# Patient Record
Sex: Female | Born: 1935 | Race: White | Hispanic: No | Smoking: Former smoker
Health system: Southern US, Community
[De-identification: ages and names within clinical notes are randomized; demographics above are authoritative.]

## PROBLEM LIST (undated history)

## (undated) DIAGNOSIS — R413 Other amnesia: Secondary | ICD-10-CM

## (undated) DIAGNOSIS — C801 Malignant (primary) neoplasm, unspecified: Secondary | ICD-10-CM

## (undated) DIAGNOSIS — H9190 Unspecified hearing loss, unspecified ear: Secondary | ICD-10-CM

## (undated) DIAGNOSIS — K589 Irritable bowel syndrome without diarrhea: Secondary | ICD-10-CM

## (undated) DIAGNOSIS — F419 Anxiety disorder, unspecified: Secondary | ICD-10-CM

## (undated) DIAGNOSIS — F329 Major depressive disorder, single episode, unspecified: Secondary | ICD-10-CM

## (undated) DIAGNOSIS — M810 Age-related osteoporosis without current pathological fracture: Secondary | ICD-10-CM

## (undated) DIAGNOSIS — I10 Essential (primary) hypertension: Secondary | ICD-10-CM

## (undated) DIAGNOSIS — C50919 Malignant neoplasm of unspecified site of unspecified female breast: Secondary | ICD-10-CM

## (undated) DIAGNOSIS — F32A Depression, unspecified: Secondary | ICD-10-CM

## (undated) DIAGNOSIS — M1990 Unspecified osteoarthritis, unspecified site: Secondary | ICD-10-CM

## (undated) DIAGNOSIS — Z972 Presence of dental prosthetic device (complete) (partial): Secondary | ICD-10-CM

## (undated) DIAGNOSIS — Z923 Personal history of irradiation: Secondary | ICD-10-CM

## (undated) DIAGNOSIS — E785 Hyperlipidemia, unspecified: Secondary | ICD-10-CM

## (undated) HISTORY — DX: Major depressive disorder, single episode, unspecified: F32.9

## (undated) HISTORY — PX: COLON SURGERY: SHX602

## (undated) HISTORY — DX: Depression, unspecified: F32.A

## (undated) HISTORY — DX: Malignant (primary) neoplasm, unspecified: C80.1

## (undated) HISTORY — DX: Essential (primary) hypertension: I10

## (undated) HISTORY — PX: BREAST SURGERY: SHX581

## (undated) HISTORY — PX: TUBAL LIGATION: SHX77

## (undated) HISTORY — PX: APPENDECTOMY: SHX54

## (undated) HISTORY — DX: Anxiety disorder, unspecified: F41.9

---

## 2004-11-30 ENCOUNTER — Ambulatory Visit: Payer: Self-pay | Admitting: Family Medicine

## 2007-02-17 ENCOUNTER — Ambulatory Visit: Payer: Self-pay | Admitting: Family Medicine

## 2007-03-17 ENCOUNTER — Emergency Department: Payer: Self-pay | Admitting: Emergency Medicine

## 2007-03-18 ENCOUNTER — Other Ambulatory Visit: Payer: Self-pay

## 2007-09-10 ENCOUNTER — Ambulatory Visit: Payer: Self-pay | Admitting: Family Medicine

## 2008-04-15 ENCOUNTER — Emergency Department: Payer: Self-pay | Admitting: Emergency Medicine

## 2009-07-20 ENCOUNTER — Ambulatory Visit: Payer: Self-pay | Admitting: Ophthalmology

## 2009-08-02 ENCOUNTER — Ambulatory Visit: Payer: Self-pay | Admitting: Ophthalmology

## 2009-11-18 ENCOUNTER — Observation Stay: Payer: Self-pay | Admitting: Internal Medicine

## 2010-01-05 ENCOUNTER — Ambulatory Visit: Payer: Self-pay | Admitting: Internal Medicine

## 2010-02-04 ENCOUNTER — Emergency Department: Payer: Self-pay | Admitting: Unknown Physician Specialty

## 2010-02-14 ENCOUNTER — Ambulatory Visit: Payer: Self-pay | Admitting: Internal Medicine

## 2010-02-22 ENCOUNTER — Observation Stay: Payer: Self-pay | Admitting: Internal Medicine

## 2010-03-16 ENCOUNTER — Ambulatory Visit: Payer: Self-pay | Admitting: Vascular Surgery

## 2010-04-05 ENCOUNTER — Ambulatory Visit: Payer: Self-pay | Admitting: Vascular Surgery

## 2010-04-12 ENCOUNTER — Inpatient Hospital Stay: Payer: Self-pay | Admitting: Vascular Surgery

## 2010-04-13 LAB — PATHOLOGY REPORT

## 2010-04-14 ENCOUNTER — Observation Stay: Payer: Self-pay | Admitting: Vascular Surgery

## 2010-06-27 ENCOUNTER — Ambulatory Visit: Payer: Self-pay | Admitting: Family Medicine

## 2010-07-17 ENCOUNTER — Ambulatory Visit: Payer: Self-pay | Admitting: Family Medicine

## 2010-12-04 ENCOUNTER — Ambulatory Visit: Payer: Self-pay | Admitting: Gastroenterology

## 2011-03-05 ENCOUNTER — Ambulatory Visit: Payer: Self-pay | Admitting: Gastroenterology

## 2011-03-06 LAB — PATHOLOGY REPORT

## 2011-03-16 ENCOUNTER — Ambulatory Visit: Payer: Self-pay

## 2011-04-12 ENCOUNTER — Emergency Department: Payer: Self-pay | Admitting: *Deleted

## 2011-05-01 ENCOUNTER — Ambulatory Visit: Payer: Self-pay | Admitting: Surgery

## 2011-05-08 ENCOUNTER — Inpatient Hospital Stay: Payer: Self-pay | Admitting: Surgery

## 2011-05-15 LAB — PATHOLOGY REPORT

## 2012-01-02 ENCOUNTER — Other Ambulatory Visit: Payer: Self-pay | Admitting: Gastroenterology

## 2012-01-02 LAB — CLOSTRIDIUM DIFFICILE BY PCR

## 2012-01-04 LAB — STOOL CULTURE

## 2012-02-18 ENCOUNTER — Ambulatory Visit: Payer: Self-pay | Admitting: Family Medicine

## 2012-02-21 ENCOUNTER — Ambulatory Visit: Payer: Self-pay | Admitting: Family Medicine

## 2012-03-03 ENCOUNTER — Ambulatory Visit: Payer: Self-pay | Admitting: Family Medicine

## 2012-04-11 ENCOUNTER — Other Ambulatory Visit: Payer: Self-pay | Admitting: Gastroenterology

## 2012-09-10 DIAGNOSIS — C50919 Malignant neoplasm of unspecified site of unspecified female breast: Secondary | ICD-10-CM

## 2012-09-10 HISTORY — DX: Malignant neoplasm of unspecified site of unspecified female breast: C50.919

## 2012-09-10 HISTORY — PX: BREAST BIOPSY: SHX20

## 2013-03-18 ENCOUNTER — Ambulatory Visit: Payer: Self-pay | Admitting: Family Medicine

## 2013-03-31 ENCOUNTER — Ambulatory Visit: Payer: Self-pay | Admitting: Surgery

## 2013-04-08 ENCOUNTER — Ambulatory Visit: Payer: Self-pay | Admitting: Oncology

## 2013-04-09 LAB — PATHOLOGY REPORT

## 2013-04-10 ENCOUNTER — Ambulatory Visit: Payer: Self-pay | Admitting: Oncology

## 2013-04-17 ENCOUNTER — Ambulatory Visit: Payer: Self-pay | Admitting: Oncology

## 2013-04-29 ENCOUNTER — Ambulatory Visit: Payer: Self-pay | Admitting: Oncology

## 2013-05-02 ENCOUNTER — Ambulatory Visit: Payer: Self-pay

## 2013-05-02 LAB — URINALYSIS, COMPLETE
Bilirubin,UR: NEGATIVE
Ketone: NEGATIVE
Nitrite: POSITIVE
Ph: 5 (ref 4.5–8.0)
Protein: 100
Specific Gravity: >=1.03 (ref 1.003–1.030)

## 2013-05-11 ENCOUNTER — Ambulatory Visit: Payer: Self-pay | Admitting: Oncology

## 2013-05-11 HISTORY — PX: BREAST LUMPECTOMY: SHX2

## 2013-05-14 ENCOUNTER — Ambulatory Visit: Payer: Self-pay | Admitting: Surgery

## 2013-05-14 LAB — CBC WITH DIFFERENTIAL/PLATELET
Basophil #: 0.1 10*3/uL (ref 0.0–0.1)
Basophil %: 1 %
HGB: 13.7 g/dL (ref 12.0–16.0)
Lymphocyte %: 37.2 %
MCH: 30.3 pg (ref 26.0–34.0)
Monocyte #: 0.8 x10 3/mm (ref 0.2–0.9)
Monocyte %: 10 %
Neutrophil #: 3.8 10*3/uL (ref 1.4–6.5)
Neutrophil %: 46.5 %
Platelet: 284 10*3/uL (ref 150–440)
RBC: 4.53 10*6/uL (ref 3.80–5.20)
RDW: 14.3 % (ref 11.5–14.5)
WBC: 8.1 10*3/uL (ref 3.6–11.0)

## 2013-05-14 LAB — BASIC METABOLIC PANEL
BUN: 13 mg/dL (ref 7–18)
Chloride: 106 mmol/L (ref 98–107)
Co2: 27 mmol/L (ref 21–32)
Creatinine: 0.74 mg/dL (ref 0.60–1.30)
EGFR (Non-African Amer.): >60
Sodium: 138 mmol/L (ref 136–145)

## 2013-05-20 ENCOUNTER — Ambulatory Visit: Payer: Self-pay | Admitting: Surgery

## 2013-06-10 ENCOUNTER — Ambulatory Visit: Payer: Self-pay | Admitting: Oncology

## 2013-07-11 ENCOUNTER — Ambulatory Visit: Payer: Self-pay | Admitting: Oncology

## 2013-07-29 LAB — CBC CANCER CENTER
Basophil %: 1.3 %
Eosinophil #: 0.3 x10 3/mm (ref 0.0–0.7)
HCT: 39.5 % (ref 35.0–47.0)
HGB: 12.9 g/dL (ref 12.0–16.0)
Lymphocyte %: 33.6 %
MCH: 29.7 pg (ref 26.0–34.0)
MCHC: 32.6 g/dL (ref 32.0–36.0)
MCV: 91 fL (ref 80–100)
Monocyte #: 0.6 x10 3/mm (ref 0.2–0.9)
Monocyte %: 9.3 %
Neutrophil #: 3.3 x10 3/mm (ref 1.4–6.5)
Platelet: 257 x10 3/mm (ref 150–440)
RBC: 4.33 10*6/uL (ref 3.80–5.20)
RDW: 13.9 % (ref 11.5–14.5)

## 2013-08-10 ENCOUNTER — Ambulatory Visit: Payer: Self-pay | Admitting: Oncology

## 2013-08-19 LAB — CBC CANCER CENTER
Eosinophil %: 6.6 %
HCT: 37.8 % (ref 35.0–47.0)
HGB: 12.5 g/dL (ref 12.0–16.0)
Lymphocyte #: 1.8 x10 3/mm (ref 1.0–3.6)
Lymphocyte %: 27.7 %
MCH: 29.8 pg (ref 26.0–34.0)
MCHC: 33.2 g/dL (ref 32.0–36.0)
MCV: 90 fL (ref 80–100)
Monocyte #: 0.8 x10 3/mm (ref 0.2–0.9)
Monocyte %: 11.7 %
Neutrophil %: 53.2 %
Platelet: 245 x10 3/mm (ref 150–440)
RBC: 4.21 10*6/uL (ref 3.80–5.20)
RDW: 14 % (ref 11.5–14.5)
WBC: 6.6 x10 3/mm (ref 3.6–11.0)

## 2013-08-26 LAB — CBC CANCER CENTER
Basophil #: 0.2 x10 3/mm — ABNORMAL HIGH (ref 0.0–0.1)
Eosinophil #: 0.4 x10 3/mm (ref 0.0–0.7)
Eosinophil %: 5.7 %
HCT: 39.4 % (ref 35.0–47.0)
HGB: 13.2 g/dL (ref 12.0–16.0)
Lymphocyte %: 26.8 %
MCH: 30.1 pg (ref 26.0–34.0)
MCHC: 33.4 g/dL (ref 32.0–36.0)
MCV: 90 fL (ref 80–100)
Monocyte #: 0.8 x10 3/mm (ref 0.2–0.9)
Neutrophil #: 3.8 x10 3/mm (ref 1.4–6.5)

## 2013-09-02 LAB — CBC CANCER CENTER
Basophil #: 0.1 x10 3/mm (ref 0.0–0.1)
Basophil %: 1.4 %
Eosinophil #: 0.4 x10 3/mm (ref 0.0–0.7)
Eosinophil %: 5.8 %
HCT: 39.5 % (ref 35.0–47.0)
Lymphocyte #: 1.4 x10 3/mm (ref 1.0–3.6)
Lymphocyte %: 22.2 %
MCH: 30.1 pg (ref 26.0–34.0)
MCV: 90 fL (ref 80–100)
Monocyte #: 0.6 x10 3/mm (ref 0.2–0.9)
Neutrophil #: 3.8 x10 3/mm (ref 1.4–6.5)
Neutrophil %: 61.3 %
Platelet: 257 x10 3/mm (ref 150–440)
RBC: 4.4 10*6/uL (ref 3.80–5.20)
WBC: 6.2 x10 3/mm (ref 3.6–11.0)

## 2013-09-10 ENCOUNTER — Ambulatory Visit: Payer: Self-pay | Admitting: Oncology

## 2013-10-11 ENCOUNTER — Ambulatory Visit: Payer: Self-pay | Admitting: Oncology

## 2013-11-26 ENCOUNTER — Emergency Department: Payer: Self-pay | Admitting: Internal Medicine

## 2014-01-11 ENCOUNTER — Ambulatory Visit: Payer: Self-pay | Admitting: Oncology

## 2014-02-08 ENCOUNTER — Ambulatory Visit: Payer: Self-pay | Admitting: Oncology

## 2014-02-22 ENCOUNTER — Emergency Department: Payer: Self-pay | Admitting: Emergency Medicine

## 2014-02-22 LAB — CBC WITH DIFFERENTIAL/PLATELET
Basophil #: 0.1 10*3/uL (ref 0.0–0.1)
Basophil %: 0.8 %
Eosinophil #: 0.1 10*3/uL (ref 0.0–0.7)
Eosinophil %: 1.6 %
HCT: 41.1 % (ref 35.0–47.0)
HGB: 13.6 g/dL (ref 12.0–16.0)
Lymphocyte #: 1.3 10*3/uL (ref 1.0–3.6)
Lymphocyte %: 15.6 %
MCH: 29.6 pg (ref 26.0–34.0)
MCHC: 33.2 g/dL (ref 32.0–36.0)
MCV: 89 fL (ref 80–100)
Monocyte #: 0.6 x10 3/mm (ref 0.2–0.9)
Monocyte %: 6.8 %
NEUTROS PCT: 75.2 %
Neutrophil #: 6.2 10*3/uL (ref 1.4–6.5)
Platelet: 279 10*3/uL (ref 150–440)
RBC: 4.61 10*6/uL (ref 3.80–5.20)
RDW: 14.2 % (ref 11.5–14.5)
WBC: 8.3 10*3/uL (ref 3.6–11.0)

## 2014-02-22 LAB — BASIC METABOLIC PANEL
Anion Gap: 9 (ref 7–16)
BUN: 14 mg/dL (ref 7–18)
CHLORIDE: 103 mmol/L (ref 98–107)
CO2: 27 mmol/L (ref 21–32)
CREATININE: 0.92 mg/dL (ref 0.60–1.30)
Calcium, Total: 9.2 mg/dL (ref 8.5–10.1)
EGFR (African American): >60
EGFR (Non-African Amer.): 60 — ABNORMAL LOW
GLUCOSE: 155 mg/dL — AB (ref 65–99)
Osmolality: 281 (ref 275–301)
Potassium: 4.6 mmol/L (ref 3.5–5.1)
SODIUM: 139 mmol/L (ref 136–145)

## 2014-02-22 LAB — TROPONIN I: Troponin-I: <0.02 ng/mL

## 2014-02-23 LAB — URINALYSIS, COMPLETE
Bacteria: NONE SEEN
Bilirubin,UR: NEGATIVE
Blood: NEGATIVE
Glucose,UR: NEGATIVE mg/dL (ref 0–75)
Ketone: NEGATIVE
LEUKOCYTE ESTERASE: NEGATIVE
Nitrite: NEGATIVE
Ph: 5 (ref 4.5–8.0)
Protein: NEGATIVE
RBC,UR: 1 /HPF (ref 0–5)
SPECIFIC GRAVITY: 1.026 (ref 1.003–1.030)

## 2014-03-18 ENCOUNTER — Ambulatory Visit: Payer: Self-pay | Admitting: Oncology

## 2014-04-14 ENCOUNTER — Ambulatory Visit: Payer: Self-pay | Admitting: Oncology

## 2014-04-29 ENCOUNTER — Inpatient Hospital Stay: Payer: Self-pay | Admitting: Internal Medicine

## 2014-04-29 LAB — URINALYSIS, COMPLETE
BILIRUBIN, UR: NEGATIVE
Blood: NEGATIVE
GLUCOSE, UR: NEGATIVE mg/dL (ref 0–75)
Ketone: NEGATIVE
Nitrite: NEGATIVE
PH: 5 (ref 4.5–8.0)
RBC,UR: 3 /HPF (ref 0–5)
SPECIFIC GRAVITY: 1.023 (ref 1.003–1.030)
WBC UR: 3 /HPF (ref 0–5)

## 2014-04-29 LAB — CBC
HCT: 33.1 % — ABNORMAL LOW (ref 35.0–47.0)
HGB: 11 g/dL — ABNORMAL LOW (ref 12.0–16.0)
MCH: 28.6 pg (ref 26.0–34.0)
MCHC: 33.3 g/dL (ref 32.0–36.0)
MCV: 86 fL (ref 80–100)
Platelet: 396 10*3/uL (ref 150–440)
RBC: 3.85 10*6/uL (ref 3.80–5.20)
RDW: 14.2 % (ref 11.5–14.5)
WBC: 9 10*3/uL (ref 3.6–11.0)

## 2014-04-29 LAB — COMPREHENSIVE METABOLIC PANEL
Albumin: 2.3 g/dL — ABNORMAL LOW (ref 3.4–5.0)
Alkaline Phosphatase: 143 U/L — ABNORMAL HIGH
Anion Gap: 9 (ref 7–16)
BUN: 11 mg/dL (ref 7–18)
Bilirubin,Total: 0.3 mg/dL (ref 0.2–1.0)
CALCIUM: 8.6 mg/dL (ref 8.5–10.1)
CHLORIDE: 105 mmol/L (ref 98–107)
Co2: 25 mmol/L (ref 21–32)
Creatinine: 0.76 mg/dL (ref 0.60–1.30)
EGFR (African American): >60
EGFR (Non-African Amer.): >60
GLUCOSE: 115 mg/dL — AB (ref 65–99)
OSMOLALITY: 278 (ref 275–301)
POTASSIUM: 3.8 mmol/L (ref 3.5–5.1)
SGOT(AST): 36 U/L (ref 15–37)
SGPT (ALT): 53 U/L
SODIUM: 139 mmol/L (ref 136–145)
Total Protein: 7 g/dL (ref 6.4–8.2)

## 2014-04-29 LAB — TROPONIN I

## 2014-04-30 LAB — CBC WITH DIFFERENTIAL/PLATELET
BASOS ABS: 0.1 10*3/uL (ref 0.0–0.1)
Basophil %: 1.2 %
EOS PCT: 5.4 %
Eosinophil #: 0.5 10*3/uL (ref 0.0–0.7)
HCT: 32 % — ABNORMAL LOW (ref 35.0–47.0)
HGB: 10.8 g/dL — AB (ref 12.0–16.0)
Lymphocyte #: 1.2 10*3/uL (ref 1.0–3.6)
Lymphocyte %: 13.8 %
MCH: 28.8 pg (ref 26.0–34.0)
MCHC: 33.8 g/dL (ref 32.0–36.0)
MCV: 85 fL (ref 80–100)
MONO ABS: 1 x10 3/mm — AB (ref 0.2–0.9)
MONOS PCT: 12 %
Neutrophil #: 5.8 10*3/uL (ref 1.4–6.5)
Neutrophil %: 67.6 %
Platelet: 407 10*3/uL (ref 150–440)
RBC: 3.75 10*6/uL — ABNORMAL LOW (ref 3.80–5.20)
RDW: 13.9 % (ref 11.5–14.5)
WBC: 8.5 10*3/uL (ref 3.6–11.0)

## 2014-04-30 LAB — BASIC METABOLIC PANEL
Anion Gap: 9 (ref 7–16)
BUN: 10 mg/dL (ref 7–18)
CALCIUM: 8.5 mg/dL (ref 8.5–10.1)
CHLORIDE: 105 mmol/L (ref 98–107)
CO2: 24 mmol/L (ref 21–32)
CREATININE: 0.63 mg/dL (ref 0.60–1.30)
EGFR (African American): >60
Glucose: 97 mg/dL (ref 65–99)
Osmolality: 275 (ref 275–301)
POTASSIUM: 4.7 mmol/L (ref 3.5–5.1)
Sodium: 138 mmol/L (ref 136–145)

## 2014-05-02 LAB — EXPECTORATED SPUTUM ASSESSMENT W REFEX TO RESP CULTURE

## 2014-05-04 LAB — CULTURE, BLOOD (SINGLE)

## 2014-05-11 ENCOUNTER — Ambulatory Visit: Payer: Self-pay | Admitting: Family Medicine

## 2014-05-11 ENCOUNTER — Ambulatory Visit: Payer: Self-pay | Admitting: Oncology

## 2014-06-10 ENCOUNTER — Ambulatory Visit: Payer: Self-pay | Admitting: Oncology

## 2014-09-28 ENCOUNTER — Ambulatory Visit: Payer: Self-pay | Admitting: Family Medicine

## 2014-09-28 DIAGNOSIS — R918 Other nonspecific abnormal finding of lung field: Secondary | ICD-10-CM | POA: Diagnosis not present

## 2014-09-28 DIAGNOSIS — R05 Cough: Secondary | ICD-10-CM | POA: Diagnosis not present

## 2014-09-28 DIAGNOSIS — I517 Cardiomegaly: Secondary | ICD-10-CM | POA: Diagnosis not present

## 2014-10-13 ENCOUNTER — Ambulatory Visit: Payer: Self-pay | Admitting: Family Medicine

## 2014-10-13 DIAGNOSIS — J984 Other disorders of lung: Secondary | ICD-10-CM | POA: Diagnosis not present

## 2014-10-13 DIAGNOSIS — R918 Other nonspecific abnormal finding of lung field: Secondary | ICD-10-CM | POA: Diagnosis not present

## 2014-10-13 DIAGNOSIS — I700 Atherosclerosis of aorta: Secondary | ICD-10-CM | POA: Diagnosis not present

## 2014-10-13 DIAGNOSIS — R05 Cough: Secondary | ICD-10-CM | POA: Diagnosis not present

## 2014-10-13 DIAGNOSIS — Z01812 Encounter for preprocedural laboratory examination: Secondary | ICD-10-CM | POA: Diagnosis not present

## 2014-10-13 LAB — CREATININE, SERUM
Creatinine: 1.06 mg/dL (ref 0.60–1.30)
EGFR (African American): >60
EGFR (Non-African Amer.): 53 — ABNORMAL LOW

## 2014-10-15 ENCOUNTER — Ambulatory Visit: Payer: Self-pay | Admitting: Internal Medicine

## 2014-10-15 DIAGNOSIS — R918 Other nonspecific abnormal finding of lung field: Secondary | ICD-10-CM | POA: Diagnosis not present

## 2014-10-15 DIAGNOSIS — F419 Anxiety disorder, unspecified: Secondary | ICD-10-CM | POA: Diagnosis not present

## 2014-10-15 DIAGNOSIS — J439 Emphysema, unspecified: Secondary | ICD-10-CM | POA: Diagnosis not present

## 2014-10-15 DIAGNOSIS — C50911 Malignant neoplasm of unspecified site of right female breast: Secondary | ICD-10-CM | POA: Diagnosis not present

## 2014-10-15 DIAGNOSIS — Z170 Estrogen receptor positive status [ER+]: Secondary | ICD-10-CM | POA: Diagnosis not present

## 2014-10-15 DIAGNOSIS — Z79899 Other long term (current) drug therapy: Secondary | ICD-10-CM | POA: Diagnosis not present

## 2014-10-15 DIAGNOSIS — Z79811 Long term (current) use of aromatase inhibitors: Secondary | ICD-10-CM | POA: Diagnosis not present

## 2014-10-15 DIAGNOSIS — C50912 Malignant neoplasm of unspecified site of left female breast: Secondary | ICD-10-CM | POA: Diagnosis not present

## 2014-10-15 DIAGNOSIS — I10 Essential (primary) hypertension: Secondary | ICD-10-CM | POA: Diagnosis not present

## 2014-10-15 LAB — CBC CANCER CENTER
BASOS ABS: 0.1 x10 3/mm (ref 0.0–0.1)
Basophil %: 0.7 %
EOS PCT: 4.5 %
Eosinophil #: 0.4 x10 3/mm (ref 0.0–0.7)
HCT: 36.7 % (ref 35.0–47.0)
HGB: 11.8 g/dL — AB (ref 12.0–16.0)
LYMPHS PCT: 30.8 %
Lymphocyte #: 2.9 x10 3/mm (ref 1.0–3.6)
MCH: 25 pg — ABNORMAL LOW (ref 26.0–34.0)
MCHC: 32.2 g/dL (ref 32.0–36.0)
MCV: 78 fL — AB (ref 80–100)
MONOS PCT: 7.1 %
Monocyte #: 0.7 x10 3/mm (ref 0.2–0.9)
NEUTROS PCT: 56.9 %
Neutrophil #: 5.3 x10 3/mm (ref 1.4–6.5)
PLATELETS: 328 x10 3/mm (ref 150–440)
RBC: 4.73 10*6/uL (ref 3.80–5.20)
RDW: 18.2 % — ABNORMAL HIGH (ref 11.5–14.5)
WBC: 9.4 x10 3/mm (ref 3.6–11.0)

## 2014-10-15 LAB — PROTIME-INR
INR: 1
Prothrombin Time: 12.7 secs

## 2014-10-16 LAB — CANCER ANTIGEN 27.29: CA 27.29: 22.9 U/mL (ref 0.0–38.6)

## 2014-10-20 ENCOUNTER — Ambulatory Visit: Payer: Self-pay | Admitting: Internal Medicine

## 2014-10-20 DIAGNOSIS — J984 Other disorders of lung: Secondary | ICD-10-CM | POA: Diagnosis not present

## 2014-10-20 DIAGNOSIS — Z853 Personal history of malignant neoplasm of breast: Secondary | ICD-10-CM | POA: Diagnosis not present

## 2014-10-20 DIAGNOSIS — R590 Localized enlarged lymph nodes: Secondary | ICD-10-CM | POA: Diagnosis not present

## 2014-10-20 DIAGNOSIS — R918 Other nonspecific abnormal finding of lung field: Secondary | ICD-10-CM | POA: Diagnosis not present

## 2014-10-20 DIAGNOSIS — K8020 Calculus of gallbladder without cholecystitis without obstruction: Secondary | ICD-10-CM | POA: Diagnosis not present

## 2014-10-20 DIAGNOSIS — C50919 Malignant neoplasm of unspecified site of unspecified female breast: Secondary | ICD-10-CM | POA: Diagnosis not present

## 2014-10-22 DIAGNOSIS — C50911 Malignant neoplasm of unspecified site of right female breast: Secondary | ICD-10-CM | POA: Diagnosis not present

## 2014-10-22 DIAGNOSIS — Z79811 Long term (current) use of aromatase inhibitors: Secondary | ICD-10-CM | POA: Diagnosis not present

## 2014-10-22 DIAGNOSIS — I10 Essential (primary) hypertension: Secondary | ICD-10-CM | POA: Diagnosis not present

## 2014-10-22 DIAGNOSIS — Z170 Estrogen receptor positive status [ER+]: Secondary | ICD-10-CM | POA: Diagnosis not present

## 2014-10-22 DIAGNOSIS — R918 Other nonspecific abnormal finding of lung field: Secondary | ICD-10-CM | POA: Diagnosis not present

## 2014-10-22 DIAGNOSIS — J439 Emphysema, unspecified: Secondary | ICD-10-CM | POA: Diagnosis not present

## 2014-10-22 DIAGNOSIS — F419 Anxiety disorder, unspecified: Secondary | ICD-10-CM | POA: Diagnosis not present

## 2014-10-22 DIAGNOSIS — C50912 Malignant neoplasm of unspecified site of left female breast: Secondary | ICD-10-CM | POA: Diagnosis not present

## 2014-10-22 DIAGNOSIS — Z79899 Other long term (current) drug therapy: Secondary | ICD-10-CM | POA: Diagnosis not present

## 2014-11-01 DIAGNOSIS — Z170 Estrogen receptor positive status [ER+]: Secondary | ICD-10-CM | POA: Diagnosis not present

## 2014-11-01 DIAGNOSIS — J439 Emphysema, unspecified: Secondary | ICD-10-CM | POA: Diagnosis not present

## 2014-11-01 DIAGNOSIS — F419 Anxiety disorder, unspecified: Secondary | ICD-10-CM | POA: Diagnosis not present

## 2014-11-01 DIAGNOSIS — Z79899 Other long term (current) drug therapy: Secondary | ICD-10-CM | POA: Diagnosis not present

## 2014-11-01 DIAGNOSIS — C50911 Malignant neoplasm of unspecified site of right female breast: Secondary | ICD-10-CM | POA: Diagnosis not present

## 2014-11-01 DIAGNOSIS — C50912 Malignant neoplasm of unspecified site of left female breast: Secondary | ICD-10-CM | POA: Diagnosis not present

## 2014-11-01 DIAGNOSIS — I10 Essential (primary) hypertension: Secondary | ICD-10-CM | POA: Diagnosis not present

## 2014-11-01 DIAGNOSIS — Z79811 Long term (current) use of aromatase inhibitors: Secondary | ICD-10-CM | POA: Diagnosis not present

## 2014-11-01 DIAGNOSIS — R918 Other nonspecific abnormal finding of lung field: Secondary | ICD-10-CM | POA: Diagnosis not present

## 2014-11-02 ENCOUNTER — Ambulatory Visit: Payer: Self-pay | Admitting: Oncology

## 2014-11-02 DIAGNOSIS — J8489 Other specified interstitial pulmonary diseases: Secondary | ICD-10-CM | POA: Diagnosis not present

## 2014-11-02 DIAGNOSIS — Z91018 Allergy to other foods: Secondary | ICD-10-CM | POA: Diagnosis not present

## 2014-11-02 DIAGNOSIS — Z853 Personal history of malignant neoplasm of breast: Secondary | ICD-10-CM | POA: Diagnosis not present

## 2014-11-02 DIAGNOSIS — Z9889 Other specified postprocedural states: Secondary | ICD-10-CM | POA: Diagnosis not present

## 2014-11-02 DIAGNOSIS — Z91013 Allergy to seafood: Secondary | ICD-10-CM | POA: Diagnosis not present

## 2014-11-02 DIAGNOSIS — Z882 Allergy status to sulfonamides status: Secondary | ICD-10-CM | POA: Diagnosis not present

## 2014-11-02 DIAGNOSIS — R918 Other nonspecific abnormal finding of lung field: Secondary | ICD-10-CM | POA: Diagnosis not present

## 2014-11-02 DIAGNOSIS — Z9849 Cataract extraction status, unspecified eye: Secondary | ICD-10-CM | POA: Diagnosis not present

## 2014-11-02 DIAGNOSIS — R911 Solitary pulmonary nodule: Secondary | ICD-10-CM | POA: Diagnosis not present

## 2014-11-02 DIAGNOSIS — I10 Essential (primary) hypertension: Secondary | ICD-10-CM | POA: Diagnosis not present

## 2014-11-02 DIAGNOSIS — J439 Emphysema, unspecified: Secondary | ICD-10-CM | POA: Diagnosis not present

## 2014-11-02 DIAGNOSIS — Z880 Allergy status to penicillin: Secondary | ICD-10-CM | POA: Diagnosis not present

## 2014-11-02 DIAGNOSIS — J8410 Pulmonary fibrosis, unspecified: Secondary | ICD-10-CM | POA: Diagnosis not present

## 2014-11-05 DIAGNOSIS — C50912 Malignant neoplasm of unspecified site of left female breast: Secondary | ICD-10-CM | POA: Diagnosis not present

## 2014-11-05 DIAGNOSIS — F419 Anxiety disorder, unspecified: Secondary | ICD-10-CM | POA: Diagnosis not present

## 2014-11-05 DIAGNOSIS — Z170 Estrogen receptor positive status [ER+]: Secondary | ICD-10-CM | POA: Diagnosis not present

## 2014-11-05 DIAGNOSIS — R918 Other nonspecific abnormal finding of lung field: Secondary | ICD-10-CM | POA: Diagnosis not present

## 2014-11-05 DIAGNOSIS — Z79899 Other long term (current) drug therapy: Secondary | ICD-10-CM | POA: Diagnosis not present

## 2014-11-05 DIAGNOSIS — Z79811 Long term (current) use of aromatase inhibitors: Secondary | ICD-10-CM | POA: Diagnosis not present

## 2014-11-05 DIAGNOSIS — J439 Emphysema, unspecified: Secondary | ICD-10-CM | POA: Diagnosis not present

## 2014-11-05 DIAGNOSIS — I10 Essential (primary) hypertension: Secondary | ICD-10-CM | POA: Diagnosis not present

## 2014-11-05 DIAGNOSIS — C50911 Malignant neoplasm of unspecified site of right female breast: Secondary | ICD-10-CM | POA: Diagnosis not present

## 2014-11-09 ENCOUNTER — Ambulatory Visit
Admit: 2014-11-09 | Disposition: A | Discharge status: Home / Self Care | Payer: Self-pay | Attending: Internal Medicine | Admitting: Internal Medicine

## 2014-11-09 ENCOUNTER — Ambulatory Visit
Admit: 2014-11-09 | Disposition: A | Discharge status: Home / Self Care | Payer: Self-pay | Attending: Oncology | Admitting: Oncology

## 2014-12-24 ENCOUNTER — Other Ambulatory Visit: Payer: Self-pay | Admitting: Oncology

## 2014-12-24 DIAGNOSIS — Z853 Personal history of malignant neoplasm of breast: Secondary | ICD-10-CM

## 2014-12-31 NOTE — Op Note (Signed)
PATIENT NAME:  Veronica Ellis, Veronica Ellis MR#:  [id number] DATE OF BIRTH:  1934-04-20  DATE OF PROCEDURE:  05/20/2013  PREOPERATIVE DIAGNOSIS: Bilateral breast cancer.   POSTOPERATIVE DIAGNOSIS: Bilateral breast cancer.  OPERATION: Bilateral partial mastectomy with sentinel lymph node dissections.   ANESTHESIA: General.   SURGEON: Rodena Goldmann, III, MD   ASSISTANT: Gerilyn Nestle, [place] student, Tamera Punt, [place] student.   OPERATIVE PROCEDURE: With the patient in the supine position after induction of appropriate general anesthesia, the patient's left breast was prepped and draped with sterile towels. A ChloraPrep was utilized. The axilla was interrogated with the Neoprobe and the area suspected to contain the sentinel lymph node was identified. This area was incised, carried down through the subcutaneous tissue using Bovie electrocautery. The axilla was entered and the lymph node identified with the Neoprobe. Counts as high as 500 were identified with background counts of less than 25. Lymph node was dissected free and actually contained 2 lymph nodes. It was sent to pathology where touch preps demonstrated no macrometastasis. An elliptical incision was made around the needle placement in the left lower outer quadrant. The incision was carried down through the subcutaneous tissue with Bovie electrocautery. The dissection was taken straight down to the chest wall and off the chest wall, not exposing any portion of the wire. The specimen was sent for pathologic evaluation. The margin in the inferior portion did appear to be close to the skin, and so after consulting with the pathologist, this area was re-excised and returned to pathology appropriately marked. The area was irrigated. The incisions were closed with 4-0 nylon running subcuticular suture in the axilla and 3-0 running subcuticular suture, Vicryl, in the lower outer quadrant. The area was dressed sterilely. Attention was then turned to the right side where,  similarly, the breast was prepped with ChloraPrep and draped with sterile towels. The axilla was appropriately positioned and interrogated. The area identified by the Neoprobe was incised, carried down into the axillary tissue. Another large lymph node was identified which turned out to encompass 2 lymph nodes. These were sent after specimen counts of close to 600 over background counts of less than 50. The specimen was sent for pathologic evaluation, and again touch prep revealed no evidence of any macrometastasis. The area was copiously irrigated and packed. An elliptical incision was made around the specimen in the upper outer quadrant as identified by the needle placement. The specimen was taken down to the chest wall and again in a straight line fashion dissected back to the wire. The wire was not exposed at any point. The specimen was sent for mammography which demonstrated a lesion in the center portion of the specimen. The lesion was then identified at pathology with gross evidence of good margins. The area was irrigated. Because of the larger dissection, a 7 mm flat Jackson-Pratt drain was inserted through the lower outer quadrant and into the defect left by the surgery. The closure was performed with a running subcuticular suture of 3-0 Vicryl. Benzoin and Steri-Strips were applied. Sterile dressings were applied. The patient was returned to the recovery room having tolerated the procedure well. Sponge, instrument and needle counts were correct x 2 in the operating room.   ____________________________ Rodena Goldmann III, MD rle:cb D: 05/20/2013 16:20:05 ET T: 05/20/2013 17:26:34 ET JOB#: [id number]  cc: Micheline Maze, MD, <Dictator> Juline Patch, MD Rodena Goldmann MD ELECTRONICALLY SIGNED 05/25/2013 19:20

## 2014-12-31 NOTE — Consult Note (Signed)
Reason for Visit: This 81 year old Female patient presents to the clinic for initial evaluation of  bilateral breast cancer .   Referred by Dr. Grayland Ormond.  Diagnosis:  Chief Complaint/Diagnosis   patient is a 81 year old female who presented with abnormal mammograms and ultrasound found have bilateral early stage breast cancer both T1 C. N0 lesions status post wide local excision and sentinel node biopsies bilaterally. Both tumors T1 C. ER/PR positive HER-2/neu not overexpressed with Oncotype DX showing low intermediate risk of recurrence. Patient will receive aromatase inhibitor. Medical oncology has deferred chemotherapy. Patient will receive bilateral breast radiation  Pathology Report pathology reports reviewed   Imaging Report mammograms and ultrasounds reviewed   Referral Report clinical notes reviewed   Planned Treatment Regimen bilateral external beam radiation therapy to her whole breast   HPI   patient is a 81 year old female who presented with abnormal bilateral mammograms showing abnormal highly suspicious lesions in the upper outer aspect of both breasts. Needle localization confirmed invasive ductal carcinoma bilaterally and patient will wide local excision and sentinel node evaluation bilaterally. Left breast for a 1.7 cm grade 1 invasive mammary carcinoma with margins clear. 3 sentinel lymph nodes all were negative for metastatic disease. Tumor was strongly ER/PR positive HER-2/neu not overexpressed. Right breast for a 1.1 cm grade 2 invasive mammary carcinoma margins clear to sentinel lymph nodes were free of metastatic disease tumor was ER/PR positive. Oncotype DX was performed on both specimens and both showed low intermediate risk of recurrence. After discussion with medical oncology patient is deferred for chemotherapy. She will be on aromatase inhibitor after completion of radiation. At the present time she's doing well healing up nicely. She specifically denies breast  tenderness cough or bone pain.  Past Hx:    hypertension:    emphysema:    blockage:    Cataract Surgery:    Tubal Ligation:    Appendectomy:   Past, Family and Social History:  Past Medical History positive   Cardiovascular hypertension   Respiratory emphysema   Past Surgical History appendectomy; cataract surgery, tubal ligation, partial colectomy for benign disease   Family History positive   Family History Comments to those with premenopausal breast cancer   Social History noncontributory   Additional Past Medical and Surgical History seen accompanied by multiple family members and her friend today as well as Art therapist   Allergies:   PCN: Rash  Iodine: N/V/Diarrhea, Hives  Sulfa drugs: Rash  Shellfish: Hives, N/V/Diarrhea  Other -Explain in Comment: Unknown  Home Meds:  Home Medications: Medication Instructions Status  carvedilol 3.125 mg oral tablet 1 tab(s) orally once a day@ 2pm Active  lisinopril 5 mg oral tablet 1 tab(s) orally once a day @ 2pm Active  lovastatin 20 mg oral tablet 1 tab(s) orally once a day @ 2pm Active  omeprazole 20 mg oral delayed release capsule 1 cap(s) orally once a day, As Needed Active  hyoscyamine 0.125 mg oral tablet 1 tab(s) orally once a day (in the morning) Active  albuterol CFC free 90 mcg/inh inhalation aerosol 2 puff(s) inhaled 4 times a day, As Needed Active  aspirin 325 mg oral tablet 2 tab(s) orally once (at bedtime) Active  Co-Q10 1 tab(s) orally once a day midday Active  sertraline 50 mg oral tablet 2 tab(s) orally once a day Active   Review of Systems:  General negative   Performance Status (ECOG) 0   Skin negative   Breast see HPI   Ophthalmologic negative  ENMT negative   Respiratory and Thorax negative   Cardiovascular negative   Gastrointestinal negative   Genitourinary negative   Musculoskeletal negative   Neurological negative   Psychiatric negative   Hematology/Lymphatics  negative   Endocrine negative   Allergic/Immunologic negative   Review of Systems   review of systems compiled by nursing notes  Nursing Notes:  Nursing Vital Signs and Chemo Nursing Nursing Notes: *CC Vital Signs Flowsheet:   24-Oct-14 09:16  Temp Temperature 98.5  Pulse Pulse 71  Respirations Respirations 16  SBP SBP 148  DBP DBP 61  Pain Scale (0-10)  0  Current Weight (kg) (kg) 74.8  Height (cm) centimeters 166  BSA (m2) 1.8   Physical Exam:  General/Skin/HEENT:  General normal   Skin normal   Eyes normal   ENMT normal   Head and Neck normal   Additional PE well-developed female in NAD. She status post bilateral partial mastectomies. Both incisions are in a linear fashion causing some retraction of the breast towards the scar. No dominant mass or nodularity is noted in either breast into position examined. No axillary or supraclavicular adenopathy is appreciated. Lungs are clear to A&P cardiac examination shows regular rate and rhythm.   Breasts/Resp/CV/GI/GU:  Respiratory and Thorax normal   Cardiovascular normal   Gastrointestinal normal   Genitourinary normal   MS/Neuro/Psych/Lymph:  Musculoskeletal normal   Neurological normal   Lymphatics normal   Other Results:  Radiology Results: [place]:    09-Jul-14 11:31, US Breast Bilateral  US Breast Bilateral   REASON FOR EXAM:    LT BRST NODULE 4-5 OCLOCK AND DENSITY UOQ RT  COMMENTS:       PROCEDURE: [place]  - US BREAST BILATERAL  - Mar 18 2013 11:31AM     RESULT: Comparison made to mammogram of 03/18/2013 and prior mammograms   dated to 02/17/2007. At 10:00 in the right breast is a ill-defined 1.2 cm   solid mass within shadowing suspicious for malignancy and correlating to    mammographic finding.    In the left breast at for clot is a 1.4 cm ill-defined mass with   shadowing. This is suspicious for malignancy.    IMPRESSION:  Findings consistent with bilateral breast cancers. Surgical   consultation  suggested.  BI-RADS: Category 5 - Highly Suggestive of Malignancy - Appropriate   Action Should Be Taken      Thank you for the oppurtunity to contribute to the care of your patient.        Verified By: Osa Craver, M.D., MD  LabUnknown:    09-Jul-14 10:21, Digital Diagnostic Mammogram Bilateral  PACS Image     09-Jul-14 11:31, US Breast Bilateral  PACS [place]:    09-Jul-14 10:21, Digital Diagnostic Mammogram Bilateral  Digital Diagnostic Mammogram Bilateral   REASON FOR EXAM:    LT BRST NODULE 4-5 OCLOCK AND DENSITY UOQ RT  COMMENTS:       PROCEDURE: MAM - MAM DGTL DIAGNOSTIC MAMMO W/CAD  - Mar 18 2013 10:21AM     RESULT:  Comparison made to prior studies dating to 02/17/2007 and    ultrasound obtained on today's date. Ill-defined stellate density is   present in the outer aspect left breast. This is solid and shadowing by   ultrasound and highly suspicious for malignancy. There is a density in   the upper outer aspect of the right breast. This is solidand shadowing   by ultrasound and highly suspicious  for malignancy. These findings are   new from prior studies. Benign calcification noted bilaterally. CAD   evaluation positive for the above-described findings. Report phoned   patient's physician.  IMPRESSION:  Findings consistent with bilateral breast cancer. Surgical   evaluation is suggested.    BI-RADS: Category 5 - Highly Suggestive of Malignancy - Appropriate   Action Should Be Taken      Thank you for the oppurtunity to contribute to thecare of your patient. Marland Kitchen      BREAST COMPOSITION: The breast composition is HETEROGENEOUSLY DENSE   (glandular tissue is 51-75%) This may decrease the sensitivity of   mammography.      Verified By: Osa Craver, M.D., MD   Relevent Results:   Relevant Scans and Labs mammograms ultrasound were reviewed   Assessment and Plan: Impression:   right breast invasive mammary carcinoma stage I (T1 C. N0  M0) ER/PR positive breast stage I invasive mammary carcinoma (T1 C. N0 M0) ER/PR positive status post partial mastectomy and sentinel node biopsies Plan:   at this time would like to go ahead with whole breast radiation bilaterally. Would plan on delivering5000 cGy over 5 weeks and boost to her scar another 1400 cGy using electron beam. Risks and benefits of treatment including skin reaction, fatigue, alteration blood counts and possible increased severity side effects based on bilateral breast cancer treatment at the same time were all explained in detail to the patient. She seems to comprehend my treatment plan well. I discussed the case personally with Dr. Grayland Ormond who will the third chemotherapy. Patient will be candidate for aromatase inhibitor therapy after completion of radiation. I have set her up for CT simulation in about a week's time.  I would like to take this opportunity to thank you for allowing me to continue to participate in this patient's care.  CC Referral:  cc: Dr. Pat Patrick, Dr. Otilio Miu   Electronic Signatures: Baruch Gouty, Roda Shutters (MD)  (Signed 30-Oct-14 12:13)  Authored: HPI, Diagnosis, Past Hx, PFSH, Allergies, Home Meds, ROS, Nursing Notes, Physical Exam, Other Results, Relevent Results, Encounter Assessment and Plan, CC Referring Physician   Last Updated: 30-Oct-14 12:13 by Armstead Peaks (MD)

## 2015-01-01 NOTE — Discharge Summary (Signed)
PATIENT NAME:  Veronica Ellis, Veronica Ellis MR#:  [id number] DATE OF BIRTH:  07-Aug-1934  DATE OF ADMISSION:  04/29/2014 DATE OF DISCHARGE:  04/30/2014  DISCHARGE DIAGNOSES:  1.  Bilateral community-acquired pneumonia.  2.  Chronic obstructive pulmonary disease.  3.  Musculoskeletal chest and back pain.  4.  Tobacco abuse.   DISCHARGE MEDICATIONS:  1.  Sertraline 50 mg 2 tablets oral once a day.  2.  Hyoscyamine 0.125 mg oral once a day.  3.  Metrazol 2.5 mg daily.  4.  Coreg 3.125 mg 2 times a day.  5.  Levaquin 500 mg daily for 1 week.  6.  ProAir HFA 2 puffs inhaled 4 times a day as needed.  7.  Tussionex 5 mL oral 2 times a day as needed for cough.  8.  Advil 200 mg 2 tablets oral 3 times a day as needed for pain for 5 days.   DISCHARGE INSTRUCTIONS: Low-sodium, regular consistency diet. Activity as tolerated. Follow up with primary care physician within a week, and the patient has been counseled for greater than 3 minutes to quit smoking on the day of discharge.   IMAGING STUDIES: Chest x-ray, portable, which showed bilateral infiltrates. No effusion. No edema.   ADMITTING HISTORY AND PHYSICAL: Please see detailed H and P dictated by Dr. Verdell Carmine. In brief, a 81 year old female patient with history of COPD and ongoing tobacco abuse, who presented to the hospital complaining of some back and chest pain. She was found to have bilateral pneumonia and admitted to hospitalist service.   HOSPITAL COURSE:  1.  Bilateral pneumonia. The patient was started on IV Levaquin. She has been afebrile with normal white count, not needing oxygen by day of discharge. She does continue to have some mild pleuritic chest and back pain, which is improved, and will be discharged home in a stable condition on oral Levaquin for 1 more week, along with cough syrup and ibuprofen p.r.n.  The patient has been counseled to quit smoking.   2.  Chronic obstructive pulmonary disease.  This seems stable. She does not have any  wheezing, no shortness of breath.   Prior to discharge, the patient's lungs sound clear with normal work of breathing. S1, S2 heard without any murmurs. Abdomen soft.  No edema found.  TIME SPENT ON DAY OF DISCHARGE IN DISCHARGE ACTIVITY: 35 minutes.    ____________________________ Leia Alf Dantrell Schertzer, MD srs:TT D: 04/30/2014 14:30:07 ET T: 04/30/2014 17:14:18 ET JOB#: [id number]  cc: Alveta Heimlich R. Paytin Ramakrishnan, MD, <Dictator> Neita Carp MD ELECTRONICALLY SIGNED 05/25/2014 16:29

## 2015-01-01 NOTE — H&P (Signed)
PATIENT NAME:  Veronica Ellis, OATIS MR#:  [id number] DATE OF BIRTH:  [birth year]  DATE OF ADMISSION:  04/29/2014  PRIMARY CARE PHYSICIAN: Dr. Otilio Miu.   CHIEF COMPLAINT: Chest pain.   HISTORY OF PRESENT ILLNESS: This is a 81 year old female who complains of chest pain, midsternal, non-radiating, associated with some chills, but no documented fever. The patient went to see her primary care physician due to ongoing chest pain and productive sputum, and he sent her over to the ER for further evaluation. The patient's chest x-ray did show multifocal pneumonia. Hospitalist services were contacted for further treatment and evaluation. The patient denies any documented fever, but admits to riggers, positive cough with sputum production, green in color. No nausea. No vomiting. No abdominal pain. No sick contacts and no other associated symptoms.   REVIEW OF SYSTEMS:  CONSTITUTIONAL: No documented fever. No weight gain, no weight loss.  EYES: No blurred or double vision.  EARS, NOSE, AND THROAT: No tinnitus. No postnasal drip. No redness of the oropharynx.  RESPIRATORY: Positive cough. No wheeze, no hemoptysis. Positive COPD.  CARDIOVASCULAR: Positive chest pain, no orthopnea, palpitations, or syncope.  GASTROINTESTINAL: No nausea, no vomiting or diarrhea. No abdominal pain. No melena or hematochezia.  GENITOURINARY: No dysuria or hematuria.  ENDOCRINE: No polyuria or nocturia. No heat or cold intolerance.  HEMATOLOGIC: No anemia, no bruising, no bleeding.  INTEGUMENTARY: No rashes. No lesions.  MUSCULOSKELETAL: No arthritis, no swelling, no gout.  NEUROLOGIC: No numbness, tingling or ataxia. No seizure activity.  PSYCHIATRIC: No anxiety or insomnia. No ADD. Positive depression.   PAST MEDICAL HISTORY: Consistent with history of breast cancer, dilated cardiomyopathy, depression, COPD with ongoing tobacco abuse.   ALLERGIES: IODINE, PENICILLIN, AND SULFA DRUGS.   SOCIAL HISTORY: Does smoke still  about a pack per day, has been smoking last 30+ years. Occasional alcohol use. No illicit drug abuse. Lives at home with her husband.   FAMILY HISTORY: Mother and father are both deceased. Father died from lung cancer. Mother died from cirrhosis of the liver.   CURRENT MEDICATIONS: As follows: Coreg 3.125 mg b.i.d. hyoscyamine 0.125 mg daily, electrosol 2.5 mg daily, Zoloft 100 mg daily.   PHYSICAL EXAMINATION: Presently is as follows:  VITAL SIGNS:  Temperature is 99.1, pulse 70, respirations 20, blood pressure 95/68, saturations 94% on room air.  GENERAL: She is a pleasant -appearing female in no apparent distress.  HEENT:  Atraumatic, normocephalic. Extraocular muscles are intact. Pupils equal and reactive to light. Sclerae anicteric. No conjunctival injection. No pharyngeal erythema.  NECK: Supple. There is no jugular venous distention. No bruits. No lymphadenopathy or thyromegaly.  HEART: Regular rate and rhythm. No murmurs, no rubs, no clicks.  LUNGS: She has prolonged inspiratory and expiratory phase.  Negative use of accessory muscles. No dullness to percussion.  ABDOMEN: Soft, flat, nontender, nondistended. Has good bowel sounds. No hepatosplenomegaly appreciated.  EXTREMITIES: No evidence of any cyanosis, clubbing, or peripheral edema. Has +2 pedal and radial pulses bilaterally.  NEUROLOGICAL: The patient is alert, awake and oriented x 3 with no focal motor or sensory deficits appreciated bilaterally.  SKIN: Moist and warm with no rashes appreciated.  LYMPHATIC: There is no cervical or axillary lymphadenopathy.   LABORATORY DATA: Serum glucose of 115, BUN 11, creatinine 0.7, sodium 139, potassium 3.8, chloride 105, bicarbonate 25. The patient's LFTs are within normal limits. Albumin is 2.3. Troponin less than 0.02. White cell count 9, hemoglobin 11.0, hematocrit 33.1, platelet count 396,000.   IMAGING: The patient did  have a chest x-ray done which showed areas of bilateral mid-lung and  right lower lobe airspace disease compatible with pneumonia.   ASSESSMENT AND PLAN: This is a 81 year old female with history of breast cancer, dilated cardiomyopathy, depression, chronic obstructive pulmonary disease with ongoing tobacco abuse, who presents to the hospital due to chest pain and noted to have multifocal pneumonia.   1.  Pneumonia: This was likely community-acquired pneumonia, likely cause of the patient's chest pain. We will continue the patient with IV Levaquin, follow sputum and blood cultures. The patient is currently afebrile and hemodynamically stable with a normal white cell count.   2.  Depression. Continue with the Zoloft.  3.  History of dilated cardiomyopathy. Clinically, patient is not in congestive heart failure. Continue with her Coreg.  4.  Chronic obstructive pulmonary disease. No acute exacerbation.  Assess her for home oxygen prior to discharge.  5.  History of breast cancer: Continue with the letrozole.   CODE STATUS: The patient is a FULL CODE.   TIME SPENT ON ADMISSION: 45 minutes.    ____________________________ Belia Heman. Verdell Carmine, MD vjs:TT D: 04/29/2014 14:54:47 ET T: 04/29/2014 15:45:51 ET JOB#: [id number]  cc: Belia Heman. Verdell Carmine, MD, <Dictator> Henreitta Leber MD ELECTRONICALLY SIGNED 05/08/2014 11:46

## 2015-01-03 LAB — SURGICAL PATHOLOGY

## 2015-02-16 ENCOUNTER — Encounter: Payer: Self-pay | Admitting: Family Medicine

## 2015-02-16 ENCOUNTER — Encounter (INDEPENDENT_AMBULATORY_CARE_PROVIDER_SITE_OTHER): Payer: Self-pay

## 2015-02-16 ENCOUNTER — Other Ambulatory Visit: Payer: Self-pay

## 2015-02-16 ENCOUNTER — Ambulatory Visit
Admission: RE | Admit: 2015-02-16 | Discharge: 2015-02-16 | Disposition: A | Discharge status: Home / Self Care | Payer: Commercial Managed Care - HMO | Source: Ambulatory Visit | Attending: Family Medicine | Admitting: Family Medicine

## 2015-02-16 ENCOUNTER — Ambulatory Visit (INDEPENDENT_AMBULATORY_CARE_PROVIDER_SITE_OTHER): Payer: Commercial Managed Care - HMO | Admitting: Family Medicine

## 2015-02-16 VITALS — BP 120/64 | HR 80 | Ht 67.0 in | Wt 160.0 lb

## 2015-02-16 DIAGNOSIS — S060X0A Concussion without loss of consciousness, initial encounter: Secondary | ICD-10-CM | POA: Insufficient documentation

## 2015-02-16 DIAGNOSIS — G319 Degenerative disease of nervous system, unspecified: Secondary | ICD-10-CM | POA: Insufficient documentation

## 2015-02-16 DIAGNOSIS — Z01810 Encounter for preprocedural cardiovascular examination: Secondary | ICD-10-CM | POA: Insufficient documentation

## 2015-02-16 DIAGNOSIS — N63 Unspecified lump in unspecified breast: Secondary | ICD-10-CM | POA: Insufficient documentation

## 2015-02-16 DIAGNOSIS — R51 Headache: Secondary | ICD-10-CM | POA: Diagnosis not present

## 2015-02-16 DIAGNOSIS — I6782 Cerebral ischemia: Secondary | ICD-10-CM | POA: Insufficient documentation

## 2015-02-16 DIAGNOSIS — F419 Anxiety disorder, unspecified: Secondary | ICD-10-CM | POA: Insufficient documentation

## 2015-02-16 DIAGNOSIS — G44321 Chronic post-traumatic headache, intractable: Secondary | ICD-10-CM | POA: Diagnosis not present

## 2015-02-16 DIAGNOSIS — E7849 Other hyperlipidemia: Secondary | ICD-10-CM | POA: Insufficient documentation

## 2015-02-16 DIAGNOSIS — S8011XS Contusion of right lower leg, sequela: Secondary | ICD-10-CM | POA: Diagnosis not present

## 2015-02-16 DIAGNOSIS — Z0000 Encounter for general adult medical examination without abnormal findings: Secondary | ICD-10-CM | POA: Insufficient documentation

## 2015-02-16 DIAGNOSIS — I679 Cerebrovascular disease, unspecified: Secondary | ICD-10-CM | POA: Insufficient documentation

## 2015-02-16 DIAGNOSIS — J432 Centrilobular emphysema: Secondary | ICD-10-CM | POA: Insufficient documentation

## 2015-02-16 DIAGNOSIS — Z853 Personal history of malignant neoplasm of breast: Secondary | ICD-10-CM | POA: Insufficient documentation

## 2015-02-16 DIAGNOSIS — F17201 Nicotine dependence, unspecified, in remission: Secondary | ICD-10-CM | POA: Insufficient documentation

## 2015-02-16 DIAGNOSIS — E871 Hypo-osmolality and hyponatremia: Secondary | ICD-10-CM | POA: Insufficient documentation

## 2015-02-16 DIAGNOSIS — X58XXXA Exposure to other specified factors, initial encounter: Secondary | ICD-10-CM | POA: Insufficient documentation

## 2015-02-16 DIAGNOSIS — H538 Other visual disturbances: Secondary | ICD-10-CM | POA: Diagnosis not present

## 2015-02-16 DIAGNOSIS — F339 Major depressive disorder, recurrent, unspecified: Secondary | ICD-10-CM | POA: Insufficient documentation

## 2015-02-16 NOTE — Addendum Note (Signed)
Addended by: Otilio Miu C on: 02/16/2015 11:00 AM   Modules accepted: Orders

## 2015-02-16 NOTE — Progress Notes (Addendum)
Name: Veronica Ellis   MRN: [id number]    DOB: 1933/11/29   Date:02/16/2015       Progress Note  Subjective  Chief Complaint  Chief Complaint  Patient presents with  . Headache  . Fall    Headache  This is a recurrent problem. The current episode started more than 1 month ago. The problem occurs constantly. The problem has been unchanged. The pain is located in the occipital region. The pain quality is not similar to prior headaches. The quality of the pain is described as aching. The pain is at a severity of 6/10. Associated symptoms include blurred vision, a loss of balance, nausea and a visual change. Pertinent negatives include no abdominal pain, abnormal behavior, back pain, coughing, dizziness, drainage, ear pain, eye pain, eye redness, eye watering, fever, insomnia, muscle aches, neck pain, numbness, phonophobia, photophobia, rhinorrhea, seizures, sinus pressure, sore throat, tingling, tinnitus, vomiting, weakness or weight loss. The symptoms are aggravated by activity and fatigue. She has tried acetaminophen for the symptoms. The treatment provided mild relief. Her past medical history is significant for cancer, hypertension and recent head traumas. There is no history of migraine headaches.  Fall The accident occurred more than 1 week ago. The fall occurred while standing (while stepping on ant). She landed on grass (mulch). The volume of blood lost was minimal. The point of impact was the face and right knee. The pain is present in the right lower leg. The pain is mild. The symptoms are aggravated by ambulation. Associated symptoms include headaches, nausea and a visual change. Pertinent negatives include no abdominal pain, fever, hematuria, numbness, tingling or vomiting. She has tried acetaminophen for the symptoms. The treatment provided moderate relief.    No problem-specific assessment & plan notes found for this encounter.   Past Medical History  Diagnosis Date  .  Hypertension   . Anxiety   . Depression   . Cancer     breast    Past Surgical History  Procedure Laterality Date  . Appendectomy    . Tubal ligation    . Colon surgery      12 in. removed due to polyps    History reviewed. No pertinent family history.  History   Social History  . Marital Status: Widowed    Spouse Name: N/A  . Number of Children: N/A  . Years of Education: N/A   Occupational History  . Not on file.   Social History Main Topics  . Smoking status: Current Every Day Smoker  . Smokeless tobacco: Not on file  . Alcohol Use: 0.0 oz/week    0 Standard drinks or equivalent per week  . Drug Use: No  . Sexual Activity: Yes   Other Topics Concern  . Not on file   Social History Narrative  . No narrative on file    Allergies  Allergen Reactions  . Erythromycin   . Iodine   . Penicillins   . Shellfish Allergy      Review of Systems  Constitutional: Negative for fever, chills, weight loss and malaise/fatigue.  HENT: Negative for ear discharge, ear pain, rhinorrhea, sinus pressure, sore throat and tinnitus.   Eyes: Positive for blurred vision. Negative for photophobia, pain and redness.  Respiratory: Negative for cough, sputum production, shortness of breath and wheezing.   Cardiovascular: Negative for chest pain, palpitations and leg swelling.  Gastrointestinal: Positive for nausea. Negative for heartburn, vomiting, abdominal pain, diarrhea, constipation, blood in stool and melena.  Genitourinary: Negative  for dysuria, urgency, frequency and hematuria.  Musculoskeletal: Negative for myalgias, back pain, joint pain and neck pain.  Skin: Negative for rash.  Neurological: Positive for headaches and loss of balance. Negative for dizziness, tingling, sensory change, focal weakness, seizures, weakness and numbness.  Endo/Heme/Allergies: Negative for environmental allergies and polydipsia. Does not bruise/bleed easily.  Psychiatric/Behavioral: Negative for  depression and suicidal ideas. The patient is not nervous/anxious and does not have insomnia.      Objective  Filed Vitals:   02/16/15 0936 02/16/15 0946  BP:  120/64  Pulse:  80  Height: 5\' 7"  (1.702 m) 5\' 7"  (1.702 m)  Weight: 160 lb (72.576 kg) 160 lb (72.576 kg)    Physical Exam  Constitutional: She appears distressed.  HENT:  Head: Normocephalic and atraumatic.  Right Ear: External ear normal.  Left Ear: External ear normal.  Nose: Nose normal.  Mouth/Throat: Oropharynx is clear and moist.  Eyes: Conjunctivae and EOM are normal. Pupils are equal, round, and reactive to light.  Neck: Normal range of motion. Neck supple. No thyromegaly present.  Cardiovascular: Normal rate, regular rhythm, normal heart sounds and intact distal pulses.   No murmur heard. Pulmonary/Chest: Effort normal and breath sounds normal.  Abdominal: Soft. Bowel sounds are normal. There is no tenderness.  Musculoskeletal: Normal range of motion.  Lymphadenopathy:    She has no cervical adenopathy.  Neurological: She is alert. She has normal sensation, normal strength, normal reflexes and intact cranial nerves. No cranial nerve deficit. Gait normal.  Skin: Skin is warm and dry. She is not diaphoretic. There is erythema.  Palpable hematoma leg  Psychiatric: Mood and affect normal.      No results found for this or any previous visit (from the past 2160 hour(s)).   Assessment & Plan  Problem List Items Addressed This Visit    None    Visit Diagnoses    Concussion, without loss of consciousness, initial encounter    -  Primary    Relevant Orders    CT Head Wo Contrast    Contusion, lower leg, right, sequela        Intractable chronic post-traumatic headache        Relevant Orders    CT Head Wo Contrast         Dr. Deanna Jones [facility]  02/16/2015

## 2015-04-26 ENCOUNTER — Ambulatory Visit
Admission: EM | Admit: 2015-04-26 | Discharge: 2015-04-26 | Disposition: A | Discharge status: Home / Self Care | Payer: Commercial Managed Care - HMO | Attending: Family Medicine | Admitting: Family Medicine

## 2015-04-26 ENCOUNTER — Ambulatory Visit: Payer: Commercial Managed Care - HMO | Admitting: Family Medicine

## 2015-04-26 ENCOUNTER — Ambulatory Visit
Admit: 2015-04-26 | Discharge: 2015-04-26 | Disposition: A | Discharge status: Home / Self Care | Payer: Commercial Managed Care - HMO | Attending: Family Medicine | Admitting: Family Medicine

## 2015-04-26 DIAGNOSIS — I679 Cerebrovascular disease, unspecified: Secondary | ICD-10-CM | POA: Insufficient documentation

## 2015-04-26 DIAGNOSIS — S0093XA Contusion of unspecified part of head, initial encounter: Secondary | ICD-10-CM | POA: Diagnosis not present

## 2015-04-26 DIAGNOSIS — S0990XA Unspecified injury of head, initial encounter: Secondary | ICD-10-CM | POA: Diagnosis not present

## 2015-04-26 DIAGNOSIS — R51 Headache: Secondary | ICD-10-CM | POA: Diagnosis not present

## 2015-04-26 DIAGNOSIS — W19XXXA Unspecified fall, initial encounter: Secondary | ICD-10-CM | POA: Diagnosis not present

## 2015-04-26 DIAGNOSIS — S060X0A Concussion without loss of consciousness, initial encounter: Secondary | ICD-10-CM

## 2015-04-26 NOTE — ED Notes (Signed)
CT of the head w/o contrast scheduled at [facility].  Patient is to head over there now and check-in at the Registration Desk at [facility].

## 2015-04-27 NOTE — ED Provider Notes (Signed)
CSN: [id number]     Arrival date & time 04/26/15  1044 History   First MD Initiated Contact with Patient 04/26/15 1135     No chief complaint on file.  (Consider location/radiation/quality/duration/timing/severity/associated sxs/prior Treatment) HPI Comments: 81 yo female presents with a complaint of headaches and "vision is a little bit off" since falling at home 4-5 days ago. States was on a step ladder and fell hitting the left side of her head on a counter or dresser. Denies loss of consciousness, but felt "dazed". States "I wasn't going to come in to be seen" but I've been having headaches since then and slight changes in my vision". Denies any vomiting, fevers, chills, numbness/tingling, one-sided weakness.  The history is provided by the patient.    Past Medical History  Diagnosis Date  . Hypertension   . Anxiety   . Depression   . Cancer     breast   Past Surgical History  Procedure Laterality Date  . Appendectomy    . Tubal ligation    . Colon surgery      12 in. removed due to polyps   No family history on file. Social History  Substance Use Topics  . Smoking status: Current Every Day Smoker  . Smokeless tobacco: Not on file  . Alcohol Use: 0.0 oz/week    0 Standard drinks or equivalent per week   OB History    No data available     Review of Systems  Allergies  Erythromycin; Iodine; Penicillins; and Shellfish allergy  Home Medications   Prior to Admission medications   Medication Sig Start Date End Date Taking? Authorizing Provider  carvedilol (COREG) 3.125 MG tablet Take 1 tablet by mouth 2 (two) times daily. 11/15/14   Historical Provider, MD  isosorbide mononitrate (IMDUR) 30 MG 24 hr tablet Take 1 tablet by mouth daily. 11/15/14   Historical Provider, MD  letrozole (FEMARA) 2.5 MG tablet Take 1 tablet by mouth daily. 12/24/14   Historical Provider, MD  sertraline (ZOLOFT) 100 MG tablet Take 1 tablet by mouth daily. 09/27/14   Historical Provider, MD    There were no vitals taken for this visit. Physical Exam  Constitutional: She appears well-developed and well-nourished. No distress.  HENT:  Head: Normocephalic.  Right Ear: External ear normal.  Left Ear: External ear normal.  Nose: Nose normal.  Mouth/Throat: Oropharynx is clear and moist. No oropharyngeal exudate.  3cm left lateral-posterior, slightly raised and tender hematoma on scalp; no external bleeding  Cardiovascular: Normal rate, regular rhythm and normal heart sounds.   Pulmonary/Chest: Effort normal and breath sounds normal. No respiratory distress. She has no wheezes. She has no rales.  Neurological: She is alert. She displays normal reflexes. No cranial nerve deficit. She exhibits normal muscle tone. Coordination normal.  Skin: No rash noted. She is not diaphoretic.  Nursing note and vitals reviewed.   ED Course  Procedures (including critical care time) Labs Review Labs Reviewed - No data to display  Imaging Review Ct Head Wo Contrast  04/26/2015   CLINICAL DATA:  [place] [address] 4 days ago hitting the left side of the head, headaches since  EXAM: CT HEAD WITHOUT CONTRAST  TECHNIQUE: Contiguous axial images were obtained from the base of the skull through the vertex without intravenous contrast.  COMPARISON:  CT brain scan of 02/16/2015  FINDINGS: The ventricular system remains slightly prominent as are the cortical sulci indicative of diffuse atrophy. The septum is midline in position. Small vessel ischemic change is  again noted throughout the periventricular white matter. No tear no hemorrhage, mass lesion, or acute infarction is seen. Dense bilateral basal ganglia calcifications again are noted. The paranasal sinuses appear clear. On bone window images, no acute calvarial abnormality is noted.  IMPRESSION: 1. Stable mild small vessel ischemic change. No acute intracranial abnormality. 2. No calvarial abnormality is seen   Electronically Signed   By: Ivar Drape M.D.   On:  04/26/2015 13:22     MDM   1. Head contusion, initial encounter   2. Mild concussion, without loss of consciousness, initial encounter    Plan: 1. diagnosis reviewed with patient 2. Recommend CT scan of head/brain (will have done at [facility]) 3. Further management pending CT results 4. Patient notified of CT results (negative for acute abnormality) and f/u with PCP  Norval Gable, MD 04/27/15 1354

## 2015-04-28 ENCOUNTER — Encounter: Payer: Self-pay | Admitting: Family Medicine

## 2015-04-28 ENCOUNTER — Ambulatory Visit (INDEPENDENT_AMBULATORY_CARE_PROVIDER_SITE_OTHER): Payer: Commercial Managed Care - HMO | Admitting: Family Medicine

## 2015-04-28 VITALS — BP 130/80 | HR 72 | Ht 67.0 in | Wt 157.0 lb

## 2015-04-28 DIAGNOSIS — H539 Unspecified visual disturbance: Secondary | ICD-10-CM

## 2015-04-28 DIAGNOSIS — J40 Bronchitis, not specified as acute or chronic: Secondary | ICD-10-CM

## 2015-04-28 DIAGNOSIS — R270 Ataxia, unspecified: Secondary | ICD-10-CM | POA: Diagnosis not present

## 2015-04-28 DIAGNOSIS — I10 Essential (primary) hypertension: Secondary | ICD-10-CM | POA: Diagnosis not present

## 2015-04-28 MED ORDER — LEVOFLOXACIN 500 MG PO TABS: 500.0000 mg | ORAL_TABLET | Freq: Every day | ORAL | Status: DC

## 2015-04-28 NOTE — Progress Notes (Addendum)
Name: Veronica Ellis   MRN: [id number]    DOB: [birth year]   Date:04/28/2015       Progress Note  Subjective  Chief Complaint  Chief Complaint  Patient presents with  . Gait Problem    falling    Fall The accident occurred 3 to 5 days ago. The fall occurred from a stool. She fell from a height of 3 to 5 ft. Impact surface: hit edge of dresser. There was no blood loss. The point of impact was the head. The pain is present in the head. The symptoms are aggravated by movement and standing. Associated symptoms include headaches. Pertinent negatives include no abdominal pain, bowel incontinence, fever, hearing loss, hematuria, loss of consciousness, nausea, numbness, tingling, visual change or vomiting. She has tried nothing for the symptoms. The treatment provided no relief.  Cough This is a new problem. The current episode started in the past 7 days. The problem has been gradually worsening. The problem occurs hourly. The cough is non-productive. Associated symptoms include headaches. Pertinent negatives include no chest pain, chills, ear pain, fever, heartburn, myalgias, rash, sore throat, shortness of breath, weight loss or wheezing. There is no history of environmental allergies.  Eye Problem  The right eye is affected. This is a recurrent problem. The current episode started more than 1 year ago. The problem has been gradually worsening. Injury mechanism: fall with head injury. There is no known exposure to pink eye. She does not wear contacts. Associated symptoms include blurred vision. Pertinent negatives include no eye discharge, double vision, fever, nausea, photophobia, tingling or vomiting.    No problem-specific assessment & plan notes found for this encounter.   Past Medical History  Diagnosis Date  . Hypertension   . Anxiety   . Depression   . Cancer     breast    Past Surgical History  Procedure Laterality Date  . Appendectomy    . Tubal ligation    . Colon surgery       12 in. removed due to polyps    History reviewed. No pertinent family history.  Social History   Social History  . Marital Status: Widowed    Spouse Name: N/A  . Number of Children: N/A  . Years of Education: N/A   Occupational History  . Not on file.   Social History Main Topics  . Smoking status: Current Every Day Smoker  . Smokeless tobacco: Not on file  . Alcohol Use: 0.0 oz/week    0 Standard drinks or equivalent per week  . Drug Use: No  . Sexual Activity: Yes   Other Topics Concern  . Not on file   Social History Narrative    Allergies  Allergen Reactions  . Erythromycin   . Iodine   . Penicillins   . Shellfish Allergy      Review of Systems  Constitutional: Negative for fever, chills, weight loss and malaise/fatigue.  HENT: Negative for ear discharge, ear pain and sore throat.   Eyes: Positive for blurred vision. Negative for double vision, photophobia and discharge.  Respiratory: Negative for cough, sputum production, shortness of breath and wheezing.   Cardiovascular: Negative for chest pain, palpitations and leg swelling.  Gastrointestinal: Negative for heartburn, nausea, vomiting, abdominal pain, diarrhea, constipation, blood in stool, melena and bowel incontinence.  Genitourinary: Negative for dysuria, urgency, frequency and hematuria.  Musculoskeletal: Negative for myalgias, back pain, joint pain and neck pain.  Skin: Negative for rash.  Neurological: Positive for headaches. Negative  for dizziness, tingling, sensory change, focal weakness, loss of consciousness and numbness.  Endo/Heme/Allergies: Negative for environmental allergies and polydipsia. Does not bruise/bleed easily.  Psychiatric/Behavioral: Negative for depression and suicidal ideas. The patient is not nervous/anxious and does not have insomnia.      Objective  Filed Vitals:   04/28/15 1410  BP: 130/80  Pulse: 72  Height: 5\' 7"  (1.702 m)  Weight: 157 lb (71.215 kg)     Physical Exam  Constitutional: She is well-developed, well-nourished, and in no distress. No distress.  HENT:  Head: Normocephalic and atraumatic.  Right Ear: External ear normal.  Left Ear: External ear normal.  Nose: Nose normal.  Mouth/Throat: Oropharynx is clear and moist.  Eyes: Conjunctivae and EOM are normal. Pupils are equal, round, and reactive to light. Right eye exhibits no discharge. Left eye exhibits no discharge.  Neck: Normal range of motion. Neck supple. No JVD present. No thyromegaly present.  Cardiovascular: Normal rate, regular rhythm, normal heart sounds and intact distal pulses.  Exam reveals no gallop and no friction rub.   No murmur heard. Pulmonary/Chest: Effort normal and breath sounds normal.  Abdominal: Soft. Bowel sounds are normal. She exhibits no mass. There is no tenderness. There is no guarding.  Musculoskeletal: Normal range of motion. She exhibits no edema.  Lymphadenopathy:    She has no cervical adenopathy.  Neurological: She is alert. She has normal reflexes.  Skin: Skin is warm and dry. She is not diaphoretic.  Psychiatric: Mood and affect normal.      Assessment & Plan  Problem List Items Addressed This Visit    None    Visit Diagnoses    Essential hypertension    -  Primary    Relevant Orders    Hemoglobin    Ataxia        Relevant Orders    Ambulatory referral to Neurology    Hemoglobin    Bronchitis        Relevant Medications    levofloxacin (LEVAQUIN) 500 MG tablet    Visual disturbance        Relevant Orders    Ambulatory referral to Ophthalmology         Dr. Otilio Miu [facility]  04/28/2015

## 2015-04-29 LAB — HEMOGLOBIN: Hemoglobin: 12.9 g/dL (ref 11.1–15.9)

## 2015-05-02 ENCOUNTER — Other Ambulatory Visit: Payer: Self-pay

## 2015-05-06 ENCOUNTER — Inpatient Hospital Stay: Payer: Commercial Managed Care - HMO | Attending: Oncology | Admitting: Oncology

## 2015-05-06 VITALS — BP 181/81 | HR 67 | Temp 98.8°F | Resp 20 | Wt 157.2 lb

## 2015-05-06 DIAGNOSIS — G629 Polyneuropathy, unspecified: Secondary | ICD-10-CM | POA: Diagnosis not present

## 2015-05-06 DIAGNOSIS — Z79899 Other long term (current) drug therapy: Secondary | ICD-10-CM | POA: Insufficient documentation

## 2015-05-06 DIAGNOSIS — M8580 Other specified disorders of bone density and structure, unspecified site: Secondary | ICD-10-CM | POA: Insufficient documentation

## 2015-05-06 DIAGNOSIS — F17210 Nicotine dependence, cigarettes, uncomplicated: Secondary | ICD-10-CM | POA: Diagnosis not present

## 2015-05-06 DIAGNOSIS — I10 Essential (primary) hypertension: Secondary | ICD-10-CM | POA: Diagnosis not present

## 2015-05-06 DIAGNOSIS — Z170 Estrogen receptor positive status [ER+]: Secondary | ICD-10-CM | POA: Diagnosis not present

## 2015-05-06 DIAGNOSIS — Z79811 Long term (current) use of aromatase inhibitors: Secondary | ICD-10-CM

## 2015-05-06 DIAGNOSIS — C50912 Malignant neoplasm of unspecified site of left female breast: Secondary | ICD-10-CM | POA: Diagnosis not present

## 2015-05-06 DIAGNOSIS — Z9181 History of falling: Secondary | ICD-10-CM | POA: Diagnosis not present

## 2015-05-06 DIAGNOSIS — R413 Other amnesia: Secondary | ICD-10-CM | POA: Insufficient documentation

## 2015-05-06 DIAGNOSIS — F418 Other specified anxiety disorders: Secondary | ICD-10-CM | POA: Diagnosis not present

## 2015-05-06 DIAGNOSIS — C50911 Malignant neoplasm of unspecified site of right female breast: Secondary | ICD-10-CM | POA: Insufficient documentation

## 2015-05-06 NOTE — Progress Notes (Signed)
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  INTERVAL HISTORY: Patient returns to clinic today for routine 6 months evaluation. She continues to be highly anxious, but otherwise feels well.  She has increased falls and neuropathy in her hands for which she is seeing a neurologist soon. She denies any recent fevers or illnesses.  She denies any weight loss.  She denies any chest pain or shortness of breath.  She denies any nausea, vomiting, constipation, or diarrhea.  She has no melena or hematochezia.  She has no urinary complaints.  Patient offers no further specific complaints today.    REVIEW OF SYSTEMS:   Review of Systems  Constitutional: Negative.   Respiratory: Negative.   Cardiovascular: Negative.   Neurological: Positive for sensory change.  Psychiatric/Behavioral: Positive for memory loss. The patient is nervous/anxious.     As per HPI. Otherwise, a complete review of systems is negatve.  PAST MEDICAL HISTORY: Past Medical History  Diagnosis Date  . Hypertension   . Anxiety   . Depression   . Cancer     breast    PAST SURGICAL HISTORY: Past Surgical History  Procedure Laterality Date  . Appendectomy    . Tubal ligation    . Colon surgery      12 in. removed due to polyps    FAMILY HISTORY No family history on file.     ADVANCED DIRECTIVES:    HEALTH MAINTENANCE: Social History  Substance Use Topics  . Smoking status: Current Every Day Smoker  . Smokeless tobacco: Not on file  . Alcohol Use: 0.0 oz/week    0 Standard drinks or equivalent per week     Colonoscopy:  PAP:  Bone density:  Lipid  panel:  Allergies  Allergen Reactions  . Erythromycin   . Iodine   . Penicillins   . Shellfish Allergy     Current Outpatient Prescriptions  Medication Sig Dispense Refill  . carvedilol (COREG) 3.125 MG tablet Take 1 tablet by mouth 2 (two) times daily.  11  . isosorbide mononitrate (IMDUR) 30 MG 24 hr tablet Take 1 tablet by mouth daily.  11  . letrozole (FEMARA) 2.5 MG tablet Take 1 tablet by mouth daily.    Marland Kitchen levofloxacin (LEVAQUIN) 500 MG tablet Take 1 tablet (500 mg total) by mouth daily. 7 tablet 0  . sertraline (ZOLOFT) 100 MG tablet Take 1 tablet by mouth daily.     No current facility-administered medications for this visit.    OBJECTIVE: There were no vitals filed for this visit.   There is no weight on file to calculate BMI.    ECOG FS:0 - Asymptomatic  General: Well-developed, well-nourished, no acute distress. Eyes: Pink conjunctiva, anicteric sclera. Breasts: Patient requested exam be deferred today. Heart: Regular rate and rhythm. No rubs, murmurs, or gallops. Lungs: Clear to auscultation bilaterally. Abdomen: Soft, nontender, nondistended. No organomegaly noted, normoactive bowel sounds. Musculoskeletal: No edema, cyanosis, or clubbing. Neuro: Alert, answering all questions appropriately. Cranial nerves grossly intact. Skin: No rashes or petechiae noted. Psych: Normal affect.   LAB RESULTS:  Lab Results  Component Value Date   NA 138 04/30/2014   K 4.7 04/30/2014   CL  105 04/30/2014   CO2 24 04/30/2014   GLUCOSE 97 04/30/2014   BUN 10 04/30/2014   CREATININE 1.06 10/13/2014   CALCIUM 8.5 04/30/2014   PROT 7.0 04/29/2014   ALBUMIN 2.3* 04/29/2014   AST 36 04/29/2014   ALT 53 04/29/2014   ALKPHOS 143* 04/29/2014   BILITOT 0.3 04/29/2014   GFRNONAA 53* 10/13/2014   GFRAA >60 10/13/2014    Lab Results  Component Value Date   WBC 9.4 10/15/2014   NEUTROABS 5.3 10/15/2014   HGB 11.8* 10/15/2014   HCT 36.7 10/15/2014   MCV 78* 10/15/2014   PLT  328 10/15/2014     STUDIES: Ct Head Wo Contrast  04/26/2015   CLINICAL DATA:  [place] [address] 4 days ago hitting the left side of the head, headaches since  EXAM: CT HEAD WITHOUT CONTRAST  TECHNIQUE: Contiguous axial images were obtained from the base of the skull through the vertex without intravenous contrast.  COMPARISON:  CT brain scan of 02/16/2015  FINDINGS: The ventricular system remains slightly prominent as are the cortical sulci indicative of diffuse atrophy. The septum is midline in position. Small vessel ischemic change is again noted throughout the periventricular white matter. No tear no hemorrhage, mass lesion, or acute infarction is seen. Dense bilateral basal ganglia calcifications again are noted. The paranasal sinuses appear clear. On bone window images, no acute calvarial abnormality is noted.  IMPRESSION: 1. Stable mild small vessel ischemic change. No acute intracranial abnormality. 2. No calvarial abnormality is seen   Electronically Signed   By: Ivar Drape M.D.   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  PLAN:    1.  Bilateral breast cancer:  No evidence of disease.  Previously after lengthy discussion with patient and her family about her intermediate Oncotype score, she declined adjuvant chemotherapy.  Continue letrozole completing in January 2020.  Patient's most recent mammogram on April 27, 2014 was reported as BI-RADS 2, repeat in the next 1-2 weeks.  Return to clinic in 6 months for routine evaluation.   2.  Chest CT abnormalities: biopsy results as above. No evidence of malignancy. No further intervention or imaging is necessary. 3. Falls/neuropathy: Treatment per neurology. 4. Osteopenia: Patient's most recent bone mineral density in January 2015 was reported -1.9. Repeat in the next 1-2 weeks.  Patient expressed  understanding and was in agreement with this plan. She also understands that She can call clinic at any time with any questions, concerns, or complaints.    Lloyd Huger, MD   05/06/2015 11:38 AM

## 2015-05-06 NOTE — Progress Notes (Signed)
Pt here for ongoing evaluation of breast cancer. Continues taking her femara daily . Pt states she thinks the femara is causing nausea on a daily basis. Pt has changed her mammogram appt on more then 1 occasion. She is over due. Pt prefers not to have a breast exam today. Her PCP is scheduling an appt with a neurologist d/t recent falls. Her legs feel weak at times. She feels dizzy at times. She has a stinging sensation in her R thumb. Complains of headaches and changes in her vision. Denies any breast pain.

## 2015-05-13 ENCOUNTER — Other Ambulatory Visit: Payer: Commercial Managed Care - HMO

## 2015-05-13 ENCOUNTER — Ambulatory Visit: Payer: Commercial Managed Care - HMO

## 2015-05-19 ENCOUNTER — Ambulatory Visit: Payer: Commercial Managed Care - HMO

## 2015-05-19 ENCOUNTER — Other Ambulatory Visit: Payer: Commercial Managed Care - HMO

## 2015-05-20 DIAGNOSIS — H2511 Age-related nuclear cataract, right eye: Secondary | ICD-10-CM | POA: Diagnosis not present

## 2015-05-31 ENCOUNTER — Ambulatory Visit
Admission: RE | Admit: 2015-05-31 | Discharge: 2015-05-31 | Disposition: A | Discharge status: Home / Self Care | Payer: Commercial Managed Care - HMO | Source: Ambulatory Visit | Attending: Oncology | Admitting: Oncology

## 2015-05-31 DIAGNOSIS — M8588 Other specified disorders of bone density and structure, other site: Secondary | ICD-10-CM | POA: Diagnosis not present

## 2015-05-31 DIAGNOSIS — Z853 Personal history of malignant neoplasm of breast: Secondary | ICD-10-CM

## 2015-05-31 DIAGNOSIS — M8580 Other specified disorders of bone density and structure, unspecified site: Secondary | ICD-10-CM

## 2015-05-31 DIAGNOSIS — R928 Other abnormal and inconclusive findings on diagnostic imaging of breast: Secondary | ICD-10-CM | POA: Diagnosis not present

## 2015-05-31 HISTORY — DX: Malignant neoplasm of unspecified site of unspecified female breast: C50.919

## 2015-06-16 DIAGNOSIS — R2681 Unsteadiness on feet: Secondary | ICD-10-CM | POA: Diagnosis not present

## 2015-06-16 DIAGNOSIS — R419 Unspecified symptoms and signs involving cognitive functions and awareness: Secondary | ICD-10-CM | POA: Diagnosis not present

## 2015-06-16 DIAGNOSIS — R413 Other amnesia: Secondary | ICD-10-CM | POA: Diagnosis not present

## 2015-06-16 DIAGNOSIS — R197 Diarrhea, unspecified: Secondary | ICD-10-CM | POA: Diagnosis not present

## 2015-06-22 ENCOUNTER — Encounter: Payer: Self-pay | Admitting: Family Medicine

## 2015-06-22 ENCOUNTER — Ambulatory Visit (INDEPENDENT_AMBULATORY_CARE_PROVIDER_SITE_OTHER): Payer: Commercial Managed Care - HMO | Admitting: Family Medicine

## 2015-06-22 VITALS — BP 120/58 | HR 80 | Temp 98.5°F | Ht 67.0 in | Wt 155.0 lb

## 2015-06-22 DIAGNOSIS — J432 Centrilobular emphysema: Secondary | ICD-10-CM

## 2015-06-22 DIAGNOSIS — K6289 Other specified diseases of anus and rectum: Secondary | ICD-10-CM | POA: Diagnosis not present

## 2015-06-22 DIAGNOSIS — J209 Acute bronchitis, unspecified: Secondary | ICD-10-CM | POA: Diagnosis not present

## 2015-06-22 DIAGNOSIS — Z720 Tobacco use: Secondary | ICD-10-CM | POA: Diagnosis not present

## 2015-06-22 MED ORDER — ALBUTEROL SULFATE HFA 108 (90 BASE) MCG/ACT IN AERS: 2.0000 | INHALATION_SPRAY | Freq: Four times a day (QID) | RESPIRATORY_TRACT | Status: DC | PRN

## 2015-06-22 MED ORDER — CLOTRIMAZOLE-BETAMETHASONE 1-0.05 % EX CREA: 1.0000 "application " | TOPICAL_CREAM | Freq: Two times a day (BID) | CUTANEOUS | Status: DC

## 2015-06-22 MED ORDER — LEVOFLOXACIN 500 MG PO TABS: 500.0000 mg | ORAL_TABLET | Freq: Every day | ORAL | Status: DC

## 2015-06-22 MED ORDER — ALBUTEROL SULFATE (2.5 MG/3ML) 0.083% IN NEBU: 2.5000 mg | INHALATION_SOLUTION | Freq: Once | RESPIRATORY_TRACT | Status: DC

## 2015-06-22 NOTE — Patient Instructions (Signed)
Smoking Cessation, Tips for Success If you are ready to quit smoking, congratulations! You have chosen to help yourself be healthier. Cigarettes bring nicotine, tar, carbon monoxide, and other irritants into your body. Your lungs, heart, and blood vessels will be able to work better without these poisons. There are many different ways to quit smoking. Nicotine gum, nicotine patches, a nicotine inhaler, or nicotine nasal spray can help with physical craving. Hypnosis, support groups, and medicines help break the habit of smoking. WHAT THINGS CAN I DO TO MAKE QUITTING EASIER?  Here are some tips to help you quit for good:  Pick a date when you will quit smoking completely. Tell all of your friends and family about your plan to quit on that date.  Do not try to slowly cut down on the number of cigarettes you are smoking. Pick a quit date and quit smoking completely starting on that day.  Throw away all cigarettes.   Clean and remove all ashtrays from your home, work, and car.  On a card, write down your reasons for quitting. Carry the card with you and read it when you get the urge to smoke.  Cleanse your body of nicotine. Drink enough water and fluids to keep your urine clear or pale yellow. Do this after quitting to flush the nicotine from your body.  Learn to predict your moods. Do not let a bad situation be your excuse to have a cigarette. Some situations in your life might tempt you into wanting a cigarette.  Never have "just one" cigarette. It leads to wanting another and another. Remind yourself of your decision to quit.  Change habits associated with smoking. If you smoked while driving or when feeling stressed, try other activities to replace smoking. Stand up when drinking your coffee. Brush your teeth after eating. Sit in a different chair when you read the paper. Avoid alcohol while trying to quit, and try to drink fewer caffeinated beverages. Alcohol and caffeine may urge you to  smoke.  Avoid foods and drinks that can trigger a desire to smoke, such as sugary or spicy foods and alcohol.  Ask people who smoke not to smoke around you.  Have something planned to do right after eating or having a cup of coffee. For example, plan to take a walk or exercise.  Try a relaxation exercise to calm you down and decrease your stress. Remember, you may be tense and nervous for the first 2 weeks after you quit, but this will pass.  Find new activities to keep your hands busy. Play with a pen, coin, or rubber band. Doodle or draw things on paper.  Brush your teeth right after eating. This will help cut down on the craving for the taste of tobacco after meals. You can also try mouthwash.   Use oral substitutes in place of cigarettes. Try using lemon drops, carrots, cinnamon sticks, or chewing gum. Keep them handy so they are available when you have the urge to smoke.  When you have the urge to smoke, try deep breathing.  Designate your home as a nonsmoking area.  If you are a heavy smoker, ask your health care provider about a prescription for nicotine chewing gum. It can ease your withdrawal from nicotine.  Reward yourself. Set aside the cigarette money you save and buy yourself something nice.  Look for support from others. Join a support group or smoking cessation program. Ask someone at home or at work to help you with your plan   to quit smoking.  Always ask yourself, "Do I need this cigarette or is this just a reflex?" Tell yourself, "Today, I choose not to smoke," or "I do not want to smoke." You are reminding yourself of your decision to quit.  Do not replace cigarette smoking with electronic cigarettes (commonly called e-cigarettes). The safety of e-cigarettes is unknown, and some may contain harmful chemicals.  If you relapse, do not give up! Plan ahead and think about what you will do the next time you get the urge to smoke. HOW WILL I FEEL WHEN I QUIT SMOKING? You  may have symptoms of withdrawal because your body is used to nicotine (the addictive substance in cigarettes). You may crave cigarettes, be irritable, feel very hungry, cough often, get headaches, or have difficulty concentrating. The withdrawal symptoms are only temporary. They are strongest when you first quit but will go away within 10-14 days. When withdrawal symptoms occur, stay in control. Think about your reasons for quitting. Remind yourself that these are signs that your body is healing and getting used to being without cigarettes. Remember that withdrawal symptoms are easier to treat than the major diseases that smoking can cause.  Even after the withdrawal is over, expect periodic urges to smoke. However, these cravings are generally short lived and will go away whether you smoke or not. Do not smoke! WHAT RESOURCES ARE AVAILABLE TO HELP ME QUIT SMOKING? Your health care provider can direct you to community resources or hospitals for support, which may include:  Group support.  Education.  Hypnosis.  Therapy.   This information is not intended to replace advice given to you by your health care provider. Make sure you discuss any questions you have with your health care provider.   Document Released: 05/25/2004 Document Revised: 09/17/2014 Document Reviewed: 02/12/2013 Elsevier Interactive Patient Education 2016 [employer] Inc.  

## 2015-06-22 NOTE — Progress Notes (Signed)
Name: Veronica Ellis   MRN: [id number]    DOB: [birth year]   Date:06/22/2015       Progress Note  Subjective  Chief Complaint  Chief Complaint  Patient presents with  . Cough    congestion, wheezing, diarrhea x 5 days    Cough This is a recurrent problem. The current episode started in the past 7 days. The problem has been gradually improving. The problem occurs every few minutes. The cough is non-productive. Pertinent negatives include no chest pain, chills, ear congestion, ear pain, fever, headaches, heartburn, hemoptysis, myalgias, nasal congestion, postnasal drip, rash, rhinorrhea, sore throat, shortness of breath, sweats, weight loss or wheezing. Risk factors for lung disease include smoking/tobacco exposure. She has tried nothing for the symptoms. The treatment provided mild relief. Her past medical history is significant for COPD. There is no history of asthma, bronchiectasis, bronchitis, emphysema, environmental allergies or pneumonia.  Shortness of Breath This is a recurrent problem. The current episode started in the past 7 days. The problem occurs daily. The problem has been gradually worsening. Pertinent negatives include no abdominal pain, chest pain, ear pain, fever, headaches, hemoptysis, leg swelling, neck pain, rash, rhinorrhea, sore throat, sputum production or wheezing. Nothing aggravates the symptoms. She has tried nothing for the symptoms. Her past medical history is significant for COPD. There is no history of allergies, asthma or pneumonia.    No problem-specific assessment & plan notes found for this encounter.   Past Medical History  Diagnosis Date  . Hypertension   . Anxiety   . Depression   . Cancer [facility])     breast  . Breast cancer ([place]) 2014    bilateral invasive mammary carcinoma. with 36 rad tx.     Past Surgical History  Procedure Laterality Date  . Appendectomy    . Tubal ligation    . Colon surgery      12 in. removed due to polyps     Family History  Problem Relation Age of Onset  . Breast cancer Daughter 63    Social History   Social History  . Marital Status: Widowed    Spouse Name: N/A  . Number of Children: N/A  . Years of Education: N/A   Occupational History  . Not on file.   Social History Main Topics  . Smoking status: Current Every Day Smoker  . Smokeless tobacco: Not on file  . Alcohol Use: 0.0 oz/week    0 Standard drinks or equivalent per week  . Drug Use: No  . Sexual Activity: Yes   Other Topics Concern  . Not on file   Social History Narrative    Allergies  Allergen Reactions  . Erythromycin   . Iodine   . Penicillins   . Shellfish Allergy      Review of Systems  Constitutional: Negative for fever, chills, weight loss and malaise/fatigue.  HENT: Negative for ear discharge, ear pain, postnasal drip, rhinorrhea and sore throat.   Eyes: Negative for blurred vision.  Respiratory: Positive for cough. Negative for hemoptysis, sputum production, shortness of breath and wheezing.   Cardiovascular: Negative for chest pain, palpitations and leg swelling.  Gastrointestinal: Negative for heartburn, nausea, abdominal pain, diarrhea, constipation, blood in stool and melena.  Genitourinary: Negative for dysuria, urgency, frequency and hematuria.  Musculoskeletal: Negative for myalgias, back pain, joint pain and neck pain.  Skin: Negative for rash.  Neurological: Negative for dizziness, tingling, sensory change, focal weakness and headaches.  Endo/Heme/Allergies: Negative for environmental allergies  and polydipsia. Does not bruise/bleed easily.  Psychiatric/Behavioral: Negative for depression and suicidal ideas. The patient is not nervous/anxious and does not have insomnia.      Objective  Filed Vitals:   06/22/15 1131 06/22/15 1202  BP: 120/58   Pulse: 80   Temp: 98.5 F (36.9 C)   TempSrc: Oral   Height: 5\' 7"  (1.702 m)   Weight: 155 lb (70.308 kg)   SpO2:  98%     Physical Exam  Constitutional: She is well-developed, well-nourished, and in no distress. No distress.  HENT:  Head: Normocephalic and atraumatic.  Right Ear: External ear normal.  Left Ear: External ear normal.  Nose: Nose normal.  Mouth/Throat: Oropharynx is clear and moist.  Eyes: Conjunctivae and EOM are normal. Pupils are equal, round, and reactive to light. Right eye exhibits no discharge. Left eye exhibits no discharge.  Neck: Normal range of motion. Neck supple. No JVD present. No thyromegaly present.  Cardiovascular: Normal rate, regular rhythm, normal heart sounds and intact distal pulses.  Exam reveals no gallop and no friction rub.   No murmur heard. Pulmonary/Chest: Effort normal. She has wheezes. She has no rales.  Improved s/p nebulization  Abdominal: Soft. Bowel sounds are normal. She exhibits no mass. There is no tenderness. There is no guarding.  Musculoskeletal: Normal range of motion. She exhibits no edema.  Lymphadenopathy:    She has no cervical adenopathy.  Neurological: She is alert. She has normal reflexes.  Skin: Skin is warm and dry. She is not diaphoretic.  Psychiatric: Mood and affect normal.      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HCC)   Relevant Medications   albuterol (PROVENTIL HFA;VENTOLIN HFA) 108 (90 BASE) MCG/ACT inhaler   albuterol (PROVENTIL) (2.5 MG/3ML) 0.083% nebulizer solution 2.5 mg    Other Visit Diagnoses    Acute bronchitis, unspecified organism    -  Primary    Relevant Medications    albuterol (PROVENTIL HFA;VENTOLIN HFA) 108 (90 BASE) MCG/ACT inhaler    albuterol (PROVENTIL) (2.5 MG/3ML) 0.083% nebulizer solution 2.5 mg    levofloxacin (LEVAQUIN) 500 MG tablet    Tobacco abuse disorder        Proctitis        Relevant Medications    clotrimazole-betamethasone (LOTRISONE) cream         Dr. Deanna Jones [facility]  06/22/2015

## 2015-06-24 ENCOUNTER — Ambulatory Visit: Payer: Commercial Managed Care - HMO | Admitting: Family Medicine

## 2015-06-28 ENCOUNTER — Other Ambulatory Visit: Payer: Self-pay | Admitting: Neurology

## 2015-06-28 DIAGNOSIS — R413 Other amnesia: Secondary | ICD-10-CM

## 2015-06-28 DIAGNOSIS — R410 Disorientation, unspecified: Secondary | ICD-10-CM

## 2015-06-28 DIAGNOSIS — R270 Ataxia, unspecified: Secondary | ICD-10-CM

## 2015-07-01 ENCOUNTER — Other Ambulatory Visit: Payer: Self-pay

## 2015-07-12 ENCOUNTER — Ambulatory Visit: Payer: Commercial Managed Care - HMO

## 2015-07-13 ENCOUNTER — Ambulatory Visit: Payer: Commercial Managed Care - HMO | Admitting: Physical Therapy

## 2015-07-18 ENCOUNTER — Ambulatory Visit: Payer: Commercial Managed Care - HMO | Admitting: Physical Therapy

## 2015-07-25 ENCOUNTER — Ambulatory Visit: Payer: Commercial Managed Care - HMO | Admitting: Physical Therapy

## 2015-07-29 DIAGNOSIS — Z9181 History of falling: Secondary | ICD-10-CM | POA: Diagnosis not present

## 2015-07-29 DIAGNOSIS — J439 Emphysema, unspecified: Secondary | ICD-10-CM | POA: Diagnosis not present

## 2015-07-29 DIAGNOSIS — Z8673 Personal history of transient ischemic attack (TIA), and cerebral infarction without residual deficits: Secondary | ICD-10-CM | POA: Diagnosis not present

## 2015-07-29 DIAGNOSIS — F329 Major depressive disorder, single episode, unspecified: Secondary | ICD-10-CM | POA: Diagnosis not present

## 2015-07-29 DIAGNOSIS — F17210 Nicotine dependence, cigarettes, uncomplicated: Secondary | ICD-10-CM | POA: Diagnosis not present

## 2015-07-29 DIAGNOSIS — R2689 Other abnormalities of gait and mobility: Secondary | ICD-10-CM | POA: Diagnosis not present

## 2015-07-29 DIAGNOSIS — I10 Essential (primary) hypertension: Secondary | ICD-10-CM | POA: Diagnosis not present

## 2015-07-29 DIAGNOSIS — Z853 Personal history of malignant neoplasm of breast: Secondary | ICD-10-CM | POA: Diagnosis not present

## 2015-07-29 DIAGNOSIS — F419 Anxiety disorder, unspecified: Secondary | ICD-10-CM | POA: Diagnosis not present

## 2015-08-02 DIAGNOSIS — Z8673 Personal history of transient ischemic attack (TIA), and cerebral infarction without residual deficits: Secondary | ICD-10-CM | POA: Diagnosis not present

## 2015-08-02 DIAGNOSIS — R2689 Other abnormalities of gait and mobility: Secondary | ICD-10-CM | POA: Diagnosis not present

## 2015-08-02 DIAGNOSIS — Z9181 History of falling: Secondary | ICD-10-CM | POA: Diagnosis not present

## 2015-08-02 DIAGNOSIS — F329 Major depressive disorder, single episode, unspecified: Secondary | ICD-10-CM | POA: Diagnosis not present

## 2015-08-02 DIAGNOSIS — F419 Anxiety disorder, unspecified: Secondary | ICD-10-CM | POA: Diagnosis not present

## 2015-08-02 DIAGNOSIS — Z853 Personal history of malignant neoplasm of breast: Secondary | ICD-10-CM | POA: Diagnosis not present

## 2015-08-02 DIAGNOSIS — F17210 Nicotine dependence, cigarettes, uncomplicated: Secondary | ICD-10-CM | POA: Diagnosis not present

## 2015-08-02 DIAGNOSIS — J439 Emphysema, unspecified: Secondary | ICD-10-CM | POA: Diagnosis not present

## 2015-08-02 DIAGNOSIS — I10 Essential (primary) hypertension: Secondary | ICD-10-CM | POA: Diagnosis not present

## 2015-08-11 DIAGNOSIS — I10 Essential (primary) hypertension: Secondary | ICD-10-CM | POA: Diagnosis not present

## 2015-08-11 DIAGNOSIS — F419 Anxiety disorder, unspecified: Secondary | ICD-10-CM | POA: Diagnosis not present

## 2015-08-11 DIAGNOSIS — Z8673 Personal history of transient ischemic attack (TIA), and cerebral infarction without residual deficits: Secondary | ICD-10-CM | POA: Diagnosis not present

## 2015-08-11 DIAGNOSIS — F17210 Nicotine dependence, cigarettes, uncomplicated: Secondary | ICD-10-CM | POA: Diagnosis not present

## 2015-08-11 DIAGNOSIS — J439 Emphysema, unspecified: Secondary | ICD-10-CM | POA: Diagnosis not present

## 2015-08-11 DIAGNOSIS — Z9181 History of falling: Secondary | ICD-10-CM | POA: Diagnosis not present

## 2015-08-11 DIAGNOSIS — R2689 Other abnormalities of gait and mobility: Secondary | ICD-10-CM | POA: Diagnosis not present

## 2015-08-11 DIAGNOSIS — F329 Major depressive disorder, single episode, unspecified: Secondary | ICD-10-CM | POA: Diagnosis not present

## 2015-08-11 DIAGNOSIS — Z853 Personal history of malignant neoplasm of breast: Secondary | ICD-10-CM | POA: Diagnosis not present

## 2015-08-17 DIAGNOSIS — Z853 Personal history of malignant neoplasm of breast: Secondary | ICD-10-CM | POA: Diagnosis not present

## 2015-08-17 DIAGNOSIS — Z8673 Personal history of transient ischemic attack (TIA), and cerebral infarction without residual deficits: Secondary | ICD-10-CM | POA: Diagnosis not present

## 2015-08-17 DIAGNOSIS — Z9181 History of falling: Secondary | ICD-10-CM | POA: Diagnosis not present

## 2015-08-17 DIAGNOSIS — F419 Anxiety disorder, unspecified: Secondary | ICD-10-CM | POA: Diagnosis not present

## 2015-08-17 DIAGNOSIS — R2689 Other abnormalities of gait and mobility: Secondary | ICD-10-CM | POA: Diagnosis not present

## 2015-08-17 DIAGNOSIS — I10 Essential (primary) hypertension: Secondary | ICD-10-CM | POA: Diagnosis not present

## 2015-08-17 DIAGNOSIS — F329 Major depressive disorder, single episode, unspecified: Secondary | ICD-10-CM | POA: Diagnosis not present

## 2015-08-17 DIAGNOSIS — J439 Emphysema, unspecified: Secondary | ICD-10-CM | POA: Diagnosis not present

## 2015-08-17 DIAGNOSIS — F17210 Nicotine dependence, cigarettes, uncomplicated: Secondary | ICD-10-CM | POA: Diagnosis not present

## 2015-08-18 ENCOUNTER — Other Ambulatory Visit: Payer: Self-pay | Admitting: Family Medicine

## 2015-08-18 ENCOUNTER — Other Ambulatory Visit: Payer: Self-pay | Admitting: Oncology

## 2015-08-18 DIAGNOSIS — J439 Emphysema, unspecified: Secondary | ICD-10-CM | POA: Diagnosis not present

## 2015-08-18 DIAGNOSIS — Z8673 Personal history of transient ischemic attack (TIA), and cerebral infarction without residual deficits: Secondary | ICD-10-CM | POA: Diagnosis not present

## 2015-08-18 DIAGNOSIS — R2689 Other abnormalities of gait and mobility: Secondary | ICD-10-CM | POA: Diagnosis not present

## 2015-08-18 DIAGNOSIS — F329 Major depressive disorder, single episode, unspecified: Secondary | ICD-10-CM | POA: Diagnosis not present

## 2015-08-18 DIAGNOSIS — I10 Essential (primary) hypertension: Secondary | ICD-10-CM | POA: Diagnosis not present

## 2015-08-18 DIAGNOSIS — Z9181 History of falling: Secondary | ICD-10-CM | POA: Diagnosis not present

## 2015-08-18 DIAGNOSIS — Z853 Personal history of malignant neoplasm of breast: Secondary | ICD-10-CM | POA: Diagnosis not present

## 2015-08-18 DIAGNOSIS — F419 Anxiety disorder, unspecified: Secondary | ICD-10-CM | POA: Diagnosis not present

## 2015-08-18 DIAGNOSIS — F17210 Nicotine dependence, cigarettes, uncomplicated: Secondary | ICD-10-CM | POA: Diagnosis not present

## 2015-08-22 ENCOUNTER — Ambulatory Visit
Admission: EM | Admit: 2015-08-22 | Discharge: 2015-08-22 | Disposition: A | Discharge status: Home / Self Care | Payer: Commercial Managed Care - HMO | Attending: Family Medicine | Admitting: Family Medicine

## 2015-08-22 ENCOUNTER — Encounter: Payer: Self-pay | Admitting: Emergency Medicine

## 2015-08-22 DIAGNOSIS — M25531 Pain in right wrist: Secondary | ICD-10-CM | POA: Diagnosis not present

## 2015-08-22 DIAGNOSIS — M25511 Pain in right shoulder: Secondary | ICD-10-CM

## 2015-08-22 MED ORDER — HYDROCODONE-ACETAMINOPHEN 5-325 MG PO TABS
ORAL_TABLET | ORAL | Status: DC
Start: 2015-08-22 — End: 2016-02-13

## 2015-08-22 NOTE — ED Provider Notes (Signed)
CSN: [id number]     Arrival date & time 08/22/15  1336 History   First MD Initiated Contact with Patient 08/22/15 1600     Chief Complaint  Patient presents with  . Shoulder Pain  . Hand Pain   (Consider location/radiation/quality/duration/timing/severity/associated sxs/prior Treatment) HPI Comments: 81 yo female with a h/o osteoarthritis presents with a 2 weeks h/o right shoulder and right wrist pain. Denies any injuries, falls, swelling, redness, rash. States has been taking regular aspirin which has helped and today feels slightly better.   Patient is a 81 y.o. female presenting with shoulder pain and hand pain. The history is provided by the patient.  Shoulder Pain Hand Pain    Past Medical History  Diagnosis Date  . Hypertension   . Anxiety   . Depression   . Cancer [facility])     breast  . Breast cancer ([place]) 2014    bilateral invasive mammary carcinoma. with 36 rad tx.    Past Surgical History  Procedure Laterality Date  . Appendectomy    . Tubal ligation    . Colon surgery      12 in. removed due to polyps   Family History  Problem Relation Age of Onset  . Breast cancer Daughter 51   Social History  Substance Use Topics  . Smoking status: Current Every Day Smoker  . Smokeless tobacco: None  . Alcohol Use: 0.0 oz/week    0 Standard drinks or equivalent per week   OB History    No data available     Review of Systems  Allergies  Erythromycin; Iodine; Penicillins; and Shellfish allergy  Home Medications   Prior to Admission medications   Medication Sig Start Date End Date Taking? Authorizing Provider  albuterol (PROVENTIL HFA;VENTOLIN HFA) 108 (90 BASE) MCG/ACT inhaler Inhale 2 puffs into the lungs every 6 (six) hours as needed for wheezing or shortness of breath. 06/22/15   Juline Patch, MD  carvedilol (COREG) 3.125 MG tablet Take 1 tablet by mouth 2 (two) times daily. 11/15/14   Historical Provider, MD  clotrimazole-betamethasone (LOTRISONE) cream Apply  1 application topically 2 (two) times daily. 06/22/15   Juline Patch, MD  HYDROcodone-acetaminophen (NORCO/VICODIN) 5-325 MG tablet 1 tab po qhs prn 08/22/15   Norval Gable, MD  isosorbide mononitrate (IMDUR) 30 MG 24 hr tablet Take 1 tablet by mouth daily. 11/15/14   Historical Provider, MD  letrozole (FEMARA) 2.5 MG tablet TAKE 1 TABLET EVERY DAY 08/18/15   Lloyd Huger, MD  levofloxacin (LEVAQUIN) 500 MG tablet Take 1 tablet (500 mg total) by mouth daily. 06/22/15   Juline Patch, MD  sertraline (ZOLOFT) 100 MG tablet TAKE 1 TABLET EVERY DAY 08/18/15   Juline Patch, MD   Meds Ordered and Administered this Visit  Medications - No data to display  BP 147/68 mmHg  Pulse 82  Temp(Src) 98.6 F (37 C) (Tympanic)  Resp 16  Ht 5\' 7"  (1.702 m)  Wt 150 lb (68.04 kg)  BMI 23.49 kg/m2  SpO2 99% No data found.   Physical Exam  Constitutional: She appears well-developed and well-nourished. No distress.  Musculoskeletal:       Right shoulder: She exhibits normal range of motion, no tenderness, no bony tenderness, no swelling, no effusion, no crepitus, no deformity, no laceration, no pain, no spasm, normal pulse and normal strength.       Right wrist: She exhibits normal range of motion, no tenderness, no bony tenderness, no swelling, no  effusion, no crepitus, no deformity and no laceration.  Skin: No rash noted. She is not diaphoretic.  Nursing note and vitals reviewed.   ED Course  Procedures (including critical care time)  Labs Review Labs Reviewed - No data to display  Imaging Review No results found.   Visual Acuity Review  Right Eye Distance:   Left Eye Distance:   Bilateral Distance:    Right Eye Near:   Left Eye Near:    Bilateral Near:         MDM   1. Right shoulder pain   2. Wrist pain, right   (likely secondary to osteoarthritis)  1. diagnosis reviewed with patient 2. rx as per orders above; reviewed possible side effects, interactions, risks and  benefits  3. Recommend supportive treatment with heat to affected area; range of motion exercises 4. Follow-up prn if symptoms worsen or don't improve    Norval Gable, MD 08/22/15 1644

## 2015-08-22 NOTE — ED Notes (Signed)
Patient c/o pain and swelling in her right hand and pain in her right shoulder for the past 2 weeks.  Patient reports increase in pain when she uses her right hand to grip things.

## 2015-09-28 ENCOUNTER — Ambulatory Visit: Payer: Commercial Managed Care - HMO | Admitting: Family Medicine

## 2015-11-10 DIAGNOSIS — M25511 Pain in right shoulder: Secondary | ICD-10-CM | POA: Diagnosis not present

## 2015-11-10 DIAGNOSIS — Z9181 History of falling: Secondary | ICD-10-CM | POA: Diagnosis not present

## 2015-11-10 DIAGNOSIS — M6281 Muscle weakness (generalized): Secondary | ICD-10-CM | POA: Diagnosis not present

## 2015-11-10 DIAGNOSIS — M545 Low back pain: Secondary | ICD-10-CM | POA: Diagnosis not present

## 2015-11-14 DIAGNOSIS — M25511 Pain in right shoulder: Secondary | ICD-10-CM | POA: Diagnosis not present

## 2015-11-14 DIAGNOSIS — M545 Low back pain: Secondary | ICD-10-CM | POA: Diagnosis not present

## 2015-11-14 DIAGNOSIS — M6281 Muscle weakness (generalized): Secondary | ICD-10-CM | POA: Diagnosis not present

## 2015-11-14 DIAGNOSIS — Z9181 History of falling: Secondary | ICD-10-CM | POA: Diagnosis not present

## 2015-11-21 DIAGNOSIS — M545 Low back pain: Secondary | ICD-10-CM | POA: Diagnosis not present

## 2015-11-21 DIAGNOSIS — Z9181 History of falling: Secondary | ICD-10-CM | POA: Diagnosis not present

## 2015-11-21 DIAGNOSIS — M25511 Pain in right shoulder: Secondary | ICD-10-CM | POA: Diagnosis not present

## 2015-11-21 DIAGNOSIS — M6281 Muscle weakness (generalized): Secondary | ICD-10-CM | POA: Diagnosis not present

## 2015-11-22 ENCOUNTER — Other Ambulatory Visit: Payer: Self-pay | Admitting: Family Medicine

## 2015-11-23 ENCOUNTER — Other Ambulatory Visit: Payer: Self-pay

## 2015-11-23 MED ORDER — LETROZOLE 2.5 MG PO TABS: 2.5000 mg | ORAL_TABLET | Freq: Every day | ORAL | Status: DC

## 2015-11-24 DIAGNOSIS — Z9181 History of falling: Secondary | ICD-10-CM | POA: Diagnosis not present

## 2015-11-24 DIAGNOSIS — M25511 Pain in right shoulder: Secondary | ICD-10-CM | POA: Diagnosis not present

## 2015-11-24 DIAGNOSIS — M545 Low back pain: Secondary | ICD-10-CM | POA: Diagnosis not present

## 2015-11-24 DIAGNOSIS — M6281 Muscle weakness (generalized): Secondary | ICD-10-CM | POA: Diagnosis not present

## 2015-12-01 DIAGNOSIS — Z9181 History of falling: Secondary | ICD-10-CM | POA: Diagnosis not present

## 2015-12-01 DIAGNOSIS — M25511 Pain in right shoulder: Secondary | ICD-10-CM | POA: Diagnosis not present

## 2015-12-01 DIAGNOSIS — M6281 Muscle weakness (generalized): Secondary | ICD-10-CM | POA: Diagnosis not present

## 2015-12-01 DIAGNOSIS — M545 Low back pain: Secondary | ICD-10-CM | POA: Diagnosis not present

## 2015-12-05 DIAGNOSIS — M6281 Muscle weakness (generalized): Secondary | ICD-10-CM | POA: Diagnosis not present

## 2015-12-05 DIAGNOSIS — M545 Low back pain: Secondary | ICD-10-CM | POA: Diagnosis not present

## 2015-12-05 DIAGNOSIS — M25511 Pain in right shoulder: Secondary | ICD-10-CM | POA: Diagnosis not present

## 2015-12-05 DIAGNOSIS — Z9181 History of falling: Secondary | ICD-10-CM | POA: Diagnosis not present

## 2016-01-02 ENCOUNTER — Other Ambulatory Visit: Payer: Self-pay | Admitting: Family Medicine

## 2016-01-20 ENCOUNTER — Other Ambulatory Visit: Payer: Self-pay | Admitting: Family Medicine

## 2016-02-13 ENCOUNTER — Ambulatory Visit (INDEPENDENT_AMBULATORY_CARE_PROVIDER_SITE_OTHER): Payer: PPO | Admitting: Family Medicine

## 2016-02-13 ENCOUNTER — Encounter: Payer: Self-pay | Admitting: Family Medicine

## 2016-02-13 ENCOUNTER — Telehealth: Payer: Self-pay | Admitting: Oncology

## 2016-02-13 VITALS — BP 130/58 | HR 72 | Ht 67.0 in | Wt 170.0 lb

## 2016-02-13 DIAGNOSIS — F339 Major depressive disorder, recurrent, unspecified: Secondary | ICD-10-CM | POA: Diagnosis not present

## 2016-02-13 MED ORDER — SERTRALINE HCL 100 MG PO TABS: 100.0000 mg | ORAL_TABLET | Freq: Every day | ORAL | Status: DC

## 2016-02-13 NOTE — Telephone Encounter (Signed)
She came to my desk in [place] today and asked if we can work her in Friday. She fell on her breast and is concerned about it. I put her on tentatively for 10:45 but had to double-book him to get her in. I did explain to patient that this is *TENTATIVE* pending Dr. Gary Fleet approval and that I will let her know if we need to r/s it. Please advise. Thanks!

## 2016-02-13 NOTE — Progress Notes (Signed)
Name: Veronica Ellis   MRN: [id number]    DOB: 1933/10/26   Date:02/13/2016       Progress Note  Subjective  Chief Complaint  Chief Complaint  Patient presents with  . Depression    Depression        This is a chronic problem.  The current episode started more than 1 year ago.   The onset quality is gradual.   The problem has been gradually improving since onset.  Associated symptoms include no decreased concentration, no fatigue, no helplessness, no hopelessness, does not have insomnia, not irritable, no restlessness, no decreased interest, no appetite change, no body aches, no myalgias, no headaches, no indigestion, not sad and no suicidal ideas.     The symptoms are aggravated by nothing.  Past treatments include SSRIs - Selective serotonin reuptake inhibitors.  Compliance with treatment is good.   Pertinent negatives include no chronic fatigue syndrome, no terminal illness and no eating disorder.   No problem-specific assessment & plan notes found for this encounter.   Past Medical History  Diagnosis Date  . Hypertension   . Anxiety   . Depression   . Cancer [facility])     breast  . Breast cancer ([place]) 2014    bilateral invasive mammary carcinoma. with 36 rad tx.     Past Surgical History  Procedure Laterality Date  . Appendectomy    . Tubal ligation    . Colon surgery      12 in. removed due to polyps    Family History  Problem Relation Age of Onset  . Breast cancer Daughter 69    Social History   Social History  . Marital Status: Widowed    Spouse Name: N/A  . Number of Children: N/A  . Years of Education: N/A   Occupational History  . Not on file.   Social History Main Topics  . Smoking status: Current Every Day Smoker  . Smokeless tobacco: Not on file  . Alcohol Use: 0.0 oz/week    0 Standard drinks or equivalent per week  . Drug Use: No  . Sexual Activity: Yes   Other Topics Concern  . Not on file   Social History Narrative    Allergies   Allergen Reactions  . Erythromycin   . Iodine   . Penicillins   . Shellfish Allergy      Review of Systems  Constitutional: Negative for fever, chills, weight loss, malaise/fatigue, appetite change and fatigue.  HENT: Negative for ear discharge, ear pain and sore throat.   Eyes: Negative for blurred vision.  Respiratory: Negative for cough, sputum production, shortness of breath and wheezing.   Cardiovascular: Negative for chest pain, palpitations and leg swelling.  Gastrointestinal: Negative for heartburn, nausea, abdominal pain, diarrhea, constipation, blood in stool and melena.  Genitourinary: Negative for dysuria, urgency, frequency and hematuria.  Musculoskeletal: Negative for myalgias, back pain, joint pain and neck pain.  Skin: Negative for rash.  Neurological: Negative for dizziness, tingling, sensory change, focal weakness and headaches.  Endo/Heme/Allergies: Negative for environmental allergies and polydipsia. Does not bruise/bleed easily.  Psychiatric/Behavioral: Positive for depression. Negative for suicidal ideas and decreased concentration. The patient is not nervous/anxious and does not have insomnia.      Objective  Filed Vitals:   02/13/16 1339  BP: 130/58  Pulse: 72  Height: 5\' 7"  (1.702 m)  Weight: 170 lb (77.111 kg)    Physical Exam  Constitutional: She is well-developed, well-nourished, and in no distress.  She is not irritable. No distress.  HENT:  Head: Normocephalic and atraumatic.  Right Ear: External ear normal.  Left Ear: External ear normal.  Nose: Nose normal.  Mouth/Throat: Oropharynx is clear and moist.  Eyes: Conjunctivae and EOM are normal. Pupils are equal, round, and reactive to light. Right eye exhibits no discharge. Left eye exhibits no discharge.  Neck: Normal range of motion. Neck supple. No JVD present. No thyromegaly present.  Cardiovascular: Normal rate, regular rhythm, normal heart sounds and intact distal pulses.  Exam reveals  no gallop and no friction rub.   No murmur heard. Pulmonary/Chest: Effort normal and breath sounds normal.  Abdominal: Soft. Bowel sounds are normal. She exhibits no mass. There is no tenderness. There is no guarding.  Musculoskeletal: Normal range of motion. She exhibits no edema.  Lymphadenopathy:    She has no cervical adenopathy.  Neurological: She is alert.  Skin: Skin is warm and dry. She is not diaphoretic.  Psychiatric: Mood and affect normal.  Nursing note and vitals reviewed.     Assessment & Plan  Problem List Items Addressed This Visit      Other   Recurrent major depressive episodes ([place]) - Primary   Relevant Medications   sertraline (ZOLOFT) 100 MG tablet        Dr. Lamiya Naas [facility]  02/13/2016

## 2016-02-13 NOTE — Telephone Encounter (Signed)
Sure.  I think Veronica Ellis will be in [place] on Friday. I'll have to leave at noon to get back to [place] for call.

## 2016-02-17 ENCOUNTER — Encounter: Payer: Self-pay | Admitting: Oncology

## 2016-02-17 ENCOUNTER — Inpatient Hospital Stay: Payer: PPO | Attending: Oncology | Admitting: Oncology

## 2016-02-17 VITALS — BP 150/66 | HR 67 | Temp 99.1°F | Ht 67.0 in | Wt 170.6 lb

## 2016-02-17 DIAGNOSIS — Z170 Estrogen receptor positive status [ER+]: Secondary | ICD-10-CM | POA: Insufficient documentation

## 2016-02-17 DIAGNOSIS — M8580 Other specified disorders of bone density and structure, unspecified site: Secondary | ICD-10-CM | POA: Diagnosis not present

## 2016-02-17 DIAGNOSIS — F419 Anxiety disorder, unspecified: Secondary | ICD-10-CM | POA: Diagnosis not present

## 2016-02-17 DIAGNOSIS — Z853 Personal history of malignant neoplasm of breast: Secondary | ICD-10-CM

## 2016-02-17 DIAGNOSIS — Z923 Personal history of irradiation: Secondary | ICD-10-CM | POA: Insufficient documentation

## 2016-02-17 DIAGNOSIS — C50912 Malignant neoplasm of unspecified site of left female breast: Secondary | ICD-10-CM | POA: Diagnosis not present

## 2016-02-17 DIAGNOSIS — N644 Mastodynia: Secondary | ICD-10-CM | POA: Diagnosis not present

## 2016-02-17 DIAGNOSIS — Z79811 Long term (current) use of aromatase inhibitors: Secondary | ICD-10-CM | POA: Insufficient documentation

## 2016-02-17 DIAGNOSIS — C50911 Malignant neoplasm of unspecified site of right female breast: Secondary | ICD-10-CM | POA: Diagnosis not present

## 2016-02-17 DIAGNOSIS — Z87891 Personal history of nicotine dependence: Secondary | ICD-10-CM | POA: Diagnosis not present

## 2016-02-17 DIAGNOSIS — Z9181 History of falling: Secondary | ICD-10-CM | POA: Diagnosis not present

## 2016-02-17 DIAGNOSIS — I10 Essential (primary) hypertension: Secondary | ICD-10-CM | POA: Diagnosis not present

## 2016-02-17 DIAGNOSIS — Z7982 Long term (current) use of aspirin: Secondary | ICD-10-CM | POA: Diagnosis not present

## 2016-02-17 DIAGNOSIS — R413 Other amnesia: Secondary | ICD-10-CM | POA: Insufficient documentation

## 2016-02-17 DIAGNOSIS — Z79899 Other long term (current) drug therapy: Secondary | ICD-10-CM | POA: Diagnosis not present

## 2016-02-20 ENCOUNTER — Other Ambulatory Visit: Payer: Self-pay | Admitting: Oncology

## 2016-02-20 DIAGNOSIS — C50911 Malignant neoplasm of unspecified site of right female breast: Secondary | ICD-10-CM

## 2016-02-20 DIAGNOSIS — C50912 Malignant neoplasm of unspecified site of left female breast: Principal | ICD-10-CM

## 2016-02-22 ENCOUNTER — Ambulatory Visit (INDEPENDENT_AMBULATORY_CARE_PROVIDER_SITE_OTHER): Payer: PPO | Admitting: Family Medicine

## 2016-02-22 ENCOUNTER — Encounter: Payer: Self-pay | Admitting: Family Medicine

## 2016-02-22 VITALS — BP 130/70 | HR 70 | Ht 67.0 in | Wt 170.0 lb

## 2016-02-22 DIAGNOSIS — K529 Noninfective gastroenteritis and colitis, unspecified: Secondary | ICD-10-CM

## 2016-02-22 NOTE — Progress Notes (Signed)
Name: Veronica Ellis   MRN: [id number]    DOB: 07/09/1934   Date:02/22/2016       Progress Note  Subjective  Chief Complaint  Chief Complaint  Patient presents with  . tape worm    has seen a "white worm about the size of a nickel in stool"    Diarrhea  This is a new problem. The current episode started more than 1 year ago (white "something" in stool). The problem has been unchanged. The stool consistency is described as blood tinged. Associated symptoms include abdominal pain and bloating. Pertinent negatives include no chills, coughing, fever, headaches, myalgias or weight loss. She has tried nothing (carrot juice) for the symptoms. The treatment provided mild relief.    No problem-specific assessment & plan notes found for this encounter.   Past Medical History  Diagnosis Date  . Hypertension   . Anxiety   . Depression   . Cancer [facility])     breast  . Breast cancer ([place]) 2014    bilateral invasive mammary carcinoma. with 36 rad tx.     Past Surgical History  Procedure Laterality Date  . Appendectomy    . Tubal ligation    . Colon surgery      12 in. removed due to polyps    Family History  Problem Relation Age of Onset  . Breast cancer Daughter 75    Social History   Social History  . Marital Status: Widowed    Spouse Name: N/A  . Number of Children: N/A  . Years of Education: N/A   Occupational History  . Not on file.   Social History Main Topics  . Smoking status: Former Smoker    Quit date: 02/17/2015  . Smokeless tobacco: Not on file  . Alcohol Use: 0.0 oz/week    0 Standard drinks or equivalent per week  . Drug Use: No  . Sexual Activity: Yes   Other Topics Concern  . Not on file   Social History Narrative    Allergies  Allergen Reactions  . Erythromycin   . Iodine   . Penicillins   . Shellfish Allergy      Review of Systems  Constitutional: Negative for fever, chills, weight loss and malaise/fatigue.  HENT: Negative for ear  discharge, ear pain and sore throat.   Eyes: Negative for blurred vision.  Respiratory: Negative for cough, sputum production, shortness of breath and wheezing.   Cardiovascular: Negative for chest pain, palpitations and leg swelling.  Gastrointestinal: Positive for abdominal pain, diarrhea and bloating. Negative for heartburn, nausea, constipation, blood in stool and melena.  Genitourinary: Negative for dysuria, urgency, frequency and hematuria.  Musculoskeletal: Negative for myalgias, back pain, joint pain and neck pain.  Skin: Negative for rash.  Neurological: Negative for dizziness, tingling, sensory change, focal weakness and headaches.  Endo/Heme/Allergies: Negative for environmental allergies and polydipsia. Does not bruise/bleed easily.  Psychiatric/Behavioral: Negative for depression and suicidal ideas. The patient is not nervous/anxious and does not have insomnia.      Objective  Filed Vitals:   02/22/16 1558  BP: 130/70  Pulse: 70  Height: 5\' 7"  (1.702 m)  Weight: 170 lb (77.111 kg)    Physical Exam  Constitutional: She is well-developed, well-nourished, and in no distress. No distress.  HENT:  Head: Normocephalic and atraumatic.  Right Ear: External ear normal.  Left Ear: External ear normal.  Nose: Nose normal.  Mouth/Throat: Oropharynx is clear and moist.  Eyes: Conjunctivae and EOM are normal.  Pupils are equal, round, and reactive to light. Right eye exhibits no discharge. Left eye exhibits no discharge.  Neck: Normal range of motion. Neck supple. No JVD present. No thyromegaly present.  Cardiovascular: Normal rate, regular rhythm, normal heart sounds and intact distal pulses.  Exam reveals no gallop and no friction rub.   No murmur heard. Pulmonary/Chest: Effort normal and breath sounds normal. She has no wheezes. She has no rales.  Abdominal: Soft. Bowel sounds are normal. She exhibits no mass. There is no hepatosplenomegaly. There is no tenderness. There is no  guarding.  Musculoskeletal: Normal range of motion. She exhibits no edema.  Lymphadenopathy:    She has no cervical adenopathy.  Neurological: She is alert. She has normal reflexes.  Skin: Skin is warm and dry. She is not diaphoretic.  Psychiatric: Mood and affect normal.  Nursing note and vitals reviewed.     Assessment & Plan  Problem List Items Addressed This Visit    None    Visit Diagnoses    Enteritis    -  Primary    ? ova and parasites    Relevant Orders    Ova and parasite examination    Stool Culture         Dr. Otilio Miu [facility]  02/22/2016

## 2016-02-22 NOTE — Patient Instructions (Signed)
Stool Culture WHY AM I HAVING THIS TEST? A stool culture tests your stool (feces) for infections of the intestines that are caused by bacteria, a virus, or parasites. Your health care provider may order this test if you have a fever, abdominal pain, and diarrhea that lasts for more than a few days. WHAT KIND OF SAMPLE IS TAKEN? A stool sample is required for this test. You will collect the sample when you have a bowel movement. WILL I NEED TO COLLECT SAMPLES AT HOME? You will collect a sample of your stool at home. Your health care provider will give you instructions. Carefully follow them each time you collect a sample. Your health care provider will also give you all of the supplies that you need. For each sample that you collect, you may get:  A small container. You may be given different colored containers. Each container may come with different instructions.  Gloves that can be thrown away.  A plastic bag. When collecting the sample:  Do not pour out the fluid that is in the container. This fluid will preserve your sample.  Choose the parts of the sample that are bloody, slimy, or watery.  If your stool is hard, choose samples from each end and the middle.  Do not mix urine, toilet paper, or water with your sample. You may be instructed to collect the sample in the following way:  Before you collect the sample:  Cover the toilet bowl with plastic wrap or a plastic bag.  Tape the wrap or bag to the bowl of the toilet, not to the seat. Do not stretch the plastic tight across the bowl. Leave room for your stool to fall during your bowel movement.  Wash and dry any containers that you were given. Keep them in the bathroom.  When you are ready to collect a sample:  Wash your hands. thoroughly with soap and water.  Put on the gloves that were provided to you.  Using the small shovel that is built into the top of the container, put small scoops of your stool into the container.  Fill the container up to the red line on the label.  Use the shovel to stir the stool sample into the liquid in the container if directed to do so by the instructions that came with the collection container.  Close the lid of the collection container tightly.  Shake the sample until it is well mixed.  On the label, write the date, time, and your initials.  Put the container in the plastic bag that was given to you.  Flush the rest of your stool down the toilet. Throw away the gloves.  Wash your hands thoroughly with soap and water.  Store the sample using the instructions on the container that you used to collect the sample.  Repeat this procedure at a later time if you need to collect another sample. Additional samples should be collected at different times.  Return the sample or samples to your health care provider shortly after you collect them. Check the instructions about when they need to be returned. HOW DO I PREPARE FOR THIS TEST? If you are a woman and are menstruating, wait 3 days after the end of your menstrual cycle before you collect a sample.  WHAT DO THE RESULTS MEAN? It is your responsibility to obtain your test results. Ask the lab or department performing the test when and how you will get your results. Normal results include:  Normal intestinal flora.  This means that the bacteria and fungi that are present are typically found in your intestines and are present in normal amounts.  No ova or parasite infestation. This means that there is no evidence of parasites in your intestines. Abnormal results will specify the bacteria, virus, or parasite that is responsible for the infection. Talk with your health care provider to discuss your results, treatment options, and if necessary, the need for more tests. Talk with your health care provider if you have any questions about your results.   This information is not intended to replace advice given to you by your health care  provider. Make sure you discuss any questions you have with your health care provider.   Document Released: 09/29/2010 Document Revised: 09/17/2014 Document Reviewed: 01/15/2014 Elsevier Interactive Patient Education 2016 [place] and Parasite Stool Exam WHY AM I HAVING THIS TEST? The ova and parasite stool exam is a microscopic exam of your stool (feces) to determine whether a parasite is infecting your gastrointestinal tract. It is sometimes called a stool for ova and parasites (stool for O&P) exam. Your health care provider may order this test if you have a fever, abdominal bloating, and diarrhea that lasts for more than a few days. The exam may also be done if you have blood or mucus in your loose stools. It is often done for people who have these symptoms:  After drinking water from a well, creek, or river.  After taking antibiotic medicines for a long time to treat another illness.  After traveling internationally. The most common parasites to infect the intestinal tract in people include:  Giardia intestinalis.  Hookworm (Ascaris species).  Tapeworm (Strongyloides species).  Cryptosporidium. WHAT KIND OF SAMPLE IS TAKEN? A stool sample is required for this test.  WILL I NEED TO COLLECT SAMPLES AT HOME? You will likely be asked to collect a stool sample at home and bring it to the lab or office for testing. Follow your health care provider's instructions about how to collect a stool sample. This will often involve these steps:  Cover the toilet bowl with plastic wrap or a plastic bag. Tape the wrap or bag to the bowl of the toilet, not to the seat. Do not stretch the plastic tight across the bowl. Leave room for your stool to fall during your bowel movement. You can also use a clean plastic container if directed to do so.  Wash your hands thoroughly with soap and water.  Open the collection container given to you by your health care provider or the lab. Do not  pour out  the liquid that is in the container. This liquid will preserve your stool sample until it is tested in the lab.  Have a bowel movement (defecate) into the plastic bag, wrap, or container that is secured to the toilet.  Use the small shovel that comes with the stool collection container to collect several scoops of stool from the plastic wrap, bag, or container. Place these scoops into the collection container. If there is any blood or mucus in your stool, collect that and place it in the collection container as well.  Stir the stool sample into the liquid in the container if directed to do so by the instructions that came with the collection container.  Close the lid of the collection container tightly. If directed to do so by the instructions, you may need to gently mix the stool and liquid by turning the container upside down several times.  On the label, write the date, time, and your initials.  Put the container in the bag that was given to you.  If your health care provider wants you to collect more than one sample, collect them at different times as you have been directed.  Flush the rest of the stool from your bowel movement down the toilet.  Wash your hands thoroughly with soap and water. HOW ARE THE TEST RESULTS REPORTED?  Your test results will be reported as either positive or negative for ova and parasites. It is your responsibility to obtain your test results. Ask the lab or department performing the test when and how you will get your results. WHAT DO THE RESULTS MEAN? A positive (abnormal) test means that you have a parasite infection in your intestinal tract. The test results will specify which type of parasite is present. Talk with your health care provider to discuss your results, treatment options, and if necessary, the need for more tests. Talk with your health care provider if you have any questions about your results.   This information is not intended to replace advice  given to you by your health care provider. Make sure you discuss any questions you have with your health care provider.   Document Released: 09/29/2004 Document Revised: 09/17/2014 Document Reviewed: 01/18/2014 Elsevier Interactive Patient Education [employer].

## 2016-02-26 NOTE — Progress Notes (Signed)
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  INTERVAL HISTORY: Patient last evaluated in clinic in August 2016. She has missed multiple appointments in the interim. Patient is concerned about some right breast tenderness from a fall this past March. She continues to be highly anxious, but otherwise feels well. She denies any recent fevers or illnesses. She denies any weight loss. She denies any chest pain or shortness of breath.  She denies any nausea, vomiting, constipation, or diarrhea.  She has no melena or hematochezia.  She has no urinary complaints.  Patient offers no further specific complaints today.   REVIEW OF SYSTEMS:   Review of Systems  Constitutional: Negative.  Negative for fever, weight loss and malaise/fatigue.  Respiratory: Negative.  Negative for shortness of breath.   Cardiovascular: Negative.  Negative for chest pain.  Gastrointestinal: Negative.   Genitourinary: Negative.   Musculoskeletal: Negative.   Neurological: Positive for sensory change. Negative for weakness.  Psychiatric/Behavioral: Positive for memory loss. The patient is nervous/anxious.     As per HPI. Otherwise, a complete review of systems is negatve.  PAST MEDICAL HISTORY: Past Medical History  Diagnosis Date  . Hypertension   . Anxiety   . Depression   . Cancer [facility])     breast  . Breast cancer ([place]) 2014    bilateral invasive mammary carcinoma. with 36 rad tx.     PAST SURGICAL HISTORY: Past Surgical History  Procedure Laterality Date  . Appendectomy    . Tubal ligation    . Colon surgery      12 in. removed due to polyps    FAMILY  HISTORY Family History  Problem Relation Age of Onset  . Breast cancer Daughter 19       ADVANCED DIRECTIVES:    HEALTH MAINTENANCE: Social History  Substance Use Topics  . Smoking status: Former Smoker    Quit date: 02/17/2015  . Smokeless tobacco: None  . Alcohol Use: 0.0 oz/week    0 Standard drinks or equivalent per week     Colonoscopy:  PAP:  Bone density:  Lipid panel:  Allergies  Allergen Reactions  . Erythromycin   . Iodine   . Penicillins   . Shellfish Allergy     Current Outpatient Prescriptions  Medication Sig Dispense Refill  . aspirin EC 81 MG tablet Take by mouth.    . letrozole (FEMARA) 2.5 MG tablet Take 1 tablet (2.5 mg total) by mouth daily. 30 tablet 1  . sertraline (ZOLOFT) 100 MG tablet Take 1 tablet (100 mg total) by mouth daily. 30 tablet 5   Current Facility-Administered Medications  Medication Dose Route Frequency Provider Last Rate Last Dose  . albuterol (PROVENTIL) (2.5 MG/3ML) 0.083% nebulizer solution 2.5 mg  2.5 mg Nebulization Once Juline Patch, MD        OBJECTIVE: Filed Vitals:   02/17/16 1140  BP: 150/66  Pulse: 67  Temp: 99.1 F (37.3 C)     Body mass index is 26.72 kg/(m^2).    ECOG FS:0 - Asymptomatic  General: Well-developed, well-nourished, no acute distress. Eyes: Pink conjunctiva, anicteric sclera. Breasts: Bilateral breasts and axilla without lumps or masses. Heart: Regular rate and rhythm.  No rubs, murmurs, or gallops. Lungs: Clear to auscultation bilaterally. Abdomen: Soft, nontender, nondistended. No organomegaly noted, normoactive bowel sounds. Musculoskeletal: No edema, cyanosis, or clubbing. Neuro: Alert, answering all questions appropriately. Cranial nerves grossly intact. Skin: No rashes or petechiae noted. Psych: Normal affect.   LAB RESULTS:  Lab Results  Component Value Date   NA 138 04/30/2014   K 4.7 04/30/2014   CL 105 04/30/2014   CO2 24 04/30/2014   GLUCOSE 97 04/30/2014   BUN 10  04/30/2014   CREATININE 1.06 10/13/2014   CALCIUM 8.5 04/30/2014   PROT 7.0 04/29/2014   ALBUMIN 2.3* 04/29/2014   AST 36 04/29/2014   ALT 53 04/29/2014   ALKPHOS 143* 04/29/2014   BILITOT 0.3 04/29/2014   GFRNONAA 53* 10/13/2014   GFRAA >60 10/13/2014    Lab Results  Component Value Date   WBC 9.4 10/15/2014   NEUTROABS 5.3 10/15/2014   HGB 11.8* 10/15/2014   HCT 36.7 10/15/2014   MCV 78* 10/15/2014   PLT 328 10/15/2014     STUDIES: No results found.  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  PLAN:    1.  Bilateral breast cancer:  No evidence of disease.  Previously after lengthy discussion with patient and her family about her intermediate Oncotype score, she declined adjuvant chemotherapy.  Continue letrozole completing in January 2020.  Patient's most recent mammogram on May 31, 2015 was reported as BI-RADS 2, repeat in September 2017.  Return to clinic in 6 months for routine evaluation.   2.  Chest CT abnormalities: No evidence of malignancy. No further intervention or imaging is necessary. 3. Falls/neuropathy: Treatment per neurology. 4. Osteopenia: Patient's most recent bone mineral density in September 2016 was reported as -2.0 which is unchanged from 2 years prior when her T score was -1.9.  Approximately 30 minutes was spent in discussion of which greater than 50% was consultation.  Patient expressed understanding and was in agreement with this plan. She also understands that She can call clinic at any time with any questions, concerns, or complaints.    Lloyd Huger, MD   02/26/2016 8:52 AM

## 2016-02-27 ENCOUNTER — Telehealth: Payer: Self-pay | Admitting: *Deleted

## 2016-02-27 MED ORDER — LETROZOLE 2.5 MG PO TABS: 2.5000 mg | ORAL_TABLET | Freq: Every day | ORAL | Status: DC

## 2016-02-27 NOTE — Telephone Encounter (Signed)
-----   Message from Wallene Dales sent at 02/27/2016  1:52 PM EDT ----- Regarding: Pt needs medication refil Please call pt. She needs med refill and pharmacist will not fax over request per pt. [phone].

## 2016-04-19 ENCOUNTER — Ambulatory Visit (INDEPENDENT_AMBULATORY_CARE_PROVIDER_SITE_OTHER): Payer: PPO | Admitting: Family Medicine

## 2016-04-19 ENCOUNTER — Encounter: Payer: Self-pay | Admitting: Family Medicine

## 2016-04-19 ENCOUNTER — Other Ambulatory Visit
Admission: RE | Admit: 2016-04-19 | Discharge: 2016-04-19 | Disposition: A | Discharge status: Home / Self Care | Payer: PPO | Source: Ambulatory Visit | Attending: Family Medicine | Admitting: Family Medicine

## 2016-04-19 VITALS — BP 130/70 | HR 72 | Temp 99.5°F | Ht 67.0 in | Wt 163.0 lb

## 2016-04-19 DIAGNOSIS — R531 Weakness: Secondary | ICD-10-CM | POA: Insufficient documentation

## 2016-04-19 DIAGNOSIS — W57XXXA Bitten or stung by nonvenomous insect and other nonvenomous arthropods, initial encounter: Secondary | ICD-10-CM | POA: Diagnosis not present

## 2016-04-19 DIAGNOSIS — A938 Other specified arthropod-borne viral fevers: Secondary | ICD-10-CM | POA: Insufficient documentation

## 2016-04-19 DIAGNOSIS — R509 Fever, unspecified: Secondary | ICD-10-CM | POA: Insufficient documentation

## 2016-04-19 DIAGNOSIS — T148 Other injury of unspecified body region: Secondary | ICD-10-CM

## 2016-04-19 MED ORDER — DOXYCYCLINE HYCLATE 100 MG PO TABS: 100.0000 mg | ORAL_TABLET | Freq: Two times a day (BID) | ORAL | 0 refills | Status: DC

## 2016-04-19 NOTE — Progress Notes (Signed)
Name: Veronica Ellis   MRN: [id number]    DOB: 01-02-1934   Date:04/19/2016       Progress Note  Subjective  Chief Complaint  Chief Complaint  Patient presents with  . Insect Bite    pulled tick off "my bottom" x 2 weeks ago  . Hypertension    needs refill on Isosorbide    Hypertension  This is a chronic problem. The current episode started more than 1 year ago. The problem has been gradually improving since onset. The problem is controlled. Associated symptoms include headaches. Pertinent negatives include no anxiety, blurred vision, chest pain, malaise/fatigue, neck pain, orthopnea, palpitations, peripheral edema, PND, shortness of breath or sweats. There are no associated agents to hypertension. There are no known risk factors for coronary artery disease. Past treatments include nothing. The current treatment provides mild improvement. There is no history of angina, kidney disease, CAD/MI, CVA, heart failure, left ventricular hypertrophy, PVD, renovascular disease or retinopathy. There is no history of chronic renal disease or a hypertension causing med.    No problem-specific Assessment & Plan notes found for this encounter.   Past Medical History:  Diagnosis Date  . Anxiety   . Breast cancer ([place]) 2014   bilateral invasive mammary carcinoma. with 36 rad tx.   . Cancer ([place])    breast  . Depression   . Hypertension     Past Surgical History:  Procedure Laterality Date  . APPENDECTOMY    . COLON SURGERY     12 in. removed due to polyps  . TUBAL LIGATION      Family History  Problem Relation Age of Onset  . Breast cancer Daughter 82    Social History   Social History  . Marital status: Widowed    Spouse name: N/A  . Number of children: N/A  . Years of education: N/A   Occupational History  . Not on file.   Social History Main Topics  . Smoking status: Former Smoker    Quit date: 02/17/2015  . Smokeless tobacco: Not on file  . Alcohol use 0.0 oz/week  .  Drug use: No  . Sexual activity: Yes   Other Topics Concern  . Not on file   Social History Narrative  . No narrative on file    Allergies  Allergen Reactions  . Erythromycin   . Iodine   . Penicillins   . Shellfish Allergy      Review of Systems  Constitutional: Positive for fever. Negative for chills, malaise/fatigue and weight loss.  HENT: Negative for ear discharge, ear pain and sore throat.   Eyes: Negative for blurred vision.  Respiratory: Negative for cough, sputum production, shortness of breath and wheezing.   Cardiovascular: Negative for chest pain, palpitations, orthopnea, leg swelling and PND.  Gastrointestinal: Negative for abdominal pain, blood in stool, constipation, diarrhea, heartburn, melena and nausea.  Genitourinary: Negative for dysuria, frequency, hematuria and urgency.  Musculoskeletal: Negative for back pain, joint pain, myalgias and neck pain.  Skin: Negative for rash.  Neurological: Positive for headaches. Negative for dizziness, tingling, sensory change and focal weakness.  Endo/Heme/Allergies: Negative for environmental allergies and polydipsia. Does not bruise/bleed easily.  Psychiatric/Behavioral: Negative for depression and suicidal ideas. The patient is not nervous/anxious and does not have insomnia.      Objective  Vitals:   04/19/16 1516  BP: 130/70  Pulse: 72  Temp: 99.5 F (37.5 C)  Weight: 163 lb (73.9 kg)  Height: 5\' 7"  (1.702 m)  Physical Exam  Constitutional: She is well-developed, well-nourished, and in no distress. No distress.  HENT:  Head: Normocephalic and atraumatic.  Right Ear: External ear normal.  Left Ear: External ear normal.  Nose: Nose normal.  Mouth/Throat: Oropharynx is clear and moist.  Eyes: Conjunctivae and EOM are normal. Pupils are equal, round, and reactive to light. Right eye exhibits no discharge. Left eye exhibits no discharge.  Neck: Normal range of motion. Neck supple. No JVD present. No  thyromegaly present.  Cardiovascular: Normal rate, regular rhythm, normal heart sounds and intact distal pulses.  Exam reveals no gallop and no friction rub.   No murmur heard. Pulmonary/Chest: Effort normal and breath sounds normal. She has no wheezes. She has no rales.  Abdominal: Soft. Bowel sounds are normal. She exhibits no mass. There is no tenderness. There is no guarding.  Musculoskeletal: Normal range of motion. She exhibits no edema.  Lymphadenopathy:    She has no cervical adenopathy.  Neurological: She is alert.  Skin: Skin is warm and dry. No rash noted. She is not diaphoretic. No erythema.  Psychiatric: Mood and affect normal.  Nursing note and vitals reviewed.     Assessment & Plan  Problem List Items Addressed This Visit    None    Visit Diagnoses    Tick bite    -  Primary   Relevant Medications   doxycycline (VIBRA-TABS) 100 MG tablet   Other Relevant Orders   Rocky mtn spotted fvr ab, IgG-blood   Rocky mtn spotted fvr ab, IgM-blood        Dr. [facility]  04/19/16

## 2016-04-22 ENCOUNTER — Encounter: Payer: Self-pay | Admitting: Intensive Care

## 2016-04-22 ENCOUNTER — Emergency Department
Admission: EM | Admit: 2016-04-22 | Discharge: 2016-04-22 | Disposition: A | Discharge status: Home / Self Care | Payer: PPO | Attending: Emergency Medicine | Admitting: Emergency Medicine

## 2016-04-22 DIAGNOSIS — Z853 Personal history of malignant neoplasm of breast: Secondary | ICD-10-CM | POA: Insufficient documentation

## 2016-04-22 DIAGNOSIS — I10 Essential (primary) hypertension: Secondary | ICD-10-CM | POA: Insufficient documentation

## 2016-04-22 DIAGNOSIS — Z87891 Personal history of nicotine dependence: Secondary | ICD-10-CM | POA: Insufficient documentation

## 2016-04-22 DIAGNOSIS — R112 Nausea with vomiting, unspecified: Secondary | ICD-10-CM | POA: Insufficient documentation

## 2016-04-22 HISTORY — DX: Hyperlipidemia, unspecified: E78.5

## 2016-04-22 LAB — CBC
HEMATOCRIT: 37.8 % (ref 35.0–47.0)
HEMOGLOBIN: 12.1 g/dL (ref 12.0–16.0)
MCH: 23.9 pg — AB (ref 26.0–34.0)
MCHC: 32 g/dL (ref 32.0–36.0)
MCV: 74.7 fL — AB (ref 80.0–100.0)
Platelets: 343 10*3/uL (ref 150–440)
RBC: 5.06 MIL/uL (ref 3.80–5.20)
RDW: 17.1 % — ABNORMAL HIGH (ref 11.5–14.5)
WBC: 6.4 10*3/uL (ref 3.6–11.0)

## 2016-04-22 LAB — LIPASE, BLOOD: LIPASE: 30 U/L (ref 11–51)

## 2016-04-22 LAB — COMPREHENSIVE METABOLIC PANEL
ALBUMIN: 3.9 g/dL (ref 3.5–5.0)
ALK PHOS: 78 U/L (ref 38–126)
ALT: 13 U/L — ABNORMAL LOW (ref 14–54)
ANION GAP: 9 (ref 5–15)
AST: 17 U/L (ref 15–41)
BUN: 15 mg/dL (ref 6–20)
CALCIUM: 9.2 mg/dL (ref 8.9–10.3)
CHLORIDE: 106 mmol/L (ref 101–111)
CO2: 23 mmol/L (ref 22–32)
Creatinine, Ser: 0.74 mg/dL (ref 0.44–1.00)
GFR calc non Af Amer: >60 mL/min (ref >60)
GLUCOSE: 109 mg/dL — AB (ref 65–99)
POTASSIUM: 3.8 mmol/L (ref 3.5–5.1)
SODIUM: 138 mmol/L (ref 135–145)
Total Bilirubin: 0.8 mg/dL (ref 0.3–1.2)
Total Protein: 7.3 g/dL (ref 6.5–8.1)

## 2016-04-22 LAB — TROPONIN I

## 2016-04-22 MED ORDER — ONDANSETRON 4 MG PO TBDP: 4.0000 mg | ORAL_TABLET | Freq: Four times a day (QID) | ORAL | 0 refills | Status: DC | PRN

## 2016-04-22 MED ORDER — ONDANSETRON 4 MG PO TBDP
4.0000 mg | ORAL_TABLET | Freq: Once | ORAL | Status: AC | PRN
  Administered 2016-04-22: 4 mg via ORAL

## 2016-04-22 MED ORDER — SODIUM CHLORIDE 0.9 % IV BOLUS (SEPSIS)
500.0000 mL | Freq: Once | INTRAVENOUS | Status: AC
  Administered 2016-04-22: 500 mL via INTRAVENOUS

## 2016-04-22 NOTE — ED Provider Notes (Signed)
[facility] Emergency Department Provider Note   ____________________________________________   First MD Initiated Contact with Patient 04/22/16 1311     (approximate)  I have reviewed the triage vital signs and the nursing notes.   HISTORY  Chief Complaint Emesis and Tick Removal    HPI Veronica Ellis is a 82 y.o. female reports that about 2 weeks ago she scratched something off her inner left thigh, and she is not sure if it could've been a tick. She reports that since then she's been feeling slightly more fatigued, achy at times, and having occasional lightheadedness. She saw her doctor who placed her on doxycycline on Friday. She reports she took her first dose yesterday morning, and thereafter she started noticing crampy abdominal pain and vomited twice. She reports she thinks it might be from the medication, but she is continued to have mild nausea and cramping after taking another dose this morning. She has not vomited today.  No shortness of breath or chest pain, though after she took the medicine yesterday she felt a slight discomfort in the upper abdomen versus lower chest before she recanted feeling very nauseated with crampy abdominal pain. No loose stools or diarrhea.  The patient denies having had a fever, she denies having a rash, and she also reports she is not really sure that there was a tick. Certainly she did not find an engorged take and did not have to do removal of it.Does report that she did not drink as much as normal yesterday and her urine seems slightly darker.   Past Medical History:  Diagnosis Date  . Anxiety   . Breast cancer ([place]) 2014   bilateral invasive mammary carcinoma. with 36 rad tx.   . Cancer ([place])    breast  . Depression   . Hyperlipemia   . Hypertension     Patient Active Problem List   Diagnosis Date Noted  . Bilateral breast cancer ([place]) 02/20/2016  . Familial multiple lipoprotein-type hyperlipidemia  02/16/2015  . Cerebrovascular disease 02/16/2015  . Anxiety 02/16/2015  . Breast lump 02/16/2015  . 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 Routine general medical examination at a health care facility 02/16/2015  . Recurrent major depressive episodes ([place]) 02/16/2015  . Below normal amount of sodium in the blood 02/16/2015  . Pre-operative cardiovascular examination 02/16/2015    Past Surgical History:  Procedure Laterality Date  . APPENDECTOMY    . BREAST SURGERY Bilateral   . COLON SURGERY     12 in. removed due to polyps  . TUBAL LIGATION      Prior to Admission medications   Medication Sig Start Date End Date Taking? Authorizing Provider  aspirin EC 81 MG tablet Take by mouth.    Historical Provider, MD  doxycycline (VIBRA-TABS) 100 MG tablet Take 1 tablet (100 mg total) by mouth 2 (two) times daily. 04/19/16   Juline Patch, MD  isosorbide mononitrate (IMDUR) 30 MG 24 hr tablet Take 1 tablet by mouth daily. 01/20/16   Historical Provider, MD  letrozole (FEMARA) 2.5 MG tablet Take 1 tablet (2.5 mg total) by mouth daily. 02/27/16   Lloyd Huger, MD  ondansetron (ZOFRAN ODT) 4 MG disintegrating tablet Take 1 tablet (4 mg total) by mouth every 6 (six) hours as needed for nausea or vomiting. 04/22/16   Delman Kitten, MD  sertraline (ZOLOFT) 100 MG tablet Take 1 tablet (100 mg total) by mouth daily. 02/13/16  Juline Patch, MD    Allergies Erythromycin; Iodine; Penicillins; and Shellfish allergy  Family History  Problem Relation Age of Onset  . Breast cancer Daughter 44    Social History Social History  Substance Use Topics  . Smoking status: Former Smoker    Types: Cigarettes  . Smokeless tobacco: Never Used  . Alcohol use 0.0 oz/week     Comment: Occ    Review of Systems Constitutional: No fever/chills Eyes: No visual changes. ENT: No sore throat. Cardiovascular: Denies chest pain.See history of present  illness Respiratory: Denies shortness of breath. Occasionally experiences shortness of breath off and on for months, none today. Gastrointestinal: Denies abdominal "pain" per report she felt very "cramp" yesterday. She had nausea this morning but reports all of this went away after receiving nausea medicine in the ER.  No diarrhea.  No constipation. Genitourinary: Negative for dysuria. Musculoskeletal: Negative for back pain. Skin: Negative for rash. Neurological: Negative for headaches, focal weakness or numbness.  10-point ROS otherwise negative.  ____________________________________________   PHYSICAL EXAM:  VITAL SIGNS: ED Triage Vitals  Enc Vitals Group     BP 04/22/16 1209 132/90     Pulse Rate 04/22/16 1209 86     Resp 04/22/16 1209 15     Temp 04/22/16 1209 97.7 F (36.5 C)     Temp Source 04/22/16 1209 Oral     SpO2 04/22/16 1209 97 %     Weight 04/22/16 1209 162 lb (73.5 kg)     Height 04/22/16 1209 5\' 7"  (1.702 m)     Head Circumference --      Peak Flow --      Pain Score 04/22/16 1224 6     Pain Loc --      Pain Edu? --      Excl. in [place]? --     Constitutional: Alert and oriented. Well appearing and in no acute distress. Eyes: Conjunctivae are normal. PERRL. EOMI. Head: Atraumatic. Nose: No congestion/rhinnorhea. Mouth/Throat: Mucous membranes are moist.  Oropharynx non-erythematous. Neck: No stridor.   Cardiovascular: Normal rate, regular rhythm. Grossly normal heart sounds.  Good peripheral circulation. Respiratory: Normal respiratory effort.  No retractions. Lungs CTAB. Gastrointestinal: Soft and nontender. No distention. No abdominal bruits.  Musculoskeletal: No lower extremity tenderness nor edema.  No joint effusions.No rashes noted on the palms or soles of feet. Neurologic:  Normal speech and language. No gross focal neurologic deficits are appreciated. Normal cranial nerve exam. No pronator drift. Skin:  Skin is warm, dry and intact. No rash noted.  The left thigh appears normal. Psychiatric: Mood and affect are normal. Speech and behavior are normal.  ____________________________________________   LABS (all labs ordered are listed, but only abnormal results are displayed)  Labs Reviewed  COMPREHENSIVE METABOLIC PANEL - Abnormal; Notable for the following:       Result Value   Glucose, Bld 109 (*)    ALT 13 (*)    All other components within normal limits  CBC - Abnormal; Notable for the following:    MCV 74.7 (*)    MCH 23.9 (*)    RDW 17.1 (*)    All other components within normal limits  LIPASE, BLOOD  TROPONIN I  URINALYSIS COMPLETEWITH MICROSCOPIC ([facility] ONLY)   ____________________________________________  EKG  Reviewed and interpreted by me at 1205 This very 100 QRS 1:30 QTc 540, but is associated with a left bundle Left bundle branch block, normal sinus rhythm, compared with the patient's previous EKG pre-existing  left bundle. ____________________________________________  RADIOLOGY  I discussed with the patient the risks and benefits of abdominal CT scan and x-ray. The present time there is no clear indication that the patient requires CT, the patient does have an abdominal complaint but exam does not suggest acute surgical abdomen and my suspicion for intra-abdominal infection including appendicitis, cholecystitis, aaa, dissection, ischemia, perforation, pancreatitis, diverticulitis or other acute major intra-abdominal process is quite low. After discussing with the patient and her daughter, she indicates she would not wish for any x-rays or CT today as all of her symptoms are now resolved. Rather if the patient does have worsening symptoms, develops a high fever, develops pain or persistent discomfort in the right upper quadrant or right lower quadrant, or other new concerns arise they will come back to emergency room right away. As the patient's clinician I think this is a very reasonable decision having discussed  general risks and benefits of CT, and my clinical suspicion that CT would be of benefit at this time is very low.  I am in agreement with shared medical decision making plan to forego imaging studies  ____________________________________________   PROCEDURES  Procedure(s) performed: None  Procedures  Critical Care performed: No  ____________________________________________   INITIAL IMPRESSION / ASSESSMENT AND PLAN / ED COURSE  Pertinent labs & imaging results that were available during my care of the patient were reviewed by me and considered in my medical decision making (see chart for details).  Patient presents for nausea and emesis which occurred primarily yesterday with crampy abdominal pain. Proximity to her use of doxycycline is very suspicious for common side effect from doxycycline which is GI upset. The patient reports all symptoms resolved after taking Zofran here. To me, I see no obvious symptom of [facility] spotted fever or Lyme however doctor has initiated treatment. In the setting of concerns by her primary care for RMSF, I think it would be wise for her to continue her doxycycline but we'll give her Zofran and encourage her to your meal while taking to help alleviate symptomatology.  Her labs today are quite reassuring, normal platelet count and white count. She has no abdominal pain or persistent symptoms at this time. We will hydrate her, I will also check a urinalysis.  Clinical Course   ----------------------------------------- 3:24 PM on 04/22/2016 -----------------------------------------  Patient reports that all of her symptoms are gone, she does not wish to wait for urine tests but rather just go home with prescription for Zofran. We discussed benefits of urinalysis, and after discussion she reports she is radiology feels perfect. I will discharge her with a prescription for Zofran and did recommend she attempt to continue her doxycycline, but to  contact her doctor tomorrow regarding cramps and nausea that she is experiencing with it.  Return precautions and treatment recommendations and follow-up discussed with the patient who is agreeable with the plan.    ____________________________________________   FINAL CLINICAL IMPRESSION(S) / ED DIAGNOSES  Final diagnoses:  Non-intractable vomiting with nausea, vomiting of unspecified type      NEW MEDICATIONS STARTED DURING THIS VISIT:  New Prescriptions   ONDANSETRON (ZOFRAN ODT) 4 MG DISINTEGRATING TABLET    Take 1 tablet (4 mg total) by mouth every 6 (six) hours as needed for nausea or vomiting.     Note:  This document was prepared using Dragon voice recognition software and may include unintentional dictation errors.     Delman Kitten, MD 04/22/16 [phone]

## 2016-04-22 NOTE — ED Triage Notes (Signed)
Pt presents to ER with nausea and emesis that she believes is from a tick bite. Pt c/o pain in her back, head, and having blurry vision. Patient reports having a tick X2 weeks ago and last night. Patient has HX of IBS. Also complaining of SOB this past week. Patients breathing is unlabored and appears in NAD during triage. Patient is A&O X4.

## 2016-04-22 NOTE — ED Notes (Signed)
Pt states placed on doxy for a tick bite. Took the medication this am - states was nauseous this am and only drank her coffee then vomited. No c/o at this time after being medicated.

## 2016-04-22 NOTE — ED Notes (Signed)
Pt EKG performed in triage. Vitals taken. Labs drawn. Left AC.

## 2016-04-22 NOTE — Discharge Instructions (Signed)

## 2016-04-24 LAB — ROCKY MTN SPOTTED FVR ABS PNL(IGG+IGM)
RMSF IgG: NEGATIVE
RMSF IgM: 0.95 index — ABNORMAL HIGH (ref 0.00–0.89)

## 2016-05-11 ENCOUNTER — Ambulatory Visit
Admission: EM | Admit: 2016-05-11 | Discharge: 2016-05-11 | Disposition: A | Discharge status: Home / Self Care | Payer: PPO | Attending: Family Medicine | Admitting: Family Medicine

## 2016-05-11 DIAGNOSIS — B3731 Acute candidiasis of vulva and vagina: Secondary | ICD-10-CM

## 2016-05-11 DIAGNOSIS — B373 Candidiasis of vulva and vagina: Secondary | ICD-10-CM

## 2016-05-11 LAB — URINALYSIS COMPLETE WITH MICROSCOPIC (ARMC ONLY)
Bacteria, UA: NONE SEEN
Bilirubin Urine: NEGATIVE
GLUCOSE, UA: NEGATIVE mg/dL
Hgb urine dipstick: NEGATIVE
KETONES UR: NEGATIVE mg/dL
Leukocytes, UA: NEGATIVE
Nitrite: NEGATIVE
PROTEIN: NEGATIVE mg/dL
RBC / HPF: NONE SEEN RBC/hpf (ref 0–5)
SPECIFIC GRAVITY, URINE: 1.025 (ref 1.005–1.030)
pH: 5 (ref 5.0–8.0)

## 2016-05-11 MED ORDER — FLUCONAZOLE 150 MG PO TABS: 150.0000 mg | ORAL_TABLET | Freq: Every day | ORAL | 1 refills | Status: DC

## 2016-05-11 NOTE — ED Triage Notes (Signed)
Patient c/o burning and frequency when urinating. She also says she has some vaginal discharge. Her lower back and belly are hurting.

## 2016-06-04 ENCOUNTER — Ambulatory Visit
Admission: RE | Admit: 2016-06-04 | Discharge: 2016-06-04 | Disposition: A | Discharge status: Home / Self Care | Payer: PPO | Source: Ambulatory Visit | Attending: Oncology | Admitting: Oncology

## 2016-06-04 DIAGNOSIS — Z9889 Other specified postprocedural states: Secondary | ICD-10-CM | POA: Diagnosis not present

## 2016-06-04 DIAGNOSIS — C50911 Malignant neoplasm of unspecified site of right female breast: Secondary | ICD-10-CM

## 2016-06-04 DIAGNOSIS — Z853 Personal history of malignant neoplasm of breast: Secondary | ICD-10-CM | POA: Insufficient documentation

## 2016-06-04 DIAGNOSIS — R928 Other abnormal and inconclusive findings on diagnostic imaging of breast: Secondary | ICD-10-CM | POA: Diagnosis not present

## 2016-06-04 DIAGNOSIS — C50912 Malignant neoplasm of unspecified site of left female breast: Principal | ICD-10-CM

## 2016-06-22 ENCOUNTER — Other Ambulatory Visit: Payer: Self-pay | Admitting: *Deleted

## 2016-06-22 MED ORDER — LETROZOLE 2.5 MG PO TABS: 2.5000 mg | ORAL_TABLET | Freq: Every day | ORAL | 1 refills | Status: DC

## 2016-06-24 NOTE — ED Provider Notes (Signed)
[facility]    CSN: [id number] Arrival date & time: 05/11/16  1528     History   Chief Complaint Chief Complaint  Patient presents with  . Abdominal Pain    HPI Veronica Ellis is a 82 y.o. female.    Abdominal Pain  Associated symptoms: dysuria and vaginal discharge (mild and slight itching)   Dysuria  Pain quality:  Burning Pain severity:  Mild Onset quality:  Sudden Duration:  2 days Timing:  Constant Progression:  Worsening Chronicity:  New Recent urinary tract infections: no   Relieved by:  Nothing Ineffective treatments:  None tried Associated symptoms: abdominal pain and vaginal discharge (mild and slight itching)   Risk factors: no hx of pyelonephritis, no hx of urolithiasis, no kidney transplant, not pregnant, no recurrent urinary tract infections, no renal cysts, no renal disease, not sexually active, no sexually transmitted infections, no single kidney and no urinary catheter     Past Medical History:  Diagnosis Date  . Anxiety   . Breast cancer ([place]) 2014   bilateral invasive mammary carcinoma. with 36 rad tx.   . Cancer ([place])    breast  . Depression   . Hyperlipemia   . Hypertension     Patient Active Problem List   Diagnosis Date Noted  . Bilateral breast cancer ([place]) 02/20/2016  . 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 Cerebrovascular disease 02/16/2015  . Anxiety 02/16/2015  . Breast lump 02/16/2015  . 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 Routine general medical examination at a health care facility 02/16/2015  . Recurrent major depressive episodes ([place]) 02/16/2015  . Below normal amount of sodium in the blood 02/16/2015  . Pre-operative cardiovascular examination 02/16/2015    Past Surgical History:  Procedure Laterality Date  . APPENDECTOMY    . BREAST BIOPSY Bilateral 2014  . BREAST SURGERY Bilateral   . COLON SURGERY     12 in. removed due  to polyps  . TUBAL LIGATION      OB History    No data available       Home Medications    Prior to Admission medications   Medication Sig Start Date End Date Taking? Authorizing Provider  aspirin EC 81 MG tablet Take by mouth.   Yes Historical Provider, MD  sertraline (ZOLOFT) 100 MG tablet Take 1 tablet (100 mg total) by mouth daily. 02/13/16  Yes Juline Patch, MD  doxycycline (VIBRA-TABS) 100 MG tablet Take 1 tablet (100 mg total) by mouth 2 (two) times daily. 04/19/16   Juline Patch, MD  fluconazole (DIFLUCAN) 150 MG tablet Take 1 tablet (150 mg total) by mouth daily. 05/11/16   Norval Gable, MD  isosorbide mononitrate (IMDUR) 30 MG 24 hr tablet Take 1 tablet by mouth daily. 01/20/16   Historical Provider, MD  letrozole (FEMARA) 2.5 MG tablet Take 1 tablet (2.5 mg total) by mouth daily. 06/22/16   Lloyd Huger, MD  ondansetron (ZOFRAN ODT) 4 MG disintegrating tablet Take 1 tablet (4 mg total) by mouth every 6 (six) hours as needed for nausea or vomiting. 04/22/16   Delman Kitten, MD    Family History Family History  Problem Relation Age of Onset  . Breast cancer Daughter 38  . Breast cancer Daughter 12    Social History Social History  Substance Use Topics  . Smoking status: Former Smoker    Types: Cigarettes  . Smokeless tobacco: Never Used  . Alcohol  use 0.0 oz/week     Comment: Occ     Allergies   Erythromycin; Iodine; Penicillins; and Shellfish allergy   Review of Systems Review of Systems  Gastrointestinal: Positive for abdominal pain.  Genitourinary: Positive for dysuria and vaginal discharge (mild and slight itching).     Physical Exam Triage Vital Signs ED Triage Vitals  Enc Vitals Group     BP 05/11/16 1727 (!) 168/76     Pulse Rate 05/11/16 1727 73     Resp 05/11/16 1727 18     Temp 05/11/16 1727 98 F (36.7 C)     Temp Source 05/11/16 1727 Oral     SpO2 05/11/16 1727 100 %     Weight 05/11/16 1723 162 lb (73.5 kg)     Height 05/11/16  1723 5\' 7"  (1.702 m)     Head Circumference --      Peak Flow --      Pain Score 05/11/16 1727 4     Pain Loc --      Pain Edu? --      Excl. in [place]? --    No data found.   Updated Vital Signs BP (!) 168/76 (BP Location: Right Arm)   Pulse 73   Temp 98 F (36.7 C) (Oral)   Resp 18   Ht 5\' 7"  (1.702 m)   Wt 162 lb (73.5 kg)   SpO2 100%   BMI 25.37 kg/m   Visual Acuity Right Eye Distance:   Left Eye Distance:   Bilateral Distance:    Right Eye Near:   Left Eye Near:    Bilateral Near:     Physical Exam  Constitutional: She appears well-developed and well-nourished. No distress.  Abdominal: Soft. Bowel sounds are normal. She exhibits no distension and no mass. There is no tenderness. There is no rebound and no guarding.  Skin: She is not diaphoretic.  Nursing note and vitals reviewed.    UC Treatments / Results  Labs (all labs ordered are listed, but only abnormal results are displayed) Labs Reviewed  URINALYSIS COMPLETEWITH MICROSCOPIC ([place]) - Abnormal; Notable for the following:       Result Value   Squamous Epithelial / LPF 0-5 (*)    All other components within normal limits    EKG  EKG Interpretation None       Radiology No results found.  Procedures Procedures (including critical care time)  Medications Ordered in UC Medications - No data to display   Initial Impression / Assessment and Plan / UC Course  I have reviewed the triage vital signs and the nursing notes.  Pertinent labs & imaging results that were available during my care of the patient were reviewed by me and considered in my medical decision making (see chart for details).  Clinical Course      Final Clinical Impressions(s) / UC Diagnoses   Final diagnoses:  Yeast vaginitis    New Prescriptions Discharge Medication List as of 05/11/2016  5:58 PM    START taking these medications   Details  fluconazole (DIFLUCAN) 150 MG tablet Take 1 tablet (150 mg total) by  mouth daily., Starting Fri 05/11/2016, Normal       1. Lab results and diagnosis reviewed with patient 2. rx as per orders above; reviewed possible side effects, interactions, risks and benefits  3. Recommend supportive treatment with increase water intake 4. Follow-up prn if symptoms worsen or don't improve   Norval Gable, MD 06/24/16 1843

## 2016-07-03 DIAGNOSIS — H2511 Age-related nuclear cataract, right eye: Secondary | ICD-10-CM | POA: Diagnosis not present

## 2016-07-04 IMAGING — PT NM PET TUM IMG RESTAG (PS) SKULL BASE T - THIGH
9 series · 25 of 25 positions shown · non-contrast
Comparison: Chest CT on 10/13/2014 and 01/14/2014

CLINICAL DATA: Subsequent treatment strategy for breast carcinoma.
Indeterminate pulmonary opacities on recent chest CT.

EXAM:
NUCLEAR MEDICINE PET SKULL BASE TO THIGH
TECHNIQUE: 12.0 mCi F-18 FDG was injected intravenously. Full-ring PET imaging
was performed from the skull base to thigh after the radiotracer. CT
data was obtained and used for attenuation correction and anatomic
localization.
FASTING BLOOD GLUCOSE:  Value: 91 mg/dl

[Series 3: pet wb (ac) · axial · 5.0mm · 4.07mm/px · z∈[-1549,-565]mm · 5 of 329 slices shown]
[im 1/329]
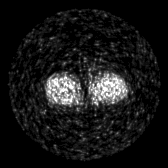
[im 83/329]
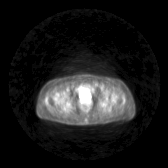
[im 165/329]
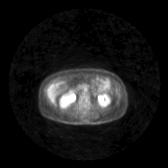
[im 247/329]
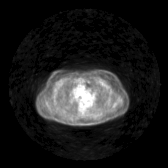
[im 329/329]
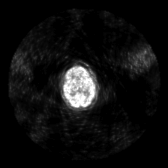

[Series 4: ct wb 5.0 b30f · axial · 5.0mm · 0.98mm/px · z∈[-1549,-565]mm · 4 of 329 slices shown]
[im 1/329]
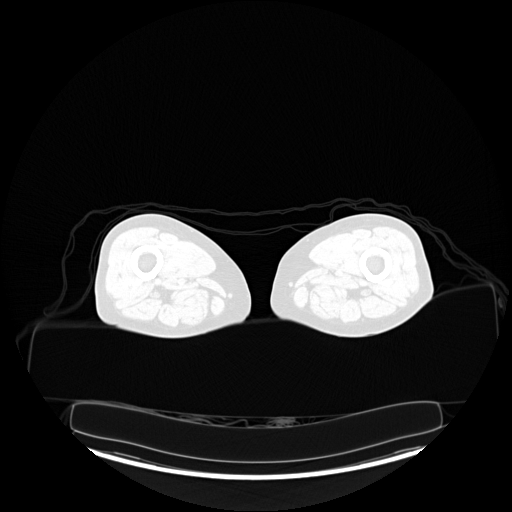
[im 110/329]
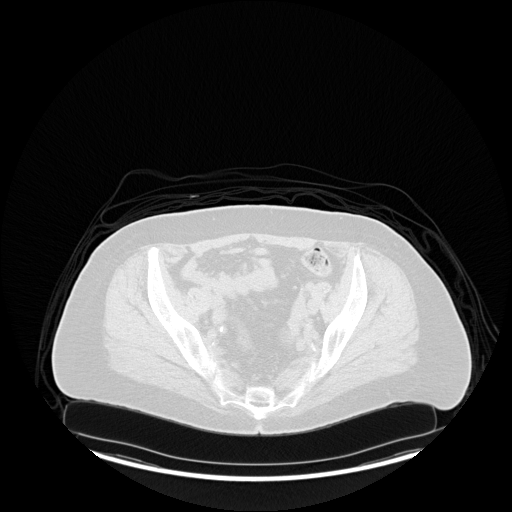
[im 219/329]
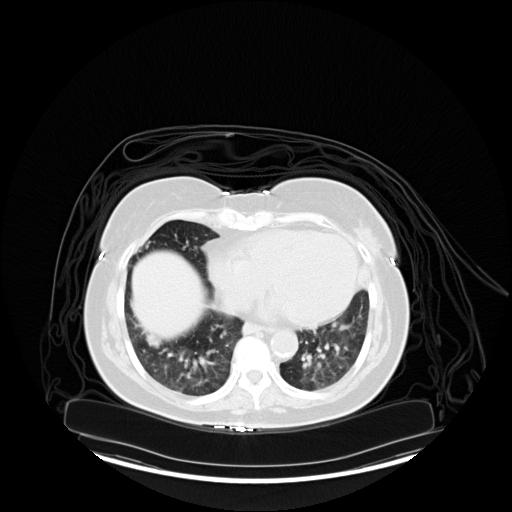
[im 329/329  brain]
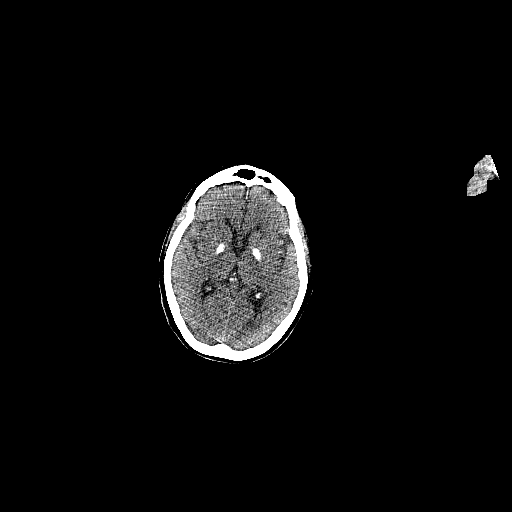

[Series 5: pet wb uncorrected (nac) · axial · 5.0mm · 4.07mm/px · z∈[-1549,-565]mm · 4 of 329 slices shown]
[im 1/329]
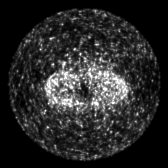
[im 110/329]
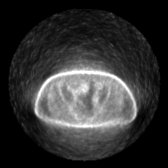
[im 219/329]
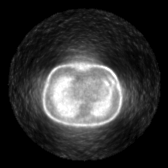
[im 329/329]
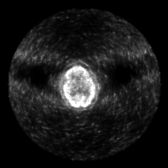

[Series 603: pet axial · 4 of 328 slices shown]
[im 1/328]
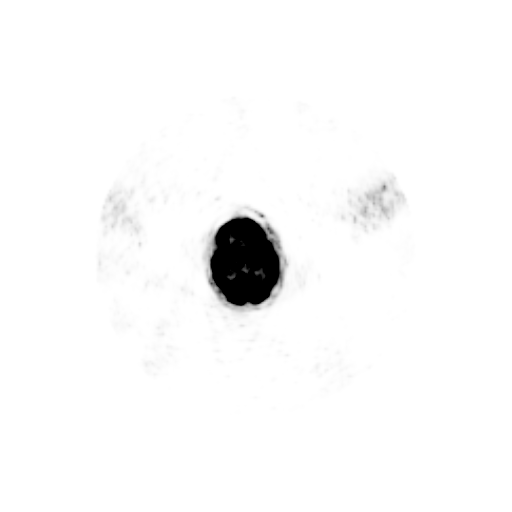
[im 110/328]
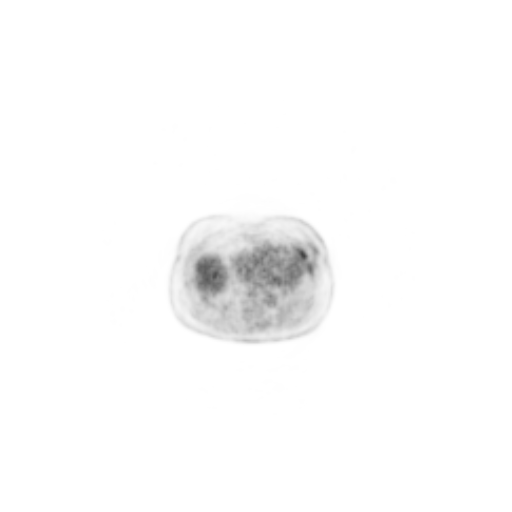
[im 219/328]
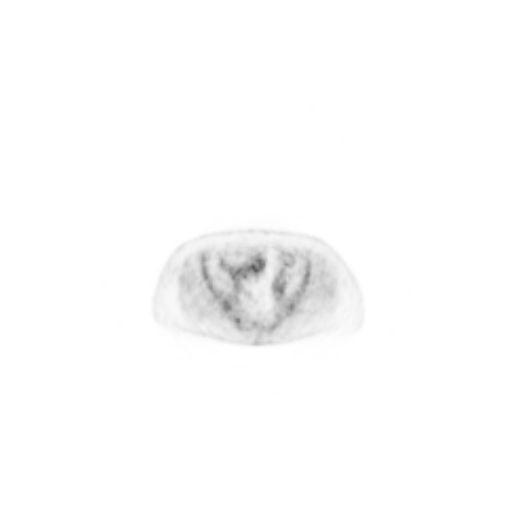
[im 328/328]
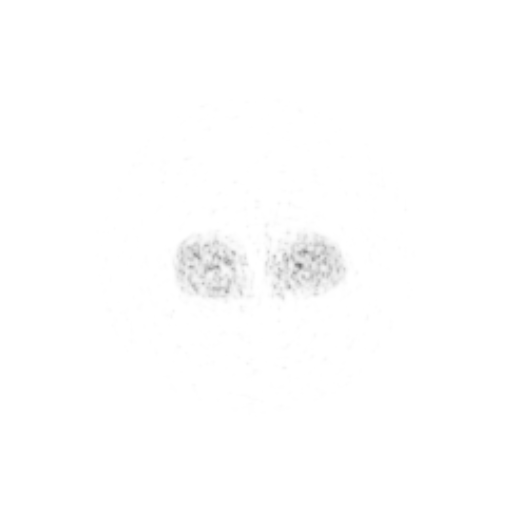

[Series 604: pet coronal · 1 of 92 slices shown]
[im 1/92]
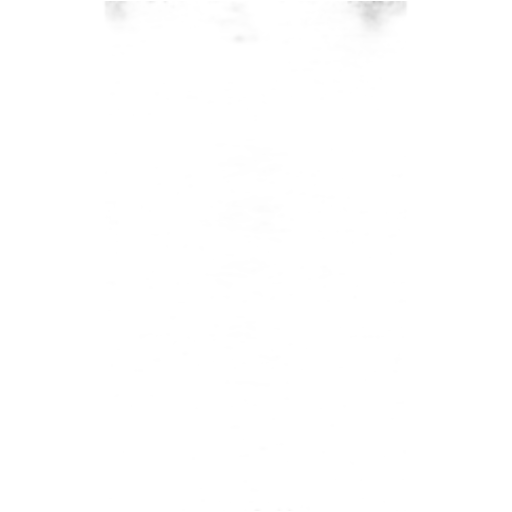

[Series 605: pet sagittal · 1 of 119 slices shown]
[im 1/119]
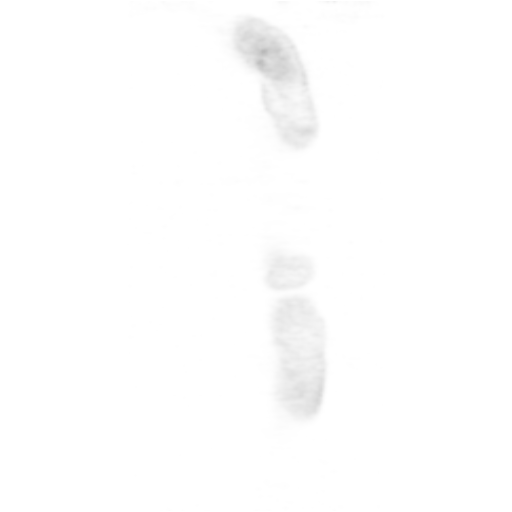

[Series 606: pet/ct axial · 4 of 326 slices shown]
[im 1/326]
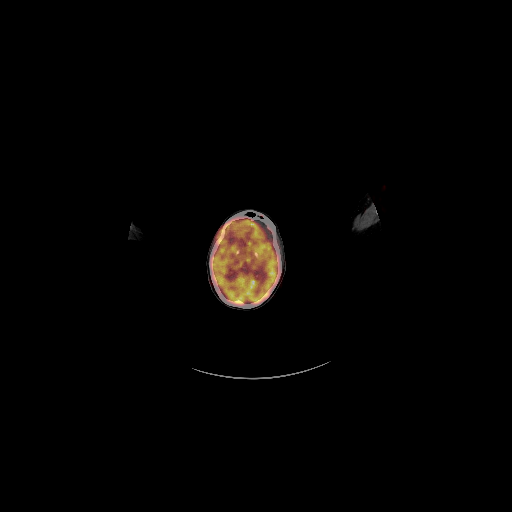
[im 109/326]
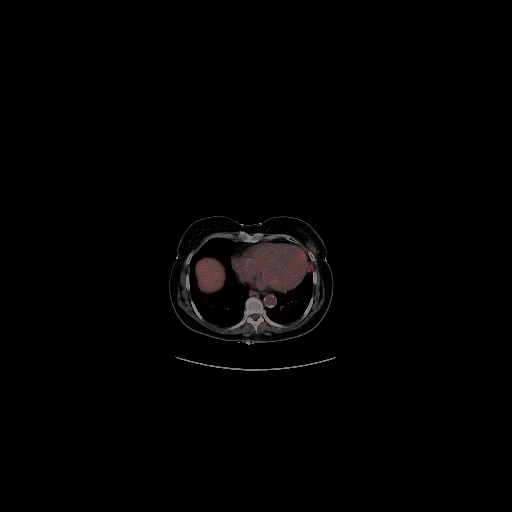
[im 217/326]
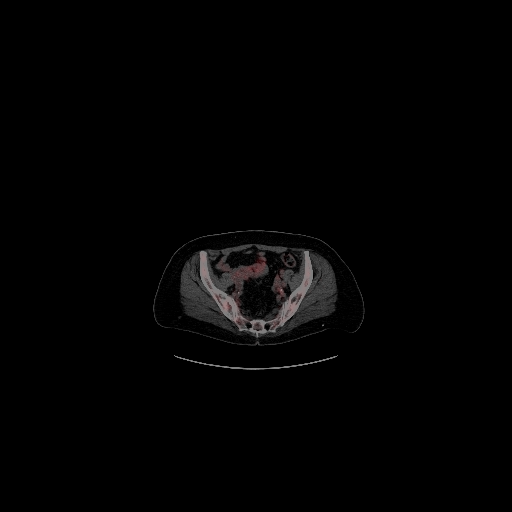
[im 326/326]
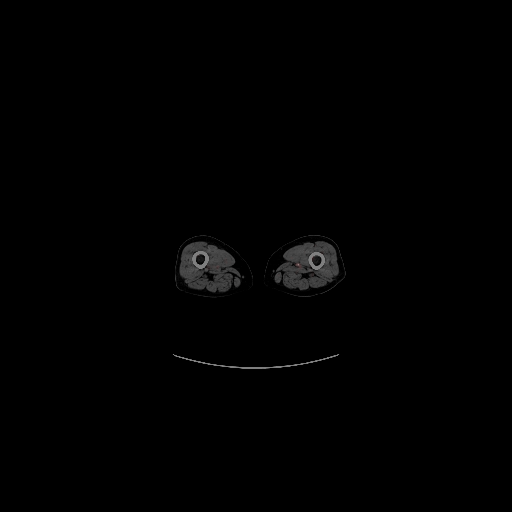

[Series 607: pet/ct coronal · 1 of 82 slices shown]
[im 1/82]
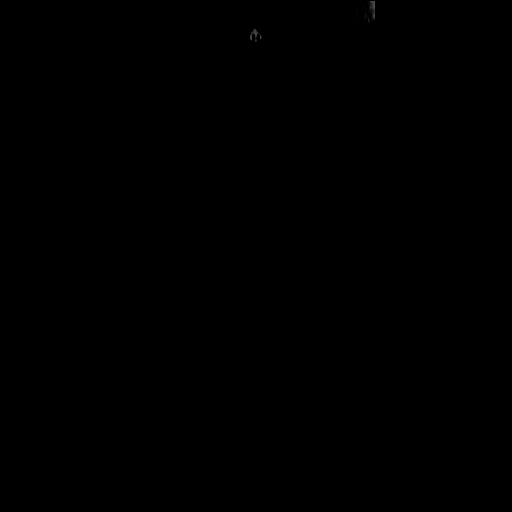

[Series 608: pet/ct sagittal · 1 of 116 slices shown]
[im 1/116]
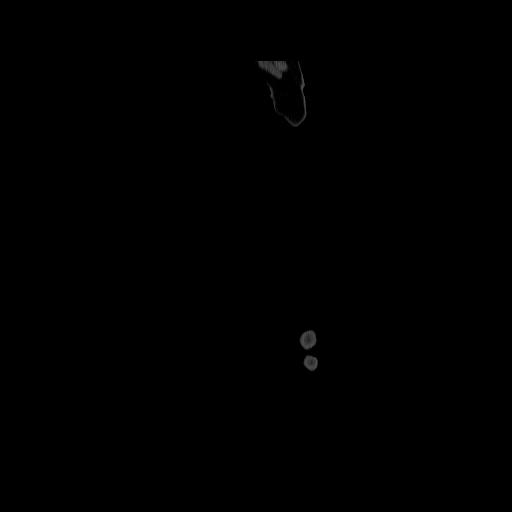

[25 of 25 positions shown; findings below may reference images not displayed]

FINDINGS: NECK

No hypermetabolic lymph nodes in the neck.

CHEST

Airspace disease in the left lung apex and other small ill-defined
nodular opacities in the left lower lobe show hypermetabolic
activity. Although nonspecific by PET, given the CT appearance,
these opacities favor infectious or inflammatory process over
neoplasm.

Mild hypermetabolic activity is also seen within mildly enlarged
mediastinal lymph nodes in the right paratracheal region and AP
window which are stable compared to earlier CT on 01/14/2014. These
are also nonspecific by PET, and may be reactive although neoplastic
etiology cannot definitely be excluded.

ABDOMEN/PELVIS

No abnormal hypermetabolic activity within the liver, pancreas,
adrenal glands, or spleen. No hypermetabolic lymph nodes in the
abdomen or pelvis.

Calcified gallstone incidentally noted, without evidence of
cholecystitis by CT. A simple appearing cystic lesion is seen in the
left adnexa measuring 5.4 cm. This has no associated metabolic
activity, indicating a benign etiology. Benign or low-grade cystic
ovarian neoplasm cannot be excluded.

SKELETON

No focal hypermetabolic activity to suggest skeletal metastasis.
IMPRESSION: Hypermetabolic left upper lobe airspace disease and small
ill-defined nodular opacities in the left lower lobe. These are
nonspecific, but favor infectious or inflammatory process over
neoplasm. Continued followup by chest CT recommended in 2-3 months.

Mild hypermetabolic mediastinal lymphadenopathy, which remains
stable in size compared to earlier CT on 01/14/2014. This is also
nonspecific, and may be reactive rather than neoplastic. Continued
followup by chest CT is recommended.

5.4 cm benign appearing left adnexal cyst, which shows no associated
metabolic activity. In a postmenopausal female, pelvic ultrasound is
recommended for further evaluation. This recommendation follows ACR
consensus guidelines: White Paper of the ACR Incidental Findings
Committee II on Adnexal Findings. [HOSPITAL] [DATE].

Cholelithiasis, without radiographic evidence of cholecystitis.

## 2016-08-13 ENCOUNTER — Ambulatory Visit: Admit: 2016-08-13 | Payer: PPO | Admitting: Ophthalmology

## 2016-08-13 SURGERY — PHACOEMULSIFICATION, CATARACT, WITH IOL INSERTION
Anesthesia: Topical | Laterality: Right

## 2016-08-15 NOTE — Progress Notes (Deleted)
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  INTERVAL HISTORY: Patient last evaluated in clinic in August 2016. She has missed multiple appointments in the interim. Patient is concerned about some right breast tenderness from a fall this past March. She continues to be highly anxious, but otherwise feels well. She denies any recent fevers or illnesses. She denies any weight loss. She denies any chest pain or shortness of breath.  She denies any nausea, vomiting, constipation, or diarrhea.  She has no melena or hematochezia.  She has no urinary complaints.  Patient offers no further specific complaints today.   REVIEW OF SYSTEMS:   Review of Systems  Constitutional: Negative.  Negative for fever, malaise/fatigue and weight loss.  Respiratory: Negative.  Negative for shortness of breath.   Cardiovascular: Negative.  Negative for chest pain.  Gastrointestinal: Negative.   Genitourinary: Negative.   Musculoskeletal: Negative.   Neurological: Positive for sensory change. Negative for weakness.  Psychiatric/Behavioral: Positive for memory loss. The patient is nervous/anxious.     As per HPI. Otherwise, a complete review of systems is negatve.  PAST MEDICAL HISTORY: Past Medical History:  Diagnosis Date  . Anxiety   . Breast cancer ([place]) 2014   bilateral invasive mammary carcinoma. with 36 rad tx.   . Cancer ([place])    breast  . Depression   . Hyperlipemia   . Hypertension     PAST SURGICAL HISTORY: Past Surgical History:  Procedure Laterality Date  . APPENDECTOMY    . BREAST BIOPSY Bilateral 2014  . BREAST SURGERY Bilateral   .  COLON SURGERY     12 in. removed due to polyps  . TUBAL LIGATION      FAMILY HISTORY Family History  Problem Relation Age of Onset  . Breast cancer Daughter 69  . Breast cancer Daughter 25       ADVANCED DIRECTIVES:    HEALTH MAINTENANCE: Social History  Substance Use Topics  . Smoking status: Former Smoker    Types: Cigarettes  . Smokeless tobacco: Never Used  . Alcohol use 0.0 oz/week     Comment: Occ     Colonoscopy:  PAP:  Bone density:  Lipid panel:  Allergies  Allergen Reactions  . Erythromycin   . Iodine   . Penicillins   . Shellfish Allergy     Current Outpatient Prescriptions  Medication Sig Dispense Refill  . aspirin EC 81 MG tablet Take by mouth.    . doxycycline (VIBRA-TABS) 100 MG tablet Take 1 tablet (100 mg total) by mouth 2 (two) times daily. 20 tablet 0  . fluconazole (DIFLUCAN) 150 MG tablet Take 1 tablet (150 mg total) by mouth daily. 1 tablet 1  . isosorbide mononitrate (IMDUR) 30 MG 24 hr tablet Take 1 tablet by mouth daily.    Marland Kitchen letrozole (FEMARA) 2.5 MG tablet Take 1 tablet (2.5 mg total) by mouth daily. 90 tablet 1  . ondansetron (ZOFRAN ODT) 4 MG disintegrating tablet Take 1 tablet (4 mg total) by mouth every 6 (six) hours as needed for nausea or vomiting. 20 tablet 0  . sertraline (ZOLOFT) 100 MG tablet Take 1 tablet (100 mg total) by mouth daily. 30 tablet 5  Current Facility-Administered Medications  Medication Dose Route Frequency Provider Last Rate Last Dose  . albuterol (PROVENTIL) (2.5 MG/3ML) 0.083% nebulizer solution 2.5 mg  2.5 mg Nebulization Once Juline Patch, MD        OBJECTIVE: There were no vitals filed for this visit.   There is no height or weight on file to calculate BMI.    ECOG FS:0 - Asymptomatic  General: Well-developed, well-nourished, no acute distress. Eyes: Pink conjunctiva, anicteric sclera. Breasts: Bilateral breasts and axilla without lumps or masses. Heart: Regular rate and rhythm. No rubs, murmurs,  or gallops. Lungs: Clear to auscultation bilaterally. Abdomen: Soft, nontender, nondistended. No organomegaly noted, normoactive bowel sounds. Musculoskeletal: No edema, cyanosis, or clubbing. Neuro: Alert, answering all questions appropriately. Cranial nerves grossly intact. Skin: No rashes or petechiae noted. Psych: Normal affect.   LAB RESULTS:  Lab Results  Component Value Date   NA 138 04/22/2016   K 3.8 04/22/2016   CL 106 04/22/2016   CO2 23 04/22/2016   GLUCOSE 109 (H) 04/22/2016   BUN 15 04/22/2016   CREATININE 0.74 04/22/2016   CALCIUM 9.2 04/22/2016   PROT 7.3 04/22/2016   ALBUMIN 3.9 04/22/2016   AST 17 04/22/2016   ALT 13 (L) 04/22/2016   ALKPHOS 78 04/22/2016   BILITOT 0.8 04/22/2016   GFRNONAA >60 04/22/2016   GFRAA >60 04/22/2016    Lab Results  Component Value Date   WBC 6.4 04/22/2016   NEUTROABS 5.3 10/15/2014   HGB 12.1 04/22/2016   HCT 37.8 04/22/2016   MCV 74.7 (L) 04/22/2016   PLT 343 04/22/2016     STUDIES: No results found.  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  PLAN:    1.  Bilateral breast cancer:  No evidence of disease.  Previously after lengthy discussion with patient and her family about her intermediate Oncotype score, she declined adjuvant chemotherapy.  Continue letrozole completing in January 2020.  Patient's most recent mammogram on May 31, 2015 was reported as BI-RADS 2, repeat in September 2017.  Return to clinic in 6 months for routine evaluation.   2.  Chest CT abnormalities: No evidence of malignancy. No further intervention or imaging is necessary. 3. Falls/neuropathy: Treatment per neurology. 4. Osteopenia: Patient's most recent bone mineral density in September 2016 was reported as -2.0 which is unchanged from 2 years prior when her T score was -1.9.  Approximately 30 minutes was  spent in discussion of which greater than 50% was consultation.  Patient expressed understanding and was in agreement with this plan. She also understands that She can call clinic at any time with any questions, concerns, or complaints.    Lloyd Huger, MD   08/15/2016 11:02 PM

## 2016-08-17 ENCOUNTER — Ambulatory Visit: Payer: PPO | Admitting: Oncology

## 2016-09-14 ENCOUNTER — Encounter: Payer: Self-pay | Admitting: Family Medicine

## 2016-09-14 ENCOUNTER — Ambulatory Visit
Admission: RE | Admit: 2016-09-14 | Discharge: 2016-09-14 | Disposition: A | Discharge status: Home / Self Care | Payer: PPO | Source: Ambulatory Visit | Attending: Family Medicine | Admitting: Family Medicine

## 2016-09-14 ENCOUNTER — Ambulatory Visit (INDEPENDENT_AMBULATORY_CARE_PROVIDER_SITE_OTHER): Payer: PPO | Admitting: Family Medicine

## 2016-09-14 VITALS — BP 130/60 | HR 84 | Ht 67.0 in | Wt 151.0 lb

## 2016-09-14 DIAGNOSIS — X58XXXA Exposure to other specified factors, initial encounter: Secondary | ICD-10-CM | POA: Insufficient documentation

## 2016-09-14 DIAGNOSIS — M545 Low back pain: Secondary | ICD-10-CM | POA: Diagnosis not present

## 2016-09-14 DIAGNOSIS — M1288 Other specific arthropathies, not elsewhere classified, other specified site: Secondary | ICD-10-CM | POA: Insufficient documentation

## 2016-09-14 DIAGNOSIS — M47812 Spondylosis without myelopathy or radiculopathy, cervical region: Secondary | ICD-10-CM | POA: Diagnosis not present

## 2016-09-14 DIAGNOSIS — S39012A Strain of muscle, fascia and tendon of lower back, initial encounter: Secondary | ICD-10-CM | POA: Diagnosis not present

## 2016-09-14 DIAGNOSIS — K8020 Calculus of gallbladder without cholecystitis without obstruction: Secondary | ICD-10-CM | POA: Insufficient documentation

## 2016-09-14 DIAGNOSIS — F331 Major depressive disorder, recurrent, moderate: Secondary | ICD-10-CM

## 2016-09-14 DIAGNOSIS — S161XXA Strain of muscle, fascia and tendon at neck level, initial encounter: Secondary | ICD-10-CM | POA: Diagnosis not present

## 2016-09-14 DIAGNOSIS — M5126 Other intervertebral disc displacement, lumbar region: Secondary | ICD-10-CM | POA: Insufficient documentation

## 2016-09-14 DIAGNOSIS — M5030 Other cervical disc degeneration, unspecified cervical region: Secondary | ICD-10-CM | POA: Insufficient documentation

## 2016-09-14 DIAGNOSIS — M542 Cervicalgia: Secondary | ICD-10-CM | POA: Diagnosis not present

## 2016-09-14 DIAGNOSIS — I700 Atherosclerosis of aorta: Secondary | ICD-10-CM | POA: Diagnosis not present

## 2016-09-14 DIAGNOSIS — S199XXA Unspecified injury of neck, initial encounter: Secondary | ICD-10-CM | POA: Diagnosis not present

## 2016-09-14 DIAGNOSIS — S3992XA Unspecified injury of lower back, initial encounter: Secondary | ICD-10-CM | POA: Diagnosis not present

## 2016-09-14 MED ORDER — SERTRALINE HCL 50 MG PO TABS: 50.0000 mg | ORAL_TABLET | Freq: Every day | ORAL | 3 refills | Status: DC

## 2016-09-14 NOTE — Progress Notes (Signed)
Name: Veronica Ellis   MRN: [id number]    DOB: [birth year]   Date:09/14/2016       Progress Note  Subjective  Chief Complaint  Chief Complaint  Patient presents with  . Headache    fell down wooden steps backwards. Did not hit head as far as she knows. The fall happened on Monday and has a pain that runs from lower back upwards. Head started hurting today on top of head radiating to frontal lobe    Headache   This is a new problem. The current episode started today. The problem occurs constantly. The problem has been waxing and waning. The pain is located in the vertex and occipital region. The pain does not radiate. The pain quality is not similar to prior headaches. The quality of the pain is described as aching. The pain is mild. Associated symptoms include dizziness, a loss of balance, nausea, neck pain, tingling and a visual change. Pertinent negatives include no abdominal pain, back pain, blurred vision, coughing, ear pain, fever, insomnia, numbness, sore throat, weakness or weight loss. Nothing aggravates the symptoms. She has tried NSAIDs for the symptoms. The treatment provided mild relief. There is no history of recent head traumas. (Fall on Monday)  Neck Injury   This is a new problem. The current episode started in the past 7 days. The problem occurs intermittently. The problem has been waxing and waning. The pain is associated with a fall. The pain is present in the occipital region and midline. The quality of the pain is described as aching. The pain is moderate. Associated symptoms include headaches, tingling and a visual change. Pertinent negatives include no chest pain, fever, numbness, paresis, weakness or weight loss. She has tried NSAIDs for the symptoms. The treatment provided mild relief.  Back Pain  The current episode started in the past 7 days. The problem occurs intermittently. The problem has been gradually worsening since onset. The pain is moderate. Associated symptoms  include headaches, paresthesias and tingling. Pertinent negatives include no abdominal pain, bladder incontinence, bowel incontinence, chest pain, dysuria, fever, numbness, paresis, weakness or weight loss.  Depression       The patient presents with depression.  This is a chronic problem.  The current episode started more than 1 year ago.   The onset quality is gradual.   Associated symptoms include headaches.  Associated symptoms include does not have insomnia, no myalgias and no suicidal ideas.     The symptoms are aggravated by medication withdrawal.  Past treatments include SSRIs - Selective serotonin reuptake inhibitors.  Risk factors include dementia and menopause.   Past medical history includes depression.     Pertinent negatives include no head trauma.  (fall on Monday)   No problem-specific Assessment & Plan notes found for this encounter.   Past Medical History:  Diagnosis Date  . Anxiety   . Breast cancer ([place]) 2014   bilateral invasive mammary carcinoma. with 36 rad tx.   . Cancer ([place])    breast  . Depression   . Hyperlipemia   . Hypertension     Past Surgical History:  Procedure Laterality Date  . APPENDECTOMY    . BREAST BIOPSY Bilateral 2014  . BREAST SURGERY Bilateral   . COLON SURGERY     12 in. removed due to polyps  . TUBAL LIGATION      Family History  Problem Relation Age of Onset  . Breast cancer Daughter 77  . Breast cancer Daughter 57  Social History   Social History  . Marital status: Widowed    Spouse name: N/A  . Number of children: N/A  . Years of education: N/A   Occupational History  . Not on file.   Social History Main Topics  . Smoking status: Former Smoker    Types: Cigarettes  . Smokeless tobacco: Never Used  . Alcohol use 0.0 oz/week     Comment: Occ  . Drug use: No  . Sexual activity: Yes   Other Topics Concern  . Not on file   Social History Narrative  . No narrative on file    Allergies  Allergen Reactions  .  Erythromycin   . Iodine   . Penicillins   . Shellfish Allergy      Review of Systems  Constitutional: Negative for chills, fever, malaise/fatigue and weight loss.  HENT: Negative for ear discharge, ear pain and sore throat.   Eyes: Negative for blurred vision.  Respiratory: Negative for cough, sputum production, shortness of breath and wheezing.   Cardiovascular: Negative for chest pain, palpitations and leg swelling.  Gastrointestinal: Positive for nausea. Negative for abdominal pain, blood in stool, bowel incontinence, constipation, diarrhea, heartburn and melena.  Genitourinary: Negative for bladder incontinence, dysuria, frequency, hematuria and urgency.  Musculoskeletal: Positive for neck pain. Negative for back pain, joint pain and myalgias.  Skin: Negative for rash.  Neurological: Positive for dizziness, tingling, headaches, paresthesias and loss of balance. Negative for sensory change, focal weakness, weakness and numbness.  Endo/Heme/Allergies: Negative for environmental allergies and polydipsia. Does not bruise/bleed easily.  Psychiatric/Behavioral: Negative for depression and suicidal ideas. The patient is not nervous/anxious and does not have insomnia.      Objective  Vitals:   09/14/16 1336  BP: 130/60  Pulse: 84  Weight: 151 lb (68.5 kg)  Height: 5\' 7"  (1.702 m)    Physical Exam  Constitutional: She is well-developed, well-nourished, and in no distress. No distress.  HENT:  Head: Normocephalic and atraumatic.  Right Ear: External ear normal.  Left Ear: External ear normal.  Nose: Nose normal.  Mouth/Throat: Oropharynx is clear and moist.  Eyes: Conjunctivae and EOM are normal. Pupils are equal, round, and reactive to light. Right eye exhibits no discharge. Left eye exhibits no discharge.  Neck: Normal range of motion. Neck supple. No JVD present. No thyromegaly present.  Cardiovascular: Normal rate, regular rhythm, normal heart sounds and intact distal pulses.   Exam reveals no gallop and no friction rub.   No murmur heard. Pulmonary/Chest: Effort normal and breath sounds normal.  Abdominal: Soft. Bowel sounds are normal. She exhibits no mass. There is no tenderness. There is no guarding.  Musculoskeletal: Normal range of motion. She exhibits no edema.       Cervical back: She exhibits spasm. She exhibits normal range of motion, no tenderness, no bony tenderness and no swelling.       Lumbar back: She exhibits tenderness, bony tenderness and spasm. She exhibits normal range of motion and no deformity.       Back:  Lymphadenopathy:    She has no cervical adenopathy.  Neurological: She is alert. She has normal sensation, normal strength, normal reflexes and intact cranial nerves. No cranial nerve deficit.  Skin: Skin is warm and dry. She is not diaphoretic.  Psychiatric: Mood and affect normal.  Nursing note and vitals reviewed.     Assessment & Plan  Problem List Items Addressed This Visit    None    Visit Diagnoses  Cervical strain, acute, initial encounter    -  Primary   Relevant Orders   DG Cervical Spine Complete   Lumbosacral strain, initial encounter       Relevant Orders   DG Lumbar Spine Complete   Moderate episode of recurrent major depressive disorder (HCC)       Relevant Medications   sertraline (ZOLOFT) 50 MG tablet        Dr. [facility]  09/14/16

## 2016-09-18 ENCOUNTER — Telehealth: Payer: Self-pay

## 2016-09-18 ENCOUNTER — Other Ambulatory Visit: Payer: Self-pay

## 2016-09-18 NOTE — Telephone Encounter (Signed)
No message needed °

## 2016-09-19 NOTE — Progress Notes (Deleted)
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  INTERVAL HISTORY: Patient last evaluated in clinic in August 2016. She has missed multiple appointments in the interim. Patient is concerned about some right breast tenderness from a fall this past March. She continues to be highly anxious, but otherwise feels well. She denies any recent fevers or illnesses. She denies any weight loss. She denies any chest pain or shortness of breath.  She denies any nausea, vomiting, constipation, or diarrhea.  She has no melena or hematochezia.  She has no urinary complaints.  Patient offers no further specific complaints today.   REVIEW OF SYSTEMS:   Review of Systems  Constitutional: Negative.  Negative for fever, malaise/fatigue and weight loss.  Respiratory: Negative.  Negative for shortness of breath.   Cardiovascular: Negative.  Negative for chest pain.  Gastrointestinal: Negative.   Genitourinary: Negative.   Musculoskeletal: Negative.   Neurological: Positive for sensory change. Negative for weakness.  Psychiatric/Behavioral: Positive for memory loss. The patient is nervous/anxious.     As per HPI. Otherwise, a complete review of systems is negatve.  PAST MEDICAL HISTORY: Past Medical History:  Diagnosis Date  . Anxiety   . Breast cancer ([place]) 2014   bilateral invasive mammary carcinoma. with 36 rad tx.   . Cancer ([place])    breast  . Depression   . Hyperlipemia   . Hypertension     PAST SURGICAL HISTORY: Past Surgical History:  Procedure Laterality Date  . APPENDECTOMY    . BREAST BIOPSY Bilateral 2014  . BREAST SURGERY Bilateral   .  COLON SURGERY     12 in. removed due to polyps  . TUBAL LIGATION      FAMILY HISTORY Family History  Problem Relation Age of Onset  . Breast cancer Daughter 54  . Breast cancer Daughter 55       ADVANCED DIRECTIVES:    HEALTH MAINTENANCE: Social History  Substance Use Topics  . Smoking status: Former Smoker    Types: Cigarettes  . Smokeless tobacco: Never Used  . Alcohol use 0.0 oz/week     Comment: Occ     Colonoscopy:  PAP:  Bone density:  Lipid panel:  Allergies  Allergen Reactions  . Erythromycin   . Iodine   . Penicillins   . Shellfish Allergy     Current Outpatient Prescriptions  Medication Sig Dispense Refill  . aspirin EC 81 MG tablet Take by mouth.    . isosorbide mononitrate (IMDUR) 30 MG 24 hr tablet Take 1 tablet by mouth daily.    Marland Kitchen letrozole (FEMARA) 2.5 MG tablet Take 1 tablet (2.5 mg total) by mouth daily. 90 tablet 1  . ondansetron (ZOFRAN ODT) 4 MG disintegrating tablet Take 1 tablet (4 mg total) by mouth every 6 (six) hours as needed for nausea or vomiting. (Patient not taking: Reported on 09/14/2016) 20 tablet 0  . sertraline (ZOLOFT) 50 MG tablet Take 1 tablet (50 mg total) by mouth daily. 30 tablet 3   Current Facility-Administered Medications  Medication Dose Route Frequency Provider Last Rate Last Dose  . albuterol (PROVENTIL) (2.5 MG/3ML) 0.083% nebulizer solution 2.5 mg  2.5 mg Nebulization Once Juline Patch, MD  OBJECTIVE: There were no vitals filed for this visit.   There is no height or weight on file to calculate BMI.    ECOG FS:0 - Asymptomatic  General: Well-developed, well-nourished, no acute distress. Eyes: Pink conjunctiva, anicteric sclera. Breasts: Bilateral breasts and axilla without lumps or masses. Heart: Regular rate and rhythm. No rubs, murmurs, or gallops. Lungs: Clear to auscultation bilaterally. Abdomen: Soft, nontender, nondistended. No organomegaly noted, normoactive bowel sounds. Musculoskeletal: No  edema, cyanosis, or clubbing. Neuro: Alert, answering all questions appropriately. Cranial nerves grossly intact. Skin: No rashes or petechiae noted. Psych: Normal affect.   LAB RESULTS:  Lab Results  Component Value Date   NA 138 04/22/2016   K 3.8 04/22/2016   CL 106 04/22/2016   CO2 23 04/22/2016   GLUCOSE 109 (H) 04/22/2016   BUN 15 04/22/2016   CREATININE 0.74 04/22/2016   CALCIUM 9.2 04/22/2016   PROT 7.3 04/22/2016   ALBUMIN 3.9 04/22/2016   AST 17 04/22/2016   ALT 13 (L) 04/22/2016   ALKPHOS 78 04/22/2016   BILITOT 0.8 04/22/2016   GFRNONAA >60 04/22/2016   GFRAA >60 04/22/2016    Lab Results  Component Value Date   WBC 6.4 04/22/2016   NEUTROABS 5.3 10/15/2014   HGB 12.1 04/22/2016   HCT 37.8 04/22/2016   MCV 74.7 (L) 04/22/2016   PLT 343 04/22/2016     STUDIES: Dg Cervical Spine Complete  Result Date: 09/14/2016 CLINICAL DATA:  Right-sided cervical pain after fall. EXAM: CERVICAL SPINE - COMPLETE 4+ VIEW COMPARISON:  CT from 03/16/2010 and and new FINDINGS: The atlantodental interval is within normal limits. No prevertebral soft tissue swelling is seen. There is disc space narrowing at C4-5, C5-6 and C6-7 with associated mild bilateral facet arthropathy. Mild neural foraminal encroachment from uncovertebral spurring at C4-5, C5-6 and C6-7 on the right and C3-4 through C6-7 on the left. Bilateral uncovertebral joint osteoarthritic spurring from C3-4 through C7-T1 bilaterally. No acute fracture. Visualized lungs are unremarkable as are the ribs. IMPRESSION: No acute fracture. Cervical spondylosis with degenerative disc space narrowing from C4 through C7 with associated neural foraminal encroachment from facet arthropathy and uncovertebral spurring. Electronically Signed   By: David  Kwon M.D.   On: 09/14/2016 15:16   Dg Lumbar Spine Complete  Result Date: 09/14/2016 CLINICAL DATA:  Right-sided lower back pain after fall EXAM: LUMBAR SPINE - COMPLETE 4+ VIEW  COMPARISON:  None. FINDINGS: Normal lumbar segmentation without acute fracture nor bone destruction. Mild disc space narrowing at L3-4 and L4-5 with partially calcified posterior disc bulges or small protrusions at L2-3, L3-4 and L4-5. Bones are slightly demineralized in appearance. There is facet joint space narrowing and sclerosis from L2 through S1 most severely affecting L4-5 and L5-S1. No spondylolysis nor spondylolisthesis. Ancillary findings include with right upper quadrant lateral weighted gallstone, right lower quadrant chain sutures and abdominal aortic atherosclerosis. IMPRESSION: No acute osseous abnormality. Lower lumbar facet arthropathy. Partially calcified disc bulges/small protrusions from L2 through L5. Ancillary findings include with right upper quadrant lateral weighted gallstone, right lower quadrant chain sutures and abdominal aortic atherosclerosis. Electronically Signed   By: David  Kwon M.D.   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  PLAN:    1.  Bilateral breast cancer:  No evidence of disease.  Previously after lengthy discussion with patient and her family about her intermediate Oncotype   score, she declined adjuvant chemotherapy.  Continue letrozole completing in January 2020.  Patient's most recent mammogram on May 31, 2015 was reported as BI-RADS 2, repeat in September 2017.  Return to clinic in 6 months for routine evaluation.   2.  Chest CT abnormalities: No evidence of malignancy. No further intervention or imaging is necessary. 3. Falls/neuropathy: Treatment per neurology. 4. Osteopenia: Patient's most recent bone mineral density in September 2016 was reported as -2.0 which is unchanged from 2 years prior when her T score was -1.9.  Approximately 30 minutes was spent in discussion of which greater than 50% was  consultation.  Patient expressed understanding and was in agreement with this plan. She also understands that She can call clinic at any time with any questions, concerns, or complaints.    Lloyd Huger, MD   09/19/2016 11:28 PM

## 2016-09-21 ENCOUNTER — Ambulatory Visit: Payer: PPO | Admitting: Oncology

## 2016-10-02 ENCOUNTER — Ambulatory Visit (INDEPENDENT_AMBULATORY_CARE_PROVIDER_SITE_OTHER): Payer: PPO | Admitting: Family Medicine

## 2016-10-02 ENCOUNTER — Encounter: Payer: Self-pay | Admitting: Family Medicine

## 2016-10-02 VITALS — BP 100/70 | HR 72 | Ht 67.0 in | Wt 152.0 lb

## 2016-10-02 DIAGNOSIS — E785 Hyperlipidemia, unspecified: Secondary | ICD-10-CM | POA: Diagnosis not present

## 2016-10-02 DIAGNOSIS — M545 Low back pain, unspecified: Secondary | ICD-10-CM

## 2016-10-02 DIAGNOSIS — E860 Dehydration: Secondary | ICD-10-CM | POA: Diagnosis not present

## 2016-10-02 DIAGNOSIS — G8929 Other chronic pain: Secondary | ICD-10-CM | POA: Diagnosis not present

## 2016-10-02 DIAGNOSIS — R42 Dizziness and giddiness: Secondary | ICD-10-CM | POA: Diagnosis not present

## 2016-10-02 NOTE — Patient Instructions (Signed)
Low Back Pain: Brief Version   What is low back pain? How does it happen?  Low back pain is pain or stiffness in the lower back. Most of the time, it is caused when a muscle in your back is strained. For example, it can happen when you lift a heavy object or when you sit or stand for a long time. Health problems, such as arthritis, can also cause back pain. Low back pain may last a day or two, several weeks, or longer. You may have pain in one spot or it may spread down the buttocks and into your legs. You should see your health care provider right away if you have back pain with these symptoms:   You cannot control your bladder or bowels.  You have a hard time moving your legs or walking.  Your arms or legs are numb or tingling.  These symptoms may mean you have hurt your spine and nerves. When you see your health care provider, he or she will:   Ask about your symptoms.  Give you an exam. X-rays or other tests may also be done.  How is it treated?  Here are some good ideas for taking care of low back pain:   Use an electric heating pad on a low setting (or a hot water bottle wrapped in a towel) for 20 to 30 minutes. (Don't let the heating pad get too hot, and don't fall asleep with it. You could get a burn.)  Use an ice pack wrapped in a towel for 20 minutes, one to four times a day. (Don't leave it on too long. You could get frostbite. Set an alarm to remind you.)  Take aspirin, ibuprofen, or other pain medicines your health care provider may suggest.  Ask about exercises you can do to stretch and strengthen your back muscles.  When you sleep or lie down, keep these hints in mind:   Rest on a firm mattress. It may help to lie on your back with your knees raised or lie on your side with your knees bent.  Put a pillow under your knees when you are lying down.  Sleep without a pillow under your head. Talk to your health care provider about whether it would help to:  Wear a  belt or corset to support your back.  Make visits to a physical therapist for traction.  Have your back massaged by a trained person. Take it easy at first. As you start to feel better, you'll be able to do more and more. But be careful. You may need to cut back on what you do:  If your symptoms come back.  If you have more pain after you start doing more or something new.  See your health care provider if your pain is worse even with treatment. How can I take care of myself?  You can lower the strain on your back. Here are some ideas that can help:   Try to get to and keep a healthy weight.  Use good posture. Stand with your head up, shoulders straight, chest forward, your weight on both feet, and your pelvis tucked in.  Sit in a straight-backed chair and hold your spine against the back of the chair.  Sit close to the pedals when you drive. Use your seat belt and a hard backrest or pillow.  Use a footrest for one foot when you stand or sit in one spot for a long time. This keeps your back straight.  Bend your knees when you bend over.  Here are tips when you need to lift or move heavy objects:   Don't push with your arms when you move a heavy object. Turn around and push backwards so your legs take the strain.  Bend your knees and hips and keep your back straight when you lift a heavy object.  Don't lift heavy objects higher than your waist.  Hold packages you carry close to your body, with your arms bent. To rest your back, do these exercises for 5 minutes or longer:  Lie on your back, bend your knees, and put pillows under your knees.  Lie on your back, put a pillow under your neck, bend your knees to a 90- degree angle, and put your lower legs and feet on a chair.  Lie on your back, bend your knees, and bring one knee up to your chest and hold it there. Repeat with the other knee, then bring both knees to your chest. When holding your knee to your chest, grab your  thigh rather than your lower leg.

## 2016-10-02 NOTE — Progress Notes (Signed)
Name: Veronica Ellis   MRN: [id number]    DOB: 05-29-1934   Date:10/02/2016       Progress Note  Subjective  Chief Complaint  Chief Complaint  Patient presents with  . Back Pain    Back Pain  This is a chronic problem. The current episode started more than 1 year ago. The problem occurs daily. The problem has been waxing and waning since onset. The pain is present in the lumbar spine. The quality of the pain is described as aching. The pain radiates to the left foot, left thigh, left knee, right foot, right knee and right thigh. The pain is moderate. The symptoms are aggravated by standing and bending. Stiffness is present all day. Associated symptoms include bowel incontinence and headaches. Pertinent negatives include no abdominal pain, bladder incontinence, chest pain, dysuria, fever, leg pain, numbness, paresis, paresthesias, pelvic pain, perianal numbness, tingling, weakness or weight loss. She has tried NSAIDs for the symptoms.  Dizziness  This is a recurrent problem. The problem occurs intermittently (last 2 weeks ago). Associated symptoms include headaches and vertigo. Pertinent negatives include no abdominal pain, arthralgias, chest pain, chills, coughing, fever, myalgias, nausea, neck pain, numbness, rash, sore throat or weakness. The treatment provided mild relief.  Hyperlipidemia  This is a chronic problem. The problem is controlled. Pertinent negatives include no chest pain, focal weakness, leg pain, myalgias or shortness of breath.    No problem-specific Assessment & Plan notes found for this encounter.   Past Medical History:  Diagnosis Date  . Anxiety   . Breast cancer ([place]) 2014   bilateral invasive mammary carcinoma. with 36 rad tx.   . Cancer ([place])    breast  . Depression   . Hyperlipemia   . Hypertension     Past Surgical History:  Procedure Laterality Date  . APPENDECTOMY    . BREAST BIOPSY Bilateral 2014  . BREAST SURGERY Bilateral   . COLON SURGERY      12 in. removed due to polyps  . TUBAL LIGATION      Family History  Problem Relation Age of Onset  . Breast cancer Daughter 59  . Breast cancer Daughter 75    Social History   Social History  . Marital status: Widowed    Spouse name: N/A  . Number of children: N/A  . Years of education: N/A   Occupational History  . Not on file.   Social History Main Topics  . Smoking status: Former Smoker    Types: Cigarettes  . Smokeless tobacco: Never Used  . Alcohol use 0.0 oz/week     Comment: Occ  . Drug use: No  . Sexual activity: Yes   Other Topics Concern  . Not on file   Social History Narrative  . No narrative on file    Allergies  Allergen Reactions  . Erythromycin   . Iodine   . Penicillins   . Shellfish Allergy      Review of Systems  Constitutional: Negative for chills, fever, malaise/fatigue and weight loss.  HENT: Negative for ear discharge, ear pain and sore throat.   Eyes: Negative for blurred vision.  Respiratory: Negative for cough, sputum production, shortness of breath and wheezing.   Cardiovascular: Negative for chest pain, palpitations and leg swelling.  Gastrointestinal: Positive for bowel incontinence. Negative for abdominal pain, blood in stool, constipation, diarrhea, heartburn, melena and nausea.  Genitourinary: Negative for bladder incontinence, dysuria, frequency, hematuria, pelvic pain and urgency.  Musculoskeletal: Positive for back  pain. Negative for arthralgias, joint pain, myalgias and neck pain.  Skin: Negative for rash.  Neurological: Positive for dizziness, vertigo and headaches. Negative for tingling, sensory change, focal weakness, weakness, numbness and paresthesias.  Endo/Heme/Allergies: Negative for environmental allergies and polydipsia. Does not bruise/bleed easily.  Psychiatric/Behavioral: Negative for depression and suicidal ideas. The patient is not nervous/anxious and does not have insomnia.      Objective  Vitals:    10/02/16 1049  BP: 100/70  Pulse: 72  Weight: 152 lb (68.9 kg)  Height: 5\' 7"  (1.702 m)    Physical Exam  Constitutional: She is well-developed, well-nourished, and in no distress. No distress.  HENT:  Head: Normocephalic and atraumatic.  Right Ear: External ear normal.  Left Ear: External ear normal.  Nose: Nose normal.  Mouth/Throat: Oropharynx is clear and moist.  Eyes: Conjunctivae and EOM are normal. Pupils are equal, round, and reactive to light. Right eye exhibits no discharge. Left eye exhibits no discharge.  Neck: Normal range of motion. Neck supple. No JVD present. No thyromegaly present.  Cardiovascular: Normal rate, regular rhythm, normal heart sounds and intact distal pulses.  Exam reveals no gallop and no friction rub.   No murmur heard. Pulmonary/Chest: Effort normal and breath sounds normal. She has no wheezes. She has no rales.  Abdominal: Soft. Bowel sounds are normal. She exhibits no mass. There is no tenderness. There is no guarding.  Musculoskeletal: Normal range of motion. She exhibits no edema.       Lumbar back: She exhibits spasm.  Lymphadenopathy:    She has no cervical adenopathy.  Neurological: She is alert. She has normal reflexes.  Skin: Skin is warm and dry. She is not diaphoretic.  Psychiatric: Mood and affect normal.  Nursing note and vitals reviewed.     Assessment & Plan  Problem List Items Addressed This Visit    None    Visit Diagnoses    Chronic bilateral low back pain without sciatica    -  Primary   Relevant Orders   Ambulatory referral to Physical Therapy   Orthostatic dizziness       Dehydration, mild       encourage fluids   Hyperlipidemia, unspecified hyperlipidemia type       Relevant Orders   Lipid Profile        Dr. Otilio Miu [facility]  10/02/16

## 2016-10-03 LAB — LIPID PANEL
CHOLESTEROL TOTAL: 189 mg/dL (ref 100–199)
Chol/HDL Ratio: 4 ratio units (ref 0.0–4.4)
HDL: 47 mg/dL (ref >39)
LDL CALC: 115 mg/dL — AB (ref 0–99)
TRIGLYCERIDES: 135 mg/dL (ref 0–149)
VLDL Cholesterol Cal: 27 mg/dL (ref 5–40)

## 2016-10-04 DIAGNOSIS — G8929 Other chronic pain: Secondary | ICD-10-CM | POA: Diagnosis not present

## 2016-10-04 DIAGNOSIS — M545 Low back pain: Secondary | ICD-10-CM | POA: Diagnosis not present

## 2016-10-04 NOTE — Progress Notes (Signed)
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  INTERVAL HISTORY: Patient last evaluated in clinic in June 2017. She has missed multiple appointments in the interim. Patient is concerned about a foot sore she developed after stepping on a nail.  She reports chronic fatigue and weakness, and chronic shortness of breath due to COPD.  She also reports occasional sharp, stabbing chest pains, and states it may be indigestion.   She reports chronic headache pain "from the neck up", relieved with tylenol and ibuprofen.  She continues to be highly anxious. She reports alternating constipation and diarrhea, but denies abdominal pain.  She reports very dark stools on occasion, but denies hematochezia.  She denies any recent fevers, chills, or illnesses.  Her appetite is good and she denies any weight loss.  She denies any nausea or vomiting.  She reports urinary urgency and incomplete emptying, no other urinary complaints.  She reports short-term memory loss, numerous falls at home, none resulting in serious injury, and she lives alone.  She reports insomnia.  Patient offers no further specific complaints today.   REVIEW OF SYSTEMS:   Review of Systems  Constitutional: Positive for malaise/fatigue. Negative for chills, fever and weight loss.  HENT: Negative for congestion.   Respiratory: Positive for shortness of breath.   Cardiovascular: Positive for chest pain.  Gastrointestinal: Positive for constipation, diarrhea and heartburn. Negative for abdominal pain, nausea and vomiting.  Genitourinary: Positive for urgency. Negative for frequency and hematuria.    Musculoskeletal: Positive for falls and neck pain.  Neurological: Positive for sensory change, weakness and headaches.  Psychiatric/Behavioral: Positive for memory loss. The patient is nervous/anxious and has insomnia.     As per HPI. Otherwise, a complete review of systems is negative.  PAST MEDICAL HISTORY: Past Medical History:  Diagnosis Date  . Anxiety   . Breast cancer ([place]) 2014   bilateral invasive mammary carcinoma. with 36 rad tx.   . Cancer ([place])    breast  . Depression   . Hyperlipemia   . Hypertension     PAST SURGICAL HISTORY: Past Surgical History:  Procedure Laterality Date  . APPENDECTOMY    . BREAST BIOPSY Bilateral 2014  . BREAST SURGERY Bilateral   . COLON SURGERY     12 in. removed due to polyps  . TUBAL LIGATION      FAMILY HISTORY Family History  Problem Relation Age of Onset  . Breast cancer Daughter 18  . Breast cancer Daughter 62       ADVANCED DIRECTIVES:    HEALTH MAINTENANCE: Social History  Substance Use Topics  . Smoking status: Former Smoker    Types: Cigarettes  . Smokeless tobacco: Never Used  . Alcohol use 0.0 oz/week     Comment: Occ     Colonoscopy:  PAP:  Bone density:  Lipid panel:  Allergies  Allergen Reactions  . Erythromycin   . Iodine   . Penicillins   . Shellfish Allergy     Current Outpatient Prescriptions  Medication Sig Dispense Refill  . aspirin EC 81 MG tablet Take by mouth.    . isosorbide mononitrate (IMDUR) 30 MG 24 hr tablet Take 1 tablet by mouth daily.    Marland Kitchen  letrozole (FEMARA) 2.5 MG tablet Take 1 tablet (2.5 mg total) by mouth daily. 90 tablet 1   Current Facility-Administered Medications  Medication Dose Route Frequency Provider Last Rate Last Dose  . albuterol (PROVENTIL) (2.5 MG/3ML) 0.083% nebulizer solution 2.5 mg  2.5 mg Nebulization Once Juline Patch, MD        OBJECTIVE: Vitals:   10/05/16 0924  BP: 134/67  Pulse: 74  Resp: 18  Temp: 99 F (37.2 C)     Body mass index  is 24.01 kg/m.    ECOG FS:0 - Asymptomatic  General: Well-developed, well-nourished, no acute distress. Eyes: Pink conjunctiva, anicteric sclera. Breasts: Bilateral breasts and axilla without lumps or masses. Heart: Regular rate and rhythm. No rubs, murmurs, or gallops. Lungs: Clear to auscultation bilaterally. Abdomen: Soft, nontender, nondistended. No organomegaly noted, normoactive bowel sounds. Musculoskeletal: No edema, cyanosis, or clubbing. Neuro: Alert, answering all questions appropriately. Cranial nerves grossly intact. Skin: No rashes or petechiae noted. Pinprick red puncture wound on right foot under 2nd toe, no s/sx of infection. Deadened dry skin sticking out below wound perpendicular to foot bed. Psych: Normal affect.   LAB RESULTS:  Lab Results  Component Value Date   NA 138 04/22/2016   K 3.8 04/22/2016   CL 106 04/22/2016   CO2 23 04/22/2016   GLUCOSE 109 (H) 04/22/2016   BUN 15 04/22/2016   CREATININE 0.74 04/22/2016   CALCIUM 9.2 04/22/2016   PROT 7.3 04/22/2016   ALBUMIN 3.9 04/22/2016   AST 17 04/22/2016   ALT 13 (L) 04/22/2016   ALKPHOS 78 04/22/2016   BILITOT 0.8 04/22/2016   GFRNONAA >60 04/22/2016   GFRAA >60 04/22/2016    Lab Results  Component Value Date   WBC 6.4 04/22/2016   NEUTROABS 5.3 10/15/2014   HGB 12.1 04/22/2016   HCT 37.8 04/22/2016   MCV 74.7 (L) 04/22/2016   PLT 343 04/22/2016     STUDIES: Dg Cervical Spine Complete  Result Date: 09/14/2016 CLINICAL DATA:  Right-sided cervical pain after fall. EXAM: CERVICAL SPINE - COMPLETE 4+ VIEW COMPARISON:  CT from 03/16/2010 and and new FINDINGS: The atlantodental interval is within normal limits. No prevertebral soft tissue swelling is seen. There is disc space narrowing at C4-5, C5-6 and C6-7 with associated mild bilateral facet arthropathy. Mild neural foraminal encroachment from uncovertebral spurring at C4-5, C5-6 and C6-7 on the right and C3-4 through C6-7 on the left. Bilateral  uncovertebral joint osteoarthritic spurring from C3-4 through C7-T1 bilaterally. No acute fracture. Visualized lungs are unremarkable as are the ribs. IMPRESSION: No acute fracture. Cervical spondylosis with degenerative disc space narrowing from C4 through C7 with associated neural foraminal encroachment from facet arthropathy and uncovertebral spurring. Electronically Signed   By: Ashley Royalty M.D.   On: 09/14/2016 15:16   Dg Lumbar Spine Complete  Result Date: 09/14/2016 CLINICAL DATA:  Right-sided lower back pain after fall EXAM: LUMBAR SPINE - COMPLETE 4+ VIEW COMPARISON:  None. FINDINGS: Normal lumbar segmentation without acute fracture nor bone destruction. Mild disc space narrowing at L3-4 and L4-5 with partially calcified posterior disc bulges or small protrusions at L2-3, L3-4 and L4-5. Bones are slightly demineralized in appearance. There is facet joint space narrowing and sclerosis from L2 through S1 most severely affecting L4-5 and L5-S1. No spondylolysis nor spondylolisthesis. Ancillary findings include with right upper quadrant lateral weighted gallstone, right lower quadrant chain sutures and abdominal aortic atherosclerosis. IMPRESSION: No acute osseous abnormality. Lower lumbar facet arthropathy. Partially calcified disc bulges/small protrusions  from L2 through L5. Ancillary findings include with right upper quadrant lateral weighted gallstone, right lower quadrant chain sutures and abdominal aortic atherosclerosis. Electronically Signed   By: Ashley Royalty M.D.   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  PLAN:    1. Bilateral breast cancer:  No evidence of disease.  Previously after lengthy discussion with patient and her family about her intermediate Oncotype score, she declined adjuvant chemotherapy.  Continue letrozole completing in  January 2020.  Patient's most recent mammogram on June 04, 2016 was reported as BI-RADS 2, repeat in September 2018.  Return to clinic in 6 months for routine evaluation. 2. Chest CT abnormalities: No evidence of malignancy. No further intervention or imaging is necessary. 3. Falls/neuropathy: Treatment per neurology. 4. Osteopenia: Patient's most recent bone mineral density in September 2016 was reported as -2.0 which is unchanged from 2 years prior when her T score was -1.9. Repeat in September 2018. 5. Chest pain: Continue Imdur and aspirin.  Follow-up with cardiologist. 6. COPD/shortness of breath: Follow-up with PCP. 7. Dark stools: Follow-up with PCP to evaluate for blood in stool. 8. Foot sore: Soak daily in epsom salt, dry completely, and cover with neosporin and band-aid.  Follow-up with PCP. 9. Insomnia/Anxiety: Discussed 4-7-8 breathing technique at bedtime: breathe in to count of 4, hold breath for count of 7, exhale for count of 8; do 3-5 times for letting go of overactive thoughts.  Provided breath ratio chart to patient for relaxing, balancing, and energizing breathing techniques.   Approximately 30 minutes was spent in discussion of which greater than 50% was consultation.  Patient expressed understanding and was in agreement with this plan. She also understands that She can call clinic at any time with any questions, concerns, or complaints.   Lucendia Herrlich, NP  10/06/16 3:58 PM   Patient was seen and evaluated independently and I agree with the assessment and plan as dictated above.  Lloyd Huger, MD 10/09/16 6:14 PM

## 2016-10-05 ENCOUNTER — Inpatient Hospital Stay: Payer: PPO | Attending: Oncology | Admitting: Oncology

## 2016-10-05 ENCOUNTER — Ambulatory Visit: Payer: PPO | Admitting: Oncology

## 2016-10-05 VITALS — BP 134/67 | HR 74 | Temp 99.0°F | Resp 18 | Wt 153.3 lb

## 2016-10-05 DIAGNOSIS — R3915 Urgency of urination: Secondary | ICD-10-CM | POA: Diagnosis not present

## 2016-10-05 DIAGNOSIS — R5383 Other fatigue: Secondary | ICD-10-CM | POA: Diagnosis not present

## 2016-10-05 DIAGNOSIS — M8580 Other specified disorders of bone density and structure, unspecified site: Secondary | ICD-10-CM | POA: Insufficient documentation

## 2016-10-05 DIAGNOSIS — Z79811 Long term (current) use of aromatase inhibitors: Secondary | ICD-10-CM | POA: Insufficient documentation

## 2016-10-05 DIAGNOSIS — R079 Chest pain, unspecified: Secondary | ICD-10-CM | POA: Diagnosis not present

## 2016-10-05 DIAGNOSIS — I10 Essential (primary) hypertension: Secondary | ICD-10-CM | POA: Insufficient documentation

## 2016-10-05 DIAGNOSIS — C50912 Malignant neoplasm of unspecified site of left female breast: Secondary | ICD-10-CM | POA: Insufficient documentation

## 2016-10-05 DIAGNOSIS — R5382 Chronic fatigue, unspecified: Secondary | ICD-10-CM | POA: Diagnosis not present

## 2016-10-05 DIAGNOSIS — R413 Other amnesia: Secondary | ICD-10-CM | POA: Diagnosis not present

## 2016-10-05 DIAGNOSIS — Z9181 History of falling: Secondary | ICD-10-CM | POA: Diagnosis not present

## 2016-10-05 DIAGNOSIS — Z923 Personal history of irradiation: Secondary | ICD-10-CM | POA: Diagnosis not present

## 2016-10-05 DIAGNOSIS — J449 Chronic obstructive pulmonary disease, unspecified: Secondary | ICD-10-CM | POA: Diagnosis not present

## 2016-10-05 DIAGNOSIS — M542 Cervicalgia: Secondary | ICD-10-CM | POA: Insufficient documentation

## 2016-10-05 DIAGNOSIS — G629 Polyneuropathy, unspecified: Secondary | ICD-10-CM | POA: Diagnosis not present

## 2016-10-05 DIAGNOSIS — Z87891 Personal history of nicotine dependence: Secondary | ICD-10-CM | POA: Insufficient documentation

## 2016-10-05 DIAGNOSIS — K5900 Constipation, unspecified: Secondary | ICD-10-CM | POA: Diagnosis not present

## 2016-10-05 DIAGNOSIS — R197 Diarrhea, unspecified: Secondary | ICD-10-CM | POA: Diagnosis not present

## 2016-10-05 DIAGNOSIS — Z79899 Other long term (current) drug therapy: Secondary | ICD-10-CM

## 2016-10-05 DIAGNOSIS — F419 Anxiety disorder, unspecified: Secondary | ICD-10-CM | POA: Diagnosis not present

## 2016-10-05 DIAGNOSIS — R5381 Other malaise: Secondary | ICD-10-CM

## 2016-10-05 DIAGNOSIS — E785 Hyperlipidemia, unspecified: Secondary | ICD-10-CM | POA: Diagnosis not present

## 2016-10-05 DIAGNOSIS — C50911 Malignant neoplasm of unspecified site of right female breast: Secondary | ICD-10-CM

## 2016-10-05 DIAGNOSIS — Z880 Allergy status to penicillin: Secondary | ICD-10-CM | POA: Insufficient documentation

## 2016-10-05 DIAGNOSIS — R51 Headache: Secondary | ICD-10-CM | POA: Diagnosis not present

## 2016-10-05 DIAGNOSIS — Z7982 Long term (current) use of aspirin: Secondary | ICD-10-CM | POA: Diagnosis not present

## 2016-10-05 DIAGNOSIS — Z170 Estrogen receptor positive status [ER+]: Secondary | ICD-10-CM | POA: Diagnosis not present

## 2016-10-05 DIAGNOSIS — G4700 Insomnia, unspecified: Secondary | ICD-10-CM | POA: Diagnosis not present

## 2016-10-10 ENCOUNTER — Other Ambulatory Visit: Payer: Self-pay

## 2016-10-10 DIAGNOSIS — R410 Disorientation, unspecified: Secondary | ICD-10-CM

## 2016-10-11 DIAGNOSIS — G8929 Other chronic pain: Secondary | ICD-10-CM | POA: Diagnosis not present

## 2016-10-11 DIAGNOSIS — M5441 Lumbago with sciatica, right side: Secondary | ICD-10-CM | POA: Diagnosis not present

## 2016-10-11 DIAGNOSIS — R270 Ataxia, unspecified: Secondary | ICD-10-CM | POA: Diagnosis not present

## 2016-10-11 DIAGNOSIS — R413 Other amnesia: Secondary | ICD-10-CM | POA: Diagnosis not present

## 2016-10-11 DIAGNOSIS — M5442 Lumbago with sciatica, left side: Secondary | ICD-10-CM | POA: Diagnosis not present

## 2016-10-11 DIAGNOSIS — R42 Dizziness and giddiness: Secondary | ICD-10-CM | POA: Diagnosis not present

## 2016-10-18 DIAGNOSIS — G8929 Other chronic pain: Secondary | ICD-10-CM | POA: Insufficient documentation

## 2016-10-18 DIAGNOSIS — M5441 Lumbago with sciatica, right side: Secondary | ICD-10-CM

## 2016-10-18 DIAGNOSIS — M5442 Lumbago with sciatica, left side: Secondary | ICD-10-CM

## 2016-11-05 ENCOUNTER — Other Ambulatory Visit: Payer: Self-pay | Admitting: Neurology

## 2016-11-05 DIAGNOSIS — M545 Low back pain: Secondary | ICD-10-CM

## 2016-11-14 ENCOUNTER — Ambulatory Visit
Admission: RE | Admit: 2016-11-14 | Discharge: 2016-11-14 | Disposition: A | Discharge status: Home / Self Care | Payer: PPO | Source: Ambulatory Visit | Attending: Neurology | Admitting: Neurology

## 2016-12-25 ENCOUNTER — Emergency Department: Payer: PPO

## 2016-12-25 ENCOUNTER — Other Ambulatory Visit: Payer: Self-pay

## 2016-12-25 ENCOUNTER — Emergency Department
Admission: EM | Admit: 2016-12-25 | Discharge: 2016-12-26 | Disposition: A | Discharge status: Home / Self Care | Payer: PPO | Attending: Emergency Medicine | Admitting: Emergency Medicine

## 2016-12-25 ENCOUNTER — Encounter: Payer: Self-pay | Admitting: Emergency Medicine

## 2016-12-25 DIAGNOSIS — Z79899 Other long term (current) drug therapy: Secondary | ICD-10-CM | POA: Insufficient documentation

## 2016-12-25 DIAGNOSIS — F329 Major depressive disorder, single episode, unspecified: Secondary | ICD-10-CM | POA: Insufficient documentation

## 2016-12-25 DIAGNOSIS — Z7982 Long term (current) use of aspirin: Secondary | ICD-10-CM | POA: Insufficient documentation

## 2016-12-25 DIAGNOSIS — M549 Dorsalgia, unspecified: Secondary | ICD-10-CM | POA: Diagnosis not present

## 2016-12-25 DIAGNOSIS — F3162 Bipolar disorder, current episode mixed, moderate: Secondary | ICD-10-CM | POA: Diagnosis not present

## 2016-12-25 DIAGNOSIS — Z853 Personal history of malignant neoplasm of breast: Secondary | ICD-10-CM | POA: Insufficient documentation

## 2016-12-25 DIAGNOSIS — R400 Somnolence: Secondary | ICD-10-CM | POA: Diagnosis not present

## 2016-12-25 DIAGNOSIS — R4182 Altered mental status, unspecified: Secondary | ICD-10-CM

## 2016-12-25 DIAGNOSIS — I10 Essential (primary) hypertension: Secondary | ICD-10-CM | POA: Insufficient documentation

## 2016-12-25 DIAGNOSIS — Z87891 Personal history of nicotine dependence: Secondary | ICD-10-CM | POA: Insufficient documentation

## 2016-12-25 DIAGNOSIS — F32A Depression, unspecified: Secondary | ICD-10-CM

## 2016-12-25 LAB — URINE DRUG SCREEN, QUALITATIVE (ARMC ONLY)
Amphetamines, Ur Screen: NOT DETECTED
BARBITURATES, UR SCREEN: NOT DETECTED
BENZODIAZEPINE, UR SCRN: NOT DETECTED
Cannabinoid 50 Ng, Ur ~~LOC~~: NOT DETECTED
Cocaine Metabolite,Ur ~~LOC~~: NOT DETECTED
MDMA (Ecstasy)Ur Screen: NOT DETECTED
METHADONE SCREEN, URINE: NOT DETECTED
OPIATE, UR SCREEN: NOT DETECTED
PHENCYCLIDINE (PCP) UR S: NOT DETECTED
Tricyclic, Ur Screen: NOT DETECTED

## 2016-12-25 LAB — COMPREHENSIVE METABOLIC PANEL
ALK PHOS: 63 U/L (ref 38–126)
ALT: 9 U/L — ABNORMAL LOW (ref 14–54)
ANION GAP: 7 (ref 5–15)
AST: 17 U/L (ref 15–41)
Albumin: 3.9 g/dL (ref 3.5–5.0)
BILIRUBIN TOTAL: 0.6 mg/dL (ref 0.3–1.2)
BUN: 17 mg/dL (ref 6–20)
CALCIUM: 9.1 mg/dL (ref 8.9–10.3)
CO2: 24 mmol/L (ref 22–32)
Chloride: 106 mmol/L (ref 101–111)
Creatinine, Ser: 0.73 mg/dL (ref 0.44–1.00)
GFR calc Af Amer: >60 mL/min (ref >60)
Glucose, Bld: 111 mg/dL — ABNORMAL HIGH (ref 65–99)
POTASSIUM: 3.8 mmol/L (ref 3.5–5.1)
Sodium: 137 mmol/L (ref 135–145)
Total Protein: 7.3 g/dL (ref 6.5–8.1)

## 2016-12-25 LAB — CBC
HEMATOCRIT: 33.9 % — AB (ref 35.0–47.0)
Hemoglobin: 10.8 g/dL — ABNORMAL LOW (ref 12.0–16.0)
MCH: 22 pg — ABNORMAL LOW (ref 26.0–34.0)
MCHC: 31.7 g/dL — AB (ref 32.0–36.0)
MCV: 69.4 fL — AB (ref 80.0–100.0)
PLATELETS: 365 10*3/uL (ref 150–440)
RBC: 4.89 MIL/uL (ref 3.80–5.20)
RDW: 19.8 % — ABNORMAL HIGH (ref 11.5–14.5)
WBC: 6 10*3/uL (ref 3.6–11.0)

## 2016-12-25 LAB — ETHANOL

## 2016-12-25 LAB — ACETAMINOPHEN LEVEL

## 2016-12-25 LAB — SALICYLATE LEVEL: Salicylate Lvl: <7 mg/dL (ref 2.8–30.0)

## 2016-12-25 NOTE — ED Notes (Signed)
Pt dressed out into appropriate behavioral health clothing. Pt belongings consist of a blue shirt, blue shoes, blue jeans, a gray and tan bra, a brown purse, a yellow and white ring with a clear stone. A yellow ring with a white stone and a yellow ring with a heart. Specimen cup with rings wear placed inside pt brown purse. Pt has on a depend at this moment and is aware we do not have them here that we only have briefs and mesh underwear. Pt walked back to rm 22.

## 2016-12-25 NOTE — ED Triage Notes (Signed)
Patient states she has not slept for 2 nights.  Additionally, patient states she is off her Zoloft (for 3 or 4 months) due to diarrhea.  Patient states that she has been doing fine, but lately finding that she is more forgetful, forgetting doctors appointments, frequent falls.  Seen by neurology, but then forgot to go to MRI appointment.  Patient is awake and alert in triage.  Thoughts are racing.  Difficult focusing.  Denies HI, but states "She thinks about going into the river and dying", states she thinks about her daughter who died one year ago of cancer.  Patient has become tearful in triage.

## 2016-12-25 NOTE — ED Notes (Signed)
Gave pt blanket and extra pillow.Veronica Ellis pt water.

## 2016-12-25 NOTE — ED Notes (Signed)
Pt ambulated to the bathroom and returned to her room without difficulty.

## 2016-12-25 NOTE — ED Provider Notes (Signed)
[facility] Emergency Department Provider Note   ____________________________________________   I have reviewed the triage vital signs and the nursing notes.   HISTORY  Chief Complaint Medical Clearance   History limited by: Not Limited   HPI Veronica Ellis is a 83 y.o. female who presents to the emergency department today because of primary concern for decreased sleep. She states that she has not been able to sleep well the past couple of nights. Tried to talk to her PCP who she states refused to prescribe sleep medication. She does state that she has been depressed. She thinks about the death of her daughter a lot, especially at night when she is sleeping. No suicidal ideation.    Past Medical History:  Diagnosis Date  . Anxiety   . Breast cancer ([place]) 2014   bilateral invasive mammary carcinoma. with 36 rad tx.   . Cancer ([place])    breast  . Depression   . Hyperlipemia   . Hypertension     Patient Active Problem List   Diagnosis Date Noted  . Bilateral breast cancer ([place]) 02/20/2016  . 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 Cerebrovascular disease 02/16/2015  . Anxiety 02/16/2015  . Breast lump 02/16/2015  . 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 Routine general medical examination at a health care facility 02/16/2015  . Recurrent major depressive episodes ([place]) 02/16/2015  . Below normal amount of sodium in the blood 02/16/2015  . Pre-operative cardiovascular examination 02/16/2015    Past Surgical History:  Procedure Laterality Date  . APPENDECTOMY    . BREAST BIOPSY Bilateral 2014  . BREAST SURGERY Bilateral   . COLON SURGERY     12 in. removed due to polyps  . TUBAL LIGATION      Prior to Admission medications   Medication Sig Start Date End Date Taking? Authorizing Provider  aspirin EC 81 MG tablet Take by mouth.    Historical Provider, MD  isosorbide  mononitrate (IMDUR) 30 MG 24 hr tablet Take 1 tablet by mouth daily. 01/20/16   Historical Provider, MD  letrozole (FEMARA) 2.5 MG tablet Take 1 tablet (2.5 mg total) by mouth daily. 06/22/16   Lloyd Huger, MD    Allergies Erythromycin; Iodine; Penicillins; and Shellfish allergy  Family History  Problem Relation Age of Onset  . Breast cancer Daughter 69  . Breast cancer Daughter 49    Social History Social History  Substance Use Topics  . Smoking status: Former Smoker    Types: Cigarettes  . Smokeless tobacco: Never Used  . Alcohol use 0.0 oz/week     Comment: Occ    Review of Systems  Constitutional: Negative for fever. Cardiovascular: Negative for chest pain. Respiratory: Negative for shortness of breath. Gastrointestinal: Negative for abdominal pain, vomiting and diarrhea. Genitourinary: Negative for dysuria. Musculoskeletal: Positive for back pain. Skin: Negative for rash. Neurological: Negative for headaches, focal weakness or numbness. Psychiatric: Positive for depression. 10-point ROS otherwise negative.  ____________________________________________   PHYSICAL EXAM:  VITAL SIGNS: ED Triage Vitals  Enc Vitals Group     BP 12/25/16 1601 (!) 151/71     Pulse Rate 12/25/16 1601 87     Resp 12/25/16 1601 16     Temp 12/25/16 1601 98.5 F (36.9 C)     Temp Source 12/25/16 1601 Oral     SpO2 12/25/16 1601 98 %     Weight 12/25/16 1558 145 lb (65.8 kg)  Height 12/25/16 1558 5\' 7"  (1.702 m)    Constitutional: Alert and oriented. Depressed, slightly tearful. Eyes: Conjunctivae are normal. Normal extraocular movements. ENT   Head: Normocephalic and atraumatic.   Nose: No congestion/rhinnorhea.   Mouth/Throat: Mucous membranes are moist.   Neck: No stridor. Hematological/Lymphatic/Immunilogical: No cervical lymphadenopathy. Cardiovascular: Normal rate, regular rhythm.  No murmurs, rubs, or gallops. Respiratory: Normal respiratory effort  without tachypnea nor retractions. Breath sounds are clear and equal bilaterally. No wheezes/rales/rhonchi. Gastrointestinal: Soft and non tender. No rebound. No guarding.  Genitourinary: Deferred Musculoskeletal: Normal range of motion in all extremities. No lower extremity edema. Neurologic:  Normal speech and language. No gross focal neurologic deficits are appreciated.  Skin:  Skin is warm, dry and intact. No rash noted. Psychiatric: Depressed.  ____________________________________________    LABS (pertinent positives/negatives)  Labs Reviewed  COMPREHENSIVE METABOLIC PANEL - Abnormal; Notable for the following:       Result Value   Glucose, Bld 111 (*)    ALT 9 (*)    All other components within normal limits  ACETAMINOPHEN LEVEL - Abnormal; Notable for the following:    Acetaminophen (Tylenol), Serum <10 (*)    All other components within normal limits  CBC - Abnormal; Notable for the following:    Hemoglobin 10.8 (*)    HCT 33.9 (*)    MCV 69.4 (*)    MCH 22.0 (*)    MCHC 31.7 (*)    RDW 19.8 (*)    All other components within normal limits  ETHANOL  SALICYLATE LEVEL  URINE DRUG SCREEN, QUALITATIVE ([facility] ONLY)     ____________________________________________   EKG  None  ____________________________________________    RADIOLOGY  CT head IMPRESSION:  No acute intracranial abnormality. Please see above.    ____________________________________________   PROCEDURES  Procedures  ____________________________________________   INITIAL IMPRESSION / ASSESSMENT AND PLAN / ED COURSE  Pertinent labs & imaging results that were available during my care of the patient were reviewed by me and considered in my medical decision making (see chart for details).  Patient presented to the emergency department today with primary concerns for depression. Patient was seen by psychiatry would like to observe overnight and reassess in the  morning.  ____________________________________________   FINAL CLINICAL IMPRESSION(S) / ED DIAGNOSES  Depression  Note: This dictation was prepared with Dragon dictation. Any transcriptional errors that result from this process are unintentional     Nance Pear, MD 12/25/16 2352

## 2016-12-25 NOTE — ED Notes (Signed)
Patient assigned to appropriate care area. Patient oriented to unit/care area: Informed that, for their safety, care areas are designed for safety and monitored by security cameras at all times; and visiting hours explained to patient. Patient verbalizes understanding, and verbal contract for safety obtained. 

## 2016-12-25 NOTE — ED Notes (Signed)
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
## 2016-12-25 NOTE — ED Notes (Signed)
BEHAVIORAL HEALTH ROUNDING Patient sleeping: No. Patient alert and oriented: yes Behavior appropriate: Yes.  ; If no, describe:  Nutrition and fluids offered: yes Toileting and hygiene offered: Yes  Sitter present: q15 minute observations and security  monitoring Law enforcement present: Yes  ODS  

## 2016-12-26 ENCOUNTER — Ambulatory Visit: Payer: PPO | Admitting: Family Medicine

## 2016-12-26 NOTE — ED Notes (Signed)
Helped pt to the bathroom 

## 2016-12-26 NOTE — ED Notes (Signed)
Pt on phone with boyfriend.

## 2016-12-26 NOTE — ED Notes (Signed)
Patient observed lying in bed with eyes closed  Even, unlabored respirations observed   NAD pt appears to be sleeping  I will continue to monitor along with every 15 minute visual observations and ongoing security monitoring    

## 2016-12-26 NOTE — ED Notes (Signed)
BEHAVIORAL HEALTH ROUNDING Patient sleeping: No. Patient alert and oriented: yes Behavior appropriate: Yes.  ; If no, describe:  Nutrition and fluids offered: yes Toileting and hygiene offered: Yes  Sitter present: q15 minute observations and security  monitoring Law enforcement present: Yes  ODS  

## 2016-12-26 NOTE — ED Notes (Signed)
ED BHU [place] Is the patient under IVC or is there intent for IVC: no Is the patient medically cleared: Yes.   Is there vacancy in the ED BHU: Yes.   Is the population mix appropriate for patient: Yes.   Is the patient awaiting placement in inpatient or outpatient setting:  Has the patient had a psychiatric consult:  Reevaluation by consultant pending   Survey of unit performed for contraband, proper placement and condition of furniture, tampering with fixtures in bathroom, shower, and each patient room: Yes.  ; Findings:  APPEARANCE/BEHAVIOR Calm and cooperative NEURO ASSESSMENT Orientation: oriented x3  Denies pain Hallucinations: No.None noted (Hallucinations) Speech: Normal Gait: normal RESPIRATORY ASSESSMENT Even  Unlabored respirations  CARDIOVASCULAR ASSESSMENT Pulses equal   regular rate  Skin warm and dry   GASTROINTESTINAL ASSESSMENT no GI complaint EXTREMITIES Full ROM  PLAN OF CARE Provide calm/safe environment. Vital signs assessed twice daily. ED BHU Assessment once each 12-hour shift. Collaborate with TTS daily or as condition indicates. Assure the ED provider has rounded once each shift. Provide and encourage hygiene. Provide redirection as needed. Assess for escalating behavior; address immediately and inform ED provider.  Assess family dynamic and appropriateness for visitation as needed: Yes.  ; If necessary, describe findings:  Educate the patient/family about BHU procedures/visitation: Yes.  ; If necessary, describe findings:

## 2016-12-26 NOTE — ED Notes (Signed)

## 2016-12-26 NOTE — ED Notes (Signed)
She received a call from someone stating they are her fiance  - phone provided to her

## 2016-12-26 NOTE — Discharge Instructions (Signed)
You have been seen in the emergency department for a  psychiatric concern. You have been evaluated both medically as well as psychiatrically. Please follow-up with your outpatient resources provided. Return to the emergency department for any worsening symptoms, or any thoughts of hurting yourself or anyone else so that we may attempt to help you. 

## 2016-12-26 NOTE — ED Provider Notes (Signed)
-----------------------------------------   5:04 PM on 12/26/2016 -----------------------------------------  Patient has been seen by psychiatry. They believe the patient is safe for discharge home with outpatient resources.   Harvest Dark, MD 12/26/16 [phone]

## 2016-12-26 NOTE — ED Notes (Signed)
Son and daughter with a visit observed by pt relation  Pt observed in NAD  Questions answered

## 2016-12-26 NOTE — ED Provider Notes (Signed)
-----------------------------------------   6:53 AM on 12/26/2016 -----------------------------------------   Blood pressure (!) 110/55, pulse 62, temperature 98.8 F (37.1 C), temperature source Oral, resp. rate 16, height 5\' 7"  (1.702 m), weight 145 lb (65.8 kg), SpO2 98 %.  The patient had no acute events since last update.  Calm and cooperative at this time.  Disposition is pending Psychiatry/Behavioral Medicine team recommendations.     Paulette Blanch, MD 12/26/16 [phone]

## 2016-12-26 NOTE — ED Notes (Signed)
Pt given supper tray.

## 2016-12-26 NOTE — ED Notes (Signed)
She has received a phone call from a different daughter   I can hear the pt talking  - she appears happy  And in no apparent distress  Continue to monitor
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
## 2016-12-26 NOTE — ED Notes (Signed)
Pt stated she wanted to get the day going and take a shower. Pt taken to shower and pt did not like where the shower was located and stated she would just wash off in her room. Pt did not want to take a shower. Pt given all shower items and is washing and changing clothes in pt room.

## 2016-12-26 NOTE — ED Notes (Signed)
Pt talking on telephone

## 2016-12-26 NOTE — ED Notes (Signed)
Pt son and daughter visited with pt.

## 2016-12-26 NOTE — ED Notes (Signed)
Pt given lunch tray.

## 2016-12-26 NOTE — ED Notes (Signed)
Pt stated she wants to wait until she goes home today to take a shower. Pt agreed that if Doctor does not send her home we will talk again about taking a shower. Pt requested toothbrush and toothpaste. Informed pt we are waiting on supply to bring [place] some and will get her one asap.

## 2016-12-26 NOTE — ED Notes (Signed)
Pt given breakfast tray. Bed put in upright position

## 2016-12-28 DIAGNOSIS — H2511 Age-related nuclear cataract, right eye: Secondary | ICD-10-CM | POA: Diagnosis not present

## 2016-12-31 ENCOUNTER — Encounter: Payer: Self-pay | Admitting: Family Medicine

## 2016-12-31 ENCOUNTER — Ambulatory Visit (INDEPENDENT_AMBULATORY_CARE_PROVIDER_SITE_OTHER): Payer: PPO | Admitting: Family Medicine

## 2016-12-31 VITALS — BP 130/70 | HR 64 | Ht 67.0 in | Wt 150.0 lb

## 2016-12-31 DIAGNOSIS — F3341 Major depressive disorder, recurrent, in partial remission: Secondary | ICD-10-CM | POA: Diagnosis not present

## 2016-12-31 DIAGNOSIS — R270 Ataxia, unspecified: Secondary | ICD-10-CM | POA: Diagnosis not present

## 2016-12-31 DIAGNOSIS — I679 Cerebrovascular disease, unspecified: Secondary | ICD-10-CM | POA: Diagnosis not present

## 2016-12-31 DIAGNOSIS — R413 Other amnesia: Secondary | ICD-10-CM | POA: Diagnosis not present

## 2016-12-31 NOTE — Progress Notes (Signed)
Name: Veronica Ellis   MRN: [id number]    DOB: [birth year]   Date:12/31/2016       Progress Note  Subjective  Chief Complaint  Chief Complaint  Patient presents with  . Follow-up    hospital d/c on 12/26/2016- needs to start on something in place of sertraline    Patient presents for followup from er evaluation.   Patint still has memory concern as well as ataxia.     Neurologic Problem  The patient's primary symptoms include clumsiness, a loss of balance and memory loss. The patient's pertinent negatives include no altered mental status, focal sensory loss, focal weakness, near-syncope, slurred speech, syncope, visual change or weakness. This is a chronic problem. The current episode started more than 1 month ago. The problem has been waxing and waning since onset. Associated symptoms include confusion and dizziness. Pertinent negatives include no abdominal pain, auditory change, aura, back pain, bladder incontinence, bowel incontinence, chest pain, diaphoresis, fatigue, fever, headaches, light-headedness, nausea, neck pain, palpitations, shortness of breath, vertigo or vomiting. The treatment provided mild relief. There is no history of a bleeding disorder, a clotting disorder, a CVA, dementia, head trauma, liver disease, mood changes or seizures.    No problem-specific Assessment & Plan notes found for this encounter.   Past Medical History:  Diagnosis Date  . Anxiety   . Breast cancer ([place]) 2014   bilateral invasive mammary carcinoma. with 36 rad tx.   . Cancer ([place])    breast  . Depression   . Hyperlipemia   . Hypertension     Past Surgical History:  Procedure Laterality Date  . APPENDECTOMY    . BREAST BIOPSY Bilateral 2014  . BREAST SURGERY Bilateral   . COLON SURGERY     12 in. removed due to polyps  . TUBAL LIGATION      Family History  Problem Relation Age of Onset  . Breast cancer Daughter 29  . Breast cancer Daughter 50    Social History   Social  History  . Marital status: Widowed    Spouse name: N/A  . Number of children: N/A  . Years of education: N/A   Occupational History  . Not on file.   Social History Main Topics  . Smoking status: Former Smoker    Types: Cigarettes  . Smokeless tobacco: Never Used  . Alcohol use 0.0 oz/week     Comment: Occ  . Drug use: No  . Sexual activity: Yes   Other Topics Concern  . Not on file   Social History Narrative  . No narrative on file    Allergies  Allergen Reactions  . Erythromycin   . Iodine   . Penicillins   . Shellfish Allergy     Outpatient Medications Prior to Visit  Medication Sig Dispense Refill  . aspirin EC 81 MG tablet Take 81 mg by mouth daily.     Marland Kitchen gabapentin (NEURONTIN) 100 MG capsule Take 200 mg by mouth 2 (two) times daily.    . isosorbide mononitrate (IMDUR) 30 MG 24 hr tablet Take 1 tablet by mouth daily.    Marland Kitchen letrozole (FEMARA) 2.5 MG tablet Take 1 tablet (2.5 mg total) by mouth daily. 90 tablet 1  . sertraline (ZOLOFT) 50 MG tablet Take 100 mg by mouth daily.     Facility-Administered Medications Prior to Visit  Medication Dose Route Frequency Provider Last Rate Last Dose  . albuterol (PROVENTIL) (2.5 MG/3ML) 0.083% nebulizer solution 2.5 mg  2.5 mg  Nebulization Once Juline Patch, MD        Review of Systems  Constitutional: Negative for chills, diaphoresis, fatigue, fever, malaise/fatigue and weight loss.  HENT: Negative for ear discharge, ear pain and sore throat.   Eyes: Negative for blurred vision.  Respiratory: Negative for cough, sputum production, shortness of breath and wheezing.   Cardiovascular: Negative for chest pain, palpitations, leg swelling and near-syncope.  Gastrointestinal: Negative for abdominal pain, blood in stool, bowel incontinence, constipation, diarrhea, heartburn, melena, nausea and vomiting.  Genitourinary: Negative for bladder incontinence, dysuria, frequency, hematuria and urgency.  Musculoskeletal: Negative for  back pain, joint pain, myalgias and neck pain.  Skin: Negative for rash.  Neurological: Positive for dizziness and loss of balance. Negative for vertigo, tingling, sensory change, focal weakness, syncope, weakness, light-headedness and headaches.  Endo/Heme/Allergies: Negative for environmental allergies and polydipsia. Does not bruise/bleed easily.  Psychiatric/Behavioral: Positive for confusion and memory loss. Negative for depression and suicidal ideas. The patient is not nervous/anxious and does not have insomnia.      Objective  Vitals:   12/31/16 1506  BP: 130/70  Pulse: 64  Weight: 150 lb (68 kg)  Height: 5\' 7"  (1.702 m)    Physical Exam  Constitutional: She is well-developed, well-nourished, and in no distress. No distress.  HENT:  Head: Normocephalic and atraumatic.  Right Ear: External ear normal.  Left Ear: External ear normal.  Nose: Nose normal.  Mouth/Throat: Oropharynx is clear and moist.  Eyes: Conjunctivae and EOM are normal. Pupils are equal, round, and reactive to light. Right eye exhibits no discharge. Left eye exhibits no discharge.  Neck: Normal range of motion. Neck supple. No JVD present. No thyromegaly present.  Cardiovascular: Normal rate, regular rhythm, normal heart sounds and intact distal pulses.  Exam reveals no gallop and no friction rub.   No murmur heard. Pulmonary/Chest: Effort normal and breath sounds normal. She has no wheezes. She has no rales.  Abdominal: Soft. Bowel sounds are normal. She exhibits no mass. There is no tenderness. There is no guarding.  Musculoskeletal: Normal range of motion. She exhibits no edema.  Lymphadenopathy:    She has no cervical adenopathy.  Neurological: She is alert. She has normal reflexes.  Skin: Skin is warm and dry. She is not diaphoretic.  Psychiatric: Mood and affect normal.  Nursing note and vitals reviewed.     Assessment & Plan  Problem List Items Addressed This Visit      Cardiovascular and  Mediastinum   Cerebrovascular disease     Other   Ataxia - Primary   Relevant Orders   Ambulatory referral to Neurology   Memory loss   Relevant Orders   Ambulatory referral to Neurology   Recurrent major depressive disorder, in partial remission ([place])      No orders of the defined types were placed in this encounter.     Dr. [facility]  12/31/16

## 2017-01-03 ENCOUNTER — Other Ambulatory Visit: Payer: Self-pay

## 2017-01-03 DIAGNOSIS — M5441 Lumbago with sciatica, right side: Secondary | ICD-10-CM | POA: Diagnosis not present

## 2017-01-03 DIAGNOSIS — G8929 Other chronic pain: Secondary | ICD-10-CM | POA: Diagnosis not present

## 2017-01-03 DIAGNOSIS — R42 Dizziness and giddiness: Secondary | ICD-10-CM | POA: Diagnosis not present

## 2017-01-03 DIAGNOSIS — R413 Other amnesia: Secondary | ICD-10-CM | POA: Diagnosis not present

## 2017-01-03 DIAGNOSIS — R270 Ataxia, unspecified: Secondary | ICD-10-CM | POA: Diagnosis not present

## 2017-01-03 DIAGNOSIS — M5442 Lumbago with sciatica, left side: Secondary | ICD-10-CM | POA: Diagnosis not present

## 2017-01-29 ENCOUNTER — Other Ambulatory Visit: Payer: Self-pay | Admitting: *Deleted

## 2017-01-29 DIAGNOSIS — H903 Sensorineural hearing loss, bilateral: Secondary | ICD-10-CM | POA: Diagnosis not present

## 2017-01-29 MED ORDER — LETROZOLE 2.5 MG PO TABS: 2.5000 mg | ORAL_TABLET | Freq: Every day | ORAL | 1 refills | Status: DC

## 2017-02-11 ENCOUNTER — Ambulatory Visit: Payer: PPO | Admitting: Family Medicine

## 2017-02-18 ENCOUNTER — Ambulatory Visit: Payer: Self-pay

## 2017-02-21 ENCOUNTER — Telehealth: Payer: Self-pay

## 2017-02-21 NOTE — Telephone Encounter (Signed)
Called to r/s missed [facility] appt. Confirmed new appt with patient for 7/9 at 1115.

## 2017-02-25 ENCOUNTER — Ambulatory Visit (INDEPENDENT_AMBULATORY_CARE_PROVIDER_SITE_OTHER): Payer: PPO

## 2017-02-25 VITALS — BP 118/64 | HR 66 | Temp 98.5°F | Resp 16 | Ht 67.0 in | Wt 147.6 lb

## 2017-02-25 DIAGNOSIS — Z0000 Encounter for general adult medical examination without abnormal findings: Secondary | ICD-10-CM

## 2017-02-25 NOTE — Patient Instructions (Addendum)
Ms. Veronica Ellis , Thank you for taking time to come for your Medicare Wellness Visit. I appreciate your ongoing commitment to your health goals. Please review the following plan we discussed and let me know if I can assist you in the future.   Screening recommendations/referrals: Colonoscopy: completed 04/10/2011, no longer required Mammogram: completed 06/04/2016, no longer required Bone Density: completed 06/03/2015 Recommended yearly ophthalmology/optometry visit for glaucoma screening and checkup Recommended yearly dental visit for hygiene and checkup  Vaccinations: Influenza vaccine: due Pneumococcal vaccine: due  Tdap vaccine: due, check with your insurance company for coverage Shingles vaccine: due, check with your insurance company for coverage   Advanced directives: Please bring a copy at your convenience.   Conditions/risks identified: smoking cessation discussed  Next appointment: Follow up in one year for your annual wellness exam.    Preventive Care 65 Years and Older, Female Preventive care refers to lifestyle choices and visits with your health care provider that can promote health and wellness. What does preventive care include?  A yearly physical exam. This is also called an annual well check.  Dental exams once or twice a year.  Routine eye exams. Ask your health care provider how often you should have your eyes checked.  Personal lifestyle choices, including:  Daily care of your teeth and gums.  Regular physical activity.  Eating a healthy diet.  Avoiding tobacco and drug use.  Limiting alcohol use.  Practicing safe sex.  Taking low-dose aspirin every day.  Taking vitamin and mineral supplements as recommended by your health care provider. What happens during an annual well check? The services and screenings done by your health care provider during your annual well check will depend on your age, overall health, lifestyle risk factors, and family history  of disease. Counseling  Your health care provider may ask you questions about your:  Alcohol use.  Tobacco use.  Drug use.  Emotional well-being.  Home and relationship well-being.  Sexual activity.  Eating habits.  History of falls.  Memory and ability to understand (cognition).  Work and work Statistician.  Reproductive health. Screening  You may have the following tests or measurements:  Height, weight, and BMI.  Blood pressure.  Lipid and cholesterol levels. These may be checked every 5 years, or more frequently if you are over 35 years old.  Skin check.  Lung cancer screening. You may have this screening every year starting at age 23 if you have a 30-pack-year history of smoking and currently smoke or have quit within the past 15 years.  Fecal occult blood test (FOBT) of the stool. You may have this test every year starting at age 51.  Flexible sigmoidoscopy or colonoscopy. You may have a sigmoidoscopy every 5 years or a colonoscopy every 10 years starting at age 58.  Hepatitis C blood test.  Hepatitis B blood test.  Sexually transmitted disease (STD) testing.  Diabetes screening. This is done by checking your blood sugar (glucose) after you have not eaten for a while (fasting). You may have this done every 1-3 years.  Bone density scan. This is done to screen for osteoporosis. You may have this done starting at age 25.  Mammogram. This may be done every 1-2 years. Talk to your health care provider about how often you should have regular mammograms. Talk with your health care provider about your test results, treatment options, and if necessary, the need for more tests. Vaccines  Your health care provider may recommend certain vaccines, such as:  Influenza vaccine. This is recommended every year.  Tetanus, diphtheria, and acellular pertussis (Tdap, Td) vaccine. You may need a Td booster every 10 years.  Zoster vaccine. You may need this after age  7.  Pneumococcal 13-valent conjugate (PCV13) vaccine. One dose is recommended after age 54.  Pneumococcal polysaccharide (PPSV23) vaccine. One dose is recommended after age 41. Talk to your health care provider about which screenings and vaccines you need and how often you need them. This information is not intended to replace advice given to you by your health care provider. Make sure you discuss any questions you have with your health care provider. Document Released: 09/23/2015 Document Revised: 05/16/2016 Document Reviewed: 06/28/2015 Elsevier Interactive Patient Education  2017 [place] Prevention in the Home Falls can cause injuries. They can happen to people of all ages. There are many things you can do to make your home safe and to help prevent falls. What can I do on the outside of my home?  Regularly fix the edges of walkways and driveways and fix any cracks.  Remove anything that might make you trip as you walk through a door, such as a raised step or threshold.  Trim any bushes or trees on the path to your home.  Use bright outdoor lighting.  Clear any walking paths of anything that might make someone trip, such as rocks or tools.  Regularly check to see if handrails are loose or broken. Make sure that both sides of any steps have handrails.  Any raised decks and porches should have guardrails on the edges.  Have any leaves, snow, or ice cleared regularly.  Use sand or salt on walking paths during winter.  Clean up any spills in your garage right away. This includes oil or grease spills. What can I do in the bathroom?  Use night lights.  Install grab bars by the toilet and in the tub and shower. Do not use towel bars as grab bars.  Use non-skid mats or decals in the tub or shower.  If you need to sit down in the shower, use a plastic, non-slip stool.  Keep the floor dry. Clean up any water that spills on the floor as soon as it happens.  Remove  soap buildup in the tub or shower regularly.  Attach bath mats securely with double-sided non-slip rug tape.  Do not have throw rugs and other things on the floor that can make you trip. What can I do in the bedroom?  Use night lights.  Make sure that you have a light by your bed that is easy to reach.  Do not use any sheets or blankets that are too big for your bed. They should not hang down onto the floor.  Have a firm chair that has side arms. You can use this for support while you get dressed.  Do not have throw rugs and other things on the floor that can make you trip. What can I do in the kitchen?  Clean up any spills right away.  Avoid walking on wet floors.  Keep items that you use a lot in easy-to-reach places.  If you need to reach something above you, use a strong step stool that has a grab bar.  Keep electrical cords out of the way.  Do not use floor polish or wax that makes floors slippery. If you must use wax, use non-skid floor wax.  Do not have throw rugs and other things on the floor that  can make you trip. What can I do with my stairs?  Do not leave any items on the stairs.  Make sure that there are handrails on both sides of the stairs and use them. Fix handrails that are broken or loose. Make sure that handrails are as long as the stairways.  Check any carpeting to make sure that it is firmly attached to the stairs. Fix any carpet that is loose or worn.  Avoid having throw rugs at the top or bottom of the stairs. If you do have throw rugs, attach them to the floor with carpet tape.  Make sure that you have a light switch at the top of the stairs and the bottom of the stairs. If you do not have them, ask someone to add them for you. What else can I do to help prevent falls?  Wear shoes that:  Do not have high heels.  Have rubber bottoms.  Are comfortable and fit you well.  Are closed at the toe. Do not wear sandals.  If you use a  stepladder:  Make sure that it is fully opened. Do not climb a closed stepladder.  Make sure that both sides of the stepladder are locked into place.  Ask someone to hold it for you, if possible.  Clearly mark and make sure that you can see:  Any grab bars or handrails.  First and last steps.  Where the edge of each step is.  Use tools that help you move around (mobility aids) if they are needed. These include:  Canes.  Walkers.  Scooters.  Crutches.  Turn on the lights when you go into a dark area. Replace any light bulbs as soon as they burn out.  Set up your furniture so you have a clear path. Avoid moving your furniture around.  If any of your floors are uneven, fix them.  If there are any pets around you, be aware of where they are.  Review your medicines with your doctor. Some medicines can make you feel dizzy. This can increase your chance of falling. Ask your doctor what other things that you can do to help prevent falls. This information is not intended to replace advice given to you by your health care provider. Make sure you discuss any questions you have with your health care provider. Document Released: 06/23/2009 Document Revised: 02/02/2016 Document Reviewed: 10/01/2014 Elsevier Interactive Patient Education  2017 [employer].

## 2017-02-25 NOTE — Progress Notes (Signed)
Subjective:   Veronica Ellis is a 83 y.o. female who presents for Medicare Annual (Subsequent) preventive examination.  Review of Systems:   Cardiac Risk Factors include: advanced age (>12men, >40 women)     Objective:     Vitals: BP 118/64 (BP Location: Right Arm, Patient Position: Sitting)   Pulse 66   Temp 98.5 F (36.9 C)   Resp 16   Ht 5\' 7"  (1.702 m)   Wt 147 lb 9.6 oz (67 kg)   BMI 23.12 kg/m   Body mass index is 23.12 kg/m.   Tobacco History  Smoking Status  . Current Every Day Smoker  . Packs/day: 1.00  . Types: Cigarettes  Smokeless Tobacco  . Never Used     Ready to quit: No Counseling given: Yes   Past Medical History:  Diagnosis Date  . Anxiety   . Breast cancer ([place]) 2014   bilateral invasive mammary carcinoma. with 36 rad tx.   . Cancer ([place])    breast  . Depression   . Hyperlipemia   . Hypertension    Past Surgical History:  Procedure Laterality Date  . APPENDECTOMY    . BREAST BIOPSY Bilateral 2014  . BREAST SURGERY Bilateral   . COLON SURGERY     12 in. removed due to polyps  . TUBAL LIGATION     Family History  Problem Relation Age of Onset  . Breast cancer Daughter 40  . Breast cancer Daughter 44  . Cirrhosis Mother   . Lung cancer Father    History  Sexual Activity  . Sexual activity: Yes    Outpatient Encounter Prescriptions as of 02/25/2017  Medication Sig  . aspirin EC 81 MG tablet Take 81 mg by mouth daily.   . isosorbide mononitrate (IMDUR) 30 MG 24 hr tablet Take 1 tablet by mouth daily.  Marland Kitchen letrozole (FEMARA) 2.5 MG tablet Take 1 tablet (2.5 mg total) by mouth daily.  Marland Kitchen gabapentin (NEURONTIN) 100 MG capsule Take 200 mg by mouth 2 (two) times daily.  . mirtazapine (REMERON) 15 MG tablet TAKE 1/2 TAB (7.5MG ) AT NIGHT FOR 1 WEEK, THEN INCREASE TO 1 TABLET (15MG ).   Facility-Administered Encounter Medications as of 02/25/2017  Medication  . albuterol (PROVENTIL) (2.5 MG/3ML) 0.083% nebulizer solution 2.5 mg     Activities of Daily Living In your present state of health, do you have any difficulty performing the following activities: 02/25/2017  Hearing? Y  Vision? N  Difficulty concentrating or making decisions? N  Walking or climbing stairs? Y  Dressing or bathing? N  Doing errands, shopping? N  Preparing Food and eating ? N  Using the Toilet? N  In the past six months, have you accidently leaked urine? N  Do you have problems with loss of bowel control? Y  Managing your Medications? N  Managing your Finances? N  Housekeeping or managing your Housekeeping? N  Some recent data might be hidden    [name], MD as PCP - General (Family Medicine)    Assessment:     Exercise Activities and Dietary recommendations Current Exercise Habits: The patient does not participate in regular exercise at present, Exercise limited by: None identified  Goals    . Quit smoking / using tobacco          Smoking cessation discussed      Fall Risk Fall Risk  02/25/2017 02/13/2016 02/16/2015  Falls in the past year? Yes Yes Yes  Number falls  in past yr: 1 1 2  or more  Injury with Fall? Yes No Yes  Risk for fall due to : - - Impaired balance/gait  Follow up Falls prevention discussed - Falls prevention discussed;Falls evaluation completed   Depression Screen PHQ 2/9 Scores 02/25/2017 02/13/2016 02/16/2015  PHQ - 2 Score 0 0 0     Cognitive Function     6CIT Screen 02/25/2017  What Year? 0 points  What month? 0 points  What time? 0 points  Count back from 20 0 points  Months in reverse 0 points  Repeat phrase 0 points  Total Score 0     There is no immunization history on file for this patient. Screening Tests Health Maintenance  Topic Date Due  . PNA vac Low Risk Adult (1 of 2 - PCV13) 05/11/2017 (Originally 11/10/2000)  . TETANUS/TDAP  09/10/2017 (Originally 11/11/1952)  . INFLUENZA VACCINE  04/10/2017  . DEXA SCAN  Completed      Plan:    I have personally  reviewed and addressed the Medicare Annual Wellness questionnaire and have noted the following in the patient's chart:  A. Medical and social history B. Use of alcohol, tobacco or illicit drugs  C. Current medications and supplements D. Functional ability and status E.  Nutritional status F.  Physical activity G. Advance directives H. List of other physicians I.  Hospitalizations, surgeries, and ER visits in previous 12 months J.  [place] such as hearing and vision if needed, cognitive and depression L. Referrals and appointments  In addition, I have reviewed and discussed with patient certain preventive protocols, quality metrics, and best practice recommendations. A written personalized care plan for preventive services as well as general preventive health recommendations were provided to patient.   Signed,  Tyler Aas, LPN Nurse Health Advisor   MD Recommendations: Requests to have her pneumonia vaccine this fall.

## 2017-03-18 ENCOUNTER — Ambulatory Visit: Payer: Self-pay

## 2017-04-05 ENCOUNTER — Ambulatory Visit: Payer: PPO | Admitting: Oncology

## 2017-05-14 DIAGNOSIS — Z8739 Personal history of other diseases of the musculoskeletal system and connective tissue: Secondary | ICD-10-CM | POA: Insufficient documentation

## 2017-05-14 DIAGNOSIS — M5416 Radiculopathy, lumbar region: Secondary | ICD-10-CM | POA: Diagnosis not present

## 2017-05-14 DIAGNOSIS — R413 Other amnesia: Secondary | ICD-10-CM | POA: Diagnosis not present

## 2017-05-14 DIAGNOSIS — I10 Essential (primary) hypertension: Secondary | ICD-10-CM | POA: Diagnosis not present

## 2017-05-14 DIAGNOSIS — R270 Ataxia, unspecified: Secondary | ICD-10-CM | POA: Diagnosis not present

## 2017-05-16 DIAGNOSIS — M5416 Radiculopathy, lumbar region: Secondary | ICD-10-CM | POA: Insufficient documentation

## 2017-05-22 ENCOUNTER — Other Ambulatory Visit: Payer: Self-pay | Admitting: Neurology

## 2017-05-22 DIAGNOSIS — M5416 Radiculopathy, lumbar region: Secondary | ICD-10-CM

## 2017-05-22 DIAGNOSIS — M545 Low back pain: Secondary | ICD-10-CM

## 2017-05-29 ENCOUNTER — Ambulatory Visit
Admission: RE | Admit: 2017-05-29 | Discharge: 2017-05-29 | Disposition: A | Discharge status: Home / Self Care | Payer: PPO | Source: Ambulatory Visit | Attending: Neurology | Admitting: Neurology

## 2017-05-29 DIAGNOSIS — M5127 Other intervertebral disc displacement, lumbosacral region: Secondary | ICD-10-CM | POA: Insufficient documentation

## 2017-05-29 DIAGNOSIS — M5126 Other intervertebral disc displacement, lumbar region: Secondary | ICD-10-CM | POA: Diagnosis not present

## 2017-05-29 DIAGNOSIS — M48061 Spinal stenosis, lumbar region without neurogenic claudication: Secondary | ICD-10-CM | POA: Insufficient documentation

## 2017-05-29 DIAGNOSIS — M545 Low back pain: Secondary | ICD-10-CM

## 2017-05-29 DIAGNOSIS — M5416 Radiculopathy, lumbar region: Secondary | ICD-10-CM

## 2017-06-03 ENCOUNTER — Ambulatory Visit: Payer: Self-pay

## 2017-07-08 ENCOUNTER — Ambulatory Visit
Admission: RE | Admit: 2017-07-08 | Discharge: 2017-07-08 | Disposition: A | Discharge status: Home / Self Care | Payer: PPO | Source: Ambulatory Visit | Attending: Family Medicine | Admitting: Family Medicine

## 2017-07-08 ENCOUNTER — Encounter: Payer: Self-pay | Admitting: Family Medicine

## 2017-07-08 ENCOUNTER — Ambulatory Visit (INDEPENDENT_AMBULATORY_CARE_PROVIDER_SITE_OTHER): Payer: PPO | Admitting: Family Medicine

## 2017-07-08 VITALS — BP 100/58 | HR 80 | Ht 67.0 in | Wt 143.0 lb

## 2017-07-08 DIAGNOSIS — R05 Cough: Secondary | ICD-10-CM | POA: Diagnosis not present

## 2017-07-08 DIAGNOSIS — R079 Chest pain, unspecified: Secondary | ICD-10-CM | POA: Diagnosis not present

## 2017-07-08 DIAGNOSIS — J40 Bronchitis, not specified as acute or chronic: Secondary | ICD-10-CM | POA: Diagnosis not present

## 2017-07-08 DIAGNOSIS — R55 Syncope and collapse: Secondary | ICD-10-CM | POA: Diagnosis not present

## 2017-07-08 DIAGNOSIS — F17210 Nicotine dependence, cigarettes, uncomplicated: Secondary | ICD-10-CM | POA: Diagnosis not present

## 2017-07-08 DIAGNOSIS — R053 Chronic cough: Secondary | ICD-10-CM

## 2017-07-08 MED ORDER — DOXYCYCLINE HYCLATE 100 MG PO TABS: 100.0000 mg | ORAL_TABLET | Freq: Two times a day (BID) | ORAL | 0 refills | Status: DC

## 2017-07-08 NOTE — Progress Notes (Signed)
Name: Veronica Ellis   MRN: [id number]    DOB: 1933-12-15   Date:07/08/2017       Progress Note  Subjective  Chief Complaint  Chief Complaint  Patient presents with  . Follow-up    needs cataract surg, take melatonin otc for sleep and pt has had CT of head this year- normal    Cough  This is a new problem. The current episode started more than 1 year ago (forever). The problem has been gradually worsening. The cough is productive of purulent sputum (green). Associated symptoms include chest pain and hemoptysis. Pertinent negatives include no chills, ear congestion, ear pain, fever, headaches, heartburn, myalgias, nasal congestion, postnasal drip, rash, rhinorrhea, sore throat, shortness of breath, sweats, weight loss or wheezing. Risk factors for lung disease include smoking/tobacco exposure. She has tried OTC cough suppressant for the symptoms. The treatment provided moderate relief. Her past medical history is significant for asthma and COPD. There is no history of environmental allergies.  Chest Pain   This is a new problem. The current episode started in the past 7 days. The onset quality is gradual. The problem occurs intermittently. The pain is present in the substernal region. The pain does not radiate. Associated symptoms include hemoptysis. Pertinent negatives include no abdominal pain, back pain, cough, dizziness, fever, headaches, malaise/fatigue, nausea, palpitations, shortness of breath or sputum production.    No problem-specific Assessment & Plan notes found for this encounter.   Past Medical History:  Diagnosis Date  . Anxiety   . Breast cancer ([place]) 2014   bilateral invasive mammary carcinoma. with 36 rad tx.   . Cancer ([place])    breast  . Depression   . Hyperlipemia   . Hypertension     Past Surgical History:  Procedure Laterality Date  . APPENDECTOMY    . BREAST BIOPSY Bilateral 2014  . BREAST SURGERY Bilateral   . COLON SURGERY     12 in. removed due to  polyps  . TUBAL LIGATION      Family History  Problem Relation Age of Onset  . Breast cancer Daughter 87  . Breast cancer Daughter 70  . Cirrhosis Mother   . Lung cancer Father     Social History   Social History  . Marital status: Widowed    Spouse name: N/A  . Number of children: N/A  . Years of education: N/A   Occupational History  . Not on file.   Social History Main Topics  . Smoking status: Current Every Day Smoker    Packs/day: 1.00    Types: Cigarettes  . Smokeless tobacco: Never Used  . Alcohol use 0.0 oz/week     Comment: Occ  . Drug use: No  . Sexual activity: Yes   Other Topics Concern  . Not on file   Social History Narrative  . No narrative on file    Allergies  Allergen Reactions  . Erythromycin   . Iodine   . Penicillins   . Shellfish Allergy     Outpatient Medications Prior to Visit  Medication Sig Dispense Refill  . aspirin EC 81 MG tablet Take 81 mg by mouth daily.     Marland Kitchen gabapentin (NEURONTIN) 100 MG capsule Take 200 mg by mouth 2 (two) times daily.    Marland Kitchen letrozole (FEMARA) 2.5 MG tablet Take 1 tablet (2.5 mg total) by mouth daily. 90 tablet 1  . isosorbide mononitrate (IMDUR) 30 MG 24 hr tablet Take 1 tablet by mouth daily.    Marland Kitchen  mirtazapine (REMERON) 15 MG tablet TAKE 1/2 TAB (7.5MG ) AT NIGHT FOR 1 WEEK, THEN INCREASE TO 1 TABLET (15MG ).  3   Facility-Administered Medications Prior to Visit  Medication Dose Route Frequency Provider Last Rate Last Dose  . albuterol (PROVENTIL) (2.5 MG/3ML) 0.083% nebulizer solution 2.5 mg  2.5 mg Nebulization Once Juline Patch, MD        Review of Systems  Constitutional: Negative for chills, fever, malaise/fatigue and weight loss.  HENT: Negative for ear discharge, ear pain, postnasal drip, rhinorrhea and sore throat.   Eyes: Negative for blurred vision.  Respiratory: Positive for hemoptysis. Negative for cough, sputum production, shortness of breath and wheezing.   Cardiovascular: Positive for  chest pain. Negative for palpitations and leg swelling.  Gastrointestinal: Negative for abdominal pain, blood in stool, constipation, diarrhea, heartburn, melena and nausea.  Genitourinary: Negative for dysuria, frequency, hematuria and urgency.  Musculoskeletal: Negative for back pain, joint pain, myalgias and neck pain.  Skin: Negative for rash.  Neurological: Negative for dizziness, tingling, sensory change, focal weakness and headaches.  Endo/Heme/Allergies: Negative for environmental allergies and polydipsia. Does not bruise/bleed easily.  Psychiatric/Behavioral: Negative for depression and suicidal ideas. The patient is not nervous/anxious and does not have insomnia.      Objective  Vitals:   07/08/17 1356  BP: (!) 100/58  Pulse: 80  Weight: 143 lb (64.9 kg)  Height: 5\' 7"  (1.702 m)    Physical Exam  Constitutional: She is well-developed, well-nourished, and in no distress. No distress.  HENT:  Head: Normocephalic and atraumatic.  Right Ear: External ear normal.  Left Ear: External ear normal.  Nose: Nose normal.  Mouth/Throat: Oropharynx is clear and moist.  Eyes: Pupils are equal, round, and reactive to light. Conjunctivae and EOM are normal. Right eye exhibits no discharge. Left eye exhibits no discharge.  Neck: Normal range of motion. Neck supple. No JVD present. No thyromegaly present.  Cardiovascular: Normal rate, regular rhythm, normal heart sounds and intact distal pulses.  Exam reveals no gallop and no friction rub.   No murmur heard. Pulmonary/Chest: Effort normal and breath sounds normal. She has no wheezes. She has no rales.  Abdominal: Soft. Bowel sounds are normal. She exhibits no mass. There is no tenderness. There is no guarding.  Musculoskeletal: Normal range of motion. She exhibits no edema.  Lymphadenopathy:    She has no cervical adenopathy.  Neurological: She is alert. She has normal reflexes.  Skin: Skin is warm and dry. She is not diaphoretic.   Psychiatric: Mood and affect normal.  Nursing note and vitals reviewed.     Assessment & Plan  Problem List Items Addressed This Visit    None    Visit Diagnoses    Bronchitis    -  Primary   Relevant Medications   doxycycline (VIBRA-TABS) 100 MG tablet   Other Relevant Orders   DG Chest 2 View   Near syncope       Relevant Orders   EKG 12-Lead (Completed)   Ambulatory referral to Cardiology   Chest pain, unspecified type       Relevant Orders   EKG 12-Lead (Completed)   Ambulatory referral to Cardiology   Cigarette nicotine dependence without complication       Persistent cough for 3 weeks or longer          Meds ordered this encounter  Medications  . doxycycline (VIBRA-TABS) 100 MG tablet    Sig: Take 1 tablet (100 mg total) by mouth  2 (two) times daily.    Dispense:  20 tablet    Refill:  0      Dr. Macon [facility]  07/08/17

## 2017-07-10 DIAGNOSIS — I10 Essential (primary) hypertension: Secondary | ICD-10-CM | POA: Diagnosis not present

## 2017-07-10 DIAGNOSIS — I447 Left bundle-branch block, unspecified: Secondary | ICD-10-CM | POA: Diagnosis not present

## 2017-07-10 DIAGNOSIS — I25118 Atherosclerotic heart disease of native coronary artery with other forms of angina pectoris: Secondary | ICD-10-CM | POA: Diagnosis not present

## 2017-07-10 DIAGNOSIS — E7800 Pure hypercholesterolemia, unspecified: Secondary | ICD-10-CM | POA: Diagnosis not present

## 2017-07-22 ENCOUNTER — Encounter: Payer: Self-pay | Admitting: Family Medicine

## 2017-07-22 ENCOUNTER — Ambulatory Visit (INDEPENDENT_AMBULATORY_CARE_PROVIDER_SITE_OTHER): Payer: PPO | Admitting: Family Medicine

## 2017-07-22 VITALS — BP 124/70 | HR 60 | Ht 67.0 in | Wt 148.0 lb

## 2017-07-22 DIAGNOSIS — B373 Candidiasis of vulva and vagina: Secondary | ICD-10-CM | POA: Diagnosis not present

## 2017-07-22 DIAGNOSIS — S39012D Strain of muscle, fascia and tendon of lower back, subsequent encounter: Secondary | ICD-10-CM

## 2017-07-22 DIAGNOSIS — B3731 Acute candidiasis of vulva and vagina: Secondary | ICD-10-CM

## 2017-07-22 MED ORDER — FLUCONAZOLE 150 MG PO TABS: 150.0000 mg | ORAL_TABLET | Freq: Once | ORAL | 0 refills | Status: AC

## 2017-07-22 MED ORDER — CYCLOBENZAPRINE HCL 5 MG PO TABS: 5.0000 mg | ORAL_TABLET | Freq: Three times a day (TID) | ORAL | 1 refills | Status: DC | PRN

## 2017-07-22 NOTE — Progress Notes (Signed)
Name: Veronica Ellis   MRN: [id number]    DOB: [birth year]   Date:07/22/2017       Progress Note  Subjective  Chief Complaint  Chief Complaint  Patient presents with  . Follow-up    "I'm feeling much better these days"  . Vaginitis    needs diflucan after taking antibiotic    Vaginal Discharge  The patient's primary symptoms include genital itching and vaginal discharge. The patient's pertinent negatives include no genital lesions, genital odor, genital rash, missed menses, pelvic pain or vaginal bleeding. This is a new problem. The current episode started in the past 7 days. The problem occurs intermittently. The problem has been waxing and waning. The pain is mild. The problem affects both sides. Pertinent negatives include no abdominal pain, anorexia, back pain, chills, constipation, diarrhea, discolored urine, dysuria, fever, flank pain, frequency, headaches, hematuria, joint pain, joint swelling, nausea, painful intercourse, rash, sore throat, urgency or vomiting. The vaginal discharge was white. The vaginal bleeding is spotting.    No problem-specific Assessment & Plan notes found for this encounter.   Past Medical History:  Diagnosis Date  . Anxiety   . Breast cancer ([place]) 2014   bilateral invasive mammary carcinoma. with 36 rad tx.   . Cancer ([place])    breast  . Depression   . Hyperlipemia   . Hypertension     Past Surgical History:  Procedure Laterality Date  . APPENDECTOMY    . BREAST BIOPSY Bilateral 2014  . BREAST SURGERY Bilateral   . COLON SURGERY     12 in. removed due to polyps  . TUBAL LIGATION      Family History  Problem Relation Age of Onset  . Breast cancer Daughter 60  . Breast cancer Daughter 60  . Cirrhosis Mother   . Lung cancer Father     Social History   Socioeconomic History  . Marital status: Widowed    Spouse name: Not on file  . Number of children: Not on file  . Years of education: Not on file  . Highest education level: Not  on file  Social Needs  . Financial resource strain: Not on file  . Food insecurity - worry: Not on file  . Food insecurity - inability: Not on file  . Transportation needs - medical: Not on file  . Transportation needs - non-medical: Not on file  Occupational History  . Not on file  Tobacco Use  . Smoking status: Current Every Day Smoker    Packs/day: 1.00    Types: Cigarettes  . Smokeless tobacco: Never Used  Substance and Sexual Activity  . Alcohol use: Yes    Alcohol/week: 0.0 oz    Comment: Occ  . Drug use: No  . Sexual activity: Yes  Other Topics Concern  . Not on file  Social History Narrative  . Not on file    Allergies  Allergen Reactions  . Erythromycin   . Iodine   . Penicillins   . Shellfish Allergy     Outpatient Medications Prior to Visit  Medication Sig Dispense Refill  . aspirin EC 81 MG tablet Take 81 mg by mouth daily.     Marland Kitchen gabapentin (NEURONTIN) 100 MG capsule Take 200 mg by mouth 2 (two) times daily.    Marland Kitchen letrozole (FEMARA) 2.5 MG tablet Take 1 tablet (2.5 mg total) by mouth daily. 90 tablet 1  . mirtazapine (REMERON) 15 MG tablet TAKE 1/2 TAB (7.5MG ) AT NIGHT FOR 1 WEEK, THEN  INCREASE TO 1 TABLET (15MG ).  3  . isosorbide mononitrate (IMDUR) 30 MG 24 hr tablet Take 1 tablet by mouth daily.    Marland Kitchen doxycycline (VIBRA-TABS) 100 MG tablet Take 1 tablet (100 mg total) by mouth 2 (two) times daily. 20 tablet 0   Facility-Administered Medications Prior to Visit  Medication Dose Route Frequency Provider Last Rate Last Dose  . albuterol (PROVENTIL) (2.5 MG/3ML) 0.083% nebulizer solution 2.5 mg  2.5 mg Nebulization Once Juline Patch, MD        Review of Systems  Constitutional: Negative for chills, fever, malaise/fatigue and weight loss.  HENT: Negative for ear discharge, ear pain and sore throat.   Eyes: Negative for blurred vision.  Respiratory: Negative for cough, sputum production, shortness of breath and wheezing.   Cardiovascular: Negative for  chest pain, palpitations and leg swelling.  Gastrointestinal: Negative for abdominal pain, anorexia, blood in stool, constipation, diarrhea, heartburn, melena, nausea and vomiting.  Genitourinary: Positive for vaginal discharge. Negative for dysuria, flank pain, frequency, hematuria, missed menses, pelvic pain and urgency.  Musculoskeletal: Negative for back pain, joint pain, myalgias and neck pain.  Skin: Negative for rash.  Neurological: Negative for dizziness, tingling, sensory change, focal weakness and headaches.  Endo/Heme/Allergies: Negative for environmental allergies and polydipsia. Does not bruise/bleed easily.  Psychiatric/Behavioral: Negative for depression and suicidal ideas. The patient is not nervous/anxious and does not have insomnia.      Objective  Vitals:   07/22/17 1339  BP: 124/70  Pulse: 60  Weight: 148 lb (67.1 kg)  Height: 5\' 7"  (1.702 m)    Physical Exam  Constitutional: She is well-developed, well-nourished, and in no distress. No distress.  HENT:  Head: Normocephalic and atraumatic.  Right Ear: External ear normal.  Left Ear: External ear normal.  Nose: Nose normal.  Mouth/Throat: Oropharynx is clear and moist.  Eyes: Conjunctivae and EOM are normal. Pupils are equal, round, and reactive to light. Right eye exhibits no discharge. Left eye exhibits no discharge.  Neck: Normal range of motion. Neck supple. No JVD present. No thyromegaly present.  Cardiovascular: Normal rate, regular rhythm, normal heart sounds and intact distal pulses. Exam reveals no gallop and no friction rub.  No murmur heard. Pulmonary/Chest: Effort normal and breath sounds normal. She has no wheezes. She has no rales.  Abdominal: Soft. Bowel sounds are normal. She exhibits no mass. There is no tenderness. There is no guarding.  Musculoskeletal: Normal range of motion. She exhibits no edema.       Lumbar back: She exhibits spasm.  Lymphadenopathy:    She has no cervical adenopathy.   Neurological: She is alert.  Skin: Skin is warm and dry. She is not diaphoretic.  Psychiatric: Mood and affect normal.  Nursing note and vitals reviewed.     Assessment & Plan  Problem List Items Addressed This Visit    None    Visit Diagnoses    Candidiasis, vagina    -  Primary   Relevant Medications   fluconazole (DIFLUCAN) 150 MG tablet   Strain of lumbar region, subsequent encounter       Relevant Medications   cyclobenzaprine (FLEXERIL) 5 MG tablet      Meds ordered this encounter  Medications  . fluconazole (DIFLUCAN) 150 MG tablet    Sig: Take 1 tablet (150 mg total) once for 1 dose by mouth.    Dispense:  1 tablet    Refill:  0  . cyclobenzaprine (FLEXERIL) 5 MG tablet  Sig: Take 1 tablet (5 mg total) 3 (three) times daily as needed by mouth for muscle spasms.    Dispense:  30 tablet    Refill:  1      Dr. Mac[facility]  07/22/17

## 2017-07-25 DIAGNOSIS — I25118 Atherosclerotic heart disease of native coronary artery with other forms of angina pectoris: Secondary | ICD-10-CM | POA: Diagnosis not present

## 2017-07-25 DIAGNOSIS — I447 Left bundle-branch block, unspecified: Secondary | ICD-10-CM | POA: Diagnosis not present

## 2017-07-30 DIAGNOSIS — I5022 Chronic systolic (congestive) heart failure: Secondary | ICD-10-CM | POA: Insufficient documentation

## 2017-07-30 DIAGNOSIS — I10 Essential (primary) hypertension: Secondary | ICD-10-CM | POA: Diagnosis not present

## 2017-07-30 DIAGNOSIS — I5023 Acute on chronic systolic (congestive) heart failure: Secondary | ICD-10-CM | POA: Insufficient documentation

## 2017-07-30 DIAGNOSIS — I2510 Atherosclerotic heart disease of native coronary artery without angina pectoris: Secondary | ICD-10-CM | POA: Diagnosis not present

## 2017-09-14 ENCOUNTER — Other Ambulatory Visit: Payer: Self-pay | Admitting: Family Medicine

## 2017-09-14 DIAGNOSIS — F339 Major depressive disorder, recurrent, unspecified: Secondary | ICD-10-CM

## 2017-10-02 DIAGNOSIS — H2512 Age-related nuclear cataract, left eye: Secondary | ICD-10-CM | POA: Diagnosis not present

## 2017-11-06 DIAGNOSIS — M5136 Other intervertebral disc degeneration, lumbar region: Secondary | ICD-10-CM | POA: Diagnosis not present

## 2017-11-06 DIAGNOSIS — Z8739 Personal history of other diseases of the musculoskeletal system and connective tissue: Secondary | ICD-10-CM | POA: Diagnosis not present

## 2017-11-06 DIAGNOSIS — M5416 Radiculopathy, lumbar region: Secondary | ICD-10-CM | POA: Diagnosis not present

## 2017-11-09 ENCOUNTER — Other Ambulatory Visit: Payer: Self-pay | Admitting: Oncology

## 2017-11-13 DIAGNOSIS — H2511 Age-related nuclear cataract, right eye: Secondary | ICD-10-CM | POA: Diagnosis not present

## 2017-11-25 NOTE — Discharge Instructions (Signed)

## 2017-11-28 ENCOUNTER — Other Ambulatory Visit: Payer: Self-pay | Admitting: Oncology

## 2017-11-28 ENCOUNTER — Encounter: Payer: Self-pay | Admitting: Family Medicine

## 2017-11-28 ENCOUNTER — Ambulatory Visit (INDEPENDENT_AMBULATORY_CARE_PROVIDER_SITE_OTHER): Payer: PPO | Admitting: Family Medicine

## 2017-11-28 VITALS — BP 120/64 | HR 80 | Ht 67.0 in | Wt 139.0 lb

## 2017-11-28 DIAGNOSIS — J40 Bronchitis, not specified as acute or chronic: Secondary | ICD-10-CM

## 2017-11-28 DIAGNOSIS — F17210 Nicotine dependence, cigarettes, uncomplicated: Secondary | ICD-10-CM | POA: Diagnosis not present

## 2017-11-28 MED ORDER — BUPROPION HCL 100 MG PO TABS: 100.0000 mg | ORAL_TABLET | Freq: Two times a day (BID) | ORAL | 1 refills | Status: DC

## 2017-11-28 NOTE — Patient Instructions (Signed)
Bupropion sustained-release tablets (smoking cessation) What is this medicine? BUPROPION (byoo PROE pee on) is used to help people quit smoking. This medicine may be used for other purposes; ask your health care provider or pharmacist if you have questions. COMMON BRAND NAME(S): Buproban, Zyban What should I tell my health care provider before I take this medicine? They need to know if you have any of these conditions: -an eating disorder, such as anorexia or bulimia -bipolar disorder or psychosis -diabetes or high blood sugar, treated with medication -glaucoma -head injury or brain tumor -heart disease, previous heart attack, or irregular heart beat -high blood pressure -kidney or liver disease -seizures -suicidal thoughts or a previous suicide attempt -Tourette's syndrome -weight loss -an unusual or allergic reaction to bupropion, other medicines, foods, dyes, or preservatives -breast-feeding -pregnant or trying to become pregnant How should I use this medicine? Take this medicine by mouth with a glass of water. Follow the directions on the prescription label. You can take it with or without food. If it upsets your stomach, take it with food. Do not cut, crush or chew this medicine. Take your medicine at regular intervals. If you take this medicine more than once a day, take your second dose at least 8 hours after you take your first dose. To limit difficulty in sleeping, avoid taking this medicine at bedtime. Do not take your medicine more often than directed. Do not stop taking this medicine suddenly except upon the advice of your doctor. Stopping this medicine too quickly may cause serious side effects. A special MedGuide will be given to you by the pharmacist with each prescription and refill. Be sure to read this information carefully each time. Talk to your pediatrician regarding the use of this medicine in children. Special care may be needed. Overdosage: If you think you have  taken too much of this medicine contact a poison control center or emergency room at once. NOTE: This medicine is only for you. Do not share this medicine with others. What if I miss a dose? If you miss a dose, skip the missed dose and take your next tablet at the regular time. There should be at least 8 hours between doses. Do not take double or extra dose

## 2017-11-28 NOTE — Progress Notes (Signed)
Name: Veronica Ellis   MRN: [id number]    DOB: 06/23/1934   Date:11/28/2017       Progress Note  Subjective  Chief Complaint  Chief Complaint  Patient presents with  . Sinusitis    green production  . Nicotine Dependence    Cough  This is a new problem. The current episode started 1 to 4 weeks ago (2 weeks). The problem has been waxing and waning. The problem occurs every few minutes. The cough is productive of purulent sputum (green). Associated symptoms include wheezing. Pertinent negatives include no chest pain, chills, ear congestion, ear pain, fever, headaches, heartburn, hemoptysis, myalgias, nasal congestion, postnasal drip, rash, rhinorrhea, sore throat, shortness of breath, sweats or weight loss. The symptoms are aggravated by cold air, lying down and pollens. There is no history of environmental allergies.  Nicotine Dependence  Presents for follow-up visit. Symptoms are negative for insomnia and sore throat. Her urge triggers include company of smokers. The symptoms have been stable. Her first smoke is from 8 to 10 AM. She smokes 1 pack of cigarettes per day. Compliance with prior treatments has been poor.    No problem-specific Assessment & Plan notes found for this encounter.   Past Medical History:  Diagnosis Date  . Anxiety   . Anxiety   . Arthritis   . Breast cancer ([place]) 2014   bilateral invasive mammary carcinoma. with 36 rad tx.   . Breast cancer ([place])   . Cancer ([place])    breast  . Depression   . Depression   . Hard of hearing   . Hyperlipemia   . Hypertension   . IBS (irritable bowel syndrome)   . Memory loss   . Osteoporosis     Past Surgical History:  Procedure Laterality Date  . APPENDECTOMY    . BREAST BIOPSY Bilateral 2014  . BREAST SURGERY Bilateral   . COLON SURGERY     12 in. removed due to polyps  . TUBAL LIGATION      Family History  Problem Relation Age of Onset  . Breast cancer Daughter 17  . Breast cancer Daughter 48  .  Cirrhosis Mother   . Lung cancer Father     Social History   Socioeconomic History  . Marital status: Widowed    Spouse name: Not on file  . Number of children: Not on file  . Years of education: Not on file  . Highest education level: Not on file  Occupational History  . Not on file  Social Needs  . Financial resource strain: Not on file  . Food insecurity:    Worry: Not on file    Inability: Not on file  . Transportation needs:    Medical: Not on file    Non-medical: Not on file  Tobacco Use  . Smoking status: Current Every Day Smoker    Packs/day: 1.00    Types: Cigarettes  . Smokeless tobacco: Never Used  Substance and Sexual Activity  . Alcohol use: Yes    Alcohol/week: 0.0 oz    Comment: Occ  . Drug use: No  . Sexual activity: Yes  Lifestyle  . Physical activity:    Days per week: Not on file    Minutes per session: Not on file  . Stress: Not on file  Relationships  . Social connections:    Talks on phone: Not on file    Gets together: Not on file    Attends religious service: Not on file  Active member of club or organization: Not on file    Attends meetings of clubs or organizations: Not on file    Relationship status: Not on file  . Intimate partner violence:    Fear of current or ex partner: Not on file    Emotionally abused: Not on file    Physically abused: Not on file    Forced sexual activity: Not on file  Other Topics Concern  . Not on file  Social History Narrative  . Not on file    Allergies  Allergen Reactions  . Erythromycin   . Iodine     Betadine okay   . Penicillins   . Shellfish Allergy     Outpatient Medications Prior to Visit  Medication Sig Dispense Refill  . aspirin EC 81 MG tablet Take 81 mg by mouth daily.     . Calcium Carbonate-Vit D-Min (CALCIUM 1200 PO) Take 600 mg by mouth daily.    . Cholecalciferol (VITAMIN D3) 10000 units TABS Take by mouth.    . gabapentin (NEURONTIN) 100 MG capsule Take 300 mg by mouth 3  (three) times daily. neurology    . isosorbide mononitrate (IMDUR) 30 MG 24 hr tablet Take 1 tablet by mouth daily.    Marland Kitchen letrozole (FEMARA) 2.5 MG tablet Take 1 tablet (2.5 mg total) by mouth daily. 90 tablet 1  . Probiotic Product (HEALTHY COLON PO) Take by mouth at bedtime.    . vitamin B-12 (CYANOCOBALAMIN) 100 MCG tablet Take 100 mcg by mouth daily.    . cyclobenzaprine (FLEXERIL) 5 MG tablet Take 1 tablet (5 mg total) 3 (three) times daily as needed by mouth for muscle spasms. (Patient not taking: Reported on 11/21/2017) 30 tablet 1  . mirtazapine (REMERON) 15 MG tablet TAKE 1/2 TAB (7.5MG ) AT NIGHT FOR 1 WEEK, THEN INCREASE TO 1 TABLET (15MG ).  3  . sertraline (ZOLOFT) 100 MG tablet TAKE 1 TABLET (100 MG TOTAL) BY MOUTH DAILY. (Patient not taking: Reported on 11/21/2017) 30 tablet 3   Facility-Administered Medications Prior to Visit  Medication Dose Route Frequency Provider Last Rate Last Dose  . albuterol (PROVENTIL) (2.5 MG/3ML) 0.083% nebulizer solution 2.5 mg  2.5 mg Nebulization Once Juline Patch, MD        Review of Systems  Constitutional: Negative for chills, fever, malaise/fatigue and weight loss.  HENT: Negative for ear discharge, ear pain, postnasal drip, rhinorrhea and sore throat.   Eyes: Negative for blurred vision.  Respiratory: Positive for wheezing. Negative for cough, hemoptysis, sputum production and shortness of breath.   Cardiovascular: Negative for chest pain, palpitations and leg swelling.  Gastrointestinal: Negative for abdominal pain, blood in stool, constipation, diarrhea, heartburn, melena and nausea.  Genitourinary: Negative for dysuria, frequency, hematuria and urgency.  Musculoskeletal: Negative for back pain, joint pain, myalgias and neck pain.  Skin: Negative for rash.  Neurological: Negative for dizziness, tingling, sensory change, focal weakness and headaches.  Endo/Heme/Allergies: Negative for environmental allergies and polydipsia. Does not  bruise/bleed easily.  Psychiatric/Behavioral: Negative for depression and suicidal ideas. The patient is not nervous/anxious and does not have insomnia.      Objective  Vitals:   11/28/17 1606  BP: 120/64  Pulse: 80  Weight: 139 lb (63 kg)  Height: 5\' 7"  (1.702 m)    Physical Exam  Constitutional: She is well-developed, well-nourished, and in no distress. No distress.  HENT:  Head: Normocephalic and atraumatic.  Right Ear: External ear normal.  Left Ear: External ear normal.  Nose: Nose normal.  Mouth/Throat: Oropharynx is clear and moist.  Eyes: Pupils are equal, round, and reactive to light. Conjunctivae and EOM are normal. Right eye exhibits no discharge. Left eye exhibits no discharge.  Neck: Normal range of motion. Neck supple. No JVD present. No thyromegaly present.  Cardiovascular: Normal rate, regular rhythm, normal heart sounds and intact distal pulses. Exam reveals no gallop and no friction rub.  No murmur heard. Pulmonary/Chest: Effort normal and breath sounds normal. She has no wheezes. She has no rales.  Abdominal: Soft. Bowel sounds are normal. She exhibits no mass. There is no tenderness. There is no guarding.  Musculoskeletal: Normal range of motion. She exhibits no edema.  Lymphadenopathy:    She has no cervical adenopathy.  Neurological: She is alert. She has normal reflexes.  Skin: Skin is warm and dry. She is not diaphoretic.  Psychiatric: Mood and affect normal.  Nursing note and vitals reviewed.     Assessment & Plan  Problem List Items Addressed This Visit    None    Visit Diagnoses    Cigarette nicotine dependence without complication    -  Primary   Relevant Medications   buPROPion (WELLBUTRIN) 100 MG tablet   Bronchitis          Meds ordered this encounter  Medications  . buPROPion (WELLBUTRIN) 100 MG tablet    Sig: Take 1 tablet (100 mg total) by mouth 2 (two) times daily.    Dispense:  60 tablet    Refill:  1      Dr. Otilio Miu [facility]  11/28/17

## 2017-12-03 ENCOUNTER — Encounter: Payer: Self-pay | Admitting: *Deleted

## 2017-12-03 ENCOUNTER — Other Ambulatory Visit: Payer: Self-pay

## 2017-12-11 ENCOUNTER — Encounter: Payer: Self-pay | Admitting: Anesthesiology

## 2017-12-11 ENCOUNTER — Telehealth: Payer: Self-pay

## 2017-12-11 ENCOUNTER — Other Ambulatory Visit: Payer: Self-pay

## 2017-12-11 ENCOUNTER — Ambulatory Visit
Admission: RE | Admit: 2017-12-11 | Discharge: 2017-12-11 | Disposition: A | Discharge status: Home / Self Care | Payer: PPO | Source: Ambulatory Visit | Attending: Ophthalmology | Admitting: Ophthalmology

## 2017-12-11 ENCOUNTER — Encounter
Admission: RE | Disposition: A | Discharge status: Home / Self Care | Payer: Self-pay | Source: Ambulatory Visit | Attending: Ophthalmology

## 2017-12-11 DIAGNOSIS — Z91048 Other nonmedicinal substance allergy status: Secondary | ICD-10-CM | POA: Insufficient documentation

## 2017-12-11 DIAGNOSIS — Z91018 Allergy to other foods: Secondary | ICD-10-CM | POA: Diagnosis not present

## 2017-12-11 DIAGNOSIS — I10 Essential (primary) hypertension: Secondary | ICD-10-CM | POA: Insufficient documentation

## 2017-12-11 DIAGNOSIS — H2511 Age-related nuclear cataract, right eye: Secondary | ICD-10-CM | POA: Insufficient documentation

## 2017-12-11 DIAGNOSIS — Z888 Allergy status to other drugs, medicaments and biological substances status: Secondary | ICD-10-CM | POA: Diagnosis not present

## 2017-12-11 DIAGNOSIS — F329 Major depressive disorder, single episode, unspecified: Secondary | ICD-10-CM | POA: Insufficient documentation

## 2017-12-11 DIAGNOSIS — H5703 Miosis: Secondary | ICD-10-CM | POA: Insufficient documentation

## 2017-12-11 DIAGNOSIS — F17200 Nicotine dependence, unspecified, uncomplicated: Secondary | ICD-10-CM | POA: Diagnosis not present

## 2017-12-11 DIAGNOSIS — Z79899 Other long term (current) drug therapy: Secondary | ICD-10-CM | POA: Diagnosis not present

## 2017-12-11 DIAGNOSIS — H25811 Combined forms of age-related cataract, right eye: Secondary | ICD-10-CM | POA: Diagnosis not present

## 2017-12-11 HISTORY — DX: Unspecified hearing loss, unspecified ear: H91.90

## 2017-12-11 HISTORY — DX: Other amnesia: R41.3

## 2017-12-11 HISTORY — DX: Unspecified osteoarthritis, unspecified site: M19.90

## 2017-12-11 HISTORY — DX: Irritable bowel syndrome, unspecified: K58.9

## 2017-12-11 HISTORY — DX: Age-related osteoporosis without current pathological fracture: M81.0

## 2017-12-11 HISTORY — PX: CATARACT EXTRACTION W/PHACO: SHX586

## 2017-12-11 HISTORY — DX: Presence of dental prosthetic device (complete) (partial): Z97.2

## 2017-12-11 SURGERY — PHACOEMULSIFICATION, CATARACT, WITH IOL INSERTION
Anesthesia: Monitor Anesthesia Care | Site: Eye | Laterality: Right | Wound class: "Clean "

## 2017-12-11 MED ORDER — ARMC OPHTHALMIC DILATING DROPS
1.0000 "application " | OPHTHALMIC | Status: DC | PRN
  Administered 2017-12-11 (×3): 1 via OPHTHALMIC

## 2017-12-11 MED ORDER — EPINEPHRINE PF 1 MG/ML IJ SOLN
INTRAOCULAR | Status: DC | PRN
  Administered 2017-12-11: 89 mL via OPHTHALMIC

## 2017-12-11 MED ORDER — ACETAMINOPHEN 325 MG PO TABS: 325.0000 mg | ORAL_TABLET | Freq: Once | ORAL | Status: DC

## 2017-12-11 MED ORDER — FENTANYL CITRATE (PF) 100 MCG/2ML IJ SOLN
INTRAMUSCULAR | Status: DC | PRN
  Administered 2017-12-11: 50 ug via INTRAVENOUS

## 2017-12-11 MED ORDER — MOXIFLOXACIN HCL 0.5 % OP SOLN
1.0000 [drp] | OPHTHALMIC | Status: DC | PRN
  Administered 2017-12-11 (×3): 1 [drp] via OPHTHALMIC

## 2017-12-11 MED ORDER — MIDAZOLAM HCL 2 MG/2ML IJ SOLN
INTRAMUSCULAR | Status: DC | PRN
  Administered 2017-12-11: 1 mg via INTRAVENOUS

## 2017-12-11 MED ORDER — NA HYALUR & NA CHOND-NA HYALUR 0.4-0.35 ML IO KIT
PACK | INTRAOCULAR | Status: DC | PRN
  Administered 2017-12-11: 1 mL via INTRAOCULAR

## 2017-12-11 MED ORDER — LIDOCAINE HCL (PF) 2 % IJ SOLN
INTRAOCULAR | Status: DC | PRN
  Administered 2017-12-11: 1 mL

## 2017-12-11 MED ORDER — BRIMONIDINE TARTRATE-TIMOLOL 0.2-0.5 % OP SOLN
OPHTHALMIC | Status: DC | PRN
  Administered 2017-12-11: 1 [drp] via OPHTHALMIC

## 2017-12-11 MED ORDER — ACETAMINOPHEN 160 MG/5ML PO SOLN: 325.0000 mg | Freq: Once | ORAL | Status: DC

## 2017-12-11 MED ORDER — LACTATED RINGERS IV SOLN: INTRAVENOUS | Status: DC

## 2017-12-11 MED ORDER — MOXIFLOXACIN HCL 0.5 % OP SOLN
OPHTHALMIC | Status: DC | PRN
  Administered 2017-12-11: 0.2 mL via OPHTHALMIC

## 2017-12-11 SURGICAL SUPPLY — 21 items
CANNULA ANT/CHMB 27G (MISCELLANEOUS) ×1 IMPLANT
CANNULA ANT/CHMB 27GA (MISCELLANEOUS) ×3 IMPLANT
GLOVE SURG LX 7.5 STRW (GLOVE) ×2
GLOVE SURG LX STRL 7.5 STRW (GLOVE) ×1 IMPLANT
GLOVE SURG TRIUMPH 8.0 PF LTX (GLOVE) ×3 IMPLANT
GOWN STRL REUS W/ TWL LRG LVL3 (GOWN DISPOSABLE) ×2 IMPLANT
GOWN STRL REUS W/TWL LRG LVL3 (GOWN DISPOSABLE) ×4
LENS IOL ACRYSOF IQ 20.0 (Intraocular Lens) ×2 IMPLANT
MARKER SKIN DUAL TIP RULER LAB (MISCELLANEOUS) ×3 IMPLANT
NDL FILTER BLUNT 18X1 1/2 (NEEDLE) ×1 IMPLANT
NEEDLE FILTER BLUNT 18X 1/2SAF (NEEDLE) ×2
NEEDLE FILTER BLUNT 18X1 1/2 (NEEDLE) ×1 IMPLANT
PACK CATARACT BRASINGTON (MISCELLANEOUS) ×3 IMPLANT
PACK EYE AFTER SURG (MISCELLANEOUS) ×3 IMPLANT
PACK OPTHALMIC (MISCELLANEOUS) ×3 IMPLANT
RING MALYGIN 7.0 (MISCELLANEOUS) ×2 IMPLANT
SYR 3ML LL SCALE MARK (SYRINGE) ×3 IMPLANT
SYR 5ML LL (SYRINGE) ×3 IMPLANT
SYR TB 1ML LUER SLIP (SYRINGE) ×3 IMPLANT
WATER STERILE IRR 500ML POUR (IV SOLUTION) ×3 IMPLANT
WIPE NON LINTING 3.25X3.25 (MISCELLANEOUS) ×3 IMPLANT

## 2017-12-11 NOTE — Anesthesia Postprocedure Evaluation (Signed)
Anesthesia Post Note  Patient: Veronica Ellis  Procedure(s) Performed: CATARACT EXTRACTION PHACO AND INTRAOCULAR LENS PLACEMENT (IOC) COMPLICATED RIGHT (Right Eye)  Patient location during evaluation: PACU Anesthesia Type: MAC Level of consciousness: awake and alert Pain management: pain level controlled Vital Signs Assessment: post-procedure vital signs reviewed and stable Respiratory status: spontaneous breathing, nonlabored ventilation, respiratory function stable and patient connected to nasal cannula oxygen Cardiovascular status: stable and blood pressure returned to baseline Postop Assessment: no apparent nausea or vomiting Anesthetic complications: no    [name]

## 2017-12-11 NOTE — Op Note (Signed)
OPERATIVE NOTE  Veronica Ellis [id number] 12/11/2017   PREOPERATIVE DIAGNOSIS:    Nuclear Sclerotic Cataract Right eye with miotic pupil.        H25.11  POSTOPERATIVE DIAGNOSIS: Nuclear Sclerotic Cataract Right eye with miotic pupil.          PROCEDURE:  Phacoemusification with posterior chamber intraocular lens placement of the right eye which required pupil stretching with the Malyugin pupil expansion device.  LENS:   Implant Name Type Inv. Item Serial No. Manufacturer Lot No. LRB No. Used  LENS IOL ACRYSOF IQ 20.0 - [id number] Intraocular Lens LENS IOL ACRYSOF IQ 20.0 [id number] ALCON  Right 1       ULTRASOUND TIME: 26 % of 1 minutes 46 seconds, CDE 27.7  SURGEON:  Wyonia Hough, MD   ANESTHESIA:  Topical with tetracaine drops and 2% Xylocaine jelly, augmented with 1% preservative-free intracameral lidocaine.   COMPLICATIONS:  None.   DESCRIPTION OF PROCEDURE:  The patient was identified in the holding room and transported to the operating room and placed in the supine position under the operating microscope. Theright eye was identified as the operative eye and it was prepped and draped in the usual sterile ophthalmic fashion.   A 1 millimeter clear-corneal paracentesis was made at the 12:00 position.  0.5 ml of preservative-free 1% lidocaine was injected into the anterior chamber. The anterior chamber was filled with Viscoat viscoelastic.  A 2.4 millimeter keratome was used to make a near-clear corneal incision at the 9:00 position. A Malyugin pupil expander was then placed through the main incision and into the anterior chamber of the eye.  The edge of the iris was secured on the lip of the pupil expander and it was released, thereby expanding the pupil to approximately 7 millimeters for completion of the cataract surgery.  Additional Viscoat was placed in the anterior chamber.  A cystotome and capsulorrhexis forceps were used to make a curvilinear capsulorrhexis.    Balanced salt solution was used to hydrodissect and hydrodelineate the lens nucleus.   Phacoemulsification was used in stop and chop fashion to remove the lens, nucleus and epinucleus.  The remaining cortex was aspirated using the irrigation aspiration handpiece.  Additional Provisc was placed into the eye to distend the capsular bag for lens placement.  A lens was then injected into the capsular bag.  The pupil expanding ring was removed using a Kuglen hook and insertion device. The remaining viscoelastic was aspirated from the capsular bag and the anterior chamber.  The anterior chamber was filled with balanced salt solution to inflate to a physiologic pressure.  Wounds were hydrated with balanced salt solution.  The anterior chamber was inflated to a physiologic pressure with balanced salt solution.  No wound leaks were noted.Vigamox 0.2 ml of a 1mg  per ml solution was injected into the anterior chamber for a dose of 0.2 mg of intracameral antibiotic at the completion of the case. Timolol and Brimonidine drops were applied to the eye.  The patient was taken to the recovery room in stable condition without complications of anesthesia or surgery.  [name] 12/11/2017, 10:32 AM

## 2017-12-11 NOTE — H&P (Signed)
The History and Physical notes are on paper, have been signed, and are to be scanned. The patient remains stable and unchanged from the H&P.   Previous H&P reviewed, patient examined, and there are no changes.  Veronica Ellis 12/11/2017 9:58 AM

## 2017-12-11 NOTE — Anesthesia Procedure Notes (Signed)
Procedure Name: MAC Performed by: Braulio Kiedrowski, CRNA Pre-anesthesia Checklist: Emergency Drugs available, Suction available, Patient being monitored and Timeout performed Patient Re-evaluated:Patient Re-evaluated prior to induction Oxygen Delivery Method: Nasal cannula       

## 2017-12-11 NOTE — Telephone Encounter (Signed)
Pt's son called and stated that he didn't know how to give pt her eye drops ( what directions) and when to start them. I had to leave a message on pt's home phone because the son did not leave a phone number to return call to. I left the telephone number to Dr Larrie Kass office and directed them to call his office.

## 2017-12-11 NOTE — Transfer of Care (Signed)
Immediate Anesthesia Transfer of Care Note  Patient: Veronica Ellis  Procedure(s) Performed: CATARACT EXTRACTION PHACO AND INTRAOCULAR LENS PLACEMENT (IOC) COMPLICATED RIGHT (Right Eye)  Patient Location: PACU  Anesthesia Type: MAC  Level of Consciousness: awake, alert  and patient cooperative  Airway and Oxygen Therapy: Patient Spontanous Breathing and Patient connected to supplemental oxygen  Post-op Assessment: Post-op Vital signs reviewed, Patient's Cardiovascular Status Stable, Respiratory Function Stable, Patent Airway and No signs of Nausea or vomiting  Post-op Vital Signs: Reviewed and stable  Complications: No apparent anesthesia complications

## 2017-12-11 NOTE — Anesthesia Preprocedure Evaluation (Addendum)
Anesthesia Evaluation  Patient identified by MRN, date of birth, ID band  Reviewed: NPO status   History of Anesthesia Complications Negative for: history of anesthetic complications  Airway Mallampati: II  TM Distance: >3 FB Neck ROM: full    Dental  (+) Upper Dentures, Lower Dentures   Pulmonary COPD (mild), Current Smoker,    Pulmonary exam normal        Cardiovascular Exercise Tolerance: Good hypertension, +CHF (ef=30%)  Normal cardiovascular exam   stress: 2018: LVEF= 40%  FINDINGS: Regional wall motion:demonstrateshypokinesis of the Entire myocardium. The overall quality of the study is fair. Artifacts noted: no Left ventricular cavity: enlarged.  Perfusion Analysis:SPECT images demonstrate small perfusion abnormality  of mild intensity is present in the inferior and apical myocardial region  on the stress images.There is normal myocardial perfusion and all  myocardial walls consistent with possible myocardial ischemia in the  inferior base and apex versus some diaphragmatic attenuation;  echO: 2018: MODERATE LV SYSTOLIC DYSFUNCTION (See above) WITH MODERATE LVH NORMAL RIGHT VENTRICULAR SYSTOLIC FUNCTION MILD VALVULAR REGURGITATION (See above) NO VALVULAR STENOSIS MILD MR, TR, PR EF 25-30%;    chronic severe systolic heart failure secondary to Non-ischemic.   cards stable: nov 2018: dr. Nehemiah Massed;     Neuro/Psych PSYCHIATRIC DISORDERS Anxiety Bipolar Disorder Memory loss ? TIA (2016)   GI/Hepatic Neg liver ROS, GERD  Controlled,IBS   Endo/Other  negative endocrine ROS  Renal/GU negative Renal ROS  negative genitourinary   Musculoskeletal  (+) Arthritis , Back pain   Abdominal   Peds  Hematology B Breast cancer > lumpectomy 2014;     Anesthesia Other Findings   Reproductive/Obstetrics                            Anesthesia Physical Anesthesia  Plan  ASA: III  Anesthesia Plan: MAC   Post-op Pain Management:    Induction:   PONV Risk Score and Plan:   Airway Management Planned:   Additional Equipment:   Intra-op Plan:   Post-operative Plan:   Informed Consent: I have reviewed the patients History and Physical, chart, labs and discussed the procedure including the risks, benefits and alternatives for the proposed anesthesia with the patient or authorized representative who has indicated his/her understanding and acceptance.     Plan Discussed with: CRNA  Anesthesia Plan Comments:        Anesthesia Quick Evaluation

## 2017-12-12 ENCOUNTER — Encounter: Payer: Self-pay | Admitting: Ophthalmology

## 2017-12-26 ENCOUNTER — Ambulatory Visit (INDEPENDENT_AMBULATORY_CARE_PROVIDER_SITE_OTHER): Payer: PPO | Admitting: Family Medicine

## 2017-12-26 ENCOUNTER — Encounter: Payer: Self-pay | Admitting: Family Medicine

## 2017-12-26 ENCOUNTER — Ambulatory Visit: Payer: Self-pay | Admitting: Family Medicine

## 2017-12-26 VITALS — BP 134/70 | HR 72 | Ht 67.0 in | Wt 143.0 lb

## 2017-12-26 DIAGNOSIS — F17210 Nicotine dependence, cigarettes, uncomplicated: Secondary | ICD-10-CM | POA: Diagnosis not present

## 2017-12-26 DIAGNOSIS — J432 Centrilobular emphysema: Secondary | ICD-10-CM

## 2017-12-26 DIAGNOSIS — I5022 Chronic systolic (congestive) heart failure: Secondary | ICD-10-CM | POA: Diagnosis not present

## 2017-12-26 DIAGNOSIS — F3341 Major depressive disorder, recurrent, in partial remission: Secondary | ICD-10-CM

## 2017-12-26 DIAGNOSIS — I2510 Atherosclerotic heart disease of native coronary artery without angina pectoris: Secondary | ICD-10-CM | POA: Diagnosis not present

## 2017-12-26 MED ORDER — BUPROPION HCL 100 MG PO TABS: 100.0000 mg | ORAL_TABLET | Freq: Two times a day (BID) | ORAL | 6 refills | Status: DC

## 2017-12-26 NOTE — Progress Notes (Signed)
Name: Veronica Ellis   MRN: [id number]    DOB: 1934-08-07   Date:12/26/2017       Progress Note  Subjective  Chief Complaint  Chief Complaint  Patient presents with  . Follow-up    started buproprion for smoking cessation    Depression       The patient presents with depression.  This is a recurrent problem.  The current episode started more than 1 month ago.   The onset quality is gradual.   The problem occurs intermittently.  The problem has been gradually improving since onset.  Associated symptoms include no decreased concentration, no fatigue, no helplessness, no hopelessness, does not have insomnia, not irritable, no restlessness, no decreased interest, no appetite change, no body aches, no myalgias, no headaches, no indigestion, not sad and no suicidal ideas.( Improved in all aspects)  Treatments tried: bupropion.  Compliance with treatment is good.  Previous treatment provided moderate relief.  Past medical history includes depression.     Pertinent negatives include no suicide attempts. Congestive Heart Failure  Presents for follow-up visit. Associated symptoms include nocturia, orthopnea, paroxysmal nocturnal dyspnea, shortness of breath and unexpected weight change. Pertinent negatives include no abdominal pain, chest pain, chest pressure, claudication, edema, fatigue, muscle weakness, near-syncope or palpitations. Symptom course: waxing and waning.    No problem-specific Assessment & Plan notes found for this encounter.   Past Medical History:  Diagnosis Date  . Anxiety   . Anxiety   . Arthritis   . Breast cancer ([place]) 2014   bilateral invasive mammary carcinoma. with 36 rad tx.   . Breast cancer ([place])   . Cancer ([place])    breast  . Depression   . Depression   . Hard of hearing   . Hyperlipemia   . Hypertension   . IBS (irritable bowel syndrome)   . Memory loss   . Osteoporosis   . Wears dentures    full upper, partial lower    Past Surgical History:   Procedure Laterality Date  . APPENDECTOMY    . BREAST BIOPSY Bilateral 2014  . BREAST SURGERY Bilateral   . CATARACT EXTRACTION W/PHACO Right 12/11/2017   Procedure: CATARACT EXTRACTION PHACO AND INTRAOCULAR LENS PLACEMENT ([place]) COMPLICATED RIGHT;  Surgeon: Leandrew Koyanagi, MD;  Location: [facility];  Service: Ophthalmology;  Laterality: Right;  . COLON SURGERY     12 in. removed due to polyps  . TUBAL LIGATION      Family History  Problem Relation Age of Onset  . Breast cancer Daughter 47  . Breast cancer Daughter 32  . Cirrhosis Mother   . Lung cancer Father     Social History   Socioeconomic History  . Marital status: Widowed    Spouse name: Not on file  . Number of children: Not on file  . Years of education: Not on file  . Highest education level: Not on file  Occupational History  . Not on file  Social Needs  . Financial resource strain: Not on file  . Food insecurity:    Worry: Not on file    Inability: Not on file  . Transportation needs:    Medical: Not on file    Non-medical: Not on file  Tobacco Use  . Smoking status: Former Smoker    Packs/day: 1.00    Types: Cigarettes  . Smokeless tobacco: Never Used  Substance and Sexual Activity  . Alcohol use: Yes    Alcohol/week: 0.0 oz  Comment: Occ  . Drug use: No  . Sexual activity: Yes  Lifestyle  . Physical activity:    Days per week: Not on file    Minutes per session: Not on file  . Stress: Not on file  Relationships  . Social connections:    Talks on phone: Not on file    Gets together: Not on file    Attends religious service: Not on file    Active member of club or organization: Not on file    Attends meetings of clubs or organizations: Not on file    Relationship status: Not on file  . Intimate partner violence:    Fear of current or ex partner: Not on file    Emotionally abused: Not on file    Physically abused: Not on file    Forced sexual activity: Not on file  Other  Topics Concern  . Not on file  Social History Narrative  . Not on file    Allergies  Allergen Reactions  . Erythromycin   . Iodine Nausea Only    Betadine okay   . Shellfish Allergy   . Penicillins Rash    Outpatient Medications Prior to Visit  Medication Sig Dispense Refill  . aspirin EC 81 MG tablet Take 81 mg by mouth daily.     . Calcium Carbonate-Vit D-Min (CALCIUM 1200 PO) Take 600 mg by mouth daily.    . Cholecalciferol (VITAMIN D3) 10000 units TABS Take by mouth.    . gabapentin (NEURONTIN) 100 MG capsule Take 300 mg by mouth 3 (three) times daily. neurology    . isosorbide mononitrate (IMDUR) 30 MG 24 hr tablet Take 1 tablet by mouth daily.    Marland Kitchen letrozole (FEMARA) 2.5 MG tablet TAKE 1 TABLET BY MOUTH EVERY DAY 90 tablet 1  . Probiotic Product (HEALTHY COLON PO) Take by mouth at bedtime.    . vitamin B-12 (CYANOCOBALAMIN) 100 MCG tablet Take 100 mcg by mouth daily.    Marland Kitchen buPROPion (WELLBUTRIN) 100 MG tablet Take 1 tablet (100 mg total) by mouth 2 (two) times daily. 60 tablet 1  . cyclobenzaprine (FLEXERIL) 5 MG tablet Take 1 tablet (5 mg total) 3 (three) times daily as needed by mouth for muscle spasms. (Patient not taking: Reported on 11/21/2017) 30 tablet 1   No facility-administered medications prior to visit.     Review of Systems  Constitutional: Positive for unexpected weight change. Negative for appetite change, chills, fatigue, fever, malaise/fatigue and weight loss.  HENT: Negative for ear discharge, ear pain and sore throat.   Eyes: Negative for blurred vision.  Respiratory: Positive for shortness of breath. Negative for cough, sputum production and wheezing.   Cardiovascular: Negative for chest pain, palpitations, claudication, leg swelling and near-syncope.  Gastrointestinal: Negative for abdominal pain, blood in stool, constipation, diarrhea, heartburn, melena and nausea.  Genitourinary: Positive for nocturia. Negative for dysuria, frequency, hematuria and  urgency.  Musculoskeletal: Negative for back pain, joint pain, myalgias, muscle weakness and neck pain.  Skin: Negative for rash.  Neurological: Negative for dizziness, tingling, sensory change, focal weakness and headaches.  Endo/Heme/Allergies: Negative for environmental allergies and polydipsia. Does not bruise/bleed easily.  Psychiatric/Behavioral: Positive for depression. Negative for decreased concentration and suicidal ideas. The patient is not nervous/anxious and does not have insomnia.      Objective  Vitals:   12/26/17 1032  BP: 134/70  Pulse: 72  Weight: 143 lb (64.9 kg)  Height: 5\' 7"  (1.702 m)    Physical  Exam  Constitutional: She is oriented to person, place, and time. She appears well-developed and well-nourished. She is not irritable.  HENT:  Head: Normocephalic.  Right Ear: External ear normal.  Left Ear: External ear normal.  Mouth/Throat: Oropharynx is clear and moist.  Eyes: Pupils are equal, round, and reactive to light. Conjunctivae and EOM are normal. Lids are everted and swept, no foreign bodies found. Left eye exhibits no hordeolum. No foreign body present in the left eye. Right conjunctiva is not injected. Left conjunctiva is not injected. No scleral icterus.  Neck: Normal range of motion. Neck supple. No JVD present. No tracheal deviation present. No thyromegaly present.  Cardiovascular: Normal rate, regular rhythm, normal heart sounds and intact distal pulses. Exam reveals no gallop and no friction rub.  No murmur heard. Pulmonary/Chest: Effort normal and breath sounds normal. No respiratory distress. She has no wheezes. She has no rales.  Abdominal: Soft. Bowel sounds are normal. She exhibits no mass. There is no hepatosplenomegaly. There is no tenderness. There is no rebound and no guarding.  Musculoskeletal: Normal range of motion. She exhibits no edema or tenderness.  Lymphadenopathy:    She has no cervical adenopathy.  Neurological: She is alert and  oriented to person, place, and time. She has normal strength. She displays normal reflexes. No cranial nerve deficit.  Skin: Skin is warm. No rash noted.  Psychiatric: She has a normal mood and affect. Her mood appears not anxious. She does not exhibit a depressed mood.  Nursing note and vitals reviewed.     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[place])     Other   Recurrent major depressive disorder, in partial remission ([place]) - Primary   Relevant Medications   buPROPion (WELLBUTRIN) 100 MG tablet    Other Visit Diagnoses    Chronic systolic (congestive) heart failure ([place])       Relevant Orders   Ambulatory referral to Cardiology   Coronary artery disease involving native coronary artery of native heart without angina pectoris       Relevant Orders   Ambulatory referral to Cardiology   Cigarette nicotine dependence without complication       Relevant Medications   buPROPion (WELLBUTRIN) 100 MG tablet      Meds ordered this encounter  Medications  . buPROPion (WELLBUTRIN) 100 MG tablet    Sig: Take 1 tablet (100 mg total) by mouth 2 (two) times daily.    Dispense:  60 tablet    Refill:  6      Dr. Otilio Miu [facility]  12/26/17

## 2018-01-06 DIAGNOSIS — I5022 Chronic systolic (congestive) heart failure: Secondary | ICD-10-CM | POA: Diagnosis not present

## 2018-01-06 DIAGNOSIS — I2510 Atherosclerotic heart disease of native coronary artery without angina pectoris: Secondary | ICD-10-CM | POA: Diagnosis not present

## 2018-01-06 DIAGNOSIS — I447 Left bundle-branch block, unspecified: Secondary | ICD-10-CM | POA: Diagnosis not present

## 2018-03-03 ENCOUNTER — Ambulatory Visit (INDEPENDENT_AMBULATORY_CARE_PROVIDER_SITE_OTHER): Payer: PPO

## 2018-03-03 VITALS — BP 124/60 | HR 67 | Temp 98.3°F | Resp 12 | Ht 67.0 in | Wt 145.2 lb

## 2018-03-03 DIAGNOSIS — T730XXA Starvation, initial encounter: Secondary | ICD-10-CM

## 2018-03-03 DIAGNOSIS — Z0000 Encounter for general adult medical examination without abnormal findings: Secondary | ICD-10-CM | POA: Diagnosis not present

## 2018-03-03 DIAGNOSIS — Z9181 History of falling: Secondary | ICD-10-CM | POA: Diagnosis not present

## 2018-03-03 DIAGNOSIS — Z598 Other problems related to housing and economic circumstances: Secondary | ICD-10-CM

## 2018-03-03 DIAGNOSIS — H9193 Unspecified hearing loss, bilateral: Secondary | ICD-10-CM | POA: Diagnosis not present

## 2018-03-03 DIAGNOSIS — Z599 Problem related to housing and economic circumstances, unspecified: Secondary | ICD-10-CM

## 2018-03-03 NOTE — Progress Notes (Signed)
Subjective:   Veronica Ellis is a 84 y.o. female who presents for Medicare Annual (Subsequent) preventive examination.  Review of Systems:  N/A Cardiac Risk Factors include: advanced age (>17men, >17 women);dyslipidemia;sedentary lifestyle;smoking/ tobacco exposure     Objective:     Vitals: BP 124/60 (BP Location: Right Arm, Patient Position: Sitting, Cuff Size: Normal)   Pulse 67   Temp 98.3 F (36.8 C) (Oral)   Resp 12   Ht 5\' 7"  (1.702 m)   Wt 145 lb 3.2 oz (65.9 kg)   SpO2 93%   BMI 22.74 kg/m   Body mass index is 22.74 kg/m.  Advanced Directives 03/03/2018 12/11/2017 02/25/2017 12/25/2016 10/05/2016 04/22/2016 02/17/2016  Does Patient Have a Medical Advance Directive? No No Yes No No No No  Type of Advance Directive - Public librarian;Living will - - - -  Copy of [place] in Chart? - - No - copy requested - - - -  Would patient like information on creating a medical advance directive? Yes (MAU/Ambulatory/Procedural Areas - Information given) Yes (MAU/Ambulatory/Procedural Areas - Information given) - No - Patient declined No - Patient declined No - patient declined information No - patient declined information    Tobacco Social History   Tobacco Use  Smoking Status Former Smoker  . Packs/day: 1.00  . Years: 60.00  . Pack years: 60.00  . Types: Cigarettes  . Last attempt to quit: 12/2017  . Years since quitting: 0.2  Smokeless Tobacco Never Used  Tobacco Comment   smoking cessation materials not required     Counseling given: No Comment: smoking cessation materials not required  Clinical Intake:  Pre-visit preparation completed: Yes  Pain : No/denies pain   BMI - recorded: 22.74 Nutritional Status: BMI of 19-24  Normal Nutritional Risks: None Diabetes: No  How often do you need to have someone help you when you read instructions, pamphlets, or other written materials from your doctor or pharmacy?: 1 -  Never  Interpreter Needed?: No  Information entered by :: AEversole, LPN  Past Medical History:  Diagnosis Date  . Anxiety   . Anxiety   . Arthritis   . Breast cancer ([place]) 2014   bilateral invasive mammary carcinoma. with 36 rad tx.   . Breast cancer ([place])   . Cancer ([place])    breast  . Depression   . Depression   . Hard of hearing   . Hyperlipemia   . Hypertension   . IBS (irritable bowel syndrome)   . Memory loss   . Osteoporosis   . Wears dentures    full upper, partial lower   Past Surgical History:  Procedure Laterality Date  . APPENDECTOMY    . BREAST BIOPSY Bilateral 2014  . BREAST SURGERY Bilateral   . CATARACT EXTRACTION W/PHACO Right 12/11/2017   Procedure: CATARACT EXTRACTION PHACO AND INTRAOCULAR LENS PLACEMENT ([place]) COMPLICATED RIGHT;  Surgeon: Leandrew Koyanagi, MD;  Location: [facility];  Service: Ophthalmology;  Laterality: Right;  . COLON SURGERY     12 in. removed due to polyps  . TUBAL LIGATION     Family History  Problem Relation Age of Onset  . Breast cancer Daughter 33  . Breast cancer Daughter 68  . Cirrhosis Mother   . Lung cancer Father    Social History   Socioeconomic History  . Marital status: Widowed    Spouse name: Not on file  . Number of children: 5  . Years of  education: some college  . Highest education level: 12th grade  Occupational History  . Occupation: Retired  Scientific laboratory technician  . Financial resource strain: Not hard at all  . Food insecurity:    Worry: Never true    Inability: Never true  . Transportation needs:    Medical: No    Non-medical: No  Tobacco Use  . Smoking status: Former Smoker    Packs/day: 1.00    Years: 60.00    Pack years: 60.00    Types: Cigarettes    Last attempt to quit: 12/2017    Years since quitting: 0.2  . Smokeless tobacco: Never Used  . Tobacco comment: smoking cessation materials not required  Substance and Sexual Activity  . Alcohol use: Not Currently    Alcohol/week:  0.0 oz  . Drug use: No  . Sexual activity: Yes  Lifestyle  . Physical activity:    Days per week: 0 days    Minutes per session: 0 min  . Stress: Rather much  Relationships  . Social connections:    Talks on phone: Patient refused    Gets together: Patient refused    Attends religious service: Patient refused    Active member of club or organization: Patient refused    Attends meetings of clubs or organizations: Patient refused    Relationship status: Widowed  Other Topics Concern  . Not on file  Social History Narrative  . Not on file    Outpatient Encounter Medications as of 03/03/2018  Medication Sig  . aspirin EC 81 MG tablet Take 81 mg by mouth daily.   Marland Kitchen buPROPion (WELLBUTRIN) 100 MG tablet Take 1 tablet (100 mg total) by mouth 2 (two) times daily.  . Calcium Carbonate-Vit D-Min (CALCIUM 1200 PO) Take 600 mg by mouth daily.  . Cholecalciferol (VITAMIN D3) 10000 units TABS Take by mouth.  . gabapentin (NEURONTIN) 100 MG capsule Take 300 mg by mouth 3 (three) times daily. neurology  . isosorbide mononitrate (IMDUR) 30 MG 24 hr tablet Take 1 tablet by mouth daily.  Marland Kitchen letrozole (FEMARA) 2.5 MG tablet TAKE 1 TABLET BY MOUTH EVERY DAY  . Probiotic Product (HEALTHY COLON PO) Take by mouth at bedtime.  . vitamin B-12 (CYANOCOBALAMIN) 100 MCG tablet Take 100 mcg by mouth daily.   No facility-administered encounter medications on file as of 03/03/2018.     Activities of Daily Living In your present state of health, do you have any difficulty performing the following activities: 03/03/2018 12/11/2017  Hearing? Y Y  Comment unable to hear -  Vision? N N  Comment wears eyeglasses -  Difficulty concentrating or making decisions? Y N  Walking or climbing stairs? N N  Dressing or bathing? N N  Doing errands, shopping? N -  Preparing Food and eating ? N -  Comment full set upper and lower dentures -  Using the Toilet? N -  In the past six months, have you accidently leaked urine? Y  -  Comment urgency -  Do you have problems with loss of bowel control? N -  Managing your Medications? N -  Managing your Finances? N -  Housekeeping or managing your Housekeeping? N -  Some recent data might be hidden    [name], MD as PCP - General (Family Medicine) Lloyd Huger, MD as Consulting Physician (Oncology)    Assessment:   This is a routine wellness examination for Jerriah.  Exercise Activities and Dietary recommendations Current Exercise  Habits: The patient does not participate in regular exercise at present, Exercise limited by: Other - see comments(dementia)  Goals    . DIET - INCREASE WATER INTAKE     Recommend to drink at least 6-8 8oz glasses of water per day.       Fall Risk Fall Risk  03/03/2018 02/25/2017 02/13/2016 02/16/2015  Falls in the past year? No Yes Yes Yes  Number falls in past yr: - 1 1 2  or more  Injury with Fall? - Yes No Yes  Risk for fall due to : Impaired vision;Impaired balance/gait;Other (Comment);Medication side effect - - Impaired balance/gait  Risk for fall due to: Comment wears eyeglasses; dizziness; syncope; dementia - - -  Follow up - Falls prevention discussed - Falls prevention discussed;Falls evaluation completed   FALL RISK PREVENTION PERTAINING TO HOME: Is your home free of loose throw rugs in walkways, pet beds, electrical cords, etc? Yes Is there adequate lighting in your home to reduce risk of falls?  Yes Are there stairs in or around your home WITH handrails? Yes  ASSISTIVE DEVICES UTILIZED TO PREVENT FALLS: Use of a cane, walker or w/c? No Grab bars in the bathroom? No  Shower chair or a place to sit while bathing? No An elevated toilet seat or a handicapped toilet? No  Timed Get Up and Go Performed: Yes. Pt ambulated 10 feet within 10 sec. Gait slow, steady and without the use of an assistive device. No intervention required at this time. Fall risk prevention has been  discussed.  Community Resource Referral:  Data processing manager Referral sent to [employer] for installation of grab bars in the shower and a shower chair. Pt declined my offer to send Community Resource Referral to [facility] for an elevated toilet seat.  Depression Screen PHQ 2/9 Scores 03/03/2018 12/26/2017 02/25/2017 02/13/2016  PHQ - 2 Score 4 1 0 0  PHQ- 9 Score 11 2 - -     Cognitive Function     6CIT Screen 03/03/2018 02/25/2017  What Year? 0 points 0 points  What month? 0 points 0 points  What time? 0 points 0 points  Count back from 20 0 points 0 points  Months in reverse 0 points 0 points  Repeat phrase 6 points 0 points  Total Score 6 0     There is no immunization history on file for this patient.  Qualifies for Shingles Vaccine? Yes. Due for Shingrix. Education has been provided regarding the importance of this vaccine. Pt has been advised to call her insurance company to determine her out of pocket expense. Advised she may also receive this vaccine at her local pharmacy or Health Dept. Verbalized acceptance and understanding.  Overdue for Flu vaccine. Education has been provided regarding the importance of this vaccine and advised to receive when available. Verbalized acceptance and understanding.  Due for Pneumoccocal vaccine. Declined my offer to administer today. Education has been provided regarding the importance of this vaccine but still declined. Pt has been advised to call our office if she should change her mind and wish to receive this vaccine. Also advised she may receive this vaccine at her local pharmacy or Health Dept. Pt is aware to provide a copy of her vaccination record if she chooses to receive this vaccine at her local pharmacy. Verbalized acceptance and understanding.  Due for Tdap vaccine. Education has been provided regarding the importance of this vaccine. Pt has been advised she may receive this vaccine at her local pharmacy  or Health Dept. Also advised  to provide a copy of her vaccination record if she chooses to receive this vaccine at her local pharmacy. Verbalized acceptance and understanding.  Screening Tests Health Maintenance  Topic Date Due  . TETANUS/TDAP  03/04/2019 (Originally 11/11/1952)  . PNA vac Low Risk Adult (1 of 2 - PCV13) 03/04/2019 (Originally 11/10/2000)  . INFLUENZA VACCINE  04/10/2018  . DEXA SCAN  Completed    Cancer Screenings: Lung: Low Dose CT Chest recommended if Age 60-80 years, 30 pack-year currently smoking OR have quit w/in 15years. Patient does not qualify. Breast Screening: No longer required   Bone Density/Dexa: No longer required Colorectal: No longer required  Additional Screenings: Hepatitis C Screening: Does not qualify    Plan:  I have personally reviewed and addressed the Medicare Annual Wellness questionnaire and have noted the following in the patient's chart:  A. Medical and social history B. Use of alcohol, tobacco or illicit drugs  C. Current medications and supplements D. Functional ability and status E.  Nutritional status F.  Physical activity G. Advance directives H. List of other physicians I.  Hospitalizations, surgeries, and ER visits in previous 12 months J.  [place] such as hearing and vision if needed, cognitive and depression L. Referrals and appointments  In addition, I have reviewed and discussed with patient certain preventive protocols, quality metrics, and best practice recommendations. A written personalized care plan for preventive services as well as general preventive health recommendations were provided to patient.  Signed,  Aleatha Borer, LPN Nurse Health Advisor  MD Recommendations: Due for Shingrix. Education has been provided regarding the importance of this vaccine. Pt has been advised to call her insurance company to determine her out of pocket expense. Advised she may also receive this vaccine at her local pharmacy or Health Dept. Verbalized  acceptance and understanding.  Overdue for Flu vaccine. Education has been provided regarding the importance of this vaccine and advised to receive when available. Verbalized acceptance and understanding.  Due for Pneumoccocal vaccine. Declined my offer to administer today. Education has been provided regarding the importance of this vaccine but still declined. Pt has been advised to call our office if she should change her mind and wish to receive this vaccine. Also advised she may receive this vaccine at her local pharmacy or Health Dept. Pt is aware to provide a copy of her vaccination record if she chooses to receive this vaccine at her local pharmacy. Verbalized acceptance and understanding.  Due for Tdap vaccine. Education has been provided regarding the importance of this vaccine. Pt has been advised she may receive this vaccine at her local pharmacy or Health Dept. Also advised to provide a copy of her vaccination record if she chooses to receive this vaccine at her local pharmacy. Verbalized acceptance and understanding.

## 2018-03-03 NOTE — Patient Instructions (Signed)
Veronica Ellis , Thank you for taking time to come for your Medicare Wellness Visit. I appreciate your ongoing commitment to your health goals. Please review the following plan we discussed and let me know if I can assist you in the future.   Screening recommendations/referrals: Colorectal Screening: Not required Mammogram: Not required Bone Density: Not required  Vision and Dental Exams: Recommended annual ophthalmology exams for early detection of glaucoma and other disorders of the eye Recommended annual dental exams for proper oral hygiene  Vaccinations: Influenza vaccine: Declined Pneumococcal vaccine: Declined Tdap vaccine: Declined Shingles vaccine: Declined    Advanced directives: Advance directive discussed with you today. I have provided a copy for you to complete at home and have notarized. Once this is complete please bring a copy in to our office so we can scan it into your chart.  Goals: Recommend to drink at least 6-8 8oz glasses of water per day.  Next appointment: Please schedule your Annual Wellness Visit with your Nurse Health Advisor in one year.  Preventive Care 22 Years and Older, Female Preventive care refers to lifestyle choices and visits with your health care provider that can promote health and wellness. What does preventive care include?  A yearly physical exam. This is also called an annual well check.  Dental exams once or twice a year.  Routine eye exams. Ask your health care provider how often you should have your eyes checked.  Personal lifestyle choices, including:  Daily care of your teeth and gums.  Regular physical activity.  Eating a healthy diet.  Avoiding tobacco and drug use.  Limiting alcohol use.  Practicing safe sex.  Taking low-dose aspirin every day.  Taking vitamin and mineral supplements as recommended by your health care provider. What happens during an annual well check? The services and screenings done by your health  care provider during your annual well check will depend on your age, overall health, lifestyle risk factors, and family history of disease. Counseling  Your health care provider may ask you questions about your:  Alcohol use.  Tobacco use.  Drug use.  Emotional well-being.  Home and relationship well-being.  Sexual activity.  Eating habits.  History of falls.  Memory and ability to understand (cognition).  Work and work Statistician.  Reproductive health. Screening  You may have the following tests or measurements:  Height, weight, and BMI.  Blood pressure.  Lipid and cholesterol levels. These may be checked every 5 years, or more frequently if you are over 44 years old.  Skin check.  Lung cancer screening. You may have this screening every year starting at age 11 if you have a 30-pack-year history of smoking and currently smoke or have quit within the past 15 years.  Fecal occult blood test (FOBT) of the stool. You may have this test every year starting at age 71.  Flexible sigmoidoscopy or colonoscopy. You may have a sigmoidoscopy every 5 years or a colonoscopy every 10 years starting at age 80.  Hepatitis C blood test.  Hepatitis B blood test.  Sexually transmitted disease (STD) testing.  Diabetes screening. This is done by checking your blood sugar (glucose) after you have not eaten for a while (fasting). You may have this done every 1-3 years.  Bone density scan. This is done to screen for osteoporosis. You may have this done starting at age 66.  Mammogram. This may be done every 1-2 years. Talk to your health care provider about how often you should have regular  mammograms. Talk with your health care provider about your test results, treatment options, and if necessary, the need for more tests. Vaccines  Your health care provider may recommend certain vaccines, such as:  Influenza vaccine. This is recommended every year.  Tetanus, diphtheria, and  acellular pertussis (Tdap, Td) vaccine. You may need a Td booster every 10 years.  Zoster vaccine. You may need this after age 104.  Pneumococcal 13-valent conjugate (PCV13) vaccine. One dose is recommended after age 75.  Pneumococcal polysaccharide (PPSV23) vaccine. One dose is recommended after age 8. Talk to your health care provider about which screenings and vaccines you need and how often you need them. This information is not intended to replace advice given to you by your health care provider. Make sure you discuss any questions you have with your health care provider. Document Released: 09/23/2015 Document Revised: 05/16/2016 Document Reviewed: 06/28/2015 Elsevier Interactive Patient Education  2017 [employer] Prevention in the Home Falls can cause injuries. They can happen to people of all ages. There are many things you can do to make your home safe and to help prevent falls. What can I do on the outside of my home?  Regularly fix the edges of walkways and driveways and fix any cracks.  Remove anything that might make you trip as you walk through a door, such as a raised step or threshold.  Trim any bushes or trees on the path to your home.  Use bright outdoor lighting.  Clear any walking paths of anything that might make someone trip, such as rocks or tools.  Regularly check to see if handrails are loose or broken. Make sure that both sides of any steps have handrails.  Any raised decks and porches should have guardrails on the edges.  Have any leaves, snow, or ice cleared regularly.  Use sand or salt on walking paths during winter.  Clean up any spills in your garage right away. This includes oil or grease spills. What can I do in the bathroom?  Use night lights.  Install grab bars by the toilet and in the tub and shower. Do not use towel bars as grab bars.  Use non-skid mats or decals in the tub or shower.  If you need to sit down in the shower, use  a plastic, non-slip stool.  Keep the floor dry. Clean up any water that spills on the floor as soon as it happens.  Remove soap buildup in the tub or shower regularly.  Attach bath mats securely with double-sided non-slip rug tape.  Do not have throw rugs and other things on the floor that can make you trip. What can I do in the bedroom?  Use night lights.  Make sure that you have a light by your bed that is easy to reach.  Do not use any sheets or blankets that are too big for your bed. They should not hang down onto the floor.  Have a firm chair that has side arms. You can use this for support while you get dressed.  Do not have throw rugs and other things on the floor that can make you trip. What can I do in the kitchen?  Clean up any spills right away.  Avoid walking on wet floors.  Keep items that you use a lot in easy-to-reach places.  If you need to reach something above you, use a strong step stool that has a grab bar.  Keep electrical cords out of the way.  Do not use floor polish or wax that makes floors slippery. If you must use wax, use non-skid floor wax.  Do not have throw rugs and other things on the floor that can make you trip. What can I do with my stairs?  Do not leave any items on the stairs.  Make sure that there are handrails on both sides of the stairs and use them. Fix handrails that are broken or loose. Make sure that handrails are as long as the stairways.  Check any carpeting to make sure that it is firmly attached to the stairs. Fix any carpet that is loose or worn.  Avoid having throw rugs at the top or bottom of the stairs. If you do have throw rugs, attach them to the floor with carpet tape.  Make sure that you have a light switch at the top of the stairs and the bottom of the stairs. If you do not have them, ask someone to add them for you. What else can I do to help prevent falls?  Wear shoes that:  Do not have high heels.  Have  rubber bottoms.  Are comfortable and fit you well.  Are closed at the toe. Do not wear sandals.  If you use a stepladder:  Make sure that it is fully opened. Do not climb a closed stepladder.  Make sure that both sides of the stepladder are locked into place.  Ask someone to hold it for you, if possible.  Clearly mark and make sure that you can see:  Any grab bars or handrails.  First and last steps.  Where the edge of each step is.  Use tools that help you move around (mobility aids) if they are needed. These include:  Canes.  Walkers.  Scooters.  Crutches.  Turn on the lights when you go into a dark area. Replace any light bulbs as soon as they burn out.  Set up your furniture so you have a clear path. Avoid moving your furniture around.  If any of your floors are uneven, fix them.  If there are any pets around you, be aware of where they are.  Review your medicines with your doctor. Some medicines can make you feel dizzy. This can increase your chance of falling. Ask your doctor what other things that you can do to help prevent falls. This information is not intended to replace advice given to you by your health care provider. Make sure you discuss any questions you have with your health care provider. Document Released: 06/23/2009 Document Revised: 02/02/2016 Document Reviewed: 10/01/2014 Elsevier Interactive Patient Education  2017 [employer].

## 2018-05-02 ENCOUNTER — Inpatient Hospital Stay: Payer: PPO | Attending: Oncology | Admitting: Oncology

## 2018-05-02 VITALS — BP 137/89 | HR 78 | Temp 97.4°F | Resp 18 | Wt 147.9 lb

## 2018-05-02 DIAGNOSIS — M8580 Other specified disorders of bone density and structure, unspecified site: Secondary | ICD-10-CM

## 2018-05-02 DIAGNOSIS — Z801 Family history of malignant neoplasm of trachea, bronchus and lung: Secondary | ICD-10-CM

## 2018-05-02 DIAGNOSIS — Z170 Estrogen receptor positive status [ER+]: Secondary | ICD-10-CM

## 2018-05-02 DIAGNOSIS — C50919 Malignant neoplasm of unspecified site of unspecified female breast: Secondary | ICD-10-CM

## 2018-05-02 DIAGNOSIS — Z87891 Personal history of nicotine dependence: Secondary | ICD-10-CM

## 2018-05-02 DIAGNOSIS — I10 Essential (primary) hypertension: Secondary | ICD-10-CM

## 2018-05-02 DIAGNOSIS — Z803 Family history of malignant neoplasm of breast: Secondary | ICD-10-CM | POA: Diagnosis not present

## 2018-05-02 DIAGNOSIS — C50911 Malignant neoplasm of unspecified site of right female breast: Secondary | ICD-10-CM

## 2018-05-02 DIAGNOSIS — Z79811 Long term (current) use of aromatase inhibitors: Secondary | ICD-10-CM | POA: Diagnosis not present

## 2018-05-02 DIAGNOSIS — C50912 Malignant neoplasm of unspecified site of left female breast: Secondary | ICD-10-CM | POA: Diagnosis not present

## 2018-05-02 NOTE — Progress Notes (Signed)
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  INTERVAL HISTORY: Patient was last evaluated in clinic in January 2018.  She continues to be noncompliant with appointments, but states she continues to take her letrozole.  She currently feels well and is asymptomatic.  She continues to be highly anxious.  She does not complain of weakness or fatigue today.  She has no neurologic complaints.  She denies any recent fevers or illnesses.  She has a good appetite and denies weight loss.  She denies any nausea, vomiting, constipation, or diarrhea.  She has no urinary complaints.  Patient offers no specific complaints today.  REVIEW OF SYSTEMS:   Review of Systems  Constitutional: Negative.  Negative for chills, fever, malaise/fatigue and weight loss.  HENT: Negative for congestion.   Respiratory: Negative.  Negative for cough and shortness of breath.   Cardiovascular: Negative.  Negative for chest pain and leg swelling.  Gastrointestinal: Negative.  Negative for abdominal pain, constipation, diarrhea, heartburn, nausea and vomiting.  Genitourinary: Negative.  Negative for frequency, hematuria and urgency.  Musculoskeletal: Negative.  Negative for falls and neck pain.  Neurological: Negative.  Negative for sensory change, focal weakness, weakness and headaches.  Psychiatric/Behavioral: Negative for memory loss. The patient is nervous/anxious. The patient does not have insomnia.     As per HPI. Otherwise, a complete review of systems is negative.  PAST MEDICAL HISTORY: Past Medical History:    Diagnosis Date  . Anxiety   . Anxiety   . Arthritis   . Breast cancer ([place]) 2014   bilateral invasive mammary carcinoma. with 36 rad tx.   . Breast cancer ([place])   . Cancer ([place])    breast  . Depression   . Depression   . Hard of hearing   . Hyperlipemia   . Hypertension   . IBS (irritable bowel syndrome)   . Memory loss   . Osteoporosis   . Wears dentures    full upper, partial lower    PAST SURGICAL HISTORY: Past Surgical History:  Procedure Laterality Date  . APPENDECTOMY    . BREAST BIOPSY Bilateral 2014  . BREAST SURGERY Bilateral   . CATARACT EXTRACTION W/PHACO Right 12/11/2017   Procedure: CATARACT EXTRACTION PHACO AND INTRAOCULAR LENS PLACEMENT ([place]) COMPLICATED RIGHT;  Surgeon: Leandrew Koyanagi, MD;  Location: [facility];  Service: Ophthalmology;  Laterality: Right;  . COLON SURGERY     12 in. removed due to polyps  . TUBAL LIGATION      FAMILY HISTORY Family History  Problem Relation Age of Onset  . Breast cancer Daughter 50  . Breast cancer Daughter 43  . Cirrhosis Mother   . Lung cancer Father        ADVANCED DIRECTIVES:    HEALTH MAINTENANCE: Social History   Tobacco Use  . Smoking status: Former Smoker    Packs/day: 1.00    Years: 60.00    Pack years: 60.00    Types: Cigarettes    Last attempt to quit: 12/2017    Years since quitting: 0.3  . Smokeless tobacco: Never Used  . Tobacco comment: smoking cessation materials not required  Substance  Use Topics  . Alcohol use: Not Currently    Alcohol/week: 0.0 standard drinks  . Drug use: No     Colonoscopy:  PAP:  Bone density:  Lipid panel:  Allergies  Allergen Reactions  . Erythromycin   . Iodine Nausea Only    Betadine okay   . Shellfish Allergy   . Penicillins Rash    Current Outpatient Medications  Medication Sig Dispense Refill  . buPROPion (WELLBUTRIN) 100 MG tablet Take 1 tablet (100 mg total) by mouth 2 (two) times daily. 60 tablet 6  . Cholecalciferol  (VITAMIN D3) 10000 units TABS Take by mouth.    . gabapentin (NEURONTIN) 100 MG capsule Take 300 mg by mouth 3 (three) times daily. neurology    . isosorbide mononitrate (IMDUR) 30 MG 24 hr tablet Take 1 tablet by mouth daily.    Marland Kitchen letrozole (FEMARA) 2.5 MG tablet TAKE 1 TABLET BY MOUTH EVERY DAY 90 tablet 1  . Probiotic Product (HEALTHY COLON PO) Take by mouth at bedtime.    . vitamin B-12 (CYANOCOBALAMIN) 100 MCG tablet Take 100 mcg by mouth daily.     No current facility-administered medications for this visit.     OBJECTIVE: Vitals:   05/02/18 1149  BP: 137/89  Pulse: 78  Resp: 18  Temp: (!) 97.4 F (36.3 C)     Body mass index is 23.17 kg/m.    ECOG FS:0 - Asymptomatic  General: Well-developed, well-nourished, no acute distress. Eyes: Pink conjunctiva, anicteric sclera. HEENT: Normocephalic, moist mucous membranes. Breast: Patient refused breast exam today. Lungs: Clear to auscultation bilaterally. Heart: Regular rate and rhythm. No rubs, murmurs, or gallops. Abdomen: Soft, nontender, nondistended. No organomegaly noted, normoactive bowel sounds. Musculoskeletal: No edema, cyanosis, or clubbing. Neuro: Alert, answering all questions appropriately. Cranial nerves grossly intact. Skin: No rashes or petechiae noted. Psych: Normal affect.  LAB RESULTS:  Lab Results  Component Value Date   NA 137 12/25/2016   K 3.8 12/25/2016   CL 106 12/25/2016   CO2 24 12/25/2016   GLUCOSE 111 (H) 12/25/2016   BUN 17 12/25/2016   CREATININE 0.73 12/25/2016   CALCIUM 9.1 12/25/2016   PROT 7.3 12/25/2016   ALBUMIN 3.9 12/25/2016   AST 17 12/25/2016   ALT 9 (L) 12/25/2016   ALKPHOS 63 12/25/2016   BILITOT 0.6 12/25/2016   GFRNONAA >60 12/25/2016   GFRAA >60 12/25/2016    Lab Results  Component Value Date   WBC 6.0 12/25/2016   NEUTROABS 5.3 10/15/2014   HGB 10.8 (L) 12/25/2016   HCT 33.9 (L) 12/25/2016   MCV 69.4 (L) 12/25/2016   PLT 365 12/25/2016     STUDIES: No  results found.  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  PLAN:    1. Bilateral breast cancer:  Previously after lengthy discussion with patient and her family about her intermediate Oncotype score, she declined adjuvant chemotherapy.  Unclear of patient's compliance with letrozole, but she will complete 5 years of treatment in January 2020.  Patient's most recent mammogram was in September 2017, repeat in the next 1 to 2 weeks.  Return to clinic in 6 months for routine evaluation.   2.  Osteopenia: Patient has not had a bone mineral density since September 2016, which reported T score of -2.0.  Repeat in the next 1 to 2 weeks.    I spent a total of 20 minutes  face-to-face with the patient of which greater than 50% of the visit was spent in counseling and coordination of care as detailed above.   Patient was seen and evaluated independently and I agree with the assessment and plan as dictated above.  Lloyd Huger, MD 05/02/18 12:10 PM

## 2018-05-02 NOTE — Progress Notes (Signed)
Patient denies any concerns today.  

## 2018-05-18 ENCOUNTER — Other Ambulatory Visit: Payer: Self-pay

## 2018-05-18 ENCOUNTER — Inpatient Hospital Stay
Admission: EM | Admit: 2018-05-18 | Discharge: 2018-05-21 | DRG: 280 | Disposition: A | Discharge status: Other Acute Inpatient Hospital | Payer: PPO | Attending: Internal Medicine | Admitting: Internal Medicine

## 2018-05-18 ENCOUNTER — Emergency Department: Payer: PPO

## 2018-05-18 DIAGNOSIS — J9601 Acute respiratory failure with hypoxia: Secondary | ICD-10-CM | POA: Diagnosis present

## 2018-05-18 DIAGNOSIS — M810 Age-related osteoporosis without current pathological fracture: Secondary | ICD-10-CM | POA: Diagnosis present

## 2018-05-18 DIAGNOSIS — H9190 Unspecified hearing loss, unspecified ear: Secondary | ICD-10-CM | POA: Diagnosis present

## 2018-05-18 DIAGNOSIS — I10 Essential (primary) hypertension: Secondary | ICD-10-CM | POA: Diagnosis not present

## 2018-05-18 DIAGNOSIS — I214 Non-ST elevation (NSTEMI) myocardial infarction: Secondary | ICD-10-CM | POA: Diagnosis present

## 2018-05-18 DIAGNOSIS — E785 Hyperlipidemia, unspecified: Secondary | ICD-10-CM | POA: Diagnosis present

## 2018-05-18 DIAGNOSIS — Z79811 Long term (current) use of aromatase inhibitors: Secondary | ICD-10-CM

## 2018-05-18 DIAGNOSIS — I5021 Acute systolic (congestive) heart failure: Secondary | ICD-10-CM | POA: Diagnosis not present

## 2018-05-18 DIAGNOSIS — R0602 Shortness of breath: Secondary | ICD-10-CM | POA: Diagnosis present

## 2018-05-18 DIAGNOSIS — Z9221 Personal history of antineoplastic chemotherapy: Secondary | ICD-10-CM

## 2018-05-18 DIAGNOSIS — I420 Dilated cardiomyopathy: Secondary | ICD-10-CM | POA: Diagnosis present

## 2018-05-18 DIAGNOSIS — Z853 Personal history of malignant neoplasm of breast: Secondary | ICD-10-CM | POA: Diagnosis not present

## 2018-05-18 DIAGNOSIS — Z923 Personal history of irradiation: Secondary | ICD-10-CM

## 2018-05-18 DIAGNOSIS — C50919 Malignant neoplasm of unspecified site of unspecified female breast: Secondary | ICD-10-CM | POA: Diagnosis not present

## 2018-05-18 DIAGNOSIS — I4891 Unspecified atrial fibrillation: Secondary | ICD-10-CM

## 2018-05-18 DIAGNOSIS — Z87891 Personal history of nicotine dependence: Secondary | ICD-10-CM

## 2018-05-18 DIAGNOSIS — I2510 Atherosclerotic heart disease of native coronary artery without angina pectoris: Secondary | ICD-10-CM | POA: Diagnosis present

## 2018-05-18 DIAGNOSIS — Z79899 Other long term (current) drug therapy: Secondary | ICD-10-CM | POA: Diagnosis not present

## 2018-05-18 DIAGNOSIS — I110 Hypertensive heart disease with heart failure: Secondary | ICD-10-CM | POA: Diagnosis present

## 2018-05-18 DIAGNOSIS — K589 Irritable bowel syndrome without diarrhea: Secondary | ICD-10-CM | POA: Diagnosis present

## 2018-05-18 DIAGNOSIS — I5023 Acute on chronic systolic (congestive) heart failure: Secondary | ICD-10-CM | POA: Diagnosis present

## 2018-05-18 DIAGNOSIS — I21A1 Myocardial infarction type 2: Secondary | ICD-10-CM | POA: Diagnosis present

## 2018-05-18 DIAGNOSIS — Z803 Family history of malignant neoplasm of breast: Secondary | ICD-10-CM | POA: Diagnosis not present

## 2018-05-18 LAB — CBC
HEMATOCRIT: 32.8 % — AB (ref 35.0–47.0)
Hemoglobin: 10.7 g/dL — ABNORMAL LOW (ref 12.0–16.0)
MCH: 24.6 pg — ABNORMAL LOW (ref 26.0–34.0)
MCHC: 32.7 g/dL (ref 32.0–36.0)
MCV: 75.2 fL — ABNORMAL LOW (ref 80.0–100.0)
PLATELETS: 339 10*3/uL (ref 150–440)
RBC: 4.36 MIL/uL (ref 3.80–5.20)
RDW: 17.5 % — ABNORMAL HIGH (ref 11.5–14.5)
WBC: 9.2 10*3/uL (ref 3.6–11.0)

## 2018-05-18 NOTE — ED Notes (Signed)
Pt 88-90% on room air; pt placed on 2L nasal cannula at this time.

## 2018-05-18 NOTE — ED Notes (Addendum)
Pt states that she has been SOB for past three days, pt denies chest pain. Pt is alert and oriented x 4. Pt placed on 2L nasal cannula due to O2sat being 88%RA.

## 2018-05-18 NOTE — ED Provider Notes (Signed)
[facility] Emergency Department Provider Note    First MD Initiated Contact with Patient 05/18/18 2352     (approximate)  I have reviewed the triage vital signs and the nursing notes.   HISTORY  Chief Complaint Shortness of Breath    HPI Veronica Ellis is a 84 y.o. female with below list of chronic medical conditions presents to the emergency department with acute onset of central nonradiating chest pain which patient states began tonight with accompanying shortness of breath.patient states current pain score is 9 out of 10. Patient denies any cough or fever. Patient denies any known history of atrial fibrillation or any other cardiac pathology.   Past Medical History:  Diagnosis Date  . Anxiety   . Anxiety   . Arthritis   . Breast cancer ([place]) 2014   bilateral invasive mammary carcinoma. with 36 rad tx.   . Breast cancer ([place])   . Cancer ([place])    breast  . Depression   . Depression   . Hard of hearing   . Hyperlipemia   . Hypertension   . IBS (irritable bowel syndrome)   . Memory loss   . Osteoporosis   . Wears dentures    full upper, partial lower    Patient Active Problem List   Diagnosis Date Noted  . NSTEMI (non-ST elevated myocardial infarction) ([place]) 05/19/2018  . Ataxia 12/31/2016  . Memory loss 12/31/2016  . Recurrent major depressive disorder, in partial remission ([place]) 12/31/2016  . Bipolar 1 disorder, mixed, moderate ([place]) 12/25/2016  . Bilateral breast cancer ([place]) 02/20/2016  . 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 Cerebrovascular disease 02/16/2015  . Anxiety 02/16/2015  . Breast lump 02/16/2015  . 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 Routine general medical examination at a health care facility 02/16/2015  . Recurrent major depressive episodes ([place]) 02/16/2015  . Below normal amount of sodium in the blood 02/16/2015  . Pre-operative  cardiovascular examination 02/16/2015    Past Surgical History:  Procedure Laterality Date  . APPENDECTOMY    . BREAST BIOPSY Bilateral 2014  . BREAST SURGERY Bilateral   . CATARACT EXTRACTION W/PHACO Right 12/11/2017   Procedure: CATARACT EXTRACTION PHACO AND INTRAOCULAR LENS PLACEMENT ([place]) COMPLICATED RIGHT;  Surgeon: Leandrew Koyanagi, MD;  Location: [facility];  Service: Ophthalmology;  Laterality: Right;  . COLON SURGERY     12 in. removed due to polyps  . TUBAL LIGATION      Prior to Admission medications   Medication Sig Start Date End Date Taking? Authorizing Provider  buPROPion (WELLBUTRIN) 100 MG tablet Take 1 tablet (100 mg total) by mouth 2 (two) times daily. 12/26/17  Yes Juline Patch, MD  Cholecalciferol (VITAMIN D3) 10000 units TABS Take 1,000 Units by mouth daily.    Yes [provider]  gabapentin (NEURONTIN) 300 MG capsule Take 300-600 mg by mouth 3 (three) times daily as needed (pain). neurology 10/11/16  Yes [provider]  isosorbide mononitrate (IMDUR) 30 MG 24 hr tablet Take 1 tablet by mouth daily. 01/20/16  Yes [provider]  letrozole ([place]) 2.5 MG tablet TAKE 1 TABLET BY MOUTH EVERY DAY 11/29/17  Yes Lloyd Huger, MD  Omega-3 Fatty Acids (FISH OIL) 1000 MG CAPS Take 1 capsule by mouth daily.   Yes [provider]  Probiotic Product (HEALTHY COLON PO) Take 1 capsule by mouth at bedtime.    Yes [provider]  vitamin  B-12 (CYANOCOBALAMIN) 100 MCG tablet Take 100 mcg by mouth daily.   Yes [provider]    Allergies Iodine; Erythromycin; Penicillins; and Shellfish allergy  Family History  Problem Relation Age of Onset  . Breast cancer Daughter 4  . Breast cancer Daughter 62  . Cirrhosis Mother   . Lung cancer Father     Social History Social History   Tobacco Use  . Smoking status: Former Smoker    Packs/day: 1.00    Years: 60.00    Pack years: 60.00    Types:  Cigarettes    Last attempt to quit: 12/2017    Years since quitting: 0.4  . Smokeless tobacco: Never Used  . Tobacco comment: smoking cessation materials not required  Substance Use Topics  . Alcohol use: Not Currently    Alcohol/week: 0.0 standard drinks  . Drug use: No    Review of Systems Constitutional: No fever/chills Eyes: No visual changes. ENT: No sore throat. Cardiovascular: positive for chest pain Respiratory: Denies shortness of breath. Gastrointestinal: No abdominal pain.  No nausea, no vomiting.  No diarrhea.  No constipation. Genitourinary: Negative for dysuria. Musculoskeletal: Negative for neck pain.  Negative for back pain. Integumentary: Negative for rash. Neurological: Negative for headaches, focal weakness or numbness.   ____________________________________________   PHYSICAL EXAM:  VITAL SIGNS: ED Triage Vitals [05/18/18 2327]  Enc Vitals Group     BP (!) 157/93     Pulse Rate (!) 115     Resp (!) 22     Temp 98.4 F (36.9 C)     Temp Source Oral     SpO2 92 %     Weight 65.8 kg (145 lb)     Height 1.702 m (5\' 7" )     Head Circumference      Peak Flow      Pain Score 0     Pain Loc      Pain Edu?      Excl. in [place]?     Constitutional: Alert and oriented. Well appearing and in no acute distress. Eyes: Conjunctivae are normal. PERRL. EOMI. Head: Atraumatic. Mouth/Throat: Mucous membranes are moist. Oropharynx non-erythematous. Neck: No stridor.   Cardiovascular: tachycardia, irregular heartbeat. Good peripheral pulses Respiratory: Normal respiratory effort.  No retractions. Lungs CTAB. Gastrointestinal: Soft and nontender. No distention.  Musculoskeletal: No lower extremity tenderness nor edema. No gross deformities of extremities. Neurologic:  Normal speech and language. No gross focal neurologic deficits are appreciated.  Skin:  Skin is warm, dry and intact. No rash noted. Psychiatric: Mood and affect are normal. Speech and behavior are  normal.  ____________________________________________   LABS (all labs ordered are listed, but only abnormal results are displayed)  Labs Reviewed  BASIC METABOLIC PANEL - Abnormal; Notable for the following components:      Result Value   Glucose, Bld 113 (*)    Calcium 8.6 (*)    All other components within normal limits  CBC - Abnormal; Notable for the following components:   Hemoglobin 10.7 (*)    HCT 32.8 (*)    MCV 75.2 (*)    MCH 24.6 (*)    RDW 17.5 (*)    All other components within normal limits  TROPONIN I - Abnormal; Notable for the following components:   Troponin I 0.12 (*)    All other components within normal limits   ____________________________________________  EKG  ED ECG REPORT I, Marinette N Brihana Quickel, the attending physician, personally viewed and interpreted this  ECG.   Date: 05/19/2018  EKG Time: 11:28 PM   Rate: 148  Rhythm: atrial fibrillation with rapid ventricular response. Left bundle branch block  Axis: left axis deviation  Intervals:irregular RR interval  ST&T Change: none   RADIOLOGY I, [place] N [place] Garabedian, personally viewed and evaluated these images (plain radiographs) as part of my medical decision making, as well as reviewing the written report by the radiologist.  ED MD interpretation:  1. No pulmonary embolus. 2. CHF with cardiomegaly, pleural effusions and pulmonary edema. Peribronchial thickening felt to be congestive in secondary to pulmonary edema. 3. Multifocal mediastinal adenopathy, may be reactive and secondary to CHF. Given history of breast cancer, consider follow-up CT in 3 months after resolution of symptoms to ensure resolution. 4. Subpleural nodularity in the right middle lobe is slightly more prominent than in 5277 CT, of uncertain significance. Attention to this at follow-up recommended. 5. Indeterminate 11 mm gastrohepatic lymph node, this can also be reassessed on follow-up.  Aortic Atherosclerosis  (ICD10-I70.0).  Official radiology report(s): Dg Chest 2 View  Result Date: 05/19/2018 CLINICAL DATA:  Shortness S of breath for 3 days, history COPD, breast cancer, hypertension, former smoker EXAM: CHEST - 2 VIEW COMPARISON:  07/08/2017 FINDINGS: Enlargement of cardiac silhouette with pulmonary vascular congestion. Atherosclerotic calcification aorta. New diffuse interstitial infiltrates, greatest at bases, favor representing pulmonary edema Question tiny bibasilar effusions. Bones demineralized. IMPRESSION: Enlargement of cardiac silhouette with new pulmonary vascular congestion and scattered interstitial infiltrates favoring pulmonary edema. Electronically Signed   By: Lavonia [name] M.D.   On: 05/19/2018 00:16   Ct Angio Chest Pe W And/or Wo Contrast  Result Date: 05/19/2018 CLINICAL DATA:  Shortness of breath for 3 days. New onset atrial fibrillation. Chest pain, hypoxia. EXAM: CT ANGIOGRAPHY CHEST WITH CONTRAST TECHNIQUE: Multidetector CT imaging of the chest was performed using the standard protocol during bolus administration of intravenous contrast. Multiplanar CT image reconstructions and MIPs were obtained to evaluate the vascular anatomy. CONTRAST:  27mL OMNIPAQUE IOHEXOL 350 MG/ML SOLN COMPARISON:  Radiograph yesterday.  Chest CT 10/13/2014 FINDINGS: Cardiovascular: There are no filling defects within the pulmonary arteries to suggest pulmonary embolus. Diffuse atherosclerotic calcification of the thoracic aorta. Cannot assess for dissection given phase of contrast. Multi chamber cardiomegaly. There are coronary artery calcifications. No pericardial effusion. Mediastinum/Nodes: Multifocal mediastinal adenopathy including 16 mm lower paratracheal node, 13 mm upper paratracheal node, and 10 mm prevascular node. Small bilateral hilar nodes not enlarged by size criteria. Esophagus is nondistended. Thyroid gland is normal. Lungs/Pleura: Small left and small to moderate right pleural effusion. Peripheral  septal thickening and basilar ground-glass opacities consistent with pulmonary edema. There is peribronchial thickening felt to be congestive. Subpleural scarring in the anterior lungs likely postradiation change. 5 mm nodule in the superior segment of the left lower lobe is unchanged from prior exam and considered benign. Subpleural nodularity in the anterior right middle lobe, image 60 series 6, slightly more prominent than on prior exam. Some of the additional nodules on prior exam are obscured. Upper Abdomen: Bilateral adrenal nodularity unchanged from 2016 and considered benign. Splenic calcifications are chronic. There is an 11 mm gastrohepatic lymph node that is nonspecific. Musculoskeletal: There are no acute or suspicious osseous abnormalities. Review of the MIP images confirms the above findings. IMPRESSION: 1. No pulmonary embolus. 2. CHF with cardiomegaly, pleural effusions and pulmonary edema. Peribronchial thickening felt to be congestive in secondary to pulmonary edema. 3. Multifocal mediastinal adenopathy, may be reactive and secondary to CHF. Given  history of breast cancer, consider follow-up CT in 3 months after resolution of symptoms to ensure resolution. 4. Subpleural nodularity in the right middle lobe is slightly more prominent than in 4163 CT, of uncertain significance. Attention to this at follow-up recommended. 5. Indeterminate 11 mm gastrohepatic lymph node, this can also be reassessed on follow-up. Aortic Atherosclerosis (ICD10-I70.0). Electronically Signed   By: Keith Rake M.D.   On: 05/19/2018 01:17     Critical Care performed:   .Critical Care Performed by: Gregor Hams, MD Authorized by: Gregor Hams, MD   Critical care provider statement:    Critical care time (minutes):  45   Critical care time was exclusive of:  Separately billable procedures and treating other patients (atrial fibrillation with rapid ventricular response. NSTEMI)   Critical care was  necessary to treat or prevent imminent or life-threatening deterioration of the following conditions:  Circulatory failure   Critical care was time spent personally by me on the following activities:  Development of treatment plan with patient or surrogate, discussions with consultants, evaluation of patient's response to treatment, examination of patient, obtaining history from patient or surrogate, ordering and performing treatments and interventions, ordering and review of laboratory studies, ordering and review of radiographic studies, pulse oximetry, re-evaluation of patient's condition and review of old charts     ____________________________________________   INITIAL IMPRESSION / ASSESSMENT AND PLAN / ED COURSE  As part of my medical decision making, I reviewed the following data within the electronic MEDICAL RECORD NUMBER   84 year old female presenting with above stated history of physical exam secondary to chest pain and shortness of breath. EKG revealed atrial fibrillation rapid ventricular response. Patient given metoprolol 5 mg IV with rate control achieved. Patient was given 324 mg of aspirin. Concern for possible ACS versus pulmonary emboli. Laboratory data reveal elevated troponin of 0.12. CT scan revealed no evidence of pulmonary emboli. Patient discussed with Dr. Jannifer Franklin for hospital admission further evaluation and management of atrial fibrillation with rapid ventricular response now rate control as well as NSTEMI    ____________________________________________  FINAL CLINICAL IMPRESSION(S) / ED DIAGNOSES  Final diagnoses:  NSTEMI (non-ST elevated myocardial infarction) ([place])  Atrial fibrillation with rapid ventricular response ([place])     MEDICATIONS GIVEN DURING THIS VISIT:  Medications  morphine 2 MG/ML injection 2 mg (2 mg Intravenous Given 05/19/18 0017)  metoprolol tartrate (LOPRESSOR) injection 5 mg (5 mg Intravenous Given 05/19/18 0025)  aspirin 81 MG chewable tablet  (324 mg  Given 05/19/18 0026)  iohexol (OMNIPAQUE) 350 MG/ML injection 75 mL (75 mLs Intravenous Contrast Given 05/19/18 0051)     ED Discharge Orders    None       Note:  This document was prepared using Dragon voice recognition software and may include unintentional dictation errors.    Gregor Hams, MD 05/19/18 [phone]

## 2018-05-18 NOTE — ED Triage Notes (Signed)
Patient to ED for shortness of breath x 3 days. Denies chest pain.

## 2018-05-19 ENCOUNTER — Inpatient Hospital Stay
Admit: 2018-05-19 | Discharge: 2018-05-19 | Disposition: A | Discharge status: Home / Self Care | Payer: PPO | Attending: Internal Medicine | Admitting: Internal Medicine

## 2018-05-19 ENCOUNTER — Other Ambulatory Visit: Payer: Self-pay

## 2018-05-19 ENCOUNTER — Encounter: Payer: Self-pay | Admitting: Radiology

## 2018-05-19 ENCOUNTER — Emergency Department: Payer: PPO

## 2018-05-19 DIAGNOSIS — M810 Age-related osteoporosis without current pathological fracture: Secondary | ICD-10-CM | POA: Diagnosis present

## 2018-05-19 DIAGNOSIS — E785 Hyperlipidemia, unspecified: Secondary | ICD-10-CM | POA: Diagnosis present

## 2018-05-19 DIAGNOSIS — I5023 Acute on chronic systolic (congestive) heart failure: Secondary | ICD-10-CM | POA: Diagnosis present

## 2018-05-19 DIAGNOSIS — J9601 Acute respiratory failure with hypoxia: Secondary | ICD-10-CM | POA: Diagnosis present

## 2018-05-19 DIAGNOSIS — R0602 Shortness of breath: Secondary | ICD-10-CM | POA: Diagnosis present

## 2018-05-19 DIAGNOSIS — Z803 Family history of malignant neoplasm of breast: Secondary | ICD-10-CM | POA: Diagnosis not present

## 2018-05-19 DIAGNOSIS — I2510 Atherosclerotic heart disease of native coronary artery without angina pectoris: Secondary | ICD-10-CM | POA: Diagnosis present

## 2018-05-19 DIAGNOSIS — I4891 Unspecified atrial fibrillation: Secondary | ICD-10-CM | POA: Diagnosis present

## 2018-05-19 DIAGNOSIS — Z853 Personal history of malignant neoplasm of breast: Secondary | ICD-10-CM | POA: Diagnosis not present

## 2018-05-19 DIAGNOSIS — Z79899 Other long term (current) drug therapy: Secondary | ICD-10-CM | POA: Diagnosis not present

## 2018-05-19 DIAGNOSIS — I214 Non-ST elevation (NSTEMI) myocardial infarction: Secondary | ICD-10-CM | POA: Diagnosis present

## 2018-05-19 DIAGNOSIS — Z9221 Personal history of antineoplastic chemotherapy: Secondary | ICD-10-CM | POA: Diagnosis not present

## 2018-05-19 DIAGNOSIS — I110 Hypertensive heart disease with heart failure: Secondary | ICD-10-CM | POA: Diagnosis present

## 2018-05-19 DIAGNOSIS — Z79811 Long term (current) use of aromatase inhibitors: Secondary | ICD-10-CM | POA: Diagnosis not present

## 2018-05-19 DIAGNOSIS — Z923 Personal history of irradiation: Secondary | ICD-10-CM | POA: Diagnosis not present

## 2018-05-19 DIAGNOSIS — I420 Dilated cardiomyopathy: Secondary | ICD-10-CM | POA: Diagnosis present

## 2018-05-19 DIAGNOSIS — I21A1 Myocardial infarction type 2: Secondary | ICD-10-CM | POA: Diagnosis present

## 2018-05-19 DIAGNOSIS — Z87891 Personal history of nicotine dependence: Secondary | ICD-10-CM | POA: Diagnosis not present

## 2018-05-19 DIAGNOSIS — H9190 Unspecified hearing loss, unspecified ear: Secondary | ICD-10-CM | POA: Diagnosis present

## 2018-05-19 DIAGNOSIS — K589 Irritable bowel syndrome without diarrhea: Secondary | ICD-10-CM | POA: Diagnosis present

## 2018-05-19 LAB — BASIC METABOLIC PANEL
Anion gap: 8 (ref 5–15)
BUN: 16 mg/dL (ref 8–23)
CHLORIDE: 107 mmol/L (ref 98–111)
CO2: 23 mmol/L (ref 22–32)
CREATININE: 0.81 mg/dL (ref 0.44–1.00)
Calcium: 8.6 mg/dL — ABNORMAL LOW (ref 8.9–10.3)
GFR calc non Af Amer: >60 mL/min (ref >60)
Glucose, Bld: 113 mg/dL — ABNORMAL HIGH (ref 70–99)
Potassium: 3.7 mmol/L (ref 3.5–5.1)
SODIUM: 138 mmol/L (ref 135–145)

## 2018-05-19 LAB — TSH: TSH: 5.789 u[IU]/mL — ABNORMAL HIGH (ref 0.350–4.500)

## 2018-05-19 LAB — HEPARIN LEVEL (UNFRACTIONATED)
Heparin Unfractionated: 0.17 IU/mL — ABNORMAL LOW (ref 0.30–0.70)
Heparin Unfractionated: 0.26 IU/mL — ABNORMAL LOW (ref 0.30–0.70)

## 2018-05-19 LAB — T4, FREE: FREE T4: 0.77 ng/dL — AB (ref 0.82–1.77)

## 2018-05-19 LAB — PROTIME-INR
INR: 1.04
Prothrombin Time: 13.5 seconds (ref 11.4–15.2)

## 2018-05-19 LAB — TROPONIN I
TROPONIN I: 0.12 ng/mL — AB (ref <0.03)
Troponin I: 0.15 ng/mL (ref <0.03)
Troponin I: 0.17 ng/mL (ref <0.03)
Troponin I: 0.19 ng/mL (ref <0.03)

## 2018-05-19 LAB — GLUCOSE, CAPILLARY: Glucose-Capillary: 97 mg/dL (ref 70–99)

## 2018-05-19 LAB — APTT: aPTT: 31 seconds (ref 24–36)

## 2018-05-19 MED ORDER — SODIUM CHLORIDE 0.9% FLUSH
3.0000 mL | Freq: Two times a day (BID) | INTRAVENOUS | Status: DC
  Administered 2018-05-19 (×2): 3 mL via INTRAVENOUS

## 2018-05-19 MED ORDER — ASPIRIN 81 MG PO CHEW
CHEWABLE_TABLET | ORAL | Status: AC
  Administered 2018-05-19: 324 mg
  Filled 2018-05-19: qty 4

## 2018-05-19 MED ORDER — SODIUM CHLORIDE 0.9 % WEIGHT BASED INFUSION
1.0000 mL/kg/h | INTRAVENOUS | Status: DC
  Administered 2018-05-20: 1 mL/kg/h via INTRAVENOUS

## 2018-05-19 MED ORDER — SODIUM CHLORIDE 0.9% FLUSH: 3.0000 mL | INTRAVENOUS | Status: DC | PRN

## 2018-05-19 MED ORDER — HEPARIN BOLUS VIA INFUSION
2000.0000 [IU] | Freq: Once | INTRAVENOUS | Status: AC
  Administered 2018-05-19: 2000 [IU] via INTRAVENOUS
  Filled 2018-05-19: qty 2000

## 2018-05-19 MED ORDER — BUPROPION HCL 100 MG PO TABS
100.0000 mg | ORAL_TABLET | Freq: Two times a day (BID) | ORAL | Status: DC
  Administered 2018-05-19 – 2018-05-21 (×4): 100 mg via ORAL
  Filled 2018-05-19 (×6): qty 1

## 2018-05-19 MED ORDER — METOPROLOL TARTRATE 5 MG/5ML IV SOLN
5.0000 mg | Freq: Once | INTRAVENOUS | Status: AC
  Administered 2018-05-19: 5 mg via INTRAVENOUS
  Filled 2018-05-19: qty 5

## 2018-05-19 MED ORDER — HEPARIN BOLUS VIA INFUSION
4000.0000 [IU] | Freq: Once | INTRAVENOUS | Status: AC
  Administered 2018-05-19: 4000 [IU] via INTRAVENOUS
  Filled 2018-05-19: qty 4000

## 2018-05-19 MED ORDER — ISOSORBIDE MONONITRATE ER 30 MG PO TB24
30.0000 mg | ORAL_TABLET | Freq: Every day | ORAL | Status: DC
  Administered 2018-05-19 – 2018-05-21 (×3): 30 mg via ORAL
  Filled 2018-05-19 (×3): qty 1

## 2018-05-19 MED ORDER — LETROZOLE 2.5 MG PO TABS
2.5000 mg | ORAL_TABLET | Freq: Every day | ORAL | Status: DC
  Administered 2018-05-19 – 2018-05-21 (×2): 2.5 mg via ORAL
  Filled 2018-05-19 (×3): qty 1

## 2018-05-19 MED ORDER — FUROSEMIDE 10 MG/ML IJ SOLN
20.0000 mg | Freq: Two times a day (BID) | INTRAMUSCULAR | Status: DC
  Administered 2018-05-19 – 2018-05-21 (×4): 20 mg via INTRAVENOUS
  Filled 2018-05-19 (×4): qty 2

## 2018-05-19 MED ORDER — OMEGA-3-ACID ETHYL ESTERS 1 G PO CAPS
1.0000 g | ORAL_CAPSULE | Freq: Every day | ORAL | Status: DC
  Administered 2018-05-19 – 2018-05-21 (×3): 1 g via ORAL
  Filled 2018-05-19 (×3): qty 1

## 2018-05-19 MED ORDER — SODIUM CHLORIDE 0.9 % WEIGHT BASED INFUSION
3.0000 mL/kg/h | INTRAVENOUS | Status: AC
  Administered 2018-05-20: 3 mL/kg/h via INTRAVENOUS

## 2018-05-19 MED ORDER — SODIUM CHLORIDE 0.9 % IV SOLN: 250.0000 mL | INTRAVENOUS | Status: DC | PRN

## 2018-05-19 MED ORDER — IOHEXOL 350 MG/ML SOLN
75.0000 mL | Freq: Once | INTRAVENOUS | Status: AC | PRN
  Administered 2018-05-19: 75 mL via INTRAVENOUS

## 2018-05-19 MED ORDER — ACETAMINOPHEN 650 MG RE SUPP: 650.0000 mg | Freq: Four times a day (QID) | RECTAL | Status: DC | PRN

## 2018-05-19 MED ORDER — DOCUSATE SODIUM 100 MG PO CAPS
100.0000 mg | ORAL_CAPSULE | Freq: Two times a day (BID) | ORAL | Status: DC
  Administered 2018-05-19 – 2018-05-21 (×4): 100 mg via ORAL
  Filled 2018-05-19 (×4): qty 1

## 2018-05-19 MED ORDER — ONDANSETRON HCL 4 MG PO TABS: 4.0000 mg | ORAL_TABLET | Freq: Four times a day (QID) | ORAL | Status: DC | PRN

## 2018-05-19 MED ORDER — VITAMIN B-12 100 MCG PO TABS
100.0000 ug | ORAL_TABLET | Freq: Every day | ORAL | Status: DC
  Administered 2018-05-19 – 2018-05-21 (×3): 100 ug via ORAL
  Filled 2018-05-19 (×3): qty 1

## 2018-05-19 MED ORDER — ASPIRIN 81 MG PO CHEW
81.0000 mg | CHEWABLE_TABLET | ORAL | Status: AC
  Administered 2018-05-20: 81 mg via ORAL
  Filled 2018-05-19: qty 1

## 2018-05-19 MED ORDER — ONDANSETRON HCL 4 MG/2ML IJ SOLN
4.0000 mg | Freq: Four times a day (QID) | INTRAMUSCULAR | Status: DC | PRN
  Administered 2018-05-21: 4 mg via INTRAVENOUS
  Filled 2018-05-19: qty 2

## 2018-05-19 MED ORDER — ASPIRIN EC 81 MG PO TBEC
81.0000 mg | DELAYED_RELEASE_TABLET | Freq: Every day | ORAL | Status: DC
  Administered 2018-05-19 – 2018-05-21 (×2): 81 mg via ORAL
  Filled 2018-05-19 (×2): qty 1

## 2018-05-19 MED ORDER — HEPARIN (PORCINE) IN NACL 100-0.45 UNIT/ML-% IJ SOLN
1150.0000 [IU]/h | INTRAMUSCULAR | Status: DC
  Administered 2018-05-19: 1150 [IU]/h via INTRAVENOUS
  Administered 2018-05-19: 800 [IU]/h via INTRAVENOUS
  Administered 2018-05-20 – 2018-05-21 (×2): 1150 [IU]/h via INTRAVENOUS
  Filled 2018-05-19 (×4): qty 250

## 2018-05-19 MED ORDER — INFLUENZA VAC SPLIT HIGH-DOSE 0.5 ML IM SUSY
0.5000 mL | PREFILLED_SYRINGE | INTRAMUSCULAR | Status: DC
  Filled 2018-05-19: qty 0.5

## 2018-05-19 MED ORDER — SODIUM CHLORIDE 0.9 % IV SOLN
INTRAVENOUS | Status: DC
  Administered 2018-05-19: 10:00:00 via INTRAVENOUS

## 2018-05-19 MED ORDER — VITAMIN D3 25 MCG (1000 UNIT) PO TABS
1000.0000 [IU] | ORAL_TABLET | Freq: Every day | ORAL | Status: DC
  Administered 2018-05-19 – 2018-05-21 (×3): 1000 [IU] via ORAL
  Filled 2018-05-19 (×3): qty 1

## 2018-05-19 MED ORDER — MORPHINE SULFATE (PF) 2 MG/ML IV SOLN
2.0000 mg | Freq: Once | INTRAVENOUS | Status: AC
  Administered 2018-05-19: 2 mg via INTRAVENOUS
  Filled 2018-05-19: qty 1

## 2018-05-19 MED ORDER — ACETAMINOPHEN 325 MG PO TABS
650.0000 mg | ORAL_TABLET | Freq: Four times a day (QID) | ORAL | Status: DC | PRN
  Administered 2018-05-21: 650 mg via ORAL
  Filled 2018-05-19 (×2): qty 2

## 2018-05-19 MED ORDER — HEPARIN BOLUS VIA INFUSION
1000.0000 [IU] | Freq: Once | INTRAVENOUS | Status: AC
  Administered 2018-05-19: 1000 [IU] via INTRAVENOUS
  Filled 2018-05-19: qty 1000

## 2018-05-19 MED ORDER — LISINOPRIL 5 MG PO TABS
2.5000 mg | ORAL_TABLET | Freq: Every day | ORAL | Status: DC
  Administered 2018-05-20 – 2018-05-21 (×2): 2.5 mg via ORAL
  Filled 2018-05-19 (×3): qty 1

## 2018-05-19 MED ORDER — GABAPENTIN 300 MG PO CAPS
300.0000 mg | ORAL_CAPSULE | Freq: Three times a day (TID) | ORAL | Status: DC | PRN
  Administered 2018-05-19: 300 mg via ORAL
  Filled 2018-05-19: qty 2

## 2018-05-19 NOTE — Progress Notes (Signed)
ANTICOAGULATION CONSULT NOTE - Initial Consult  Pharmacy Consult for heparin drip Indication: chest pain/ACS  Allergies  Allergen Reactions  . Iodine Nausea Only    Betadine okay   . Erythromycin Rash  . Penicillins Rash    Has patient had a PCN reaction causing immediate rash, facial/tongue/throat swelling, SOB or lightheadedness with hypotension: No Has patient had a PCN reaction causing severe rash involving mucus membranes or skin necrosis: No Has patient had a PCN reaction that required hospitalization: No Has patient had a PCN reaction occurring within the last 10 years: No If all of the above answers are "NO", then may proceed with Cephalosporin use.   . Shellfish Allergy Rash    Patient Measurements: Height: 5\' 7"  (170.2 cm) Weight: 150 lb (68 kg) IBW/kg (Calculated) : 61.6 Heparin Dosing Weight: 68 kg  Vital Signs: Temp: 98.7 F (37.1 C) (09/09 1928) Temp Source: Oral (09/09 1928) BP: 110/54 (09/09 1928) Pulse Rate: 67 (09/09 1928)  Labs: Recent Labs    05/18/18 2344 05/19/18 0403 05/19/18 1025 05/19/18 1154 05/19/18 1534 05/19/18 2248  HGB 10.7*  --   --   --   --   --   HCT 32.8*  --   --   --   --   --   PLT 339  --   --   --   --   --   APTT  --  31  --   --   --   --   LABPROT  --  13.5  --   --   --   --   INR  --  1.04  --   --   --   --   HEPARINUNFRC  --   --   --  0.17*  --  0.26*  CREATININE 0.81  --   --   --   --   --   TROPONINI 0.12* 0.15* 0.17*  --  0.19*  --     Estimated Creatinine Clearance: 52.1 mL/min (by C-G formula based on SCr of 0.81 mg/dL).   Medical History: Past Medical History:  Diagnosis Date  . Anxiety   . Anxiety   . Arthritis   . Breast cancer ([place]) 2014   bilateral invasive mammary carcinoma. with 36 rad tx.   . Breast cancer ([place])   . Cancer ([place])    breast  . Depression   . Depression   . Hard of hearing   . Hyperlipemia   . Hypertension   . IBS (irritable bowel syndrome)   . Memory loss   .  Osteoporosis   . Wears dentures    full upper, partial lower    Medications:  No anticoag in PTA meds  Assessment: Trop 0.12  HEPARIN COURSE DATE TIME HL CHANGE 9/9 0428 -- Start UFH - 4000 units bolus and 800 units/hr initial infusion rate 9/9 1154 0.17 2000 units Iv x 1 bolus and increase rate to 1000 units/hr  Goal of Therapy:  Heparin level 0.3-0.7 units/ml Monitor platelets by anticoagulation protocol: Yes   Plan:  09/09 @ 2330 HL 0.26 subtherapeutic. Will rebolus w/ heparin 1000 units IV x 1 and will increase rate to 1150 units/hr and will recheck HL @ 0800.  Tobie Lords, Pharm.D., BCPS Clinical Pharmacist 05/19/2018,11:24 PM

## 2018-05-19 NOTE — Care Management (Signed)
Echo was being performed when CM attempted to assess.  Patient for cardiac cath.

## 2018-05-19 NOTE — Progress Notes (Signed)
*  PRELIMINARY RESULTS* Echocardiogram 2D Echocardiogram has been performed.  Veronica Ellis 05/19/2018, 3:19 PM

## 2018-05-19 NOTE — Progress Notes (Signed)
ANTICOAGULATION CONSULT NOTE - Initial Consult  Pharmacy Consult for heparin drip Indication: chest pain/ACS  Allergies  Allergen Reactions  . Iodine Nausea Only    Betadine okay   . Erythromycin Rash  . Penicillins Rash    Has patient had a PCN reaction causing immediate rash, facial/tongue/throat swelling, SOB or lightheadedness with hypotension: No Has patient had a PCN reaction causing severe rash involving mucus membranes or skin necrosis: No Has patient had a PCN reaction that required hospitalization: No Has patient had a PCN reaction occurring within the last 10 years: No If all of the above answers are "NO", then may proceed with Cephalosporin use.   . Shellfish Allergy Rash    Patient Measurements: Height: 5\' 7"  (170.2 cm) Weight: 150 lb (68 kg) IBW/kg (Calculated) : 61.6 Heparin Dosing Weight: 68 kg  Vital Signs: Temp: 98 F (36.7 C) (09/09 0737) Temp Source: Oral (09/09 0737) BP: 113/53 (09/09 0737) Pulse Rate: 60 (09/09 0737)  Labs: Recent Labs    05/18/18 2344 05/19/18 0403 05/19/18 1025 05/19/18 1154  HGB 10.7*  --   --   --   HCT 32.8*  --   --   --   PLT 339  --   --   --   APTT  --  31  --   --   LABPROT  --  13.5  --   --   INR  --  1.04  --   --   HEPARINUNFRC  --   --   --  0.17*  CREATININE 0.81  --   --   --   TROPONINI 0.12* 0.15* 0.17*  --     Estimated Creatinine Clearance: 52.1 mL/min (by C-G formula based on SCr of 0.81 mg/dL).   Medical History: Past Medical History:  Diagnosis Date  . Anxiety   . Anxiety   . Arthritis   . Breast cancer ([place]) 2014   bilateral invasive mammary carcinoma. with 36 rad tx.   . Breast cancer ([place])   . Cancer ([place])    breast  . Depression   . Depression   . Hard of hearing   . Hyperlipemia   . Hypertension   . IBS (irritable bowel syndrome)   . Memory loss   . Osteoporosis   . Wears dentures    full upper, partial lower    Medications:  No anticoag in PTA meds  Assessment: Trop  0.12  HEPARIN COURSE DATE TIME HL CHANGE 9/9 0428 -- Start UFH - 4000 units bolus and 800 units/hr initial infusion rate 9/9 1154 0.17 2000 units Iv x 1 bolus and increase rate to 1000 units/hr  Goal of Therapy:  Heparin level 0.3-0.7 units/ml Monitor platelets by anticoagulation protocol: Yes   Plan:  RN confirms infusion with no interruption. 2000 units IV x 1 and increase rate to 1000 units/hr.   Laural Benes, Pharm.D., BCPS Clinical Pharmacist 05/19/2018,1:17 PM

## 2018-05-19 NOTE — Consult Note (Signed)
[facility] Consultation Note  Patient ID: Veronica Ellis, MRN: [id number], DOB/AGE: [birth year] 84 y.o. Admit date: 05/18/2018   Date of Consult: 05/19/2018 Primary Physician: Juline Patch, MD Primary Cardiologist: Nehemiah Massed  Chief Complaint:  Chief Complaint  Patient presents with  . Shortness of Breath   Reason for Consult: Elevated troponin with shortness of breath  HPI: 84 y.o. female with known previous history of left bundle branch block moderate dilated cardiomyopathy with ejection fraction of 30% previously on appropriate medication management including furosemide lisinopril.  The patient has done fairly well with appropriate medication management but has had some recent severe worsening of shortness of breath with physical activity relieved by rest and then coming more significant with resting shortness of breath.  She does have some mild lower extremity edema and pulmonary edema by exam but no evidence of apparent significant worsening valvular heart disease.  The patient when admitted to the hospital has had an EKG showing atrial fibrillation with rapid ventricular rate and bundle branch block as before which may be causing the majority of her symptoms.  There is an elevated troponin of 0.15 consistent with demand ischemia and/or possibly coronary artery disease.  The patient has had improvements of the symptoms with better heart rate control since admission.  There has been concerns in the past with a stress test showing a minimal inferior perfusion defect consistent with possible myocardial ischemia which may require further intervention and assessment.  Currently the patient does not have any worsening chest discomfort  Past Medical History:  Diagnosis Date  . Anxiety   . Anxiety   . Arthritis   . Breast cancer ([place]) 2014   bilateral invasive mammary carcinoma. with 36 rad tx.   . Breast cancer ([place])   . Cancer ([place])    breast  . Depression   . Depression    . Hard of hearing   . Hyperlipemia   . Hypertension   . IBS (irritable bowel syndrome)   . Memory loss   . Osteoporosis   . Wears dentures    full upper, partial lower      Surgical History:  Past Surgical History:  Procedure Laterality Date  . APPENDECTOMY    . BREAST BIOPSY Bilateral 2014  . BREAST SURGERY Bilateral   . CATARACT EXTRACTION W/PHACO Right 12/11/2017   Procedure: CATARACT EXTRACTION PHACO AND INTRAOCULAR LENS PLACEMENT ([place]) COMPLICATED RIGHT;  Surgeon: Leandrew Koyanagi, MD;  Location: [facility];  Service: Ophthalmology;  Laterality: Right;  . COLON SURGERY     12 in. removed due to polyps  . TUBAL LIGATION       Home Meds: Prior to Admission medications   Medication Sig Start Date End Date Taking? Authorizing Provider  buPROPion (WELLBUTRIN) 100 MG tablet Take 1 tablet (100 mg total) by mouth 2 (two) times daily. 12/26/17  Yes Juline Patch, MD  Cholecalciferol (VITAMIN D3) 10000 units TABS Take 1,000 Units by mouth daily.    Yes [provider]  gabapentin (NEURONTIN) 300 MG capsule Take 300-600 mg by mouth 3 (three) times daily as needed (pain). neurology 10/11/16  Yes [provider]  isosorbide mononitrate (IMDUR) 30 MG 24 hr tablet Take 1 tablet by mouth daily. 01/20/16  Yes [provider]  letrozole ([place]) 2.5 MG tablet TAKE 1 TABLET BY MOUTH EVERY DAY 11/29/17  Yes Lloyd Huger, MD  Omega-3 Fatty Acids (FISH OIL) 1000 MG CAPS Take 1 capsule by mouth daily.   Yes  [provider]  Probiotic Product (HEALTHY COLON PO) Take 1 capsule by mouth at bedtime.    Yes [provider]  vitamin B-12 (CYANOCOBALAMIN) 100 MCG tablet Take 100 mcg by mouth daily.   Yes [provider]    Inpatient Medications:  . aspirin EC  81 mg Oral Daily  . buPROPion  100 mg Oral BID  . cholecalciferol  1,000 Units Oral Daily  . docusate sodium  100 mg Oral BID  . furosemide  20 mg Intravenous Q12H  .  [START ON 05/20/2018] Influenza vac split quadrivalent PF  0.5 mL Intramuscular Tomorrow-1000  . isosorbide mononitrate  30 mg Oral Daily  . letrozole  2.5 mg Oral Daily  . lisinopril  2.5 mg Oral Daily  . omega-3 acid ethyl esters  1 g Oral Daily  . vitamin B-12  100 mcg Oral Daily   . sodium chloride 100 mL/hr at 05/19/18 0955  . heparin 800 Units/hr (05/19/18 5643)    Allergies:  Allergies  Allergen Reactions  . Iodine Nausea Only    Betadine okay   . Erythromycin Rash  . Penicillins Rash    Has patient had a PCN reaction causing immediate rash, facial/tongue/throat swelling, SOB or lightheadedness with hypotension: No Has patient had a PCN reaction causing severe rash involving mucus membranes or skin necrosis: No Has patient had a PCN reaction that required hospitalization: No Has patient had a PCN reaction occurring within the last 10 years: No If all of the above answers are "NO", then may proceed with Cephalosporin use.   . Shellfish Allergy Rash    Social History   Socioeconomic History  . Marital status: Widowed    Spouse name: Not on file  . Number of children: 5  . Years of education: some college  . Highest education level: 12th grade  Occupational History  . Occupation: Retired  Scientific laboratory technician  . Financial resource strain: Not hard at all  . Food insecurity:    Worry: Never true    Inability: Never true  . Transportation needs:    Medical: No    Non-medical: No  Tobacco Use  . Smoking status: Former Smoker    Packs/day: 1.00    Years: 60.00    Pack years: 60.00    Types: Cigarettes    Last attempt to quit: 12/2017    Years since quitting: 0.4  . Smokeless tobacco: Never Used  . Tobacco comment: smoking cessation materials not required  Substance and Sexual Activity  . Alcohol use: Not Currently    Alcohol/week: 0.0 standard drinks  . Drug use: No  . Sexual activity: Yes  Lifestyle  . Physical activity:    Days per week: 0 days    Minutes per  session: 0 min  . Stress: Rather much  Relationships  . Social connections:    Talks on phone: Patient refused    Gets together: Patient refused    Attends religious service: Patient refused    Active member of club or organization: Patient refused    Attends meetings of clubs or organizations: Patient refused    Relationship status: Widowed  . Intimate partner violence:    Fear of current or ex partner: No    Emotionally abused: No    Physically abused: No    Forced sexual activity: No  Other Topics Concern  . Not on file  Social History Narrative  . Not on file     Family History  Problem Relation Age  of Onset  . Breast cancer Daughter 17  . Breast cancer Daughter 3  . Cirrhosis Mother   . Lung cancer Father      Review of Systems Positive for shortness of breath chest discomfort Negative for: General:  chills, fever, night sweats or weight changes.  Cardiovascular: PND orthopnea syncope dizziness  Dermatological skin lesions rashes Respiratory: Cough congestion Urologic: Frequent urination urination at night and hematuria Abdominal: negative for nausea, vomiting, diarrhea, bright red blood per rectum, melena, or hematemesis Neurologic: negative for visual changes, and/or hearing changes  All other systems reviewed and are otherwise negative except as noted above.  Labs: Recent Labs    05/18/18 2344 05/19/18 0403  TROPONINI 0.12* 0.15*   Lab Results  Component Value Date   WBC 9.2 05/18/2018   HGB 10.7 (L) 05/18/2018   HCT 32.8 (L) 05/18/2018   MCV 75.2 (L) 05/18/2018   PLT 339 05/18/2018    Recent Labs  Lab 05/18/18 2344  NA 138  K 3.7  CL 107  CO2 23  BUN 16  CREATININE 0.81  CALCIUM 8.6*  GLUCOSE 113*   Lab Results  Component Value Date   CHOL 189 10/02/2016   HDL 47 10/02/2016   LDLCALC 115 (H) 10/02/2016   TRIG 135 10/02/2016   No results found for: DDIMER  Radiology/Studies:  Dg Chest 2 View  Result Date: 05/19/2018 CLINICAL  DATA:  Shortness S of breath for 3 days, history COPD, breast cancer, hypertension, former smoker EXAM: CHEST - 2 VIEW COMPARISON:  07/08/2017 FINDINGS: Enlargement of cardiac silhouette with pulmonary vascular congestion. Atherosclerotic calcification aorta. New diffuse interstitial infiltrates, greatest at bases, favor representing pulmonary edema Question tiny bibasilar effusions. Bones demineralized. IMPRESSION: Enlargement of cardiac silhouette with new pulmonary vascular congestion and scattered interstitial infiltrates favoring pulmonary edema. Electronically Signed   By: Lavonia [name] M.D.   On: 05/19/2018 00:16   Ct Angio Chest Pe W And/or Wo Contrast  Result Date: 05/19/2018 CLINICAL DATA:  Shortness of breath for 3 days. New onset atrial fibrillation. Chest pain, hypoxia. EXAM: CT ANGIOGRAPHY CHEST WITH CONTRAST TECHNIQUE: Multidetector CT imaging of the chest was performed using the standard protocol during bolus administration of intravenous contrast. Multiplanar CT image reconstructions and MIPs were obtained to evaluate the vascular anatomy. CONTRAST:  37mL OMNIPAQUE IOHEXOL 350 MG/ML SOLN COMPARISON:  Radiograph yesterday.  Chest CT 10/13/2014 FINDINGS: Cardiovascular: There are no filling defects within the pulmonary arteries to suggest pulmonary embolus. Diffuse atherosclerotic calcification of the thoracic aorta. Cannot assess for dissection given phase of contrast. Multi chamber cardiomegaly. There are coronary artery calcifications. No pericardial effusion. Mediastinum/Nodes: Multifocal mediastinal adenopathy including 16 mm lower paratracheal node, 13 mm upper paratracheal node, and 10 mm prevascular node. Small bilateral hilar nodes not enlarged by size criteria. Esophagus is nondistended. Thyroid gland is normal. Lungs/Pleura: Small left and small to moderate right pleural effusion. Peripheral septal thickening and basilar ground-glass opacities consistent with pulmonary edema. There is  peribronchial thickening felt to be congestive. Subpleural scarring in the anterior lungs likely postradiation change. 5 mm nodule in the superior segment of the left lower lobe is unchanged from prior exam and considered benign. Subpleural nodularity in the anterior right middle lobe, image 60 series 6, slightly more prominent than on prior exam. Some of the additional nodules on prior exam are obscured. Upper Abdomen: Bilateral adrenal nodularity unchanged from 2016 and considered benign. Splenic calcifications are chronic. There is an 11 mm gastrohepatic lymph node that is nonspecific. Musculoskeletal:  There are no acute or suspicious osseous abnormalities. Review of the MIP images confirms the above findings. IMPRESSION: 1. No pulmonary embolus. 2. CHF with cardiomegaly, pleural effusions and pulmonary edema. Peribronchial thickening felt to be congestive in secondary to pulmonary edema. 3. Multifocal mediastinal adenopathy, may be reactive and secondary to CHF. Given history of breast cancer, consider follow-up CT in 3 months after resolution of symptoms to ensure resolution. 4. Subpleural nodularity in the right middle lobe is slightly more prominent than in 5427 CT, of uncertain significance. Attention to this at follow-up recommended. 5. Indeterminate 11 mm gastrohepatic lymph node, this can also be reassessed on follow-up. Aortic Atherosclerosis (ICD10-I70.0). Electronically Signed   By: Keith Rake M.D.   On: 05/19/2018 01:17    EKG: Atrial fibrillation rapid ventricular rate and left bundle branch block  Weights: Filed Weights   05/18/18 2327 05/19/18 0311  Weight: 65.8 kg 68 kg     Physical Exam: Blood pressure (!) 113/53, pulse 60, temperature 98 F (36.7 C), temperature source Oral, resp. rate 19, height 5\' 7"  (1.702 m), weight 68 kg, SpO2 96 %. Body mass index is 23.49 kg/m. General: Well developed, well nourished, in no acute distress. Head eyes ears nose throat: Normocephalic,  atraumatic, sclera non-icteric, no xanthomas, nares are without discharge. No apparent thyromegaly and/or mass  Lungs: Normal respiratory effort.  Few wheezes, some rales, no rhonchi.  Heart: Irregular with normal S1 S2. no murmur gallop, no rub, PMI is normal size and placement, carotid upstroke normal without bruit, jugular venous pressure is normal Abdomen: Soft, non-tender, non-distended with normoactive bowel sounds. No hepatomegaly. No rebound/guarding. No obvious abdominal masses. Abdominal aorta is normal size without bruit Extremities: Trace edema. no cyanosis, no clubbing, no ulcers  Peripheral : 2+ bilateral upper extremity pulses, 2+ bilateral femoral pulses, 2+ bilateral dorsal pedal pulse Neuro: Alert and oriented. No facial asymmetry. No focal deficit. Moves all extremities spontaneously. Musculoskeletal: Normal muscle tone without kyphosis Psych:  Responds to questions appropriately with a normal affect.    Assessment: 84 year old female with atrial fibrillation with rapid ventricular rate causing shortness of breath and mild congestive heart failure symptoms and elevated troponin consistent with demand ischemia needing further evaluation and treatment options3  Plan: 1.  Continue heart rate control of atrial fibrillation with beta-blocker for heart rate goal between 60 and 90 bpm 2.  Few furosemide for further risk reduction of congestive heart failure and shortness of breath and pulmonary edema 3.  Proceed to cardiac catheterization to assess coronary anatomy and further treatment thereof is necessary as a cause of issues above and shortness of breath and elevated troponin and further treatment options after above.  Patient understands risk and benefits of cardiac catheterization.  This includes a possibility of death stroke heart attack infection bleeding or blood clot.  She is at low risk for conscious sedation  Signed, Corey Skains M.D. [facility]  Cardiology 05/19/2018, 10:25 AM

## 2018-05-19 NOTE — H&P (Signed)
Veronica Ellis is an 84 y.o. female.   Chief Complaint: Shortness of breath HPI: The patient with past medical history of breast cancer, hypertension and memory loss presents to the emergency department complaining of shortness of breath.  The patient states that she has been unable to catch her breath for the last 3 days.  Upon arrival she was found to be in atrial fibrillation with rapid ventricular rate.  She was given IV metoprolol which slowed her rate and return her to sinus rhythm.  Laboratory evaluation revealed elevated troponin.  It is unclear if the patient did or did not have chest pain but she states that she feels fine at this time.  Nonetheless, due to non-ST elevation ischemia the emergency department staff called the hospitalist service for admission.  Past Medical History:  Diagnosis Date  . Anxiety   . Anxiety   . Arthritis   . Breast cancer () 2014   bilateral invasive mammary carcinoma. with 36 rad tx.   . Breast cancer ([place])   . Cancer ([place])    breast  . Depression   . Depression   . Hard of hearing   . Hyperlipemia   . Hypertension   . IBS (irritable bowel syndrome)   . Memory loss   . Osteoporosis   . Wears dentures    full upper, partial lower    Past Surgical History:  Procedure Laterality Date  . APPENDECTOMY    . BREAST BIOPSY Bilateral 2014  . BREAST SURGERY Bilateral   . CATARACT EXTRACTION W/PHACO Right 12/11/2017   Procedure: CATARACT EXTRACTION PHACO AND INTRAOCULAR LENS PLACEMENT ([place]) COMPLICATED RIGHT;  Surgeon: Leandrew Koyanagi, MD;  Location: [facility];  Service: Ophthalmology;  Laterality: Right;  . COLON SURGERY     12 in. removed due to polyps  . TUBAL LIGATION      Family History  Problem Relation Age of Onset  . Breast cancer Daughter 13  . Breast cancer Daughter 5  . Cirrhosis Mother   . Lung cancer Father    Social History:  reports that she quit smoking about 5 months ago. Her smoking use included  cigarettes. She has a 60.00 pack-year smoking history. She has never used smokeless tobacco. She reports that she drank alcohol. She reports that she does not use drugs.  Allergies:  Allergies  Allergen Reactions  . Iodine Nausea Only    Betadine okay   . Erythromycin Rash  . Penicillins Rash    Has patient had a PCN reaction causing immediate rash, facial/tongue/throat swelling, SOB or lightheadedness with hypotension: No Has patient had a PCN reaction causing severe rash involving mucus membranes or skin necrosis: No Has patient had a PCN reaction that required hospitalization: No Has patient had a PCN reaction occurring within the last 10 years: No If all of the above answers are "NO", then may proceed with Cephalosporin use.   . Shellfish Allergy Rash    Medications Prior to Admission  Medication Sig Dispense Refill  . buPROPion (WELLBUTRIN) 100 MG tablet Take 1 tablet (100 mg total) by mouth 2 (two) times daily. 60 tablet 6  . Cholecalciferol (VITAMIN D3) 10000 units TABS Take 1,000 Units by mouth daily.     Marland Kitchen gabapentin (NEURONTIN) 300 MG capsule Take 300-600 mg by mouth 3 (three) times daily as needed (pain). neurology    . isosorbide mononitrate (IMDUR) 30 MG 24 hr tablet Take 1 tablet by mouth daily.    Marland Kitchen letrozole (FEMARA) 2.5 MG  tablet TAKE 1 TABLET BY MOUTH EVERY DAY 90 tablet 1  . Omega-3 Fatty Acids (FISH OIL) 1000 MG CAPS Take 1 capsule by mouth daily.    . Probiotic Product (HEALTHY COLON PO) Take 1 capsule by mouth at bedtime.     . vitamin B-12 (CYANOCOBALAMIN) 100 MCG tablet Take 100 mcg by mouth daily.      Results for orders placed or performed during the hospital encounter of 05/18/18 (from the past 48 hour(s))  Basic metabolic panel     Status: Abnormal   Collection Time: 05/18/18 11:44 PM  Result Value Ref Range   Sodium 138 135 - 145 mmol/L   Potassium 3.7 3.5 - 5.1 mmol/L   Chloride 107 98 - 111 mmol/L   CO2 23 22 - 32 mmol/L   Glucose, Bld 113 (H) 70  - 99 mg/dL   BUN 16 8 - 23 mg/dL   Creatinine, Ser 0.81 0.44 - 1.00 mg/dL   Calcium 8.6 (L) 8.9 - 10.3 mg/dL   GFR calc non Af Amer >60 >60 mL/min   GFR calc Af Amer >60 >60 mL/min    Comment: (NOTE) The eGFR has been calculated using the CKD EPI equation. This calculation has not been validated in all clinical situations. eGFR's persistently <60 mL/min signify possible Chronic Kidney Disease.    Anion gap 8 5 - 15    Comment: Performed at [facility], [address] [place], [place] [address]  CBC     Status: Abnormal   Collection Time: 05/18/18 11:44 PM  Result Value Ref Range   WBC 9.2 3.6 - 11.0 K/uL   RBC 4.36 3.80 - 5.20 MIL/uL   Hemoglobin 10.7 (L) 12.0 - 16.0 g/dL   HCT 32.8 (L) 35.0 - 47.0 %   MCV 75.2 (L) 80.0 - 100.0 fL   MCH 24.6 (L) 26.0 - 34.0 pg   MCHC 32.7 32.0 - 36.0 g/dL   RDW 17.5 (H) 11.5 - 14.5 %   Platelets 339 150 - 440 K/uL    Comment: Performed at [facility], [address] [place], [place] [address]  Troponin I     Status: Abnormal   Collection Time: 05/18/18 11:44 PM  Result Value Ref Range   Troponin I 0.12 (HH) <0.03 ng/mL    Comment: CRITICAL RESULT CALLED TO, READ BACK BY AND VERIFIED WITH KALA KEEN AT 0022 05/19/18.PMH Performed at [facility], [address] [place], [place] [address]   Protime-INR     Status: None   Collection Time: 05/19/18  4:03 AM  Result Value Ref Range   Prothrombin Time 13.5 11.4 - 15.2 seconds   INR 1.04     Comment: Performed at [facility], [address] [place], [place] [address]  APTT     Status: None   Collection Time: 05/19/18  4:03 AM  Result Value Ref Range   aPTT 31 24 - 36 seconds    Comment: Performed at [facility], [address] [place], [place] [address]  TSH     Status: Abnormal   Collection Time: 05/19/18  4:03 AM  Result Value Ref Range   TSH 5.789 (H) 0.350 - 4.500 uIU/mL    Comment: Performed by a 3rd Generation assay with a  functional sensitivity of <=0.01 uIU/mL. Performed at [facility], [address], [place], [place] [address]   Troponin I     Status: Abnormal   Collection Time: 05/19/18  4:03 AM  Result Value Ref Range   Troponin I 0.15 (HH) <0.03 ng/mL    Comment: CRITICAL VALUE NOTED. VALUE IS CONSISTENT WITH PREVIOUSLY REPORTED/CALLED VALUE.PMH Performed at [facility], [address] [place],  [address]    Dg Chest 2 View  Result Date: 05/19/2018 CLINICAL DATA:  Shortness S of breath for 3 days, history COPD, breast cancer, hypertension, former smoker EXAM: CHEST - 2 VIEW COMPARISON:  07/08/2017 FINDINGS: Enlargement of cardiac silhouette with pulmonary vascular congestion. Atherosclerotic calcification aorta. New diffuse interstitial infiltrates, greatest at bases, favor representing pulmonary edema Question tiny bibasilar effusions. Bones demineralized. IMPRESSION: Enlargement of cardiac silhouette with new pulmonary vascular congestion and scattered interstitial infiltrates favoring pulmonary edema. Electronically Signed   By: Lavonia [name] M.D.   On: 05/19/2018 00:16   Ct Angio Chest Pe W And/or Wo Contrast  Result Date: 05/19/2018 CLINICAL DATA:  Shortness of breath for 3 days. New onset atrial fibrillation. Chest pain, hypoxia. EXAM: CT ANGIOGRAPHY CHEST WITH CONTRAST TECHNIQUE: Multidetector CT imaging of the chest was performed using the standard protocol during bolus administration of intravenous contrast. Multiplanar CT image reconstructions and MIPs were obtained to evaluate the vascular anatomy. CONTRAST:  50m OMNIPAQUE IOHEXOL 350 MG/ML SOLN COMPARISON:  Radiograph yesterday.  Chest CT 10/13/2014 FINDINGS: Cardiovascular: There are no filling defects within the pulmonary arteries to suggest pulmonary embolus. Diffuse atherosclerotic calcification of the thoracic aorta. Cannot assess for dissection given phase of contrast. Multi chamber cardiomegaly. There are coronary  artery calcifications. No pericardial effusion. Mediastinum/Nodes: Multifocal mediastinal adenopathy including 16 mm lower paratracheal node, 13 mm upper paratracheal node, and 10 mm prevascular node. Small bilateral hilar nodes not enlarged by size criteria. Esophagus is nondistended. Thyroid gland is normal. Lungs/Pleura: Small left and small to moderate right pleural effusion. Peripheral septal thickening and basilar ground-glass opacities consistent with pulmonary edema. There is peribronchial thickening felt to be congestive. Subpleural scarring in the anterior lungs likely postradiation change. 5 mm nodule in the superior segment of the left lower lobe is unchanged from prior exam and considered benign. Subpleural nodularity in the anterior right middle lobe, image 60 series 6, slightly more prominent than on prior exam. Some of the additional nodules on prior exam are obscured. Upper Abdomen: Bilateral adrenal nodularity unchanged from 2016 and considered benign. Splenic calcifications are chronic. There is an 11 mm gastrohepatic lymph node that is nonspecific. Musculoskeletal: There are no acute or suspicious osseous abnormalities. Review of the MIP images confirms the above findings. IMPRESSION: 1. No pulmonary embolus. 2. CHF with cardiomegaly, pleural effusions and pulmonary edema. Peribronchial thickening felt to be congestive in secondary to pulmonary edema. 3. Multifocal mediastinal adenopathy, may be reactive and secondary to CHF. Given history of breast cancer, consider follow-up CT in 3 months after resolution of symptoms to ensure resolution. 4. Subpleural nodularity in the right middle lobe is slightly more prominent than in [address]CT, of uncertain significance. Attention to this at follow-up recommended. 5. Indeterminate 11 mm gastrohepatic lymph node, this can also be reassessed on follow-up. Aortic Atherosclerosis (ICD10-I70.0). Electronically Signed   By: MKeith RakeM.D.   On: 05/19/2018  01:17    Review of Systems  Constitutional: Negative for chills and fever.  HENT: Negative for sore throat and tinnitus.   Eyes: Negative for blurred vision and redness.  Respiratory: Positive for shortness of breath. Negative for cough.   Cardiovascular: Negative for chest pain, palpitations, orthopnea and PND.  Gastrointestinal: Negative for abdominal pain, diarrhea, nausea and vomiting.  Genitourinary: Negative for dysuria, frequency and urgency.  Musculoskeletal: Negative for joint pain and myalgias.  Skin: Negative for rash.       No lesions  Neurological: Negative for speech change, focal weakness and weakness.  Endo/Heme/Allergies: Does not bruise/bleed easily.       No temperature intolerance  Psychiatric/Behavioral: Negative for depression and suicidal ideas.    Blood pressure (!) 144/64, pulse 69, temperature 97.7 F (36.5 C), temperature source Oral, resp. rate 18, height 5' 7"  (1.702 m), weight 68 kg, SpO2 99 %. Physical Exam  Vitals reviewed. Constitutional: She is oriented to person, place, and time. She appears well-developed and well-nourished. No distress.  HENT:  Head: Normocephalic and atraumatic.  Mouth/Throat: Oropharynx is clear and moist.  Eyes: Pupils are equal, round, and reactive to light. Conjunctivae and EOM are normal. No scleral icterus.  Neck: Normal range of motion. Neck supple. No JVD present. No tracheal deviation present. No thyromegaly present.  Cardiovascular: Normal rate, regular rhythm and normal heart sounds. Exam reveals no gallop and no friction rub.  No murmur heard. Respiratory: Effort normal and breath sounds normal.  GI: Soft. Bowel sounds are normal. She exhibits no distension. There is no tenderness.  Genitourinary:  Genitourinary Comments: Deferred  Musculoskeletal: Normal range of motion. She exhibits no edema.  Lymphadenopathy:    She has no cervical adenopathy.  Neurological: She is alert and oriented to person, place, and  time. No cranial nerve deficit. She exhibits normal muscle tone.  Skin: Skin is warm and dry. No rash noted. No erythema.  Psychiatric: She has a normal mood and affect. Her behavior is normal. Judgment normal.  Memory is impaired      Assessment/Plan This is an 84 year old female admitted for NSTEMI. 1.  NSTEMI: Secondary to demand ischemia due to atrial fibrillation with rapid ventricular rate.  The latter has resolved following IV metoprolol 5 mg.  I started the patient on heparin drip.  Continue to follow cardiac biomarkers.  Monitor telemetry.  Consult cardiology. 2.  Hypertension: controlled; acceptable for age.  Continue to monitor.  Imdur daily 3.  History of breast cancer: Status post chemo and radiation.  Continue Femara 4.  DVT prophylaxis: Therapeutic anticoagulation 5.  GI prophylaxis: None The patient is a full code.  Time spent on admission orders and patient care approximately 45 minutes  Harrie Foreman, MD 05/19/2018, 6:29 AM

## 2018-05-19 NOTE — Plan of Care (Signed)
Rounded with MD, Heparin drip going at 10 ml/hr, cath schedule for tomorrow at 9 am. Pt resting in bed comfortably, complained of back pain medicated with gabapentin with good results. Will continue to monitor and assess.  Problem: Education: Goal: Knowledge of General Education information will improve Description Including pain rating scale, medication(s)/side effects and non-pharmacologic comfort measures Outcome: Progressing   Problem: Health Behavior/Discharge Planning: Goal: Ability to manage health-related needs will improve Outcome: Progressing   Problem: Clinical Measurements: Goal: Ability to maintain clinical measurements within normal limits will improve Outcome: Progressing Goal: Will remain free from infection Outcome: Progressing

## 2018-05-19 NOTE — ED Notes (Signed)
Date and time results received: 05/19/18 0020 (use smartphrase ".now" to insert current time)  Test: Troponin Critical Value: 0.12  Name of Provider Notified: Dr. Owens Shark  Orders Received? Or Actions Taken?:

## 2018-05-19 NOTE — Progress Notes (Signed)
Patient feels better. Vital signs are stable.  Labs reviewed. Echo is pending. This is an 84 year old female admitted for NSTEMI. 1.    Acute on chronic systolic CHF with elevated troponin secondary to demand ischemia due to atrial fibrillation with rapid ventricular rate.   Start CHF protocol, Lasix IV twice daily.  Cardiac work-up. Continue heparin drip, beta-blocker, possible cardiac cath per Dr. Nehemiah Massed. 2.  Hypertension: controlled; acceptable for age.  Continue to monitor.  Imdur daily 3.  History of breast cancer: Status post chemo and radiation.  Continue Femara  I discussed with the patient and RN.  Time spent about 28 minutes.

## 2018-05-19 NOTE — Plan of Care (Signed)

## 2018-05-19 NOTE — Progress Notes (Signed)
ANTICOAGULATION CONSULT NOTE - Initial Consult  Pharmacy Consult for heparin drip Indication: chest pain/ACS  Allergies  Allergen Reactions  . Iodine Nausea Only    Betadine okay   . Erythromycin Rash  . Penicillins Rash    Has patient had a PCN reaction causing immediate rash, facial/tongue/throat swelling, SOB or lightheadedness with hypotension: No Has patient had a PCN reaction causing severe rash involving mucus membranes or skin necrosis: No Has patient had a PCN reaction that required hospitalization: No Has patient had a PCN reaction occurring within the last 10 years: No If all of the above answers are "NO", then may proceed with Cephalosporin use.   . Shellfish Allergy Rash    Patient Measurements: Height: 5\' 7"  (170.2 cm) Weight: 150 lb (68 kg) IBW/kg (Calculated) : 61.6 Heparin Dosing Weight: 68 kg  Vital Signs: Temp: 97.7 F (36.5 C) (09/09 0313) Temp Source: Oral (09/09 0313) BP: 144/64 (09/09 0313) Pulse Rate: 69 (09/09 0313)  Labs: Recent Labs    05/18/18 2344  HGB 10.7*  HCT 32.8*  PLT 339  CREATININE 0.81  TROPONINI 0.12*    Estimated Creatinine Clearance: 52.1 mL/min (by C-G formula based on SCr of 0.81 mg/dL).   Medical History: Past Medical History:  Diagnosis Date  . Anxiety   . Anxiety   . Arthritis   . Breast cancer ([place]) 2014   bilateral invasive mammary carcinoma. with 36 rad tx.   . Breast cancer ([place])   . Cancer ([place])    breast  . Depression   . Depression   . Hard of hearing   . Hyperlipemia   . Hypertension   . IBS (irritable bowel syndrome)   . Memory loss   . Osteoporosis   . Wears dentures    full upper, partial lower    Medications:  No anticoag in PTA meds  Assessment: Trop 0.12  Goal of Therapy:  Heparin level 0.3-0.7 units/ml Monitor platelets by anticoagulation protocol: Yes   Plan:  4000 unit bolus and initial rate of 800 units/hr. First heparin level 8 hours after start of  infusion.  Davonn Flanery S 05/19/2018,3:27 AM

## 2018-05-20 ENCOUNTER — Encounter
Admission: EM | Disposition: A | Discharge status: Other Acute Inpatient Hospital | Payer: Self-pay | Source: Home / Self Care | Attending: Internal Medicine

## 2018-05-20 HISTORY — PX: LEFT HEART CATH AND CORONARY ANGIOGRAPHY: CATH118249

## 2018-05-20 LAB — LIPID PANEL
CHOLESTEROL: 189 mg/dL (ref 0–200)
HDL: 52 mg/dL (ref >40)
LDL Cholesterol: 113 mg/dL — ABNORMAL HIGH (ref 0–99)
Total CHOL/HDL Ratio: 3.6 RATIO
Triglycerides: 119 mg/dL (ref <150)
VLDL: 24 mg/dL (ref 0–40)

## 2018-05-20 LAB — BASIC METABOLIC PANEL
ANION GAP: 9 (ref 5–15)
BUN: 24 mg/dL — AB (ref 8–23)
CHLORIDE: 107 mmol/L (ref 98–111)
CO2: 24 mmol/L (ref 22–32)
Calcium: 8.5 mg/dL — ABNORMAL LOW (ref 8.9–10.3)
Creatinine, Ser: 0.85 mg/dL (ref 0.44–1.00)
GFR calc Af Amer: >60 mL/min (ref >60)
GLUCOSE: 110 mg/dL — AB (ref 70–99)
Potassium: 4.3 mmol/L (ref 3.5–5.1)
SODIUM: 140 mmol/L (ref 135–145)

## 2018-05-20 LAB — HEMOGLOBIN A1C
Hgb A1c MFr Bld: 5.4 % (ref 4.8–5.6)
Mean Plasma Glucose: 108 mg/dL

## 2018-05-20 LAB — HEPARIN LEVEL (UNFRACTIONATED)
HEPARIN UNFRACTIONATED: 0.39 [IU]/mL (ref 0.30–0.70)
HEPARIN UNFRACTIONATED: 0.14 [IU]/mL — AB (ref 0.30–0.70)
Heparin Unfractionated: 0.35 IU/mL (ref 0.30–0.70)

## 2018-05-20 LAB — ECHOCARDIOGRAM COMPLETE
HEIGHTINCHES: 67 in
WEIGHTICAEL: 2400 [oz_av]

## 2018-05-20 LAB — T3: T3 TOTAL: 96 ng/dL (ref 71–180)

## 2018-05-20 SURGERY — LEFT HEART CATH AND CORONARY ANGIOGRAPHY
Anesthesia: Moderate Sedation

## 2018-05-20 MED ORDER — DIPHENHYDRAMINE HCL 25 MG PO CAPS: 50.0000 mg | ORAL_CAPSULE | Freq: Once | ORAL | Status: AC

## 2018-05-20 MED ORDER — SODIUM CHLORIDE 0.9 % WEIGHT BASED INFUSION
1.0000 mL/kg/h | INTRAVENOUS | Status: AC
  Administered 2018-05-20: 1 mL/kg/h via INTRAVENOUS

## 2018-05-20 MED ORDER — MIDAZOLAM HCL 2 MG/2ML IJ SOLN
INTRAMUSCULAR | Status: AC
  Filled 2018-05-20: qty 2

## 2018-05-20 MED ORDER — NITROGLYCERIN 0.4 MG SL SUBL
SUBLINGUAL_TABLET | SUBLINGUAL | Status: AC
  Filled 2018-05-20: qty 1

## 2018-05-20 MED ORDER — NITROGLYCERIN 2 % TD OINT
TOPICAL_OINTMENT | TRANSDERMAL | Status: DC | PRN
  Administered 2018-05-20: 1 [in_us] via TOPICAL

## 2018-05-20 MED ORDER — FUROSEMIDE 10 MG/ML IJ SOLN
INTRAMUSCULAR | Status: AC
  Filled 2018-05-20: qty 4

## 2018-05-20 MED ORDER — IOPAMIDOL (ISOVUE-300) INJECTION 61%
INTRAVENOUS | Status: DC | PRN
  Administered 2018-05-20: 135 mL via INTRA_ARTERIAL

## 2018-05-20 MED ORDER — NITROGLYCERIN 0.4 MG SL SUBL
SUBLINGUAL_TABLET | SUBLINGUAL | Status: DC | PRN
  Administered 2018-05-20: .4 mg via SUBLINGUAL

## 2018-05-20 MED ORDER — HEPARIN (PORCINE) IN NACL 1000-0.9 UT/500ML-% IV SOLN
INTRAVENOUS | Status: AC
  Filled 2018-05-20: qty 1000

## 2018-05-20 MED ORDER — ONDANSETRON HCL 4 MG/2ML IJ SOLN: 4.0000 mg | Freq: Four times a day (QID) | INTRAMUSCULAR | Status: DC | PRN

## 2018-05-20 MED ORDER — MIDAZOLAM HCL 2 MG/2ML IJ SOLN
INTRAMUSCULAR | Status: DC | PRN
  Administered 2018-05-20: 1 mg via INTRAVENOUS

## 2018-05-20 MED ORDER — DIPHENHYDRAMINE HCL 50 MG/ML IJ SOLN
50.0000 mg | Freq: Once | INTRAMUSCULAR | Status: AC
  Administered 2018-05-20: 50 mg via INTRAVENOUS

## 2018-05-20 MED ORDER — ACETAMINOPHEN 325 MG PO TABS: 650.0000 mg | ORAL_TABLET | ORAL | Status: DC | PRN

## 2018-05-20 MED ORDER — FUROSEMIDE 10 MG/ML IJ SOLN
INTRAMUSCULAR | Status: DC | PRN
  Administered 2018-05-20: 40 mg via INTRAVENOUS

## 2018-05-20 MED ORDER — NITROGLYCERIN 2 % TD OINT
1.0000 [in_us] | TOPICAL_OINTMENT | Freq: Four times a day (QID) | TRANSDERMAL | Status: DC
  Administered 2018-05-20 – 2018-05-21 (×2): 1 [in_us] via TOPICAL
  Filled 2018-05-20 (×2): qty 1

## 2018-05-20 MED ORDER — HYDROCORTISONE NA SUCCINATE PF 100 MG IJ SOLR
200.0000 mg | Freq: Once | INTRAMUSCULAR | Status: AC
  Administered 2018-05-20: 200 mg via INTRAVENOUS
  Filled 2018-05-20: qty 4

## 2018-05-20 MED ORDER — METOPROLOL TARTRATE 25 MG PO TABS
25.0000 mg | ORAL_TABLET | Freq: Two times a day (BID) | ORAL | Status: DC
  Administered 2018-05-20 – 2018-05-21 (×2): 25 mg via ORAL
  Filled 2018-05-20 (×3): qty 1

## 2018-05-20 MED ORDER — NITROGLYCERIN 2 % TD OINT
TOPICAL_OINTMENT | TRANSDERMAL | Status: AC
  Filled 2018-05-20: qty 1

## 2018-05-20 MED ORDER — DIPHENHYDRAMINE HCL 50 MG/ML IJ SOLN
INTRAMUSCULAR | Status: AC
  Administered 2018-05-20: 50 mg via INTRAVENOUS
  Filled 2018-05-20: qty 1

## 2018-05-20 SURGICAL SUPPLY — 9 items
CATH INFINITI 5FR ANG PIGTAIL (CATHETERS) ×3 IMPLANT
CATH INFINITI 5FR JL4 (CATHETERS) ×3 IMPLANT
CATH INFINITI JR4 5F (CATHETERS) ×3 IMPLANT
DEVICE CLOSURE MYNXGRIP 5F (Vascular Products) ×3 IMPLANT
KIT MANI 3VAL PERCEP (MISCELLANEOUS) ×3 IMPLANT
NEEDLE PERC 18GX7CM (NEEDLE) ×3 IMPLANT
PACK CARDIAC CATH (CUSTOM PROCEDURE TRAY) ×3 IMPLANT
SHEATH AVANTI 5FR X 11CM (SHEATH) ×3 IMPLANT
WIRE GUIDERIGHT .035X150 (WIRE) ×3 IMPLANT

## 2018-05-20 NOTE — Discharge Summary (Addendum)
[facility] NAME: Veronica Ellis    MR#:  [id number]  DATE OF BIRTH:  05-01-1934  DATE OF ADMISSION:  05/18/2018   ADMITTING PHYSICIAN: Harrie Foreman, MD  DATE OF DISCHARGE: 05/20/2018 PRIMARY CARE PHYSICIAN: Juline Patch, MD   ADMISSION DIAGNOSIS:  NSTEMI (non-ST elevated myocardial infarction) ([place]) [I21.4] Atrial fibrillation with rapid ventricular response ([place]) [I48.91] DISCHARGE DIAGNOSIS:  Active Problems:   NSTEMI (non-ST elevated myocardial infarction) ([place])  SECONDARY DIAGNOSIS:   Past Medical History:  Diagnosis Date  . Anxiety   . Anxiety   . Arthritis   . Breast cancer ([place]) 2014   bilateral invasive mammary carcinoma. with 36 rad tx.   . Breast cancer ([place])   . Cancer ([place])    breast  . Depression   . Depression   . Hard of hearing   . Hyperlipemia   . Hypertension   . IBS (irritable bowel syndrome)   . Memory loss   . Osteoporosis   . Wears dentures    full upper, partial lower   HOSPITAL COURSE:  This is an 84 year old female admitted for NSTEMI. Acute respiratory failure with hypoxia due to acute on chronic systolic CHF.LV EF: 20% -   25% Continue oxygen by nasal cannula, on CHF protocol, Lasix IV twice daily.  Continue lisinopril and Lopressor.  Atrial fibrillation with rapid ventricular rate.  Continue heparin drip and Lopressor.  Non-STEMI. On heparin drip. Cardiac cath showed three-vessel CAD and need to CABG per Dr. Nehemiah Massed.  Hypertension: controlled;continue hypertension medications. History of breast cancer: Status post chemo and radiation. Continue Femara Discussed with Dr. Nehemiah Massed. DISCHARGE CONDITIONS:  , Transferred to [facility] when bed is available. CONSULTS OBTAINED:  Treatment Team:  Clabe Seal, Everette Rank, MD DRUG ALLERGIES:   Allergies  Allergen Reactions  . Iodine Nausea Only    Betadine okay   . Erythromycin Rash  . Penicillins  Rash    Has patient had a PCN reaction causing immediate rash, facial/tongue/throat swelling, SOB or lightheadedness with hypotension: No Has patient had a PCN reaction causing severe rash involving mucus membranes or skin necrosis: No Has patient had a PCN reaction that required hospitalization: No Has patient had a PCN reaction occurring within the last 10 years: No If all of the above answers are "NO", then may proceed with Cephalosporin use.   . Shellfish Allergy Rash   DISCHARGE MEDICATIONS:   Allergies as of 05/20/2018      Reactions   Iodine Nausea Only   Betadine okay   Erythromycin Rash   Penicillins Rash   Has patient had a PCN reaction causing immediate rash, facial/tongue/throat swelling, SOB or lightheadedness with hypotension: No Has patient had a PCN reaction causing severe rash involving mucus membranes or skin necrosis: No Has patient had a PCN reaction that required hospitalization: No Has patient had a PCN reaction occurring within the last 10 years: No If all of the above answers are "NO", then may proceed with Cephalosporin use.   Shellfish Allergy Rash      Medication List    TAKE these medications   buPROPion 100 MG tablet Commonly known as:  WELLBUTRIN Take 1 tablet (100 mg total) by mouth 2 (two) times daily.   Fish Oil 1000 MG Caps Take 1 capsule by mouth daily.   gabapentin 300 MG capsule Commonly known as:  NEURONTIN Take 300-600 mg by mouth 3 (three) times daily as needed (  pain). neurology   HEALTHY COLON PO Take 1 capsule by mouth at bedtime.   isosorbide mononitrate 30 MG 24 hr tablet Commonly known as:  IMDUR Take 1 tablet by mouth daily.   letrozole 2.5 MG tablet Commonly known as:  FEMARA TAKE 1 TABLET BY MOUTH EVERY DAY   vitamin B-12 100 MCG tablet Commonly known as:  CYANOCOBALAMIN Take 100 mcg by mouth daily.   Vitamin D3 10000 units Tabs Take 1,000 Units by mouth daily.        DISCHARGE INSTRUCTIONS:  See AVS.  If  you experience worsening of your admission symptoms, develop shortness of breath, life threatening emergency, suicidal or homicidal thoughts you must seek medical attention immediately by calling 911 or calling your MD immediately  if symptoms less severe.  You Must read complete instructions/literature along with all the possible adverse reactions/side effects for all the Medicines you take and that have been prescribed to you. Take any new Medicines after you have completely understood and accpet all the possible adverse reactions/side effects.   Please note  You were cared for by a hospitalist during your hospital stay. If you have any questions about your discharge medications or the care you received while you were in the hospital after you are discharged, you can call the unit and asked to speak with the hospitalist on call if the hospitalist that took care of you is not available. Once you are discharged, your primary care physician will handle any further medical issues. Please note that NO REFILLS for any discharge medications will be authorized once you are discharged, as it is imperative that you return to your primary care physician (or establish a relationship with a primary care physician if you do not have one) for your aftercare needs so that they can reassess your need for medications and monitor your lab values.    On the day of Discharge:  VITAL SIGNS:  Blood pressure 136/64, pulse 91, temperature 98 F (36.7 C), temperature source Oral, resp. rate 19, height 5\' 7"  (1.702 m), weight 66.9 kg, SpO2 96 %. PHYSICAL EXAMINATION:  GENERAL:  84 y.o.-year-old patient lying in the bed with no acute distress.  EYES: Pupils equal, round, reactive to light and accommodation. No scleral icterus. Extraocular muscles intact.  HEENT: Head atraumatic, normocephalic. Oropharynx and nasopharynx clear.  NECK:  Supple, no jugular venous distention. No thyroid enlargement, no tenderness.  LUNGS:  Normal breath sounds bilaterally, no wheezing, rales,rhonchi or crepitation. No use of accessory muscles of respiration.  CARDIOVASCULAR: S1, S2 normal. No murmurs, rubs, or gallops.  ABDOMEN: Soft, non-tender, non-distended. Bowel sounds present. No organomegaly or mass.  EXTREMITIES: No pedal edema, cyanosis, or clubbing.  NEUROLOGIC: Cranial nerves II through XII are intact. Muscle strength 5/5 in all extremities. Sensation intact. Gait not checked.  PSYCHIATRIC: The patient is alert and oriented x 3.  SKIN: No obvious rash, lesion, or ulcer.  DATA REVIEW:   CBC Recent Labs  Lab 05/18/18 2344  WBC 9.2  HGB 10.7*  HCT 32.8*  PLT 339    Chemistries  Recent Labs  Lab 05/20/18 0752  NA 140  K 4.3  CL 107  CO2 24  GLUCOSE 110*  BUN 24*  CREATININE 0.85  CALCIUM 8.5*     Microbiology Results  No results found for this or any previous visit.  RADIOLOGY:  No results found.   Management plans discussed with the patient, her daughters and son and they are in agreement.  CODE STATUS:  Full Code   TOTAL TIME TAKING CARE OF THIS PATIENT: 35 minutes.    Demetrios Loll M.D on 05/20/2018 at 2:55 PM  Between 7am to 6pm - Pager - [phone]  After 6pm go to [email or web address] - Proofreader  [facility]  Office  [phone]  CC: Primary care physician; Juline Patch, MD   Note: This dictation was prepared with Dragon dictation along with smaller phrase technology. Any transcriptional errors that result from this process are unintentional.

## 2018-05-20 NOTE — Progress Notes (Signed)
Chaplain was referred by staff. Initially a request for a Jake Bathe was stated. Nurse said they wanted to do a will. Chaplain visited Pt and found Pt was upset with the things she had mad some mistakes in how she had treated others in the will. She wanted Chaplain to help do her will. She needed it done very quickly. Chaplain let her know that we can not do a property will but will offer suggestion. Chaplain offered that she can write it here and have a witness to witness her signature. Once she is able she can have it done by an attorney. Chaplain prayed for unity  In the family and hope in surgery.    05/19/18 0318  Clinical Encounter Type  Visited With Patient and family together  Visit Type Follow-up  Referral From Nurse  Spiritual Encounters  Spiritual Needs Prayer;Other (Comment)  Advance Directives (For Healthcare)  Does Patient Have a Medical Advance Directive? No  Would patient like information on creating a medical advance directive? Yes (Inpatient - patient defers creating a medical advance directive at this time)  [place]  Does Patient Have a Mental Health Advance Directive? No  Would patient like information on creating a mental health advance directive? Yes (Inpatient - patient defers creating a mental health advance directive at this time)

## 2018-05-20 NOTE — Care Management Note (Signed)
Case Management Note  Patient Details  Name: Veronica Ellis MRN: [id number] Date of Birth: June 22, 1934  Subjective/Objective:                 CM consult for screening.  Patient lives at home and her son lives with her.  She does miss appointment with physicians due to her irritable bowel syndrome - not due to lack of transportation.  Her son takes her to her appointments, errands shops, cleans.  Patient is able to perform her bathing and dressing.  There is documentation indicating that cardiology is working on a transfer to [facility] for CABG.  This hospital is not in network with patient's insurance [employer]. Reference to call: [phone]. Marland Kitchen  Have notified Dr Bridgett Larsson and have paged cardiology. Informed patient and provided difference in/out network co pays.  In network- day 1-6 -295 dollars. Out Day 1-6- 500 dollars.  CM discussed that CM could not anticipate coverage for the physician and who is in network as do not know who will be providing the services.  Patient says "If he thinks [facility] is the best place to go then that is where I will go."  Asks that CM speak with cardiology to inquire about the reason for choosing [facility].  Have paged Dr Nehemiah Massed to discuss transfer  Action/Plan:  Provided family with contact information for Drs Making House Calls per request   Expected Discharge Date:  05/20/18               Expected Discharge Plan:     In-House Referral:     Discharge planning Services     Post Acute Care Choice:    Choice offered to:     DME Arranged:    DME Agency:     HH Arranged:    HH Agency:     Status of Service:     If discussed at [employer], dates discussed:    Additional Comments:  Katrina Stack, RN 05/20/2018, 4:08 PM

## 2018-05-20 NOTE — Progress Notes (Signed)
[facility] Encounter Note  Patient: Veronica Ellis / Admit Date: 05/18/2018 / Date of Encounter: 05/20/2018, 5:32 PM   Subjective: Patient with acute on chronic systolic dysfunction congestive heart failure with left bundle branch block and minimal elevation of troponin with hemodynamic stress and distress. Cardiac catheterization showing severe global LV systolic dysfunction with ejection fraction of 20% Moderate left main proximal left anterior descending artery and left circumflex atherosclerosis Significant ostial right coronary artery and proximal right coronary artery stenosis with damping of pressures with engagement of right coronary artery and significant distress and ischemia during procedure consistent with primary cause of current concerns Patient was given nitrates Lasix and beta-blocker for improvements of symptoms and currently stable  Review of Systems: Positive for: Shortness of breath chest pain Negative for: Vision change, hearing change, syncope, dizziness, nausea, vomiting,diarrhea, bloody stool, stomach pain, cough, congestion, diaphoresis, urinary frequency, urinary pain,skin lesions, skin rashes Others previously listed  Objective: Telemetry: Normal sinus rhythm with bundle branch block Physical Exam: Blood pressure (!) 115/54, pulse 73, temperature 98.6 F (37 C), temperature source Oral, resp. rate 19, height 5\' 7"  (1.702 m), weight 66.9 kg, SpO2 94 %. Body mass index is 23.09 kg/m. General: Well developed, well nourished, in no acute distress. Head: Normocephalic, atraumatic, sclera non-icteric, no xanthomas, nares are without discharge. Neck: No apparent masses Lungs: Normal respirations with no wheezes, no rhonchi, basilar rales , no crackles   Heart: Regular rate and rhythm, normal S1 S2, no murmur, no rub, no gallop, PMI is normal size and placement, carotid upstroke normal without bruit, jugular venous pressure normal Abdomen:  Soft, non-tender, non-distended with normoactive bowel sounds. No hepatosplenomegaly. Abdominal aorta is normal size without bruit Extremities: Trace edema, no clubbing, no cyanosis, no ulcers,  Peripheral: 2+ radial, 2+ femoral, 2+ dorsal pedal pulses Neuro: Alert and oriented. Moves all extremities spontaneously. Psych:  Responds to questions appropriately with a normal affect.   Intake/Output Summary (Last 24 hours) at 05/20/2018 1732 Last data filed at 05/20/2018 1654 Gross per 24 hour  Intake 1121.02 ml  Output 2200 ml  Net -1078.98 ml    Inpatient Medications:  . aspirin EC  81 mg Oral Daily  . buPROPion  100 mg Oral BID  . cholecalciferol  1,000 Units Oral Daily  . docusate sodium  100 mg Oral BID  . furosemide  20 mg Intravenous Q12H  . Influenza vac split quadrivalent PF  0.5 mL Intramuscular Tomorrow-1000  . isosorbide mononitrate  30 mg Oral Daily  . letrozole  2.5 mg Oral Daily  . lisinopril  2.5 mg Oral Daily  . metoprolol tartrate  25 mg Oral BID  . nitroGLYCERIN  1 inch Topical Q6H  . omega-3 acid ethyl esters  1 g Oral Daily  . vitamin B-12  100 mcg Oral Daily   Infusions:  . sodium chloride 1 mL/kg/hr (05/20/18 1319)  . heparin 1,150 Units/hr (05/20/18 1348)    Labs: Recent Labs    05/18/18 2344 05/20/18 0752  NA 138 140  K 3.7 4.3  CL 107 107  CO2 23 24  GLUCOSE 113* 110*  BUN 16 24*  CREATININE 0.81 0.85  CALCIUM 8.6* 8.5*   No results for input(s): AST, ALT, ALKPHOS, BILITOT, PROT, ALBUMIN in the last 72 hours. Recent Labs    05/18/18 2344  WBC 9.2  HGB 10.7*  HCT 32.8*  MCV 75.2*  PLT 339   Recent Labs    05/18/18 2344 05/19/18 0403 05/19/18 1025 05/19/18  1534  TROPONINI 0.12* 0.15* 0.17* 0.19*   Invalid input(s): POCBNP Recent Labs    05/19/18 0403  HGBA1C 5.4     Weights: Filed Weights   05/18/18 2327 05/19/18 0311 05/20/18 0349  Weight: 65.8 kg 68 kg 66.9 kg     Radiology/Studies:  Dg Chest 2 View  Result Date:  05/19/2018 CLINICAL DATA:  Shortness S of breath for 3 days, history COPD, breast cancer, hypertension, former smoker EXAM: CHEST - 2 VIEW COMPARISON:  07/08/2017 FINDINGS: Enlargement of cardiac silhouette with pulmonary vascular congestion. Atherosclerotic calcification aorta. New diffuse interstitial infiltrates, greatest at bases, favor representing pulmonary edema Question tiny bibasilar effusions. Bones demineralized. IMPRESSION: Enlargement of cardiac silhouette with new pulmonary vascular congestion and scattered interstitial infiltrates favoring pulmonary edema. Electronically Signed   By: Lavonia [name] M.D.   On: 05/19/2018 00:16   Ct Angio Chest Pe W And/or Wo Contrast  Result Date: 05/19/2018 CLINICAL DATA:  Shortness of breath for 3 days. New onset atrial fibrillation. Chest pain, hypoxia. EXAM: CT ANGIOGRAPHY CHEST WITH CONTRAST TECHNIQUE: Multidetector CT imaging of the chest was performed using the standard protocol during bolus administration of intravenous contrast. Multiplanar CT image reconstructions and MIPs were obtained to evaluate the vascular anatomy. CONTRAST:  67mL OMNIPAQUE IOHEXOL 350 MG/ML SOLN COMPARISON:  Radiograph yesterday.  Chest CT 10/13/2014 FINDINGS: Cardiovascular: There are no filling defects within the pulmonary arteries to suggest pulmonary embolus. Diffuse atherosclerotic calcification of the thoracic aorta. Cannot assess for dissection given phase of contrast. Multi chamber cardiomegaly. There are coronary artery calcifications. No pericardial effusion. Mediastinum/Nodes: Multifocal mediastinal adenopathy including 16 mm lower paratracheal node, 13 mm upper paratracheal node, and 10 mm prevascular node. Small bilateral hilar nodes not enlarged by size criteria. Esophagus is nondistended. Thyroid gland is normal. Lungs/Pleura: Small left and small to moderate right pleural effusion. Peripheral septal thickening and basilar ground-glass opacities consistent with pulmonary  edema. There is peribronchial thickening felt to be congestive. Subpleural scarring in the anterior lungs likely postradiation change. 5 mm nodule in the superior segment of the left lower lobe is unchanged from prior exam and considered benign. Subpleural nodularity in the anterior right middle lobe, image 60 series 6, slightly more prominent than on prior exam. Some of the additional nodules on prior exam are obscured. Upper Abdomen: Bilateral adrenal nodularity unchanged from 2016 and considered benign. Splenic calcifications are chronic. There is an 11 mm gastrohepatic lymph node that is nonspecific. Musculoskeletal: There are no acute or suspicious osseous abnormalities. Review of the MIP images confirms the above findings. IMPRESSION: 1. No pulmonary embolus. 2. CHF with cardiomegaly, pleural effusions and pulmonary edema. Peribronchial thickening felt to be congestive in secondary to pulmonary edema. 3. Multifocal mediastinal adenopathy, may be reactive and secondary to CHF. Given history of breast cancer, consider follow-up CT in 3 months after resolution of symptoms to ensure resolution. 4. Subpleural nodularity in the right middle lobe is slightly more prominent than in 3662 CT, of uncertain significance. Attention to this at follow-up recommended. 5. Indeterminate 11 mm gastrohepatic lymph node, this can also be reassessed on follow-up. Aortic Atherosclerosis (ICD10-I70.0). Electronically Signed   By: Keith Rake M.D.   On: 05/19/2018 01:17     Assessment and Recommendation  84 y.o. female with known acute on chronic systolic dysfunction congestive heart failure with progressive cardiac dysfunction over the last several years on appropriate medication management having anginal symptoms and minimal elevation of troponin consistent with non-ST elevation myocardial infarction and significant right coronary  artery stenosis likely exacerbating ischemia potentially from hypertension hyperlipidemia and  previous cancer radiation 1.  Continue medication management for treatment of acute on chronic systolic dysfunction heart failure including Lasix 2.  Metoprolol lisinopril for cardiomyopathy and myocardial ischemia 3.  Further discussion and consultation with interventional cardiology for possible PCI and stent placement of right coronary artery with further medical management of left coronary artery system 4.  Dual antiplatelet therapy 5.  Cardiac rehabilitation  Signed, Serafina Royals M.D. FACC

## 2018-05-20 NOTE — Progress Notes (Signed)
ANTICOAGULATION CONSULT NOTE - Initial Consult  Pharmacy Consult for heparin drip Indication: chest pain/ACS  Allergies  Allergen Reactions  . Iodine Nausea Only    Betadine okay   . Erythromycin Rash  . Penicillins Rash    Has patient had a PCN reaction causing immediate rash, facial/tongue/throat swelling, SOB or lightheadedness with hypotension: No Has patient had a PCN reaction causing severe rash involving mucus membranes or skin necrosis: No Has patient had a PCN reaction that required hospitalization: No Has patient had a PCN reaction occurring within the last 10 years: No If all of the above answers are "NO", then may proceed with Cephalosporin use.   Marland Kitchen Shellfish Allergy Rash    Patient Measurements: Height: 5\' 7"  (170.2 cm) Weight: 147 lb 6.4 oz (66.9 kg) IBW/kg (Calculated) : 61.6 Heparin Dosing Weight: 68 kg  Vital Signs: Temp: 98 F (36.7 C) (09/10 0849) Temp Source: Oral (09/10 0849) BP: 91/67 (09/10 0849) Pulse Rate: 69 (09/10 0849)  Labs: Recent Labs    05/18/18 2344 05/19/18 0403 05/19/18 1025 05/19/18 1154 05/19/18 1534 05/19/18 2248 05/20/18 0752  HGB 10.7*  --   --   --   --   --   --   HCT 32.8*  --   --   --   --   --   --   PLT 339  --   --   --   --   --   --   APTT  --  31  --   --   --   --   --   LABPROT  --  13.5  --   --   --   --   --   INR  --  1.04  --   --   --   --   --   HEPARINUNFRC  --   --   --  0.17*  --  0.26* 0.39  CREATININE 0.81  --   --   --   --   --  0.85  TROPONINI 0.12* 0.15* 0.17*  --  0.19*  --   --     Estimated Creatinine Clearance: 49.6 mL/min (by C-G formula based on SCr of 0.85 mg/dL).   Medical History: Past Medical History:  Diagnosis Date  . Anxiety   . Anxiety   . Arthritis   . Breast cancer ([place]) 2014   bilateral invasive mammary carcinoma. with 36 rad tx.   . Breast cancer ([place])   . Cancer ([place])    breast  . Depression   . Depression   . Hard of hearing   . Hyperlipemia   .  Hypertension   . IBS (irritable bowel syndrome)   . Memory loss   . Osteoporosis   . Wears dentures    full upper, partial lower    Medications:  No anticoag in PTA meds  Assessment: Trop 0.12  HEPARIN COURSE DATE TIME HL CHANGE 9/9 0428 -- Start UFH - 4000 units bolus and 800 units/hr initial infusion rate 9/9 1154 0.17 2000 units IV x 1 bolus and increase rate to 1000 units/hr 9/9 2348 0.26 1000 units IV x 1 bolus and increase rate to 1150 units/hr. 9/10 0752 0.39  Goal of Therapy:  Heparin level 0.3-0.7 units/ml Monitor platelets by anticoagulation protocol: Yes   Plan:  9/10 0752 HL therapeutic x 1. Continue current rate. Will recheck HL in 8 hours.   Laural Benes, Pharm.D., BCPS Clinical Pharmacist 05/20/2018,9:08 AM

## 2018-05-20 NOTE — Progress Notes (Signed)
Advanced Care Plan.  Purpose of Encounter: CODE STATUS Parties in Attendance: The patient, her 2 daughters and son, the Patient's Decisional Capacity: Yes. Medical Story: This is an 84 year old female with history of breast cancer, hypertension, hyperlipidemia and CHF. She is admitted for acute respiratory failure with hypoxia due to acute on chronic systolic CHF with elevated troponin secondary to demand ischemia due to atrial fibrillation with rapid ventricular rate.  Cardiac cath reported three-vessel CAD.  I discussed with the patient about her current condition, prognosis and CODE STATUS.  She stated that she want to be resuscitated and intubated to get her back. Plan:  Code Status: Full code Time spent discussing advance care planning: 18 minutes.

## 2018-05-20 NOTE — Progress Notes (Signed)
[place] [facility] [place] NAME: Veronica Ellis    MR#:  [id number]  DATE OF BIRTH:  Mar 17, 1934  SUBJECTIVE:  CHIEF COMPLAINT:   Chief Complaint  Patient presents with  . Shortness of Breath   Better SOB, no Chest pain. REVIEW OF SYSTEMS:  Review of Systems  Constitutional: Positive for malaise/fatigue. Negative for chills and fever.  HENT: Negative for sore throat.   Eyes: Negative for blurred vision and double vision.  Respiratory: Positive for shortness of breath. Negative for cough, hemoptysis, sputum production, wheezing and stridor.   Cardiovascular: Negative for chest pain, palpitations, orthopnea and leg swelling.  Gastrointestinal: Negative for abdominal pain, blood in stool, diarrhea, melena, nausea and vomiting.  Genitourinary: Negative for dysuria, flank pain and hematuria.  Musculoskeletal: Negative for back pain and joint pain.  Skin: Negative for rash.  Neurological: Negative for dizziness, sensory change, focal weakness, seizures, loss of consciousness, weakness and headaches.  Endo/Heme/Allergies: Negative for polydipsia.  Psychiatric/Behavioral: Negative for depression. The patient is not nervous/anxious.     DRUG ALLERGIES:   Allergies  Allergen Reactions  . Iodine Nausea Only    Betadine okay   . Erythromycin Rash  . Penicillins Rash    Has patient had a PCN reaction causing immediate rash, facial/tongue/throat swelling, SOB or lightheadedness with hypotension: No Has patient had a PCN reaction causing severe rash involving mucus membranes or skin necrosis: No Has patient had a PCN reaction that required hospitalization: No Has patient had a PCN reaction occurring within the last 10 years: No If all of the above answers are "NO", then may proceed with Cephalosporin use.   . Shellfish Allergy Rash   VITALS:  Blood pressure (!) 115/54, pulse 73, temperature 98.6 F (37 C), temperature source Oral, resp. rate 19,  height 5\' 7"  (1.702 m), weight 66.9 kg, SpO2 94 %. PHYSICAL EXAMINATION:  Physical Exam  Constitutional: She is oriented to person, place, and time. She appears well-nourished. No distress.  HENT:  Head: Normocephalic.  Mouth/Throat: Oropharynx is clear and moist.  Eyes: Pupils are equal, round, and reactive to light. Conjunctivae and EOM are normal. No scleral icterus.  Neck: Normal range of motion. Neck supple. No JVD present. No tracheal deviation present.  Cardiovascular: Normal rate, regular rhythm and normal heart sounds. Exam reveals no gallop.  No murmur heard. Pulmonary/Chest: Effort normal and breath sounds normal. No respiratory distress. She has no wheezes. She has no rales.  Abdominal: Soft. Bowel sounds are normal. She exhibits no distension. There is no tenderness. There is no rebound.  Musculoskeletal: Normal range of motion. She exhibits no edema or tenderness.  Neurological: She is alert and oriented to person, place, and time. No cranial nerve deficit.  Skin: No rash noted. No erythema.  Psychiatric: She has a normal mood and affect.   LABORATORY PANEL:  Female CBC Recent Labs  Lab 05/18/18 2344  WBC 9.2  HGB 10.7*  HCT 32.8*  PLT 339   ------------------------------------------------------------------------------------------------------------------ Chemistries  Recent Labs  Lab 05/20/18 0752  NA 140  K 4.3  CL 107  CO2 24  GLUCOSE 110*  BUN 24*  CREATININE 0.85  CALCIUM 8.5*   RADIOLOGY:  No results found. ASSESSMENT AND PLAN:  This is an 84 year old female admitted for NSTEMI. Acute respiratory failure with hypoxia due toacute on chronic systolic CHF.LV EF: 20% - 25% Continue oxygen by nasal cannula, on CHF protocol, Lasix IV twice daily.  Continue lisinopril and Lopressor.  Atrial fibrillation with rapid ventricular rate. Continueheparin drip and Lopressor.  Non-STEMI. On heparin drip. Dual antiplatelet therapy, Further discussion and  consultation with interventional cardiology for possible PCI and stent placement of right coronary artery with further medical management of left coronary artery system per Dr. Nehemiah Massed. Cardiac cath showed three-vessel CAD and need to CABG per Dr. Nehemiah Massed.  Hypertension: controlled;continue hypertension medications. History of breast cancer: Status post chemo and radiation. Continue Femara Discussed with Dr. Nehemiah Massed.  All the records are reviewed and case discussed with Care Management/Social Worker. Management plans discussed with the patient, her daughters and son,  and they are in agreement.  CODE STATUS: Full Code  TOTAL TIME TAKING CARE OF THIS PATIENT: 38 minutes.   More than 50% of the time was spent in counseling/coordination of care: YES  POSSIBLE D/C IN 2 DAYS, DEPENDING ON CLINICAL CONDITION.   Demetrios Loll M.D on 05/20/2018 at 6:43 PM  Between 7am to 6pm - Pager - [phone]  After 6pm go to [email or web address] - [facility]

## 2018-05-20 NOTE — Care Management (Signed)
CM make attempt to assess patient.  She was off the unit

## 2018-05-20 NOTE — Progress Notes (Signed)
Patient and family tearful, patient states "it's her time" and she needs to write out her will right now. Chaplain paged for support, patient and family requests rabbi. Chaplian made aware. Will continue to assess and monitor.

## 2018-05-20 NOTE — Progress Notes (Signed)
Chaplain received page patient wanted to speak to [place]. Chaplain stated she was with another patient and would contact the [place] after she is finished. Nurse stated "ok". Chaplain called Jake Bathe and he was not available today. Chaplain will follow up with nurse.

## 2018-05-20 NOTE — Progress Notes (Signed)
ANTICOAGULATION CONSULT NOTE - Initial Consult  Pharmacy Consult for heparin drip Indication: chest pain/ACS  Allergies  Allergen Reactions  . Iodine Nausea Only    Betadine okay   . Erythromycin Rash  . Penicillins Rash    Has patient had a PCN reaction causing immediate rash, facial/tongue/throat swelling, SOB or lightheadedness with hypotension: No Has patient had a PCN reaction causing severe rash involving mucus membranes or skin necrosis: No Has patient had a PCN reaction that required hospitalization: No Has patient had a PCN reaction occurring within the last 10 years: No If all of the above answers are "NO", then may proceed with Cephalosporin use.   Marland Kitchen Shellfish Allergy Rash    Patient Measurements: Height: 5\' 7"  (170.2 cm) Weight: 147 lb 6.4 oz (66.9 kg) IBW/kg (Calculated) : 61.6 Heparin Dosing Weight: 68 kg  Vital Signs: Temp: 98.6 F (37 C) (09/10 1610) Temp Source: Oral (09/10 1610) BP: 115/54 (09/10 1610) Pulse Rate: 73 (09/10 1610)  Labs: Recent Labs    05/18/18 2344 05/19/18 0403 05/19/18 1025 05/19/18 1154 05/19/18 1534 05/19/18 2248 05/20/18 0752  HGB 10.7*  --   --   --   --   --   --   HCT 32.8*  --   --   --   --   --   --   PLT 339  --   --   --   --   --   --   APTT  --  31  --   --   --   --   --   LABPROT  --  13.5  --   --   --   --   --   INR  --  1.04  --   --   --   --   --   HEPARINUNFRC  --   --   --  0.17*  --  0.26* 0.39  CREATININE 0.81  --   --   --   --   --  0.85  TROPONINI 0.12* 0.15* 0.17*  --  0.19*  --   --     Estimated Creatinine Clearance: 49.6 mL/min (by C-G formula based on SCr of 0.85 mg/dL).   Medical History: Past Medical History:  Diagnosis Date  . Anxiety   . Anxiety   . Arthritis   . Breast cancer ([place]) 2014   bilateral invasive mammary carcinoma. with 36 rad tx.   . Breast cancer ([place])   . Cancer ([place])    breast  . Depression   . Depression   . Hard of hearing   . Hyperlipemia   .  Hypertension   . IBS (irritable bowel syndrome)   . Memory loss   . Osteoporosis   . Wears dentures    full upper, partial lower    Medications:  No anticoag in PTA meds  Assessment: Trop 0.12  HEPARIN COURSE DATE TIME HL CHANGE 9/9 0428 -- Start UFH - 4000 units bolus and 800 units/hr initial infusion rate 9/9 1154 0.17 2000 units IV x 1 bolus and increase rate to 1000 units/hr 9/9 2348 0.26 1000 units IV x 1 bolus and increase rate to 1150 units/hr. 9/10 0752 0.39  Goal of Therapy:  Heparin level 0.3-0.7 units/ml Monitor platelets by anticoagulation protocol: Yes   Plan:  Patient is s/p cath. Found to have multi-vessel disease. Patient to transfer to [facility] for CABG. This is still being arranged. Heparin was resumed at 1348  at pervious rate which was therapeutic. Will check level in 8 hours.  Ramond Dial, Pharm.D., BCPS Clinical Pharmacist 05/20/2018,4:14 PM

## 2018-05-21 ENCOUNTER — Encounter: Payer: Self-pay | Admitting: Internal Medicine

## 2018-05-21 DIAGNOSIS — R21 Rash and other nonspecific skin eruption: Secondary | ICD-10-CM | POA: Diagnosis not present

## 2018-05-21 DIAGNOSIS — E7800 Pure hypercholesterolemia, unspecified: Secondary | ICD-10-CM | POA: Diagnosis not present

## 2018-05-21 DIAGNOSIS — I739 Peripheral vascular disease, unspecified: Secondary | ICD-10-CM | POA: Diagnosis not present

## 2018-05-21 DIAGNOSIS — R001 Bradycardia, unspecified: Secondary | ICD-10-CM | POA: Diagnosis not present

## 2018-05-21 DIAGNOSIS — I959 Hypotension, unspecified: Secondary | ICD-10-CM | POA: Diagnosis not present

## 2018-05-21 DIAGNOSIS — Z853 Personal history of malignant neoplasm of breast: Secondary | ICD-10-CM | POA: Diagnosis not present

## 2018-05-21 DIAGNOSIS — K580 Irritable bowel syndrome with diarrhea: Secondary | ICD-10-CM | POA: Diagnosis not present

## 2018-05-21 DIAGNOSIS — I428 Other cardiomyopathies: Secondary | ICD-10-CM | POA: Diagnosis not present

## 2018-05-21 DIAGNOSIS — E875 Hyperkalemia: Secondary | ICD-10-CM | POA: Diagnosis not present

## 2018-05-21 DIAGNOSIS — Z91041 Radiographic dye allergy status: Secondary | ICD-10-CM | POA: Diagnosis not present

## 2018-05-21 DIAGNOSIS — D649 Anemia, unspecified: Secondary | ICD-10-CM | POA: Insufficient documentation

## 2018-05-21 DIAGNOSIS — R1310 Dysphagia, unspecified: Secondary | ICD-10-CM | POA: Diagnosis not present

## 2018-05-21 DIAGNOSIS — I447 Left bundle-branch block, unspecified: Secondary | ICD-10-CM | POA: Diagnosis not present

## 2018-05-21 DIAGNOSIS — I700 Atherosclerosis of aorta: Secondary | ICD-10-CM | POA: Diagnosis not present

## 2018-05-21 DIAGNOSIS — I4891 Unspecified atrial fibrillation: Secondary | ICD-10-CM | POA: Insufficient documentation

## 2018-05-21 DIAGNOSIS — D509 Iron deficiency anemia, unspecified: Secondary | ICD-10-CM | POA: Insufficient documentation

## 2018-05-21 DIAGNOSIS — I110 Hypertensive heart disease with heart failure: Secondary | ICD-10-CM | POA: Diagnosis not present

## 2018-05-21 DIAGNOSIS — I10 Essential (primary) hypertension: Secondary | ICD-10-CM | POA: Diagnosis not present

## 2018-05-21 DIAGNOSIS — I5023 Acute on chronic systolic (congestive) heart failure: Secondary | ICD-10-CM | POA: Diagnosis not present

## 2018-05-21 DIAGNOSIS — I480 Paroxysmal atrial fibrillation: Secondary | ICD-10-CM | POA: Diagnosis not present

## 2018-05-21 DIAGNOSIS — R1312 Dysphagia, oropharyngeal phase: Secondary | ICD-10-CM | POA: Diagnosis not present

## 2018-05-21 DIAGNOSIS — C50919 Malignant neoplasm of unspecified site of unspecified female breast: Secondary | ICD-10-CM | POA: Diagnosis not present

## 2018-05-21 DIAGNOSIS — R918 Other nonspecific abnormal finding of lung field: Secondary | ICD-10-CM | POA: Diagnosis not present

## 2018-05-21 DIAGNOSIS — B009 Herpesviral infection, unspecified: Secondary | ICD-10-CM | POA: Diagnosis not present

## 2018-05-21 DIAGNOSIS — Z8719 Personal history of other diseases of the digestive system: Secondary | ICD-10-CM | POA: Diagnosis not present

## 2018-05-21 DIAGNOSIS — I6523 Occlusion and stenosis of bilateral carotid arteries: Secondary | ICD-10-CM | POA: Diagnosis not present

## 2018-05-21 DIAGNOSIS — Z539 Procedure and treatment not carried out, unspecified reason: Secondary | ICD-10-CM | POA: Diagnosis not present

## 2018-05-21 DIAGNOSIS — I2510 Atherosclerotic heart disease of native coronary artery without angina pectoris: Secondary | ICD-10-CM | POA: Diagnosis not present

## 2018-05-21 DIAGNOSIS — I5020 Unspecified systolic (congestive) heart failure: Secondary | ICD-10-CM | POA: Diagnosis not present

## 2018-05-21 DIAGNOSIS — R1314 Dysphagia, pharyngoesophageal phase: Secondary | ICD-10-CM | POA: Diagnosis not present

## 2018-05-21 DIAGNOSIS — I429 Cardiomyopathy, unspecified: Secondary | ICD-10-CM | POA: Diagnosis not present

## 2018-05-21 DIAGNOSIS — E782 Mixed hyperlipidemia: Secondary | ICD-10-CM | POA: Diagnosis not present

## 2018-05-21 DIAGNOSIS — Z8673 Personal history of transient ischemic attack (TIA), and cerebral infarction without residual deficits: Secondary | ICD-10-CM | POA: Diagnosis not present

## 2018-05-21 DIAGNOSIS — J029 Acute pharyngitis, unspecified: Secondary | ICD-10-CM | POA: Diagnosis not present

## 2018-05-21 DIAGNOSIS — I214 Non-ST elevation (NSTEMI) myocardial infarction: Secondary | ICD-10-CM | POA: Diagnosis not present

## 2018-05-21 DIAGNOSIS — M7989 Other specified soft tissue disorders: Secondary | ICD-10-CM | POA: Diagnosis not present

## 2018-05-21 DIAGNOSIS — I5021 Acute systolic (congestive) heart failure: Secondary | ICD-10-CM | POA: Diagnosis not present

## 2018-05-21 DIAGNOSIS — Z923 Personal history of irradiation: Secondary | ICD-10-CM | POA: Diagnosis not present

## 2018-05-21 DIAGNOSIS — Z87891 Personal history of nicotine dependence: Secondary | ICD-10-CM | POA: Diagnosis not present

## 2018-05-21 DIAGNOSIS — E785 Hyperlipidemia, unspecified: Secondary | ICD-10-CM | POA: Diagnosis not present

## 2018-05-21 DIAGNOSIS — R413 Other amnesia: Secondary | ICD-10-CM | POA: Diagnosis not present

## 2018-05-21 DIAGNOSIS — N179 Acute kidney failure, unspecified: Secondary | ICD-10-CM | POA: Diagnosis not present

## 2018-05-21 DIAGNOSIS — R946 Abnormal results of thyroid function studies: Secondary | ICD-10-CM | POA: Diagnosis not present

## 2018-05-21 LAB — T4: T4, Total: 6 ug/dL (ref 4.5–12.0)

## 2018-05-21 LAB — BASIC METABOLIC PANEL
Anion gap: 8 (ref 5–15)
BUN: 33 mg/dL — AB (ref 8–23)
CO2: 25 mmol/L (ref 22–32)
CREATININE: 0.99 mg/dL (ref 0.44–1.00)
Calcium: 8.4 mg/dL — ABNORMAL LOW (ref 8.9–10.3)
Chloride: 108 mmol/L (ref 98–111)
GFR calc Af Amer: 60 mL/min — ABNORMAL LOW (ref >60)
GFR calc non Af Amer: 52 mL/min — ABNORMAL LOW (ref >60)
GLUCOSE: 117 mg/dL — AB (ref 70–99)
Potassium: 3.3 mmol/L — ABNORMAL LOW (ref 3.5–5.1)
Sodium: 141 mmol/L (ref 135–145)

## 2018-05-21 LAB — MAGNESIUM: Magnesium: 1.9 mg/dL (ref 1.7–2.4)

## 2018-05-21 LAB — HEPARIN LEVEL (UNFRACTIONATED): Heparin Unfractionated: 0.39 IU/mL (ref 0.30–0.70)

## 2018-05-21 MED ORDER — LIDOCAINE HCL 1 % IJ SOLN
0.50 | INTRAMUSCULAR | Status: DC
Start: ? — End: 2018-05-21

## 2018-05-21 MED ORDER — BUPROPION HCL 100 MG PO TABS
100.00 | ORAL_TABLET | ORAL | Status: DC
Start: 2018-06-09 — End: 2018-05-21

## 2018-05-21 MED ORDER — ACETAMINOPHEN 325 MG PO TABS
650.00 | ORAL_TABLET | ORAL | Status: DC
Start: ? — End: 2018-05-21

## 2018-05-21 MED ORDER — LISINOPRIL 2.5 MG PO TABS: 2.5000 mg | ORAL_TABLET | Freq: Every day | ORAL | Status: DC

## 2018-05-21 MED ORDER — METOPROLOL TARTRATE 25 MG PO TABS: 25.0000 mg | ORAL_TABLET | Freq: Two times a day (BID) | ORAL | Status: DC

## 2018-05-21 MED ORDER — NITROGLYCERIN 0.4 MG SL SUBL
0.40 | SUBLINGUAL_TABLET | SUBLINGUAL | Status: DC
Start: ? — End: 2018-05-21

## 2018-05-21 MED ORDER — GENERIC EXTERNAL MEDICATION
50.00 | Status: DC
Start: 2018-05-21 — End: 2018-05-21

## 2018-05-21 MED ORDER — ASPIRIN 81 MG PO TBEC: 81.0000 mg | DELAYED_RELEASE_TABLET | Freq: Every day | ORAL | Status: DC

## 2018-05-21 MED ORDER — HEPARIN (PORCINE) IN NACL 100-0.45 UNIT/ML-% IJ SOLN: 1150.0000 [IU]/h | INTRAMUSCULAR | Status: DC

## 2018-05-21 MED ORDER — FUROSEMIDE 10 MG/ML IJ SOLN: 20.0000 mg | Freq: Two times a day (BID) | INTRAMUSCULAR | 0 refills | Status: DC

## 2018-05-21 MED ORDER — POTASSIUM CHLORIDE CRYS ER 20 MEQ PO TBCR
20.0000 meq | EXTENDED_RELEASE_TABLET | Freq: Every day | ORAL | Status: DC
  Administered 2018-05-21: 20 meq via ORAL
  Filled 2018-05-21: qty 1

## 2018-05-21 MED ORDER — NITROGLYCERIN 2 % TD OINT: 1.0000 [in_us] | TOPICAL_OINTMENT | Freq: Four times a day (QID) | TRANSDERMAL | 0 refills | Status: DC

## 2018-05-21 MED ORDER — LISINOPRIL 5 MG PO TABS
2.50 | ORAL_TABLET | ORAL | Status: DC
Start: 2018-05-22 — End: 2018-05-21

## 2018-05-21 MED ORDER — ASPIRIN EC 81 MG PO TBEC
81.00 | DELAYED_RELEASE_TABLET | ORAL | Status: DC
Start: 2018-06-03 — End: 2018-05-21

## 2018-05-21 MED ORDER — DIPHENHYDRAMINE HCL 50 MG/ML IJ SOLN
25.00 | INTRAMUSCULAR | Status: DC
Start: ? — End: 2018-05-21

## 2018-05-21 MED ORDER — LETROZOLE 2.5 MG PO TABS
2.50 | ORAL_TABLET | ORAL | Status: DC
Start: 2018-06-10 — End: 2018-05-21

## 2018-05-21 MED ORDER — HEPARIN (PORCINE) IN NACL 100-0.45 UNIT/ML-% IJ SOLN
1300.00 | INTRAMUSCULAR | Status: DC
Start: ? — End: 2018-05-21

## 2018-05-21 NOTE — Care Management (Signed)
Moving forward with transfer to tertiary facility.  Patient has chosen to proceed with [facility].  Dr Nehemiah Massed is coordinating

## 2018-05-21 NOTE — Discharge Summary (Signed)
[facility] NAME: Veronica Ellis    MR#:  [id number]  DATE OF BIRTH:  11-Aug-1934  DATE OF ADMISSION:  05/18/2018   ADMITTING PHYSICIAN: Harrie Foreman, MD  DATE OF DISCHARGE: 05/21/2018 PRIMARY CARE PHYSICIAN: Juline Patch, MD   ADMISSION DIAGNOSIS:  NSTEMI (non-ST elevated myocardial infarction) ([place]) [I21.4] Atrial fibrillation with rapid ventricular response ([place]) [I48.91] DISCHARGE DIAGNOSIS:  Active Problems:   NSTEMI (non-ST elevated myocardial infarction) ([place])  SECONDARY DIAGNOSIS:   Past Medical History:  Diagnosis Date  . Anxiety   . Anxiety   . Arthritis   . Breast cancer ([place]) 2014   bilateral invasive mammary carcinoma. with 36 rad tx.   . Breast cancer ([place])   . Cancer ([place])    breast  . Depression   . Depression   . Hard of hearing   . Hyperlipemia   . Hypertension   . IBS (irritable bowel syndrome)   . Memory loss   . Osteoporosis   . Wears dentures    full upper, partial lower   HOSPITAL COURSE:  This is an 84 year old female admitted for NSTEMI. Acute respiratory failure with hypoxia due to acute on chronic systolic CHF.LV EF: 20% -   25% Continue oxygen by nasal cannula, on CHF protocol, Lasix IV twice daily.  Continue lisinopril and Lopressor.  Atrial fibrillation with rapid ventricular rate.  Continue heparin drip and Lopressor.  Non-STEMI. On heparin drip. Cardiac cath showed three-vessel CAD. Transfer to higher risk facility for PCI and stent placement of ostial right coronary artery proximal right coronary artery and patient with severe LV systolic dysfunction and left bundle branch block per Dr. Nehemiah Massed.  Hypertension: controlled;continue hypertension medications. History of breast cancer: Status post chemo and radiation. Continue Femara DISCHARGE CONDITIONS:  , Transferred to [facility] when bed is available. CONSULTS OBTAINED:  Treatment Team:  Clabe Seal,  Everette Rank, MD DRUG ALLERGIES:   Allergies  Allergen Reactions  . Iodine Nausea Only    Betadine okay   . Erythromycin Rash  . Penicillins Rash    Has patient had a PCN reaction causing immediate rash, facial/tongue/throat swelling, SOB or lightheadedness with hypotension: No Has patient had a PCN reaction causing severe rash involving mucus membranes or skin necrosis: No Has patient had a PCN reaction that required hospitalization: No Has patient had a PCN reaction occurring within the last 10 years: No If all of the above answers are "NO", then may proceed with Cephalosporin use.   . Shellfish Allergy Rash   DISCHARGE MEDICATIONS:   Allergies as of 05/21/2018      Reactions   Iodine Nausea Only   Betadine okay   Erythromycin Rash   Penicillins Rash   Has patient had a PCN reaction causing immediate rash, facial/tongue/throat swelling, SOB or lightheadedness with hypotension: No Has patient had a PCN reaction causing severe rash involving mucus membranes or skin necrosis: No Has patient had a PCN reaction that required hospitalization: No Has patient had a PCN reaction occurring within the last 10 years: No If all of the above answers are "NO", then may proceed with Cephalosporin use.   Shellfish Allergy Rash      Medication List    TAKE these medications   aspirin 81 MG EC tablet Take 1 tablet (81 mg total) by mouth daily. Start taking on:  05/22/2018   buPROPion 100 MG tablet Commonly known as:  WELLBUTRIN Take 1 tablet (  100 mg total) by mouth 2 (two) times daily.   Fish Oil 1000 MG Caps Take 1 capsule by mouth daily.   furosemide 10 MG/ML injection Commonly known as:  LASIX Inject 2 mLs (20 mg total) into the vein every 12 (twelve) hours.   gabapentin 300 MG capsule Commonly known as:  NEURONTIN Take 300-600 mg by mouth 3 (three) times daily as needed (pain). neurology   HEALTHY COLON PO Take 1 capsule by mouth at bedtime.   heparin 100-0.45  UNIT/ML-% infusion Inject 1,150 Units/hr into the vein continuous.   isosorbide mononitrate 30 MG 24 hr tablet Commonly known as:  IMDUR Take 1 tablet by mouth daily.   letrozole 2.5 MG tablet Commonly known as:  FEMARA TAKE 1 TABLET BY MOUTH EVERY DAY   lisinopril 2.5 MG tablet Commonly known as:  PRINIVIL,ZESTRIL Take 1 tablet (2.5 mg total) by mouth daily. Start taking on:  05/22/2018   metoprolol tartrate 25 MG tablet Commonly known as:  LOPRESSOR Take 1 tablet (25 mg total) by mouth 2 (two) times daily.   nitroGLYCERIN 2 % ointment Commonly known as:  NITROGLYN Apply 1 inch topically every 6 (six) hours.   vitamin B-12 100 MCG tablet Commonly known as:  CYANOCOBALAMIN Take 100 mcg by mouth daily.   Vitamin D3 10000 units Tabs Take 1,000 Units by mouth daily.        DISCHARGE INSTRUCTIONS:  See AVS.  If you experience worsening of your admission symptoms, develop shortness of breath, life threatening emergency, suicidal or homicidal thoughts you must seek medical attention immediately by calling 911 or calling your MD immediately  if symptoms less severe.  You Must read complete instructions/literature along with all the possible adverse reactions/side effects for all the Medicines you take and that have been prescribed to you. Take any new Medicines after you have completely understood and accpet all the possible adverse reactions/side effects.   Please note  You were cared for by a hospitalist during your hospital stay. If you have any questions about your discharge medications or the care you received while you were in the hospital after you are discharged, you can call the unit and asked to speak with the hospitalist on call if the hospitalist that took care of you is not available. Once you are discharged, your primary care physician will handle any further medical issues. Please note that NO REFILLS for any discharge medications will be authorized once you are  discharged, as it is imperative that you return to your primary care physician (or establish a relationship with a primary care physician if you do not have one) for your aftercare needs so that they can reassess your need for medications and monitor your lab values.    On the day of Discharge:  VITAL SIGNS:  Blood pressure (!) 107/54, pulse 68, temperature 98 F (36.7 C), temperature source Oral, resp. rate 16, height 5\' 7"  (1.702 m), weight 66.8 kg, SpO2 96 %. PHYSICAL EXAMINATION:  GENERAL:  84 y.o.-year-old patient lying in the bed with no acute distress.  EYES: Pupils equal, round, reactive to light and accommodation. No scleral icterus. Extraocular muscles intact.  HEENT: Head atraumatic, normocephalic. Oropharynx and nasopharynx clear.  NECK:  Supple, no jugular venous distention. No thyroid enlargement, no tenderness.  LUNGS: Normal breath sounds bilaterally, no wheezing, rales,rhonchi or crepitation. No use of accessory muscles of respiration.  CARDIOVASCULAR: S1, S2 normal. No murmurs, rubs, or gallops.  ABDOMEN: Soft, non-tender, non-distended. Bowel sounds present. No  organomegaly or mass.  EXTREMITIES: No pedal edema, cyanosis, or clubbing.  NEUROLOGIC: Cranial nerves II through XII are intact. Muscle strength 5/5 in all extremities. Sensation intact. Gait not checked.  PSYCHIATRIC: The patient is alert and oriented x 3.  SKIN: No obvious rash, lesion, or ulcer.  DATA REVIEW:   CBC Recent Labs  Lab 05/18/18 2344  WBC 9.2  HGB 10.7*  HCT 32.8*  PLT 339    Chemistries  Recent Labs  Lab 05/21/18 0647  NA 141  K 3.3*  CL 108  CO2 25  GLUCOSE 117*  BUN 33*  CREATININE 0.99  CALCIUM 8.4*  MG 1.9     Microbiology Results  No results found for this or any previous visit.  RADIOLOGY:  No results found.   Management plans discussed with the patient, her daughters and son and they are in agreement.  CODE STATUS: Full Code   TOTAL TIME TAKING CARE OF THIS  PATIENT: 37 minutes.    Demetrios Loll M.D on 05/21/2018 at 11:35 AM  Between 7am to 6pm - Pager - [phone]  After 6pm go to [email or web address] - Proofreader  [facility]  Office  [phone]  CC: Primary care physician; Juline Patch, MD   Note: This dictation was prepared with Dragon dictation along with smaller phrase technology. Any transcriptional errors that result from this process are unintentional.

## 2018-05-21 NOTE — Progress Notes (Signed)
Chaplain followed up with patient regarding will. Pt was in pain and being attended. Pt begin to have life review. She was sorrowful about things she did in her life. She talked about the jealousy that was prevalent in her family and how that has manifested in the life of her children. Pt invited chaplain to please come back to continue to talk about her life. Pt will be transferred to [facility]. Chaplain will attempt to come back before her departure.    05/21/18 1100  Clinical Encounter Type  Visited With Patient  Visit Type Spiritual support  Referral From [place]

## 2018-05-21 NOTE — Progress Notes (Signed)
ANTICOAGULATION CONSULT NOTE - Initial Consult  Pharmacy Consult for heparin drip Indication: chest pain/ACS  Allergies  Allergen Reactions  . Iodine Nausea Only    Betadine okay   . Erythromycin Rash  . Penicillins Rash    Has patient had a PCN reaction causing immediate rash, facial/tongue/throat swelling, SOB or lightheadedness with hypotension: No Has patient had a PCN reaction causing severe rash involving mucus membranes or skin necrosis: No Has patient had a PCN reaction that required hospitalization: No Has patient had a PCN reaction occurring within the last 10 years: No If all of the above answers are "NO", then may proceed with Cephalosporin use.   Marland Kitchen Shellfish Allergy Rash    Patient Measurements: Height: 5\' 7"  (170.2 cm) Weight: 147 lb 3.2 oz (66.8 kg) IBW/kg (Calculated) : 61.6 Heparin Dosing Weight: 68 kg  Vital Signs: Temp: 98 F (36.7 C) (09/11 0341) Temp Source: Oral (09/11 0341) BP: 107/54 (09/11 0341) Pulse Rate: 68 (09/11 0341)  Labs: Recent Labs    05/18/18 2344 05/19/18 0403 05/19/18 1025  05/19/18 1534  05/20/18 0752 05/20/18 1600 05/20/18 2238 05/21/18 0647  HGB 10.7*  --   --   --   --   --   --   --   --   --   HCT 32.8*  --   --   --   --   --   --   --   --   --   PLT 339  --   --   --   --   --   --   --   --   --   APTT  --  31  --   --   --   --   --   --   --   --   LABPROT  --  13.5  --   --   --   --   --   --   --   --   INR  --  1.04  --   --   --   --   --   --   --   --   HEPARINUNFRC  --   --   --    < >  --    < > 0.39 0.14* 0.35 0.39  CREATININE 0.81  --   --   --   --   --  0.85  --   --   --   TROPONINI 0.12* 0.15* 0.17*  --  0.19*  --   --   --   --   --    < > = values in this interval not displayed.    Estimated Creatinine Clearance: 49.6 mL/min (by C-G formula based on SCr of 0.85 mg/dL).   Medical History: Past Medical History:  Diagnosis Date  . Anxiety   . Anxiety   . Arthritis   . Breast cancer ([place])  2014   bilateral invasive mammary carcinoma. with 36 rad tx.   . Breast cancer ([place])   . Cancer ([place])    breast  . Depression   . Depression   . Hard of hearing   . Hyperlipemia   . Hypertension   . IBS (irritable bowel syndrome)   . Memory loss   . Osteoporosis   . Wears dentures    full upper, partial lower    Medications:  No anticoag in PTA meds  Assessment: Trop 0.12  HEPARIN COURSE DATE  TIME HL CHANGE 9/9 0428 -- Start UFH - 4000 units bolus and 800 units/hr initial infusion rate 9/9 1154 0.17 2000 units IV x 1 bolus and increase rate to 1000 units/hr 9/9 2348 0.26 1000 units IV x 1 bolus and increase rate to 1150 units/hr. 9/10 0752 0.39 No change 9/10 1600 0.14 Just resumed post cath - has not been on 4 hours - recheck at appropriate time 9/10 2238 0.35 No change 9/11 0647 0.39 No change  Goal of Therapy:  Heparin level 0.3-0.7 units/ml Monitor platelets by anticoagulation protocol: Yes   Plan:  Patient is s/p cath. Found to have multi-vessel disease. Patient to transfer to [facility] for CABG. This is still being arranged. HL therapeutic. Continue current rate. Pharmacy will continue to follow HL and CBC daily per protocol.  Laural Benes, Pharm.D., BCPS Clinical Pharmacist 05/21/2018,7:27 AM

## 2018-05-21 NOTE — Progress Notes (Signed)
ANTICOAGULATION CONSULT NOTE - Initial Consult  Pharmacy Consult for heparin drip Indication: chest pain/ACS  Allergies  Allergen Reactions  . Iodine Nausea Only    Betadine okay   . Erythromycin Rash  . Penicillins Rash    Has patient had a PCN reaction causing immediate rash, facial/tongue/throat swelling, SOB or lightheadedness with hypotension: No Has patient had a PCN reaction causing severe rash involving mucus membranes or skin necrosis: No Has patient had a PCN reaction that required hospitalization: No Has patient had a PCN reaction occurring within the last 10 years: No If all of the above answers are "NO", then may proceed with Cephalosporin use.   Marland Kitchen Shellfish Allergy Rash    Patient Measurements: Height: 5\' 7"  (170.2 cm) Weight: 147 lb 6.4 oz (66.9 kg) IBW/kg (Calculated) : 61.6 Heparin Dosing Weight: 68 kg  Vital Signs: Temp: 99.7 F (37.6 C) (09/10 2005) Temp Source: Oral (09/10 2005) BP: 98/50 (09/10 2007) Pulse Rate: 73 (09/10 2007)  Labs: Recent Labs    05/18/18 2344 05/19/18 0403 05/19/18 1025  05/19/18 1534  05/20/18 0752 05/20/18 1600 05/20/18 2238  HGB 10.7*  --   --   --   --   --   --   --   --   HCT 32.8*  --   --   --   --   --   --   --   --   PLT 339  --   --   --   --   --   --   --   --   APTT  --  31  --   --   --   --   --   --   --   LABPROT  --  13.5  --   --   --   --   --   --   --   INR  --  1.04  --   --   --   --   --   --   --   HEPARINUNFRC  --   --   --    < >  --    < > 0.39 0.14* 0.35  CREATININE 0.81  --   --   --   --   --  0.85  --   --   TROPONINI 0.12* 0.15* 0.17*  --  0.19*  --   --   --   --    < > = values in this interval not displayed.    Estimated Creatinine Clearance: 49.6 mL/min (by C-G formula based on SCr of 0.85 mg/dL).   Medical History: Past Medical History:  Diagnosis Date  . Anxiety   . Anxiety   . Arthritis   . Breast cancer ([place]) 2014   bilateral invasive mammary carcinoma. with 36 rad  tx.   . Breast cancer ([place])   . Cancer ([place])    breast  . Depression   . Depression   . Hard of hearing   . Hyperlipemia   . Hypertension   . IBS (irritable bowel syndrome)   . Memory loss   . Osteoporosis   . Wears dentures    full upper, partial lower    Medications:  No anticoag in PTA meds  Assessment: Trop 0.12  HEPARIN COURSE DATE TIME HL CHANGE 9/9 0428 -- Start UFH - 4000 units bolus and 800 units/hr initial infusion rate 9/9 1154 0.17 2000 units IV x 1 bolus  and increase rate to 1000 units/hr 9/9 2348 0.26 1000 units IV x 1 bolus and increase rate to 1150 units/hr. 9/10 0752 0.39  Goal of Therapy:  Heparin level 0.3-0.7 units/ml Monitor platelets by anticoagulation protocol: Yes   Plan:  Patient is s/p cath. Found to have multi-vessel disease. Patient to transfer to [facility] for CABG. This is still being arranged. Heparin was resumed at 1348 at pervious rate which was therapeutic. Will check level in 8 hours.  09/11 @ 2230 HL 0.35 therapeutic. Will continue current rate and will recheck HL @ 0700.  Tobie Lords, Pharm.D., BCPS Clinical Pharmacist 05/21/2018,12:12 AM

## 2018-05-21 NOTE — Progress Notes (Signed)
[facility] Encounter Note  Patient: Veronica Ellis / Admit Date: 05/18/2018 / Date of Encounter: 05/21/2018, 7:53 AM   Subjective: Patient with acute on chronic systolic dysfunction congestive heart failure with left bundle branch block and minimal elevation of troponin with hemodynamic stress and distress. Cardiac catheterization showing severe global LV systolic dysfunction with ejection fraction of 20% Moderate left main proximal left anterior descending artery and left circumflex atherosclerosis Significant ostial right coronary artery and proximal right coronary artery stenosis with damping of pressures with engagement of right coronary artery and significant distress and ischemia during procedure consistent with primary cause of current concerns Patient was given nitrates Lasix and beta-blocker for improvements of symptoms and currently stable After further discussion and analysis with other interventionalists the patient would best benefit from PCI and stent placement of right coronary artery with medical management of left anterior descending artery.  Is understanding of the risks and benefits of PCI and stent placement of ostial right coronary artery as well as severe LV systolic dysfunction and the need for higher risk intervention Review of Systems: Positive for: Shortness of breath chest pain Negative for: Vision change, hearing change, syncope, dizziness, nausea, vomiting,diarrhea, bloody stool, stomach pain, cough, congestion, diaphoresis, urinary frequency, urinary pain,skin lesions, skin rashes Others previously listed  Objective: Telemetry: Normal sinus rhythm with bundle branch block Physical Exam: Blood pressure (!) 107/54, pulse 68, temperature 98 F (36.7 C), temperature source Oral, resp. rate 16, height 5\' 7"  (1.702 m), weight 66.8 kg, SpO2 96 %. Body mass index is 23.05 kg/m. General: Well developed, well nourished, in no acute distress. Head:  Normocephalic, atraumatic, sclera non-icteric, no xanthomas, nares are without discharge. Neck: No apparent masses Lungs: Normal respirations with no wheezes, no rhonchi, basilar rales , no crackles   Heart: Regular rate and rhythm, normal S1 S2, no murmur, no rub, no gallop, PMI is normal size and placement, carotid upstroke normal without bruit, jugular venous pressure normal Abdomen: Soft, non-tender, non-distended with normoactive bowel sounds. No hepatosplenomegaly. Abdominal aorta is normal size without bruit Extremities: Trace edema, no clubbing, no cyanosis, no ulcers,  Peripheral: 2+ radial, 2+ femoral, 2+ dorsal pedal pulses Neuro: Alert and oriented. Moves all extremities spontaneously. Psych:  Responds to questions appropriately with a normal affect.   Intake/Output Summary (Last 24 hours) at 05/21/2018 0753 Last data filed at 05/21/2018 0341 Gross per 24 hour  Intake 720 ml  Output 2700 ml  Net -1980 ml    Inpatient Medications:  . aspirin EC  81 mg Oral Daily  . buPROPion  100 mg Oral BID  . cholecalciferol  1,000 Units Oral Daily  . docusate sodium  100 mg Oral BID  . furosemide  20 mg Intravenous Q12H  . Influenza vac split quadrivalent PF  0.5 mL Intramuscular Tomorrow-1000  . isosorbide mononitrate  30 mg Oral Daily  . letrozole  2.5 mg Oral Daily  . lisinopril  2.5 mg Oral Daily  . metoprolol tartrate  25 mg Oral BID  . nitroGLYCERIN  1 inch Topical Q6H  . omega-3 acid ethyl esters  1 g Oral Daily  . vitamin B-12  100 mcg Oral Daily   Infusions:  . heparin 1,150 Units/hr (05/20/18 1348)    Labs: Recent Labs    05/20/18 0752 05/21/18 0647  NA 140 141  K 4.3 3.3*  CL 107 108  CO2 24 25  GLUCOSE 110* 117*  BUN 24* 33*  CREATININE 0.85 0.99  CALCIUM 8.5* 8.4*  No results for input(s): AST, ALT, ALKPHOS, BILITOT, PROT, ALBUMIN in the last 72 hours. Recent Labs    05/18/18 2344  WBC 9.2  HGB 10.7*  HCT 32.8*  MCV 75.2*  PLT 339   Recent Labs     05/18/18 2344 05/19/18 0403 05/19/18 1025 05/19/18 1534  TROPONINI 0.12* 0.15* 0.17* 0.19*   Invalid input(s): POCBNP Recent Labs    05/19/18 0403  HGBA1C 5.4     Weights: Filed Weights   05/19/18 0311 05/20/18 0349 05/21/18 0341  Weight: 68 kg 66.9 kg 66.8 kg     Radiology/Studies:  Dg Chest 2 View  Result Date: 05/19/2018 CLINICAL DATA:  Shortness S of breath for 3 days, history COPD, breast cancer, hypertension, former smoker EXAM: CHEST - 2 VIEW COMPARISON:  07/08/2017 FINDINGS: Enlargement of cardiac silhouette with pulmonary vascular congestion. Atherosclerotic calcification aorta. New diffuse interstitial infiltrates, greatest at bases, favor representing pulmonary edema Question tiny bibasilar effusions. Bones demineralized. IMPRESSION: Enlargement of cardiac silhouette with new pulmonary vascular congestion and scattered interstitial infiltrates favoring pulmonary edema. Electronically Signed   By: Lavonia [name] M.D.   On: 05/19/2018 00:16   Ct Angio Chest Pe W And/or Wo Contrast  Result Date: 05/19/2018 CLINICAL DATA:  Shortness of breath for 3 days. New onset atrial fibrillation. Chest pain, hypoxia. EXAM: CT ANGIOGRAPHY CHEST WITH CONTRAST TECHNIQUE: Multidetector CT imaging of the chest was performed using the standard protocol during bolus administration of intravenous contrast. Multiplanar CT image reconstructions and MIPs were obtained to evaluate the vascular anatomy. CONTRAST:  39mL OMNIPAQUE IOHEXOL 350 MG/ML SOLN COMPARISON:  Radiograph yesterday.  Chest CT 10/13/2014 FINDINGS: Cardiovascular: There are no filling defects within the pulmonary arteries to suggest pulmonary embolus. Diffuse atherosclerotic calcification of the thoracic aorta. Cannot assess for dissection given phase of contrast. Multi chamber cardiomegaly. There are coronary artery calcifications. No pericardial effusion. Mediastinum/Nodes: Multifocal mediastinal adenopathy including 16 mm lower  paratracheal node, 13 mm upper paratracheal node, and 10 mm prevascular node. Small bilateral hilar nodes not enlarged by size criteria. Esophagus is nondistended. Thyroid gland is normal. Lungs/Pleura: Small left and small to moderate right pleural effusion. Peripheral septal thickening and basilar ground-glass opacities consistent with pulmonary edema. There is peribronchial thickening felt to be congestive. Subpleural scarring in the anterior lungs likely postradiation change. 5 mm nodule in the superior segment of the left lower lobe is unchanged from prior exam and considered benign. Subpleural nodularity in the anterior right middle lobe, image 60 series 6, slightly more prominent than on prior exam. Some of the additional nodules on prior exam are obscured. Upper Abdomen: Bilateral adrenal nodularity unchanged from 2016 and considered benign. Splenic calcifications are chronic. There is an 11 mm gastrohepatic lymph node that is nonspecific. Musculoskeletal: There are no acute or suspicious osseous abnormalities. Review of the MIP images confirms the above findings. IMPRESSION: 1. No pulmonary embolus. 2. CHF with cardiomegaly, pleural effusions and pulmonary edema. Peribronchial thickening felt to be congestive in secondary to pulmonary edema. 3. Multifocal mediastinal adenopathy, may be reactive and secondary to CHF. Given history of breast cancer, consider follow-up CT in 3 months after resolution of symptoms to ensure resolution. 4. Subpleural nodularity in the right middle lobe is slightly more prominent than in 4970 CT, of uncertain significance. Attention to this at follow-up recommended. 5. Indeterminate 11 mm gastrohepatic lymph node, this can also be reassessed on follow-up. Aortic Atherosclerosis (ICD10-I70.0). Electronically Signed   By: Keith Rake M.D.   On: 05/19/2018 01:17  Assessment and Recommendation  84 y.o. female with known acute on chronic systolic dysfunction congestive  heart failure with progressive cardiac dysfunction over the last several years on appropriate medication management having anginal symptoms and minimal elevation of troponin consistent with non-ST elevation myocardial infarction and significant right coronary artery stenosis likely exacerbating ischemia potentially from hypertension hyperlipidemia and previous cancer radiation 1.  Continue medication management for treatment of acute on chronic systolic dysfunction heart failure including Lasix 2.  Metoprolol lisinopril for cardiomyopathy and myocardial ischemia 3.  Transfer to higher risk facility for PCI and stent placement of ostial right coronary artery proximal right coronary artery and patient with severe LV systolic dysfunction and left bundle branch block 4.  Dual antiplatelet therapy 5.  Cardiac rehabilitation  Signed, Serafina Royals M.D. FACC

## 2018-05-21 NOTE — Progress Notes (Signed)
Pt  Discharged to [facility] per MD order. Report called to facility. IV left intact with Heparin infusing at 11.5 ml/hr. Pt complains of nausea once on gurney with EMS. RN give zofran as ordered prn. I will continue to assess while here.

## 2018-05-22 MED ORDER — CULTURELLE PO CAPS
1.00 | ORAL_CAPSULE | ORAL | Status: DC
Start: 2018-06-10 — End: 2018-05-22

## 2018-05-22 MED ORDER — GENERIC EXTERNAL MEDICATION
50.00 | Status: DC
Start: 2018-05-22 — End: 2018-05-22

## 2018-05-22 MED ORDER — ATORVASTATIN CALCIUM 80 MG PO TABS
80.00 | ORAL_TABLET | ORAL | Status: DC
Start: 2018-06-10 — End: 2018-05-22

## 2018-05-22 MED ORDER — GABAPENTIN 300 MG PO CAPS
300.00 | ORAL_CAPSULE | ORAL | Status: DC
Start: 2018-06-09 — End: 2018-05-22

## 2018-05-22 MED ORDER — GENERIC EXTERNAL MEDICATION
6.25 | Status: DC
Start: 2018-05-23 — End: 2018-05-22

## 2018-05-22 MED ORDER — LISINOPRIL 5 MG PO TABS
5.00 | ORAL_TABLET | ORAL | Status: DC
Start: 2018-05-24 — End: 2018-05-22

## 2018-05-22 MED ORDER — FERROUS SULFATE 324 (65 FE) MG PO TBEC
324.00 | DELAYED_RELEASE_TABLET | ORAL | Status: DC
Start: 2018-05-27 — End: 2018-05-22

## 2018-05-23 MED ORDER — HEPARIN (PORCINE) IN NACL 100-0.45 UNIT/ML-% IJ SOLN
1000.00 | INTRAMUSCULAR | Status: DC
Start: ? — End: 2018-05-23

## 2018-05-26 MED ORDER — HEPARIN (PORCINE) IN NACL 100-0.45 UNIT/ML-% IJ SOLN
1100.00 | INTRAMUSCULAR | Status: DC
Start: ? — End: 2018-05-26

## 2018-05-30 ENCOUNTER — Inpatient Hospital Stay: Payer: Self-pay | Admitting: Family Medicine

## 2018-05-30 MED ORDER — ENALAPRIL MALEATE 2.5 MG PO TABS
2.50 | ORAL_TABLET | ORAL | Status: DC
Start: 2018-05-30 — End: 2018-05-30

## 2018-05-30 MED ORDER — TICAGRELOR 90 MG PO TABS
90.00 | ORAL_TABLET | ORAL | Status: DC
Start: 2018-05-30 — End: 2018-05-30

## 2018-05-30 MED ORDER — HEPARIN (PORCINE) IN NACL 100-0.45 UNIT/ML-% IJ SOLN
1200.00 | INTRAMUSCULAR | Status: DC
Start: ? — End: 2018-05-30

## 2018-05-30 MED ORDER — GENERIC EXTERNAL MEDICATION
6.25 | Status: DC
Start: 2018-05-30 — End: 2018-05-30

## 2018-05-30 MED ORDER — VALACYCLOVIR HCL 1 G PO TABS
500.00 | ORAL_TABLET | ORAL | Status: DC
Start: 2018-05-30 — End: 2018-05-30

## 2018-05-30 MED ORDER — GENERIC EXTERNAL MEDICATION
Status: DC
Start: ? — End: 2018-05-30

## 2018-05-30 MED ORDER — GENERIC EXTERNAL MEDICATION
125.00 | Status: DC
Start: 2018-06-03 — End: 2018-05-30

## 2018-06-02 MED ORDER — GENERIC EXTERNAL MEDICATION
7.50 | Status: DC
Start: 2018-06-02 — End: 2018-06-02

## 2018-06-02 MED ORDER — APIXABAN 5 MG PO TABS
5.00 | ORAL_TABLET | ORAL | Status: DC
Start: 2018-06-02 — End: 2018-06-02

## 2018-06-02 MED ORDER — METOPROLOL SUCCINATE ER 25 MG PO TB24
12.50 | ORAL_TABLET | ORAL | Status: DC
Start: 2018-06-03 — End: 2018-06-02

## 2018-06-02 MED ORDER — PANTOPRAZOLE SODIUM 40 MG PO TBEC
40.00 | DELAYED_RELEASE_TABLET | ORAL | Status: DC
Start: 2018-06-09 — End: 2018-06-02

## 2018-06-02 MED ORDER — MENTHOL 7.6 MG MT LOZG
1.00 | LOZENGE | OROMUCOSAL | Status: DC
Start: ? — End: 2018-06-02

## 2018-06-02 MED ORDER — SPIRONOLACTONE 25 MG PO TABS
25.00 | ORAL_TABLET | ORAL | Status: DC
Start: 2018-06-03 — End: 2018-06-02

## 2018-06-03 MED ORDER — HEPARIN (PORCINE) IN NACL 100-0.45 UNIT/ML-% IJ SOLN
1000.00 | INTRAMUSCULAR | Status: DC
Start: ? — End: 2018-06-03

## 2018-06-03 MED ORDER — POTASSIUM CHLORIDE ER 20 MEQ PO TBCR
20.00 | EXTENDED_RELEASE_TABLET | ORAL | Status: DC
Start: 2018-06-03 — End: 2018-06-03

## 2018-06-03 MED ORDER — LISINOPRIL 10 MG PO TABS
10.00 | ORAL_TABLET | ORAL | Status: DC
Start: 2018-06-04 — End: 2018-06-03

## 2018-06-03 MED ORDER — CLOPIDOGREL BISULFATE 75 MG PO TABS
75.00 | ORAL_TABLET | ORAL | Status: DC
Start: 2018-06-10 — End: 2018-06-03

## 2018-06-03 MED ORDER — GENERIC EXTERNAL MEDICATION
3.00 | Status: DC
Start: 2018-06-03 — End: 2018-06-03

## 2018-06-05 DIAGNOSIS — E875 Hyperkalemia: Secondary | ICD-10-CM | POA: Insufficient documentation

## 2018-06-06 DIAGNOSIS — N179 Acute kidney failure, unspecified: Secondary | ICD-10-CM | POA: Insufficient documentation

## 2018-06-09 MED ORDER — VALACYCLOVIR HCL 1 G PO TABS
500.00 | ORAL_TABLET | ORAL | Status: DC
Start: 2018-06-09 — End: 2018-06-09

## 2018-06-09 MED ORDER — GENERIC EXTERNAL MEDICATION
3.00 | Status: DC
Start: 2018-06-09 — End: 2018-06-09

## 2018-06-09 MED ORDER — LIDOCAINE 5 % EX PTCH
1.00 | MEDICATED_PATCH | CUTANEOUS | Status: DC
Start: 2018-06-10 — End: 2018-06-09

## 2018-06-09 MED ORDER — SENNOSIDES-DOCUSATE SODIUM 8.6-50 MG PO TABS
2.00 | ORAL_TABLET | ORAL | Status: DC
Start: 2018-06-09 — End: 2018-06-09

## 2018-06-10 ENCOUNTER — Encounter: Payer: Self-pay | Admitting: Family Medicine

## 2018-06-10 ENCOUNTER — Inpatient Hospital Stay: Payer: Self-pay | Admitting: Family Medicine

## 2018-06-10 ENCOUNTER — Ambulatory Visit (INDEPENDENT_AMBULATORY_CARE_PROVIDER_SITE_OTHER): Payer: PPO | Admitting: Family Medicine

## 2018-06-10 VITALS — BP 112/58 | HR 60 | Ht 67.0 in | Wt 144.0 lb

## 2018-06-10 DIAGNOSIS — Z09 Encounter for follow-up examination after completed treatment for conditions other than malignant neoplasm: Secondary | ICD-10-CM | POA: Diagnosis not present

## 2018-06-10 DIAGNOSIS — Z23 Encounter for immunization: Secondary | ICD-10-CM | POA: Diagnosis not present

## 2018-06-10 NOTE — Progress Notes (Signed)
Date:  06/10/2018   Name:  Veronica Ellis   DOB:  Sep 29, 1933   MRN:  [id number]   Chief Complaint: Follow-up (hospital d/c 06/09/18- d/c B/P meds and started warfarin and plavix/ stopped baby Aspirin) Pt was recently admitted to [facility] for NSTEMI on 05/18/18 and was discharged on 06/09/18. Transition of care call placed on 06/10/18.     Review of Systems  Constitutional: Negative.  Negative for chills, fatigue, fever and unexpected weight change.  HENT: Negative for congestion, ear discharge, ear pain, postnasal drip, rhinorrhea, sinus pressure, sneezing and sore throat.   Eyes: Negative for photophobia, pain, discharge, redness and itching.  Respiratory: Negative for cough, shortness of breath, wheezing and stridor.   Cardiovascular: Positive for chest pain. Negative for palpitations and leg swelling.  Gastrointestinal: Positive for nausea. Negative for abdominal pain, blood in stool, constipation, diarrhea and vomiting.  Endocrine: Negative for cold intolerance, heat intolerance, polydipsia, polyphagia and polyuria.  Genitourinary: Negative for dysuria, flank pain, frequency, hematuria, menstrual problem, pelvic pain, urgency, vaginal bleeding and vaginal discharge.  Musculoskeletal: Negative for arthralgias, back pain and myalgias.  Skin: Negative for rash.  Allergic/Immunologic: Negative for environmental allergies and food allergies.  Neurological: Negative for dizziness, weakness, light-headedness, numbness and headaches.  Hematological: Negative for adenopathy. Does not bruise/bleed easily.  Psychiatric/Behavioral: Negative for dysphoric mood. The patient is not nervous/anxious.     Patient Active Problem List   Diagnosis Date Noted  . NSTEMI (non-ST elevated myocardial infarction) ([place]) 05/19/2018  . Ataxia 12/31/2016  . Memory loss 12/31/2016  . Recurrent major depressive disorder, in partial remission ([place]) 12/31/2016  . Bipolar 1 disorder, mixed, moderate  ([place]) 12/25/2016  . Bilateral breast cancer ([place]) 02/20/2016  . 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 Cerebrovascular disease 02/16/2015  . Anxiety 02/16/2015  . Breast lump 02/16/2015  . 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[place]) 02/16/2015  . 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 Routine general medical examination at a health care facility 02/16/2015  . Recurrent major depressive episodes ([place]) 02/16/2015  . Below normal amount of sodium in the blood 02/16/2015  . Pre-operative cardiovascular examination 02/16/2015    Allergies  Allergen Reactions  . Iodine Nausea Only    Betadine okay   . Erythromycin Rash  . Penicillins Rash    Has patient had a PCN reaction causing immediate rash, facial/tongue/throat swelling, SOB or lightheadedness with hypotension: No Has patient had a PCN reaction causing severe rash involving mucus membranes or skin necrosis: No Has patient had a PCN reaction that required hospitalization: No Has patient had a PCN reaction occurring within the last 10 years: No If all of the above answers are "NO", then may proceed with Cephalosporin use.   Marland Kitchen Shellfish Allergy Rash    Past Surgical History:  Procedure Laterality Date  . APPENDECTOMY    . BREAST BIOPSY Bilateral 2014  . BREAST SURGERY Bilateral   . CATARACT EXTRACTION W/PHACO Right 12/11/2017   Procedure: CATARACT EXTRACTION PHACO AND INTRAOCULAR LENS PLACEMENT ([place]) COMPLICATED RIGHT;  Surgeon: Leandrew Koyanagi, MD;  Location: [facility];  Service: Ophthalmology;  Laterality: Right;  . COLON SURGERY     12 in. removed due to polyps  . LEFT HEART CATH AND CORONARY ANGIOGRAPHY N/A 05/20/2018   Procedure: LEFT HEART CATH AND CORONARY ANGIOGRAPHY;  Surgeon: Corey Skains, MD;  Location: [facility];  Service: Cardiovascular;  Laterality: N/A;  . TUBAL LIGATION      Social History   Tobacco  Use  . Smoking status: Former Smoker    Packs/day: 1.00    Years:  60.00    Pack years: 60.00    Types: Cigarettes    Last attempt to quit: 12/2017    Years since quitting: 0.5  . Smokeless tobacco: Never Used  . Tobacco comment: smoking cessation materials not required  Substance Use Topics  . Alcohol use: Not Currently    Alcohol/week: 0.0 standard drinks  . Drug use: No     Medication list has been reviewed and updated.  Current Meds  Medication Sig  . buPROPion (WELLBUTRIN) 100 MG tablet Take 1 tablet (100 mg total) by mouth 2 (two) times daily.  Marland Kitchen gabapentin (NEURONTIN) 300 MG capsule Take 300-600 mg by mouth 3 (three) times daily as needed (pain). neurology  . letrozole (FEMARA) 2.5 MG tablet TAKE 1 TABLET BY MOUTH EVERY DAY  . nitroGLYCERIN (NITROGLYN) 2 % ointment Apply 1 inch topically every 6 (six) hours.  . pantoprazole (PROTONIX) 40 MG tablet Take 1 tablet by mouth 2 (two) times daily.  Marland Kitchen saccharomyces boulardii (FLORASTOR) 250 MG capsule Take by mouth.  . valACYclovir (VALTREX) 500 MG tablet Take 1 tablet by mouth 2 (two) times daily.  Marland Kitchen warfarin (COUMADIN) 3 MG tablet Take 1 tablet by mouth at bedtime.  . [DISCONTINUED] aspirin EC 81 MG EC tablet Take 1 tablet (81 mg total) by mouth daily.    PHQ 2/9 Scores 03/03/2018 12/26/2017 02/25/2017 02/13/2016  PHQ - 2 Score 4 1 0 0  PHQ- 9 Score 11 2 - -    Physical Exam  Constitutional: She is oriented to person, place, and time. She appears well-developed and well-nourished.  HENT:  Head: Normocephalic.  Right Ear: External ear normal.  Left Ear: External ear normal.  Mouth/Throat: Oropharynx is clear and moist.  Eyes: Pupils are equal, round, and reactive to light. Conjunctivae and EOM are normal. Lids are everted and swept, no foreign bodies found. Left eye exhibits no hordeolum. No foreign body present in the left eye. Right conjunctiva is not injected. Left conjunctiva is not injected. No scleral icterus.  Neck: Normal range of motion. Neck supple. No JVD present. No tracheal  deviation present. No thyromegaly present.  Cardiovascular: Normal rate, regular rhythm, normal heart sounds and intact distal pulses. Exam reveals no gallop and no friction rub.  No murmur heard. Pulmonary/Chest: Effort normal and breath sounds normal. No respiratory distress. She has no wheezes. She has no rales.  Abdominal: Soft. Bowel sounds are normal. She exhibits no mass. There is no hepatosplenomegaly. There is no tenderness. There is no rebound and no guarding.  Musculoskeletal: Normal range of motion. She exhibits no edema or tenderness.  Lymphadenopathy:    She has no cervical adenopathy.  Neurological: She is alert and oriented to person, place, and time. She has normal strength. She displays normal reflexes. No cranial nerve deficit.  Skin: Skin is warm. No rash noted.  Psychiatric: She has a normal mood and affect. Her mood appears not anxious. She does not exhibit a depressed mood.  Nursing note and vitals reviewed.   BP (!) 112/58   Pulse 60   Ht 5\' 7"  (1.702 m)   Wt 144 lb (65.3 kg)   BMI 22.55 kg/m   Assessment and Plan:  1. Hospital discharge follow-up Transition of care provided and discussed availability for concerns as necessary.   2. Flu vaccine need Discussed and administered. - Flu vaccine HIGH DOSE PF (Fluzone High dose)  Dr. [facility]  06/10/2018

## 2018-06-11 ENCOUNTER — Other Ambulatory Visit: Payer: Self-pay

## 2018-06-11 DIAGNOSIS — I2510 Atherosclerotic heart disease of native coronary artery without angina pectoris: Secondary | ICD-10-CM | POA: Diagnosis not present

## 2018-06-11 DIAGNOSIS — I4891 Unspecified atrial fibrillation: Secondary | ICD-10-CM | POA: Diagnosis not present

## 2018-06-11 DIAGNOSIS — I5022 Chronic systolic (congestive) heart failure: Secondary | ICD-10-CM | POA: Diagnosis not present

## 2018-06-11 DIAGNOSIS — Z87891 Personal history of nicotine dependence: Secondary | ICD-10-CM | POA: Diagnosis not present

## 2018-06-11 DIAGNOSIS — I447 Left bundle-branch block, unspecified: Secondary | ICD-10-CM | POA: Diagnosis not present

## 2018-06-11 DIAGNOSIS — I110 Hypertensive heart disease with heart failure: Secondary | ICD-10-CM | POA: Diagnosis not present

## 2018-06-11 DIAGNOSIS — E785 Hyperlipidemia, unspecified: Secondary | ICD-10-CM | POA: Diagnosis not present

## 2018-06-11 DIAGNOSIS — D509 Iron deficiency anemia, unspecified: Secondary | ICD-10-CM | POA: Diagnosis not present

## 2018-06-11 DIAGNOSIS — I428 Other cardiomyopathies: Secondary | ICD-10-CM | POA: Diagnosis not present

## 2018-06-11 DIAGNOSIS — Z7902 Long term (current) use of antithrombotics/antiplatelets: Secondary | ICD-10-CM | POA: Diagnosis not present

## 2018-06-11 DIAGNOSIS — Z8673 Personal history of transient ischemic attack (TIA), and cerebral infarction without residual deficits: Secondary | ICD-10-CM | POA: Diagnosis not present

## 2018-06-11 DIAGNOSIS — C50919 Malignant neoplasm of unspecified site of unspecified female breast: Secondary | ICD-10-CM | POA: Diagnosis not present

## 2018-06-11 DIAGNOSIS — I214 Non-ST elevation (NSTEMI) myocardial infarction: Secondary | ICD-10-CM | POA: Diagnosis not present

## 2018-06-11 DIAGNOSIS — Z7901 Long term (current) use of anticoagulants: Secondary | ICD-10-CM | POA: Diagnosis not present

## 2018-06-11 DIAGNOSIS — I252 Old myocardial infarction: Secondary | ICD-10-CM | POA: Diagnosis not present

## 2018-06-11 DIAGNOSIS — M5416 Radiculopathy, lumbar region: Secondary | ICD-10-CM | POA: Diagnosis not present

## 2018-06-11 DIAGNOSIS — Z5181 Encounter for therapeutic drug level monitoring: Secondary | ICD-10-CM | POA: Diagnosis not present

## 2018-06-11 DIAGNOSIS — Z9181 History of falling: Secondary | ICD-10-CM | POA: Diagnosis not present

## 2018-06-11 NOTE — Patient Outreach (Signed)
[facility]) Care Management  06/11/2018  Veronica Ellis 05-30-1934 [id number]     Transition of Care Referral  Referral Date: 06/11/18 Referral Source: HTA Discharge Report Date of Admission:  Diagnosis: unknown Date of Discharge: 06/09/18 Facility: [facility]    Outreach attempt # 1 to patient with permission to speak with son. Son-Edward is main caregiver for patient. He voices that patient is doing well and recovering at home. He states that patient has complained of little n&v but has it has since resolved. He denies patient having any issues with pain. Son voices that he bought a scale and is weighing patient daily in the morning. He voices weight is staying stable. He denies any swelling or edema. Son is aware of weight gain parameters and when to notify MD. Son voices that patient is adhering to low salt diet. She went for PCP follow up  appt on yesterday. Son provides transportation to appts for patient. RN CM also noted in chart cardiology office has completed TOC call as well with son with meds reviewed. Patient goes for cardiology appt on 07/09/18. Caregiver did confirm that [facility] services has contacted them and RN will be out shortly to assess patient and her meds so med review not completed this call. Son denies any RN CM needs or concerns at this time and feels that [facility] will be a good support for him and the patient at this time. He is agreeable to RN CM mailing out [facility] brochure and contact info for future reference if needs or concerns rise.    Plan: RN CM will close case at this time. RN CM will send [facility] brochure and contact info to patein via mail.   Enzo Montgomery, RN,BSN,CCM [facility] Management Telephonic Care Management Coordinator Direct Phone: [phone] Toll Free: [phone] Fax: [phone]

## 2018-06-12 ENCOUNTER — Telehealth: Payer: Self-pay

## 2018-06-12 NOTE — Telephone Encounter (Signed)
Ebony Hail from [facility] called for orders on pt for PT and OT, telehealth, and [facility]- gave the okay to proceed- [phone]

## 2018-06-23 ENCOUNTER — Telehealth: Payer: Self-pay

## 2018-06-23 NOTE — Telephone Encounter (Signed)
Veronica Ellis called to get orders for OT to assist with ADLs and exercises

## 2018-06-27 DIAGNOSIS — I428 Other cardiomyopathies: Secondary | ICD-10-CM | POA: Diagnosis not present

## 2018-06-27 DIAGNOSIS — Z8673 Personal history of transient ischemic attack (TIA), and cerebral infarction without residual deficits: Secondary | ICD-10-CM | POA: Diagnosis not present

## 2018-06-27 DIAGNOSIS — M5416 Radiculopathy, lumbar region: Secondary | ICD-10-CM | POA: Diagnosis not present

## 2018-06-27 DIAGNOSIS — Z9181 History of falling: Secondary | ICD-10-CM | POA: Diagnosis not present

## 2018-06-27 DIAGNOSIS — I447 Left bundle-branch block, unspecified: Secondary | ICD-10-CM | POA: Diagnosis not present

## 2018-06-27 DIAGNOSIS — Z7902 Long term (current) use of antithrombotics/antiplatelets: Secondary | ICD-10-CM | POA: Diagnosis not present

## 2018-06-27 DIAGNOSIS — I5022 Chronic systolic (congestive) heart failure: Secondary | ICD-10-CM | POA: Diagnosis not present

## 2018-06-27 DIAGNOSIS — I110 Hypertensive heart disease with heart failure: Secondary | ICD-10-CM | POA: Diagnosis not present

## 2018-06-27 DIAGNOSIS — I4891 Unspecified atrial fibrillation: Secondary | ICD-10-CM | POA: Diagnosis not present

## 2018-06-27 DIAGNOSIS — I2510 Atherosclerotic heart disease of native coronary artery without angina pectoris: Secondary | ICD-10-CM | POA: Diagnosis not present

## 2018-06-27 DIAGNOSIS — I252 Old myocardial infarction: Secondary | ICD-10-CM | POA: Diagnosis not present

## 2018-06-27 DIAGNOSIS — D509 Iron deficiency anemia, unspecified: Secondary | ICD-10-CM | POA: Diagnosis not present

## 2018-06-27 DIAGNOSIS — Z87891 Personal history of nicotine dependence: Secondary | ICD-10-CM | POA: Diagnosis not present

## 2018-06-27 DIAGNOSIS — E785 Hyperlipidemia, unspecified: Secondary | ICD-10-CM | POA: Diagnosis not present

## 2018-06-27 DIAGNOSIS — Z5181 Encounter for therapeutic drug level monitoring: Secondary | ICD-10-CM | POA: Diagnosis not present

## 2018-06-27 DIAGNOSIS — Z7901 Long term (current) use of anticoagulants: Secondary | ICD-10-CM | POA: Diagnosis not present

## 2018-06-27 DIAGNOSIS — C50919 Malignant neoplasm of unspecified site of unspecified female breast: Secondary | ICD-10-CM | POA: Diagnosis not present

## 2018-07-08 DIAGNOSIS — I2510 Atherosclerotic heart disease of native coronary artery without angina pectoris: Secondary | ICD-10-CM | POA: Insufficient documentation

## 2018-07-08 DIAGNOSIS — I5042 Chronic combined systolic (congestive) and diastolic (congestive) heart failure: Secondary | ICD-10-CM | POA: Insufficient documentation

## 2018-07-08 DIAGNOSIS — I255 Ischemic cardiomyopathy: Secondary | ICD-10-CM | POA: Insufficient documentation

## 2018-07-08 DIAGNOSIS — I5043 Acute on chronic combined systolic (congestive) and diastolic (congestive) heart failure: Secondary | ICD-10-CM | POA: Insufficient documentation

## 2018-07-09 DIAGNOSIS — C50919 Malignant neoplasm of unspecified site of unspecified female breast: Secondary | ICD-10-CM | POA: Diagnosis not present

## 2018-07-09 DIAGNOSIS — Z8673 Personal history of transient ischemic attack (TIA), and cerebral infarction without residual deficits: Secondary | ICD-10-CM | POA: Diagnosis not present

## 2018-07-09 DIAGNOSIS — I5022 Chronic systolic (congestive) heart failure: Secondary | ICD-10-CM | POA: Diagnosis not present

## 2018-07-09 DIAGNOSIS — I255 Ischemic cardiomyopathy: Secondary | ICD-10-CM | POA: Diagnosis not present

## 2018-07-09 DIAGNOSIS — I480 Paroxysmal atrial fibrillation: Secondary | ICD-10-CM | POA: Diagnosis not present

## 2018-07-09 DIAGNOSIS — I110 Hypertensive heart disease with heart failure: Secondary | ICD-10-CM | POA: Diagnosis not present

## 2018-07-09 DIAGNOSIS — D509 Iron deficiency anemia, unspecified: Secondary | ICD-10-CM | POA: Diagnosis not present

## 2018-07-09 DIAGNOSIS — I428 Other cardiomyopathies: Secondary | ICD-10-CM | POA: Diagnosis not present

## 2018-07-09 DIAGNOSIS — I5042 Chronic combined systolic (congestive) and diastolic (congestive) heart failure: Secondary | ICD-10-CM | POA: Diagnosis not present

## 2018-07-09 DIAGNOSIS — E7800 Pure hypercholesterolemia, unspecified: Secondary | ICD-10-CM | POA: Diagnosis not present

## 2018-07-09 DIAGNOSIS — Z87891 Personal history of nicotine dependence: Secondary | ICD-10-CM | POA: Diagnosis not present

## 2018-07-09 DIAGNOSIS — Z7901 Long term (current) use of anticoagulants: Secondary | ICD-10-CM | POA: Diagnosis not present

## 2018-07-09 DIAGNOSIS — E785 Hyperlipidemia, unspecified: Secondary | ICD-10-CM | POA: Diagnosis not present

## 2018-07-09 DIAGNOSIS — I447 Left bundle-branch block, unspecified: Secondary | ICD-10-CM | POA: Diagnosis not present

## 2018-07-09 DIAGNOSIS — I214 Non-ST elevation (NSTEMI) myocardial infarction: Secondary | ICD-10-CM | POA: Diagnosis not present

## 2018-07-09 DIAGNOSIS — I4891 Unspecified atrial fibrillation: Secondary | ICD-10-CM | POA: Diagnosis not present

## 2018-07-09 DIAGNOSIS — Z7902 Long term (current) use of antithrombotics/antiplatelets: Secondary | ICD-10-CM | POA: Diagnosis not present

## 2018-07-09 DIAGNOSIS — M5416 Radiculopathy, lumbar region: Secondary | ICD-10-CM | POA: Diagnosis not present

## 2018-07-09 DIAGNOSIS — Z9181 History of falling: Secondary | ICD-10-CM | POA: Diagnosis not present

## 2018-07-09 DIAGNOSIS — I2510 Atherosclerotic heart disease of native coronary artery without angina pectoris: Secondary | ICD-10-CM | POA: Diagnosis not present

## 2018-07-16 ENCOUNTER — Telehealth: Payer: Self-pay

## 2018-07-16 NOTE — Telephone Encounter (Signed)
Veronica Ellis from [facility] called stating that the doctor with the health team advantage has been signing orders for pt to have [facility] care. However, pt has a 15% ejection fraction, hx of breast cancer and is on a medicine that will make her heart failure get worse. Therefore, he has suggested palliative care rather than continuing [place]. Veronica Ellis called to get Dr. Ronnald Ramp' opinion on that. Dr. Ronnald Ramp has agreed to the palliative care. Veronica Ellis is going to contact pt's cardiologist as well.

## 2018-07-17 ENCOUNTER — Emergency Department
Admission: EM | Admit: 2018-07-17 | Discharge: 2018-07-17 | Disposition: A | Discharge status: Home / Self Care | Payer: PPO | Attending: Emergency Medicine | Admitting: Emergency Medicine

## 2018-07-17 ENCOUNTER — Emergency Department: Payer: PPO

## 2018-07-17 ENCOUNTER — Ambulatory Visit: Payer: Self-pay | Admitting: Family Medicine

## 2018-07-17 ENCOUNTER — Other Ambulatory Visit: Payer: Self-pay

## 2018-07-17 DIAGNOSIS — Y998 Other external cause status: Secondary | ICD-10-CM | POA: Insufficient documentation

## 2018-07-17 DIAGNOSIS — I252 Old myocardial infarction: Secondary | ICD-10-CM | POA: Insufficient documentation

## 2018-07-17 DIAGNOSIS — Z79899 Other long term (current) drug therapy: Secondary | ICD-10-CM | POA: Diagnosis not present

## 2018-07-17 DIAGNOSIS — S79912A Unspecified injury of left hip, initial encounter: Secondary | ICD-10-CM | POA: Diagnosis not present

## 2018-07-17 DIAGNOSIS — Z7901 Long term (current) use of anticoagulants: Secondary | ICD-10-CM | POA: Insufficient documentation

## 2018-07-17 DIAGNOSIS — Y92009 Unspecified place in unspecified non-institutional (private) residence as the place of occurrence of the external cause: Secondary | ICD-10-CM | POA: Diagnosis not present

## 2018-07-17 DIAGNOSIS — S3993XA Unspecified injury of pelvis, initial encounter: Secondary | ICD-10-CM | POA: Diagnosis not present

## 2018-07-17 DIAGNOSIS — F319 Bipolar disorder, unspecified: Secondary | ICD-10-CM | POA: Diagnosis not present

## 2018-07-17 DIAGNOSIS — S3992XA Unspecified injury of lower back, initial encounter: Secondary | ICD-10-CM | POA: Diagnosis not present

## 2018-07-17 DIAGNOSIS — W010XXA Fall on same level from slipping, tripping and stumbling without subsequent striking against object, initial encounter: Secondary | ICD-10-CM | POA: Insufficient documentation

## 2018-07-17 DIAGNOSIS — Z853 Personal history of malignant neoplasm of breast: Secondary | ICD-10-CM | POA: Diagnosis not present

## 2018-07-17 DIAGNOSIS — Y9389 Activity, other specified: Secondary | ICD-10-CM | POA: Diagnosis not present

## 2018-07-17 DIAGNOSIS — S300XXA Contusion of lower back and pelvis, initial encounter: Secondary | ICD-10-CM | POA: Diagnosis not present

## 2018-07-17 DIAGNOSIS — Z87891 Personal history of nicotine dependence: Secondary | ICD-10-CM | POA: Insufficient documentation

## 2018-07-17 DIAGNOSIS — I10 Essential (primary) hypertension: Secondary | ICD-10-CM | POA: Diagnosis not present

## 2018-07-17 DIAGNOSIS — S20222A Contusion of left back wall of thorax, initial encounter: Secondary | ICD-10-CM

## 2018-07-17 DIAGNOSIS — R410 Disorientation, unspecified: Secondary | ICD-10-CM | POA: Diagnosis not present

## 2018-07-17 DIAGNOSIS — M25552 Pain in left hip: Secondary | ICD-10-CM

## 2018-07-17 DIAGNOSIS — F419 Anxiety disorder, unspecified: Secondary | ICD-10-CM | POA: Insufficient documentation

## 2018-07-17 DIAGNOSIS — S0990XA Unspecified injury of head, initial encounter: Secondary | ICD-10-CM | POA: Diagnosis not present

## 2018-07-17 DIAGNOSIS — M25551 Pain in right hip: Secondary | ICD-10-CM | POA: Diagnosis not present

## 2018-07-17 LAB — URINALYSIS, COMPLETE (UACMP) WITH MICROSCOPIC
BACTERIA UA: NONE SEEN
BILIRUBIN URINE: NEGATIVE
Glucose, UA: NEGATIVE mg/dL
KETONES UR: NEGATIVE mg/dL
Leukocytes, UA: NEGATIVE
Nitrite: NEGATIVE
PH: 5 (ref 5.0–8.0)
PROTEIN: NEGATIVE mg/dL
Specific Gravity, Urine: 1.01 (ref 1.005–1.030)

## 2018-07-17 LAB — CBC WITH DIFFERENTIAL/PLATELET
Abs Immature Granulocytes: 0.02 10*3/uL (ref 0.00–0.07)
BASOS PCT: 1 %
Basophils Absolute: 0 10*3/uL (ref 0.0–0.1)
EOS ABS: 0.2 10*3/uL (ref 0.0–0.5)
EOS PCT: 3 %
HEMATOCRIT: 35 % — AB (ref 36.0–46.0)
Hemoglobin: 10.9 g/dL — ABNORMAL LOW (ref 12.0–15.0)
Immature Granulocytes: 0 %
LYMPHS ABS: 1.3 10*3/uL (ref 0.7–4.0)
Lymphocytes Relative: 21 %
MCH: 26.8 pg (ref 26.0–34.0)
MCHC: 31.1 g/dL (ref 30.0–36.0)
MCV: 86.2 fL (ref 80.0–100.0)
MONOS PCT: 12 %
Monocytes Absolute: 0.8 10*3/uL (ref 0.1–1.0)
NEUTROS PCT: 63 %
Neutro Abs: 4.1 10*3/uL (ref 1.7–7.7)
Platelets: 331 10*3/uL (ref 150–400)
RBC: 4.06 MIL/uL (ref 3.87–5.11)
RDW: 20.4 % — ABNORMAL HIGH (ref 11.5–15.5)
WBC: 6.4 10*3/uL (ref 4.0–10.5)
nRBC: 0 % (ref 0.0–0.2)

## 2018-07-17 LAB — BASIC METABOLIC PANEL
ANION GAP: 9 (ref 5–15)
BUN: 25 mg/dL — ABNORMAL HIGH (ref 8–23)
CALCIUM: 8.9 mg/dL (ref 8.9–10.3)
CO2: 29 mmol/L (ref 22–32)
CREATININE: 0.98 mg/dL (ref 0.44–1.00)
Chloride: 100 mmol/L (ref 98–111)
GFR, EST NON AFRICAN AMERICAN: 52 mL/min — AB (ref >60)
GLUCOSE: 105 mg/dL — AB (ref 70–99)
Potassium: 3.9 mmol/L (ref 3.5–5.1)
Sodium: 138 mmol/L (ref 135–145)

## 2018-07-17 NOTE — ED Provider Notes (Signed)
[facility] Emergency Department Provider Note ____________________________________________   I have reviewed the triage vital signs and the triage nursing note.  HISTORY  Chief Complaint Fall and Back Pain   Historian Patient and son  HPI Veronica Ellis Like is a 84 y.o. female presents to the ED after a fall over the weekend.  Apparently she was using her walker and extended her leg out to try to close the recliner and fell onto her buttock.  She states that she thinks she struck her head against the table.  Denies loss of consciousness.  Denies neck pain.  She is had continued low back and left hip/pelvis pain since then.  She is been walking but it hurts a lot.  No chest pain or trouble breathing.  Patient thinks that she might been confused, son has not really noted to much of a difference.  Pain is moderate.     Past Medical History:  Diagnosis Date  . Anxiety   . Anxiety   . Arthritis   . Breast cancer ([place]) 2014   bilateral invasive mammary carcinoma. with 36 rad tx.   . Breast cancer ([place])   . Cancer ([place])    breast  . Depression   . Depression   . Hard of hearing   . Hyperlipemia   . Hypertension   . IBS (irritable bowel syndrome)   . Memory loss   . Osteoporosis   . Wears dentures    full upper, partial lower    Patient Active Problem List   Diagnosis Date Noted  . NSTEMI (non-ST elevated myocardial infarction) ([place]) 05/19/2018  . Ataxia 12/31/2016  . Memory loss 12/31/2016  . Recurrent major depressive disorder, in partial remission ([place]) 12/31/2016  . Bipolar 1 disorder, mixed, moderate ([place]) 12/25/2016  . Bilateral breast cancer ([place]) 02/20/2016  . 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 Cerebrovascular disease 02/16/2015  . Anxiety 02/16/2015  . Breast lump 02/16/2015  . 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[place]) 02/16/2015  . 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 Routine general medical examination at a  health care facility 02/16/2015  . Recurrent major depressive episodes ([place]) 02/16/2015  . Below normal amount of sodium in the blood 02/16/2015  . Pre-operative cardiovascular examination 02/16/2015    Past Surgical History:  Procedure Laterality Date  . APPENDECTOMY    . BREAST BIOPSY Bilateral 2014  . BREAST SURGERY Bilateral   . CATARACT EXTRACTION W/PHACO Right 12/11/2017   Procedure: CATARACT EXTRACTION PHACO AND INTRAOCULAR LENS PLACEMENT ([place]) COMPLICATED RIGHT;  Surgeon: Leandrew Koyanagi, MD;  Location: [facility];  Service: Ophthalmology;  Laterality: Right;  . COLON SURGERY     12 in. removed due to polyps  . LEFT HEART CATH AND CORONARY ANGIOGRAPHY N/A 05/20/2018   Procedure: LEFT HEART CATH AND CORONARY ANGIOGRAPHY;  Surgeon: Corey Skains, MD;  Location: [facility] CV LAB;  Service: Cardiovascular;  Laterality: N/A;  . TUBAL LIGATION      Prior to Admission medications   Medication Sig Start Date End Date Taking? Authorizing Provider  buPROPion (WELLBUTRIN) 100 MG tablet Take 1 tablet (100 mg total) by mouth 2 (two) times daily. 12/26/17   Juline Patch, MD  gabapentin (NEURONTIN) 300 MG capsule Take 300-600 mg by mouth 3 (three) times daily as needed (pain). neurology 10/11/16   [provider]  letrozole ([place]) 2.5 MG tablet TAKE 1 TABLET BY MOUTH EVERY DAY 11/29/17   Lloyd Huger, MD  nitroGLYCERIN (NITROGLYN) 2 % ointment  Apply 1 inch topically every 6 (six) hours. 05/21/18   Demetrios Loll, MD  pantoprazole (PROTONIX) 40 MG tablet Take 1 tablet by mouth 2 (two) times daily. 06/09/18 06/09/19  [provider]  saccharomyces boulardii (FLORASTOR) 250 MG capsule Take by mouth.    [provider]  warfarin (COUMADIN) 3 MG tablet Take 1 tablet by mouth at bedtime. 06/09/18 06/09/19  [provider]    Allergies  Allergen Reactions  . Iodine Nausea Only    Betadine okay   . Erythromycin Rash  . Penicillins Rash     Has patient had a PCN reaction causing immediate rash, facial/tongue/throat swelling, SOB or lightheadedness with hypotension: No Has patient had a PCN reaction causing severe rash involving mucus membranes or skin necrosis: No Has patient had a PCN reaction that required hospitalization: No Has patient had a PCN reaction occurring within the last 10 years: No If all of the above answers are "NO", then may proceed with Cephalosporin use.   . Shellfish Allergy Rash    Family History  Problem Relation Age of Onset  . Breast cancer Daughter 37  . Breast cancer Daughter 31  . Cirrhosis Mother   . Lung cancer Father     Social History Social History   Tobacco Use  . Smoking status: Former Smoker    Packs/day: 1.00    Years: 60.00    Pack years: 60.00    Types: Cigarettes    Last attempt to quit: 12/2017    Years since quitting: 0.6  . Smokeless tobacco: Never Used  . Tobacco comment: smoking cessation materials not required  Substance Use Topics  . Alcohol use: Not Currently    Alcohol/week: 0.0 standard drinks  . Drug use: No    Review of Systems  Constitutional: Negative for fever. Eyes: Negative for visual changes. ENT: Negative for sore throat. Cardiovascular: Negative for chest pain. Respiratory: Negative for shortness of breath. Gastrointestinal: Negative for abdominal pain, vomiting and diarrhea. Genitourinary: Negative for dysuria. Musculoskeletal: Low back pain and left hip pain as per HPI. Skin: Negative for rash. Neurological: Negative for headache.  ____________________________________________   PHYSICAL EXAM:  VITAL SIGNS: ED Triage Vitals  Enc Vitals Group     BP 07/17/18 1103 107/85     Pulse Rate 07/17/18 1103 68     Resp 07/17/18 1103 18     Temp 07/17/18 1103 98.9 F (37.2 C)     Temp Source 07/17/18 1103 Oral     SpO2 07/17/18 1103 98 %     Weight 07/17/18 1108 145 lb (65.8 kg)     Height 07/17/18 1108 5\' 7"  (1.702 m)     Head  Circumference --      Peak Flow --      Pain Score 07/17/18 1108 10     Pain Loc --      Pain Edu? --      Excl. in [place]? --      Constitutional: Alert. HEENT      Head: Normocephalic and atraumatic.      Eyes: Conjunctivae are normal. Pupils equal and round.       Ears:         Nose: No congestion/rhinnorhea.      Mouth/Throat: Mucous membranes are moist.      Neck: No stridor. Cardiovascular/Chest: Normal rate, regular rhythm.  No murmurs, rubs, or gallops. Respiratory: Normal respiratory effort without tachypnea nor retractions. Breath sounds are clear and equal bilaterally. No wheezes/rales/rhonchi. Gastrointestinal:  Soft. No distention, no guarding, no rebound. Nontender.    Genitourinary/rectal:Deferred Musculoskeletal: Stable.  Some tenderness with range of motion of the left hip.  No shortening.  No lower extremity effusions.  No other joint pains.   Neurologic:  Normal speech and language. No gross or focal neurologic deficits are appreciated. Skin:  Skin is warm, dry and intact. No rash noted. Psychiatric: Mood and affect are normal. Speech and behavior are normal. Patient exhibits appropriate insight and judgment.   ____________________________________________  LABS (pertinent positives/negatives) I, Lisa Roca, MD the attending physician have reviewed the labs noted below.  Labs Reviewed  URINALYSIS, COMPLETE (UACMP) WITH MICROSCOPIC - Abnormal; Notable for the following components:      Result Value   Color, Urine YELLOW (*)    APPearance CLEAR (*)    Hgb urine dipstick SMALL (*)    All other components within normal limits  BASIC METABOLIC PANEL - Abnormal; Notable for the following components:   Glucose, Bld 105 (*)    BUN 25 (*)    GFR calc non Af Amer 52 (*)    All other components within normal limits  CBC WITH DIFFERENTIAL/PLATELET - Abnormal; Notable for the following components:   Hemoglobin 10.9 (*)    HCT 35.0 (*)    RDW 20.4 (*)    All other  components within normal limits    ____________________________________________    EKG I, Lisa Roca, MD, the attending physician have personally viewed and interpreted all ECGs.  None ____________________________________________  RADIOLOGY   CT head without contrast: Small old right periventricular white matter infarct.  No acute intracranial normalities.  Left hip with pelvis x-ray:  IMPRESSION: No fracture, dislocation or acute finding.  DG lumbar spine:  IMPRESSION: 1. No fracture or acute finding. 2. Mild degenerative changes. Findings without significant change from the prior lumbar spine MRI.  CT pelvis without contrast:  IMPRESSION: 1. No acute fracture identified. 2. Lower lumbar spondylosis. 3. 5.6 cm simple appearing left adnexal cyst. Follow-up pelvic ultrasound is recommended 3-6 months. __________________________________________  PROCEDURES  Procedure(s) performed: None  Procedures  Critical Care performed: None   ____________________________________________  ED COURSE / ASSESSMENT AND PLAN  Pertinent labs & imaging results that were available during my care of the patient were reviewed by me and considered in my medical decision making (see chart for details).     Patient here for evaluation of low lumbar and left hip pain after fall couple days ago.  She is also on multiple blood thinners and feels like she is been confused and reports striking her head.  There is no neck pain.  CT that shows no intra-cranial traumatic injury.  Lumbar x-ray and left hip x-ray are negative.  Patient was able to stand up but she had a lot of pain while walking to the bathroom.  We discussed obtaining CT to evaluate for occult fracture.  Without evidence of fracture.  We discussed would be pretty unusual to still have a missed fracture, however recommend follow-up with primary care doctor in 1 week.  MRI might provide even more detail if not improving.  I did  print out the results showing the incidental finding on the adrenal gland and nonemergent follow-up recognitions.    CONSULTATIONS:   None   Patient / Family / Caregiver informed of clinical course, medical decision-making process, and agree with plan.  I discussed return precautions, follow-up instructions, and discharge instructions with patient and/or family.  Discharge Instructions: No serious injury is  suspected.  Return to the emergency department immediately for any worsening condition including numbness, weakness, seizure altered mental status.      ___________________________________________   FINAL CLINICAL IMPRESSION(S) / ED DIAGNOSES   Final diagnoses:  Contusion of left side of back, initial encounter  Left hip pain      ___________________________________________         Note: This dictation was prepared with Dragon dictation. Any transcriptional errors that result from this process are unintentional    Lisa Roca, MD 07/17/18 1851

## 2018-07-17 NOTE — ED Triage Notes (Signed)
Pt states she slipped and fell back to the floor pt c/o lower back pain since . Pt has home health.

## 2018-07-17 NOTE — Progress Notes (Signed)
   07/17/18 1500  Clinical Encounter Type  Visited With Patient and family together  Visit Type Initial;Patient actively dying;Spiritual support  Recommendations Follow-up, as needed.  Spiritual Encounters  Spiritual Needs Emotional;Prayer   Chaplain met with the patient and son, Veronica Ellis, and granddaughter, Veronica Ellis. Chaplain provided emotional and spiritual support. The encounter was ended with a prayer for the patient, and an invitation to return as needed.

## 2018-07-17 NOTE — Discharge Instructions (Addendum)
No serious injury is suspected.  Return to the emergency department immediately for any worsening condition including numbness, weakness, seizure altered mental status.

## 2018-07-17 NOTE — ED Notes (Signed)
FIRST NURSE NOTE:  Pt arrived with family from home after fall, pt has confusion. No distress noted on arrival. Pt is pleasant and cooperative.  Hard of hearing.

## 2018-07-17 NOTE — ED Notes (Signed)
To CT Scan

## 2018-08-02 ENCOUNTER — Other Ambulatory Visit: Payer: Self-pay | Admitting: Oncology

## 2018-08-05 ENCOUNTER — Encounter: Payer: Self-pay | Admitting: Family Medicine

## 2018-08-05 ENCOUNTER — Ambulatory Visit (INDEPENDENT_AMBULATORY_CARE_PROVIDER_SITE_OTHER): Payer: PPO | Admitting: Family Medicine

## 2018-08-05 VITALS — BP 130/70 | HR 76 | Ht 67.0 in | Wt 147.0 lb

## 2018-08-05 DIAGNOSIS — W19XXXS Unspecified fall, sequela: Secondary | ICD-10-CM

## 2018-08-05 DIAGNOSIS — D699 Hemorrhagic condition, unspecified: Secondary | ICD-10-CM | POA: Diagnosis not present

## 2018-08-05 NOTE — Progress Notes (Signed)
Date:  08/05/2018   Name:  Veronica Ellis   DOB:  1934-08-04   MRN:  [id number]   Chief Complaint: Follow-up (has been on coumadin/ covered and controlled by cardiology at [facility]. Following up on this) Fall  The accident occurred more than 1 week ago. The fall occurred while walking. The pain is moderate. Pertinent negatives include no abdominal pain, fever, headaches, hematuria, nausea, numbness or vomiting. She has tried acetaminophen for the symptoms. The treatment provided no relief.     Review of Systems  Constitutional: Negative.  Negative for chills, fatigue, fever and unexpected weight change.  HENT: Negative for congestion, ear discharge, ear pain, rhinorrhea, sinus pressure, sneezing and sore throat.   Eyes: Negative for photophobia, pain, discharge, redness and itching.  Respiratory: Negative for cough, shortness of breath, wheezing and stridor.   Cardiovascular: Negative for chest pain and leg swelling.  Gastrointestinal: Negative for abdominal pain, blood in stool, constipation, diarrhea, nausea and vomiting.  Endocrine: Negative for cold intolerance, heat intolerance, polydipsia, polyphagia and polyuria.  Genitourinary: Negative for dysuria, flank pain, frequency, hematuria, menstrual problem, pelvic pain, urgency, vaginal bleeding and vaginal discharge.  Musculoskeletal: Negative for arthralgias, back pain and myalgias.  Skin: Negative for rash.  Allergic/Immunologic: Negative for environmental allergies and food allergies.  Neurological: Negative for dizziness, weakness, light-headedness, numbness and headaches.  Hematological: Negative for adenopathy. Does not bruise/bleed easily.  Psychiatric/Behavioral: Negative for dysphoric mood. The patient is not nervous/anxious.     Patient Active Problem List   Diagnosis Date Noted  . NSTEMI (non-ST elevated myocardial infarction) ([place]) 05/19/2018  . Ataxia 12/31/2016  . Memory loss 12/31/2016  . Recurrent major  depressive disorder, in partial remission ([place]) 12/31/2016  . Bipolar 1 disorder, mixed, moderate ([place]) 12/25/2016  . Bilateral breast cancer ([place]) 02/20/2016  . 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 Cerebrovascular disease 02/16/2015  . Anxiety 02/16/2015  . Breast lump 02/16/2015  . 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[place]) 02/16/2015  . 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 Routine general medical examination at a health care facility 02/16/2015  . Recurrent major depressive episodes ([place]) 02/16/2015  . Below normal amount of sodium in the blood 02/16/2015  . Pre-operative cardiovascular examination 02/16/2015    Allergies  Allergen Reactions  . Iodine Nausea Only    Betadine okay   . Erythromycin Rash  . Penicillins Rash    Has patient had a PCN reaction causing immediate rash, facial/tongue/throat swelling, SOB or lightheadedness with hypotension: No Has patient had a PCN reaction causing severe rash involving mucus membranes or skin necrosis: No Has patient had a PCN reaction that required hospitalization: No Has patient had a PCN reaction occurring within the last 10 years: No If all of the above answers are "NO", then may proceed with Cephalosporin use.   Marland Kitchen Shellfish Allergy Rash    Past Surgical History:  Procedure Laterality Date  . APPENDECTOMY    . BREAST BIOPSY Bilateral 2014  . BREAST SURGERY Bilateral   . CATARACT EXTRACTION W/PHACO Right 12/11/2017   Procedure: CATARACT EXTRACTION PHACO AND INTRAOCULAR LENS PLACEMENT ([place]) COMPLICATED RIGHT;  Surgeon: Leandrew Koyanagi, MD;  Location: [facility] [facility];  Service: Ophthalmology;  Laterality: Right;  . COLON SURGERY     12 in. removed due to polyps  . LEFT HEART CATH AND CORONARY ANGIOGRAPHY N/A 05/20/2018   Procedure: LEFT HEART CATH AND CORONARY ANGIOGRAPHY;  Surgeon: Corey Skains, MD;  Location: [facility];  Service: Cardiovascular;  Laterality:  N/A;  . TUBAL LIGATION       Social History   Tobacco Use  . Smoking status: Former Smoker    Packs/day: 1.00    Years: 60.00    Pack years: 60.00    Types: Cigarettes    Last attempt to quit: 12/2017    Years since quitting: 0.6  . Smokeless tobacco: Never Used  . Tobacco comment: smoking cessation materials not required  Substance Use Topics  . Alcohol use: Not Currently    Alcohol/week: 0.0 standard drinks  . Drug use: No     Medication list has been reviewed and updated.  No outpatient medications have been marked as taking for the 08/05/18 encounter (Office Visit) with Juline Patch, MD.    [facility] 2/9 Scores 03/03/2018 12/26/2017 02/25/2017 02/13/2016  PHQ - 2 Score 4 1 0 0  PHQ- 9 Score 11 2 - -    Physical Exam  Constitutional: She is oriented to person, place, and time. She appears well-developed and well-nourished.  HENT:  Head: Normocephalic.  Right Ear: External ear normal.  Left Ear: External ear normal.  Mouth/Throat: Oropharynx is clear and moist.  Eyes: Pupils are equal, round, and reactive to light. Conjunctivae and EOM are normal. Lids are everted and swept, no foreign bodies found. Left eye exhibits no hordeolum. No foreign body present in the left eye. Right conjunctiva is not injected. Left conjunctiva is not injected. No scleral icterus.  Neck: Normal range of motion. Neck supple. No JVD present. No tracheal deviation present. No thyromegaly present.  Cardiovascular: Normal rate, regular rhythm, normal heart sounds and intact distal pulses. Exam reveals no gallop and no friction rub.  No murmur heard. Pulmonary/Chest: Effort normal and breath sounds normal. No respiratory distress. She has no wheezes. She has no rales.  Abdominal: Soft. Bowel sounds are normal. She exhibits no mass. There is no hepatosplenomegaly. There is no tenderness. There is no rebound and no guarding.  Musculoskeletal: Normal range of motion. She exhibits no edema or tenderness.  Lymphadenopathy:    She has  no cervical adenopathy.  Neurological: She is alert and oriented to person, place, and time. She has normal strength. She displays normal reflexes. No cranial nerve deficit.  Skin: Skin is warm. No rash noted.  Psychiatric: She has a normal mood and affect. Her mood appears not anxious. She does not exhibit a depressed mood.  Nursing note and vitals reviewed.   BP 130/70   Pulse 76   Ht 5\' 7"  (1.702 m)   Wt 147 lb (66.7 kg)   SpO2 99%   BMI 23.02 kg/m   Assessment and Plan: 1. Fall, sequela Follow-up from emergency room visit for a fall sustained on 07/17/2018.  Patient has continued to improve with decreased pain and ability to ambulate.  We will continue to hold on driving until patient becomes more steady.   Dr. Mac[facility]  08/05/2018

## 2018-08-11 DIAGNOSIS — I2510 Atherosclerotic heart disease of native coronary artery without angina pectoris: Secondary | ICD-10-CM | POA: Diagnosis not present

## 2018-08-11 DIAGNOSIS — Z7902 Long term (current) use of antithrombotics/antiplatelets: Secondary | ICD-10-CM | POA: Diagnosis not present

## 2018-08-11 DIAGNOSIS — I429 Cardiomyopathy, unspecified: Secondary | ICD-10-CM | POA: Diagnosis not present

## 2018-08-11 DIAGNOSIS — I255 Ischemic cardiomyopathy: Secondary | ICD-10-CM | POA: Diagnosis not present

## 2018-08-11 DIAGNOSIS — I440 Atrioventricular block, first degree: Secondary | ICD-10-CM | POA: Diagnosis not present

## 2018-08-11 DIAGNOSIS — E875 Hyperkalemia: Secondary | ICD-10-CM | POA: Diagnosis not present

## 2018-08-11 DIAGNOSIS — R001 Bradycardia, unspecified: Secondary | ICD-10-CM | POA: Diagnosis not present

## 2018-08-11 DIAGNOSIS — Z8673 Personal history of transient ischemic attack (TIA), and cerebral infarction without residual deficits: Secondary | ICD-10-CM | POA: Diagnosis not present

## 2018-08-11 DIAGNOSIS — E7800 Pure hypercholesterolemia, unspecified: Secondary | ICD-10-CM | POA: Diagnosis not present

## 2018-08-11 DIAGNOSIS — Z79899 Other long term (current) drug therapy: Secondary | ICD-10-CM | POA: Diagnosis not present

## 2018-08-11 DIAGNOSIS — I5042 Chronic combined systolic (congestive) and diastolic (congestive) heart failure: Secondary | ICD-10-CM | POA: Diagnosis not present

## 2018-08-11 DIAGNOSIS — Z7901 Long term (current) use of anticoagulants: Secondary | ICD-10-CM | POA: Diagnosis not present

## 2018-08-11 DIAGNOSIS — I252 Old myocardial infarction: Secondary | ICD-10-CM | POA: Diagnosis not present

## 2018-08-11 DIAGNOSIS — R9431 Abnormal electrocardiogram [ECG] [EKG]: Secondary | ICD-10-CM | POA: Diagnosis not present

## 2018-08-11 DIAGNOSIS — Z87891 Personal history of nicotine dependence: Secondary | ICD-10-CM | POA: Diagnosis not present

## 2018-08-11 DIAGNOSIS — I480 Paroxysmal atrial fibrillation: Secondary | ICD-10-CM | POA: Diagnosis not present

## 2018-08-11 DIAGNOSIS — I4891 Unspecified atrial fibrillation: Secondary | ICD-10-CM | POA: Diagnosis not present

## 2018-08-11 DIAGNOSIS — I447 Left bundle-branch block, unspecified: Secondary | ICD-10-CM | POA: Diagnosis not present

## 2018-08-21 ENCOUNTER — Encounter: Payer: Self-pay | Admitting: Emergency Medicine

## 2018-08-21 ENCOUNTER — Other Ambulatory Visit: Payer: Self-pay

## 2018-08-21 ENCOUNTER — Emergency Department
Admission: EM | Admit: 2018-08-21 | Discharge: 2018-08-21 | Disposition: A | Discharge status: Home / Self Care | Payer: PPO | Attending: Emergency Medicine | Admitting: Emergency Medicine

## 2018-08-21 ENCOUNTER — Emergency Department: Payer: PPO

## 2018-08-21 DIAGNOSIS — R079 Chest pain, unspecified: Secondary | ICD-10-CM | POA: Diagnosis not present

## 2018-08-21 DIAGNOSIS — Z853 Personal history of malignant neoplasm of breast: Secondary | ICD-10-CM | POA: Diagnosis not present

## 2018-08-21 DIAGNOSIS — Z79899 Other long term (current) drug therapy: Secondary | ICD-10-CM | POA: Diagnosis not present

## 2018-08-21 DIAGNOSIS — R000 Tachycardia, unspecified: Secondary | ICD-10-CM | POA: Diagnosis not present

## 2018-08-21 DIAGNOSIS — I10 Essential (primary) hypertension: Secondary | ICD-10-CM | POA: Diagnosis not present

## 2018-08-21 DIAGNOSIS — R0789 Other chest pain: Secondary | ICD-10-CM | POA: Insufficient documentation

## 2018-08-21 DIAGNOSIS — Z7901 Long term (current) use of anticoagulants: Secondary | ICD-10-CM | POA: Diagnosis not present

## 2018-08-21 DIAGNOSIS — Z87891 Personal history of nicotine dependence: Secondary | ICD-10-CM | POA: Insufficient documentation

## 2018-08-21 DIAGNOSIS — I959 Hypotension, unspecified: Secondary | ICD-10-CM | POA: Diagnosis not present

## 2018-08-21 DIAGNOSIS — I4891 Unspecified atrial fibrillation: Secondary | ICD-10-CM | POA: Diagnosis not present

## 2018-08-21 DIAGNOSIS — I252 Old myocardial infarction: Secondary | ICD-10-CM | POA: Diagnosis not present

## 2018-08-21 LAB — TROPONIN I
Troponin I: <0.03 ng/mL (ref <0.03)
Troponin I: <0.03 ng/mL (ref <0.03)

## 2018-08-21 LAB — CBC WITH DIFFERENTIAL/PLATELET
Abs Immature Granulocytes: 0.02 10*3/uL (ref 0.00–0.07)
BASOS ABS: 0.1 10*3/uL (ref 0.0–0.1)
Basophils Relative: 1 %
EOS ABS: 0.3 10*3/uL (ref 0.0–0.5)
EOS PCT: 6 %
HEMATOCRIT: 34.5 % — AB (ref 36.0–46.0)
Hemoglobin: 10.7 g/dL — ABNORMAL LOW (ref 12.0–15.0)
Immature Granulocytes: 0 %
Lymphocytes Relative: 33 %
Lymphs Abs: 1.8 10*3/uL (ref 0.7–4.0)
MCH: 27 pg (ref 26.0–34.0)
MCHC: 31 g/dL (ref 30.0–36.0)
MCV: 86.9 fL (ref 80.0–100.0)
Monocytes Absolute: 0.5 10*3/uL (ref 0.1–1.0)
Monocytes Relative: 10 %
NEUTROS PCT: 50 %
Neutro Abs: 2.8 10*3/uL (ref 1.7–7.7)
PLATELETS: 286 10*3/uL (ref 150–400)
RBC: 3.97 MIL/uL (ref 3.87–5.11)
RDW: 18.2 % — AB (ref 11.5–15.5)
WBC: 5.6 10*3/uL (ref 4.0–10.5)
nRBC: 0 % (ref 0.0–0.2)

## 2018-08-21 LAB — BASIC METABOLIC PANEL
Anion gap: 7 (ref 5–15)
BUN: 26 mg/dL — AB (ref 8–23)
CALCIUM: 8.6 mg/dL — AB (ref 8.9–10.3)
CHLORIDE: 107 mmol/L (ref 98–111)
CO2: 26 mmol/L (ref 22–32)
Creatinine, Ser: 1.07 mg/dL — ABNORMAL HIGH (ref 0.44–1.00)
GFR calc Af Amer: 56 mL/min — ABNORMAL LOW (ref >60)
GFR, EST NON AFRICAN AMERICAN: 48 mL/min — AB (ref >60)
GLUCOSE: 107 mg/dL — AB (ref 70–99)
Potassium: 3.9 mmol/L (ref 3.5–5.1)
Sodium: 140 mmol/L (ref 135–145)

## 2018-08-21 LAB — PROTIME-INR
INR: 2.18
Prothrombin Time: 24 seconds — ABNORMAL HIGH (ref 11.4–15.2)

## 2018-08-21 MED ORDER — ACETAMINOPHEN 325 MG PO TABS
650.0000 mg | ORAL_TABLET | Freq: Once | ORAL | Status: DC
  Filled 2018-08-21: qty 2

## 2018-08-21 NOTE — ED Provider Notes (Addendum)
[facility] Emergency Department Provider Note  ____________________________________________   I have reviewed the triage vital signs and the nursing notes. Where available I have reviewed prior notes and, if possible and indicated, outside hospital notes.    HISTORY  Chief Complaint Chest Pain    HPI Veronica Ellis is a 84 y.o. female who has a history of bipolar disorder and an NSTEMI in the past, presents today complaining of sided chest pain.  States it started last night after she lifted up some heavy pots while doing dishes.  She feels that it is a muscle pull.  It hurts to touch or change position.  Is been there all night, but is worse when she manipulates it in some way.  Is right at the left pectoralis muscle she states.  She denies any radiation of the chest pain she has no shortness of breath no exertional symptoms, this does not feel like prior heart pain.  She did try nitro with limited success at home received nitro from EMS as well.  EMS also gave me signout.  They reported no evidence of ACS.      Past Medical History:  Diagnosis Date  . Anxiety   . Anxiety   . Arthritis   . Breast cancer ([place]) 2014   bilateral invasive mammary carcinoma. with 36 rad tx.   . Breast cancer ([place])   . Cancer ([place])    breast  . Depression   . Depression   . Hard of hearing   . Hyperlipemia   . Hypertension   . IBS (irritable bowel syndrome)   . Memory loss   . Osteoporosis   . Wears dentures    full upper, partial lower    Patient Active Problem List   Diagnosis Date Noted  . NSTEMI (non-ST elevated myocardial infarction) ([place]) 05/19/2018  . Ataxia 12/31/2016  . Memory loss 12/31/2016  . Recurrent major depressive disorder, in partial remission ([place]) 12/31/2016  . Bipolar 1 disorder, mixed, moderate ([place]) 12/25/2016  . Bilateral breast cancer ([place]) 02/20/2016  . 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  Cerebrovascular disease 02/16/2015  . Anxiety 02/16/2015  . Breast lump 02/16/2015  . 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[place]) 02/16/2015  . 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 Routine general medical examination at a health care facility 02/16/2015  . Recurrent major depressive episodes ([place]) 02/16/2015  . Below normal amount of sodium in the blood 02/16/2015  . Pre-operative cardiovascular examination 02/16/2015    Past Surgical History:  Procedure Laterality Date  . APPENDECTOMY    . BREAST BIOPSY Bilateral 2014  . BREAST SURGERY Bilateral   . CATARACT EXTRACTION W/PHACO Right 12/11/2017   Procedure: CATARACT EXTRACTION PHACO AND INTRAOCULAR LENS PLACEMENT ([place]) COMPLICATED RIGHT;  Surgeon: Leandrew Koyanagi, MD;  Location: [place];  Service: Ophthalmology;  Laterality: Right;  . COLON SURGERY     12 in. removed due to polyps  . LEFT HEART CATH AND CORONARY ANGIOGRAPHY N/A 05/20/2018   Procedure: LEFT HEART CATH AND CORONARY ANGIOGRAPHY;  Surgeon: Corey Skains, MD;  Location: [place] [facility];  Service: Cardiovascular;  Laterality: N/A;  . TUBAL LIGATION      Prior to Admission medications   Medication Sig Start Date End Date Taking? Authorizing Provider  buPROPion (WELLBUTRIN) 100 MG tablet Take 1 tablet (100 mg total) by mouth 2 (two) times daily. 12/26/17   Juline Patch, MD  gabapentin (NEURONTIN) 300 MG capsule Take 300-600 mg by mouth 3 (  three) times daily as needed (pain). neurology 10/11/16   [provider]  letrozole ([place]) 2.5 MG tablet TAKE 1 TABLET BY MOUTH EVERY DAY 08/03/18   Lloyd Huger, MD  nitroGLYCERIN (NITROGLYN) 2 % ointment Apply 1 inch topically every 6 (six) hours. 05/21/18   Demetrios Loll, MD  pantoprazole (PROTONIX) 40 MG tablet Take 1 tablet by mouth 2 (two) times daily. 06/09/18 06/09/19  [provider]  saccharomyces boulardii (FLORASTOR) 250 MG capsule Take by mouth.    [provider]  warfarin (COUMADIN)  3 MG tablet Take 1 tablet by mouth at bedtime. 06/09/18 06/09/19  [provider]    Allergies Iodine; Erythromycin; Penicillins; and Shellfish allergy  Family History  Problem Relation Age of Onset  . Breast cancer Daughter 30  . Breast cancer Daughter 2  . Cirrhosis Mother   . Lung cancer Father     Social History Social History   Tobacco Use  . Smoking status: Former Smoker    Packs/day: 1.00    Years: 60.00    Pack years: 60.00    Types: Cigarettes    Last attempt to quit: 12/2017    Years since quitting: 0.6  . Smokeless tobacco: Never Used  . Tobacco comment: smoking cessation materials not required  Substance Use Topics  . Alcohol use: Not Currently    Alcohol/week: 0.0 standard drinks  . Drug use: No    Review of Systems Constitutional: No fever/chills Eyes: No visual changes. ENT: No sore throat. No stiff neck no neck pain Cardiovascular: Denies chest pain. Respiratory: Denies shortness of breath. Gastrointestinal:   no vomiting.  No diarrhea.  No constipation. Genitourinary: Negative for dysuria. Musculoskeletal: Negative lower extremity swelling Skin: Negative for rash. Neurological: Negative for severe headaches, focal weakness or numbness.   ____________________________________________   PHYSICAL EXAM:  VITAL SIGNS: ED Triage Vitals  Enc Vitals Group     BP      Pulse      Resp      Temp      Temp src      SpO2      Weight      Height      Head Circumference      Peak Flow      Pain Score      Pain Loc      Pain Edu?      Excl. in [place]?     Constitutional: Alert and oriented. Well appearing and in no acute distress. Eyes: Conjunctivae are normal Head: Atraumatic HEENT: No congestion/rhinnorhea. Mucous membranes are moist.  Oropharynx non-erythematous Neck:   Nontender with no meningismus, no masses, no stridor Cardiovascular: Normal rate, regular rhythm. Grossly normal heart sounds.  Good peripheral circulation. Chest:  Tender palpation left chest wall when I touch her chest patient states "ouch that the pain right there" and pulls back.  There is no crepitus there is no feel chest.  It is right at the insertion of the left pectoralis muscle.  Is not red or hot to touch. Respiratory: Normal respiratory effort.  No retractions. Lungs CTAB. Abdominal: Soft and nontender. No distention. No guarding no rebound Back:  There is no focal tenderness or step off.  there is no midline tenderness there are no lesions noted. there is no CVA tenderness Musculoskeletal: No lower extremity tenderness, no upper extremity tenderness. No joint effusions, no DVT signs strong distal pulses no edema Neurologic:  Normal speech and language. No gross focal neurologic deficits are  appreciated.  Skin:  Skin is warm, dry and intact. No rash noted. Psychiatric: Mood and affect are normal. Speech and behavior are normal.  ____________________________________________   LABS (all labs ordered are listed, but only abnormal results are displayed)  Labs Reviewed - No data to display  Pertinent labs  results that were available during my care of the patient were reviewed by me and considered in my medical decision making (see chart for details). ____________________________________________  EKG  I personally interpreted any EKGs ordered by me or triage Sinus rhythm rate 56 bpm no acute ST elevation or depression, nonspecific ST change, interventricular conduction delay consistent with prior EKGs, compared to last EKG, ST depressions laterally of greatly improved. ____________________________________________  RADIOLOGY  Pertinent labs & imaging results that were available during my care of the patient were reviewed by me and considered in my medical decision making (see chart for details). If possible, patient and/or family made aware of any abnormal findings.  No results found. ____________________________________________     PROCEDURES  Procedure(s) performed: None  Procedures  Critical Care performed: None  ____________________________________________   INITIAL IMPRESSION / ASSESSMENT AND PLAN / ED COURSE  Pertinent labs & imaging results that were available during my care of the patient were reviewed by me and considered in my medical decision making (see chart for details).  Patient here with reproducible chest wall pain after lifting up heavy pots last night is been going on since she lifted pots last night, will obtain basic blood work including cardiac enzymes and chest x-ray low suspicion for PE or dissection no radiation of the pain no pleuritic component, very reproducible.  Low suspicion also for ACS but given her age and history we will check that out.  I also get a INR level and we will reassess    ____________________________________________   FINAL CLINICAL IMPRESSION(S) / ED DIAGNOSES  Final diagnoses:  None      This chart was dictated using voice recognition software.  Despite best efforts to proofread,  errors can occur which can change meaning.      Schuyler Amor, MD 08/21/18 1011    Schuyler Amor, MD 08/21/18 1012

## 2018-08-21 NOTE — ED Triage Notes (Signed)
Pt to ER from home with c/o left sided chest pain that started last night.  Pt with noted point tenderness on palpation.

## 2018-09-09 DIAGNOSIS — I480 Paroxysmal atrial fibrillation: Secondary | ICD-10-CM | POA: Diagnosis not present

## 2018-09-20 NOTE — Progress Notes (Signed)
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  INTERVAL HISTORY: Patient returns to clinic today for routine 34-monthfollow-up.  [facility] but she states these have now resolved.  She currently feels well and is at her baseline.  She states she is continuing her letrozole as prescribed. She continues to be highly anxious.  She does not complain of weakness or fatigue today.  She has no neurologic complaints.  She denies any recent fevers or illnesses.  She has a good appetite and denies weight loss.  She denies any nausea, vomiting, constipation, or diarrhea.  She has no urinary complaints.  Patient offers no specific complaints today.  REVIEW OF SYSTEMS:   Review of Systems  Constitutional: Negative.  Negative for chills, fever, malaise/fatigue and weight loss.  HENT: Negative for congestion.   Respiratory: Negative.  Negative for cough and shortness of breath.   Cardiovascular: Negative.  Negative for chest pain and leg swelling.  Gastrointestinal: Negative.  Negative for abdominal pain, constipation, diarrhea, heartburn, nausea and vomiting.  Genitourinary: Negative.  Negative for frequency, hematuria and urgency.  Musculoskeletal: Negative.  Negative for falls and neck pain.  Neurological: Negative.  Negative for sensory change, focal weakness, weakness and headaches.  Psychiatric/Behavioral: Negative for memory loss. The patient is nervous/anxious. The patient does not have insomnia.     As per HPI. Otherwise, a  complete review of systems is negative.  PAST MEDICAL HISTORY: Past Medical History:  Diagnosis Date  . Anxiety   . Anxiety   . Arthritis   . Breast cancer ([place] 2014   bilateral invasive mammary carcinoma. with 36 rad tx.   . Breast cancer ([place]   . Cancer ([place]    breast  . Depression   . Depression   . Hard of hearing   . Hyperlipemia   . Hypertension   . IBS (irritable bowel syndrome)   . Memory loss   . Osteoporosis   . Wears dentures    full upper, partial lower    PAST SURGICAL HISTORY: Past Surgical History:  Procedure Laterality Date  . APPENDECTOMY    . BREAST BIOPSY Bilateral 2014  . BREAST LUMPECTOMY Bilateral 05/2013  . BREAST SURGERY Bilateral   . CATARACT EXTRACTION W/PHACO Right 12/11/2017   Procedure: CATARACT EXTRACTION PHACO AND INTRAOCULAR LENS PLACEMENT ([place] COMPLIC[place] RIGHT;  Surgeon: BLeandrew Koyanagi MD;  [place]  Service: Ophthalmology;  Laterality: Right;  . COLON SURGERY     12 in. removed due to polyps  . LEFT HEART CATH AND CORONARY ANGIOGRAPHY N/A 05/20/2018   Procedure: LEFT HEART CATH AND CORONARY ANGIOGRAPHY;  Surgeon: KCorey Skains MD;  [facility];  Service: Cardiovascular;  Laterality: N/A;  . TUBAL LIGATION      FAMILY HISTORY Family History  Problem Relation Age of Onset  . Breast cancer Daughter 347 . Breast cancer Daughter 427 . Cirrhosis Mother   . Lung cancer Father        ADVANCED DIRECTIVES:    HEALTH MAINTENANCE: Social History   Tobacco Use  . Smoking  status: Former Smoker    Packs/day: 1.00    Years: 60.00    Pack years: 60.00    Types: Cigarettes    Last attempt to quit: 12/2017    Years since quitting: 0.7  . Smokeless tobacco: Never Used  . Tobacco comment: smoking cessation materials not required  Substance Use Topics  . Alcohol use: Not Currently    Alcohol/week: 0.0 standard drinks  . Drug use: No     Colonoscopy:  PAP:  Bone  density:  Lipid panel:  Allergies  Allergen Reactions  . Iodine Nausea Only    Betadine okay   . Erythromycin Rash  . Penicillins Rash    Has patient had a PCN reaction causing immediate rash, facial/tongue/throat swelling, SOB or lightheadedness with hypotension: No Has patient had a PCN reaction causing severe rash involving mucus membranes or skin necrosis: No Has patient had a PCN reaction that required hospitalization: No Has patient had a PCN reaction occurring within the last 10 years: No If all of the above answers are "NO", then may proceed with Cephalosporin use.   . Shellfish Allergy Rash    Current Outpatient Medications  Medication Sig Dispense Refill  . atorvastatin (LIPITOR) 80 MG tablet Take 80 mg by mouth daily.    Marland Kitchen buPROPion (WELLBUTRIN) 100 MG tablet Take 1 tablet (100 mg total) by mouth 2 (two) times daily. 60 tablet 6  . gabapentin (NEURONTIN) 300 MG capsule Take 300 mg by mouth 2 (two) times daily. neurology    . isosorbide mononitrate (IMDUR) 30 MG 24 hr tablet Take 30 mg by mouth daily.    Marland Kitchen letrozole (FEMARA) 2.5 MG tablet TAKE 1 TABLET BY MOUTH EVERY DAY (Patient taking differently: Take 2.5 mg by mouth daily. ) 90 tablet 1  . losartan (COZAAR) 25 MG tablet Take 25 mg by mouth daily.  11  . magnesium oxide (MAG-OX) 400 MG tablet Take 1 tablet by mouth 1 day or 1 dose.    . metoprolol succinate (TOPROL-XL) 25 MG 24 hr tablet Take 25 mg by mouth daily.    . nitroGLYCERIN (NITROGLYN) 2 % ointment Apply 1 inch topically every 6 (six) hours. 30 g 0  . nitroGLYCERIN (NITROSTAT) 0.4 MG SL tablet Place 0.4 mg under the tongue every 5 (five) minutes as needed for chest pain.    . pantoprazole (PROTONIX) 40 MG tablet Take 1 tablet by mouth 2 (two) times daily.    . potassium chloride (K-DUR,KLOR-CON) 10 MEQ tablet Take 10 mEq by mouth daily.    Marland Kitchen saccharomyces boulardii (FLORASTOR) 250 MG capsule Take 250 mg by mouth daily.     . sacubitril-valsartan (ENTRESTO)  49-51 MG Take by mouth.    . warfarin (COUMADIN) 3 MG tablet Take by mouth.    . warfarin (COUMADIN) 4 MG tablet Take 4 tablets by mouth at bedtime.     . torsemide (DEMADEX) 10 MG tablet Take 10 mg by mouth daily.     No current facility-administered medications for this visit.     OBJECTIVE: Vitals:   09/26/18 1056  BP: 108/61  Pulse: (!) 57  Temp: (!) 97.3 F (36.3 C)     Body mass index is 23.64 kg/m.    ECOG FS:0 - Asymptomatic  General: Well-developed, well-nourished, no acute distress. Eyes: Pink conjunctiva, anicteric sclera. HEENT: Normocephalic, moist mucous membranes, clear oropharnyx. Breast: Patient requested exam be deferred today. Lungs: Clear to auscultation bilaterally. Heart: Regular rate and rhythm. No rubs, murmurs, or gallops. Abdomen:  Soft, nontender, nondistended. No organomegaly noted, normoactive bowel sounds. Musculoskeletal: No edema, cyanosis, or clubbing. Neuro: Alert, answering all questions appropriately. Cranial nerves grossly intact. Skin: No rashes or petechiae noted. Psych: Normal affect.  LAB RESULTS:  Lab Results  Component Value Date   NA 140 08/21/2018   K 3.9 08/21/2018   CL 107 08/21/2018   CO2 26 08/21/2018   GLUCOSE 107 (H) 08/21/2018   BUN 26 (H) 08/21/2018   CREATININE 1.07 (H) 08/21/2018   CALCIUM 8.6 (L) 08/21/2018   PROT 7.3 12/25/2016   ALBUMIN 3.9 12/25/2016   AST 17 12/25/2016   ALT 9 (L) 12/25/2016   ALKPHOS 63 12/25/2016   BILITOT 0.6 12/25/2016   GFRNONAA 48 (L) 08/21/2018   GFRAA 56 (L) 08/21/2018    Lab Results  Component Value Date   WBC 5.6 08/21/2018   NEUTROABS 2.8 08/21/2018   HGB 10.7 (L) 08/21/2018   HCT 34.5 (L) 08/21/2018   MCV 86.9 08/21/2018   PLT 286 08/21/2018     STUDIES: Dg Bone Density  Result Date: 09/22/2018 EXAM: DUAL X-RAY ABSORPTIOMETRY (DXA) FOR BONE MINERAL DENSITY IMPRESSION: crr Your patient Latarsha Zani completed a BMD test on 09/22/2018 using the [employer] (analysis version: 14.10) manufactured by [employer]. The following summarizes the results of our evaluation. PATIENT BIOGRAPHICAL: Name: Brittanyann, Wittner Patient ID: [id number] Birth Date: 10-Jul-1934 Height: 65.5 in. Gender: Female Exam Date: 09/22/2018 Weight: 150.6 lbs. Indications: Advanced Age, Caucasian, Height Loss, high risk meds, History of Fracture (Adult), hx breast ca, Osteoarthritis, POSTmenopausal, Recurrent Falls Fractures: bil ankles, left wrist Treatments: GABAPENTIN, letrozole, plavix, WARFARIN ASSESSMENT: The BMD measured at Forearm Radius 33% is 0.593 g/cm2 with a T-score of -3.2. This patient is considered osteoporotic according to [facility]) criteria. Scan quality is good. Site Region Measured Measured WHO Young Adult BMD Date       Age      Classification T-score AP Spine L1-L4 09/22/2018 82.8 Normal -1.0 1.066 g/cm2 DualFemur Neck Left 09/22/2018 82.8 Osteopenia -2.2 0.730 g/cm2 Right Forearm Radius 33% 09/22/2018 82.8 Osteoporosis -3.2 0.593 g/cm2 World Health Organization [facility]) criteria for post-menopausal, Caucasian Women: Normal:       T-score at or above -1 SD Osteopenia:   T-score between -1 and -2.5 SD Osteoporosis: T-score at or below -2.5 SD RECOMMENDATIONS: 1. All patients should optimize calcium and vitamin D intake. 2. Consider FDA-approved medical therapies in postmenopausal women and men aged 60 years and older, based on the following: a. A hip or vertebral (clinical or morphometric) fracture b. T-score < -2.5 at the femoral neck or spine after appropriate evaluation to exclude secondary causes c. Low bone mass (T-score between -1.0 and -2.5 at the femoral neck or spine) and a 10-year probability of a hip fracture > 3% or a 10-year probability of a major osteoporosis-related fracture > 20% based on the [place]-adapted WHO algorithm d. Clinician judgment and/or patient preferences may indicate treatment for people with 10-year fracture probabilities  above or below these levels FOLLOW-UP: Patients with diagnosed cases of osteoporosis or at high risk for fracture should have regular bone mineral density tests. For patients eligible for Medicare, routine testing is allowed once every 2 years. The testing frequency can be increased to one year for patients who have rapidly progressing disease, those who are receiving or discontinuing medical therapy to restore bone mass, or have additional risk factors. I have reviewed this report, and agree with the above findings. [facility] Radiology  Electronically Signed   By: Lowella Grip III M.D.   On: 09/22/2018 13:26   Mm Diag Breast Tomo Bilateral  Result Date: 09/23/2018 CLINICAL DATA:  History of treated bilateral breast cancer post lumpectomy and radiation therapy in 2014.The patient is asymptomatic. EXAM: DIGITAL DIAGNOSTIC BILATERAL MAMMOGRAM WITH CAD AND TOMO COMPARISON:  Previous exam(s). ACR Breast Density Category b: There are scattered areas of fibroglandular density. FINDINGS: Mammographically, there are no new suspicious masses, areas of nonsurgical architectural distortion or microcalcifications in either breast. Stable postsurgical changes in both breasts. Mammographic images were processed with CAD. IMPRESSION: No mammographic evidence of malignancy in either breast post bilateral lumpectomies. RECOMMENDATION: Screening mammogram in one year.(Code:SM-B-01Y) I have discussed the findings and recommendations with the patient. Results were also provided in writing at the conclusion of the visit. If applicable, a reminder letter will be sent to the patient regarding the next appointment. BI-RADS CATEGORY  2: Benign. Electronically Signed   By: Fidela Salisbury M.D.   On: 09/23/2018 11:30    ASSESSMENT: Bilateral synchronous pathologic stage Ia ER/PR+, HER-2 negative adenocarcinoma of the breasts with Oncotype scores of 23 and 24 which is intermediate risk.  PLAN:    1. Bilateral breast cancer:   Previously after lengthy discussion with patient and her family about her intermediate Oncotype score, she declined adjuvant chemotherapy.  Unclear of patient's compliance with letrozole, but she completed 5 years of treatment in January 2020.  She has been instructed to finish her current prescription and then discontinue.  We once again discussed the possibility of extending treatment, but patient declined.  Her most recent mammogram on September 23, 2018 was reported as BI-RADS 2, repeat in January 2021.  Return to clinic in 1 year for routine evaluation. 2.  Osteopenia: Patient's most recent bone mineral density on September 22, 2018 reported T score of -3.2.  Discontinue letrozole as above.  Will forward results to primary care for further evaluation and treatment.   Patient was seen and evaluated independently and I agree with the assessment and plan as dictated above.  Lloyd Huger, MD 09/26/18 1:08 PM

## 2018-09-22 ENCOUNTER — Encounter (INDEPENDENT_AMBULATORY_CARE_PROVIDER_SITE_OTHER): Payer: Self-pay

## 2018-09-22 ENCOUNTER — Ambulatory Visit
Admission: RE | Admit: 2018-09-22 | Discharge: 2018-09-22 | Disposition: A | Discharge status: Home / Self Care | Payer: PPO | Source: Ambulatory Visit | Attending: Oncology | Admitting: Oncology

## 2018-09-22 DIAGNOSIS — C50919 Malignant neoplasm of unspecified site of unspecified female breast: Secondary | ICD-10-CM | POA: Insufficient documentation

## 2018-09-22 DIAGNOSIS — M810 Age-related osteoporosis without current pathological fracture: Secondary | ICD-10-CM | POA: Diagnosis not present

## 2018-09-22 DIAGNOSIS — Z780 Asymptomatic menopausal state: Secondary | ICD-10-CM | POA: Diagnosis not present

## 2018-09-22 DIAGNOSIS — M8589 Other specified disorders of bone density and structure, multiple sites: Secondary | ICD-10-CM | POA: Diagnosis not present

## 2018-09-23 ENCOUNTER — Ambulatory Visit
Admission: RE | Admit: 2018-09-23 | Discharge: 2018-09-23 | Disposition: A | Discharge status: Home / Self Care | Payer: PPO | Source: Ambulatory Visit | Attending: Oncology | Admitting: Oncology

## 2018-09-23 DIAGNOSIS — C50919 Malignant neoplasm of unspecified site of unspecified female breast: Secondary | ICD-10-CM

## 2018-09-23 DIAGNOSIS — R928 Other abnormal and inconclusive findings on diagnostic imaging of breast: Secondary | ICD-10-CM | POA: Diagnosis not present

## 2018-09-26 ENCOUNTER — Inpatient Hospital Stay: Payer: PPO | Attending: Oncology | Admitting: Oncology

## 2018-09-26 ENCOUNTER — Other Ambulatory Visit: Payer: Self-pay

## 2018-09-26 VITALS — BP 108/61 | HR 57 | Temp 97.3°F | Ht 67.0 in | Wt 150.9 lb

## 2018-09-26 DIAGNOSIS — Z87891 Personal history of nicotine dependence: Secondary | ICD-10-CM | POA: Diagnosis not present

## 2018-09-26 DIAGNOSIS — Z923 Personal history of irradiation: Secondary | ICD-10-CM | POA: Diagnosis not present

## 2018-09-26 DIAGNOSIS — I10 Essential (primary) hypertension: Secondary | ICD-10-CM | POA: Diagnosis not present

## 2018-09-26 DIAGNOSIS — C50912 Malignant neoplasm of unspecified site of left female breast: Secondary | ICD-10-CM

## 2018-09-26 DIAGNOSIS — Z79899 Other long term (current) drug therapy: Secondary | ICD-10-CM | POA: Diagnosis not present

## 2018-09-26 DIAGNOSIS — Z853 Personal history of malignant neoplasm of breast: Secondary | ICD-10-CM | POA: Diagnosis not present

## 2018-09-26 DIAGNOSIS — M8580 Other specified disorders of bone density and structure, unspecified site: Secondary | ICD-10-CM | POA: Diagnosis not present

## 2018-09-26 DIAGNOSIS — Z8719 Personal history of other diseases of the digestive system: Secondary | ICD-10-CM | POA: Insufficient documentation

## 2018-09-26 DIAGNOSIS — C50911 Malignant neoplasm of unspecified site of right female breast: Secondary | ICD-10-CM

## 2018-09-26 DIAGNOSIS — Z170 Estrogen receptor positive status [ER+]: Secondary | ICD-10-CM | POA: Insufficient documentation

## 2018-09-26 NOTE — Progress Notes (Signed)
Patient is here today to follow up on her Bilateral malignant neoplasm of breast. Patient went to the hospital and was diagnosed with A-Fib on 08/21/2018 at [facility]. Patient stated that she had been doing well ever since. Patient had seen her cardiologist-Dr. Jalene Mullet.

## 2018-10-23 DIAGNOSIS — Z7901 Long term (current) use of anticoagulants: Secondary | ICD-10-CM | POA: Diagnosis not present

## 2018-10-23 DIAGNOSIS — H5052 Exophoria: Secondary | ICD-10-CM | POA: Diagnosis not present

## 2018-10-31 ENCOUNTER — Ambulatory Visit: Payer: PPO | Admitting: Oncology

## 2018-11-01 ENCOUNTER — Other Ambulatory Visit: Payer: Self-pay | Admitting: Family Medicine

## 2018-11-01 DIAGNOSIS — F17210 Nicotine dependence, cigarettes, uncomplicated: Secondary | ICD-10-CM

## 2018-11-01 DIAGNOSIS — F3341 Major depressive disorder, recurrent, in partial remission: Secondary | ICD-10-CM

## 2018-12-02 ENCOUNTER — Other Ambulatory Visit: Payer: Self-pay | Admitting: Family Medicine

## 2018-12-02 DIAGNOSIS — F17210 Nicotine dependence, cigarettes, uncomplicated: Secondary | ICD-10-CM

## 2018-12-02 DIAGNOSIS — F3341 Major depressive disorder, recurrent, in partial remission: Secondary | ICD-10-CM

## 2018-12-08 DIAGNOSIS — I2510 Atherosclerotic heart disease of native coronary artery without angina pectoris: Secondary | ICD-10-CM | POA: Diagnosis not present

## 2018-12-29 ENCOUNTER — Other Ambulatory Visit: Payer: Self-pay

## 2018-12-29 DIAGNOSIS — R197 Diarrhea, unspecified: Secondary | ICD-10-CM | POA: Diagnosis not present

## 2018-12-29 DIAGNOSIS — H9190 Unspecified hearing loss, unspecified ear: Secondary | ICD-10-CM | POA: Diagnosis not present

## 2018-12-29 DIAGNOSIS — I5042 Chronic combined systolic (congestive) and diastolic (congestive) heart failure: Secondary | ICD-10-CM | POA: Diagnosis not present

## 2018-12-29 DIAGNOSIS — J029 Acute pharyngitis, unspecified: Secondary | ICD-10-CM | POA: Diagnosis not present

## 2018-12-29 DIAGNOSIS — Z882 Allergy status to sulfonamides status: Secondary | ICD-10-CM | POA: Diagnosis not present

## 2018-12-29 DIAGNOSIS — Z7901 Long term (current) use of anticoagulants: Secondary | ICD-10-CM | POA: Diagnosis not present

## 2018-12-29 DIAGNOSIS — Z880 Allergy status to penicillin: Secondary | ICD-10-CM | POA: Diagnosis not present

## 2018-12-29 DIAGNOSIS — I255 Ischemic cardiomyopathy: Secondary | ICD-10-CM | POA: Diagnosis not present

## 2018-12-29 DIAGNOSIS — D649 Anemia, unspecified: Secondary | ICD-10-CM | POA: Diagnosis not present

## 2018-12-29 DIAGNOSIS — I739 Peripheral vascular disease, unspecified: Secondary | ICD-10-CM | POA: Diagnosis not present

## 2018-12-29 DIAGNOSIS — Z20828 Contact with and (suspected) exposure to other viral communicable diseases: Secondary | ICD-10-CM | POA: Diagnosis not present

## 2018-12-29 DIAGNOSIS — K31811 Angiodysplasia of stomach and duodenum with bleeding: Secondary | ICD-10-CM | POA: Diagnosis not present

## 2018-12-29 DIAGNOSIS — Z87891 Personal history of nicotine dependence: Secondary | ICD-10-CM | POA: Diagnosis not present

## 2018-12-29 DIAGNOSIS — I272 Pulmonary hypertension, unspecified: Secondary | ICD-10-CM | POA: Diagnosis not present

## 2018-12-29 DIAGNOSIS — Z8719 Personal history of other diseases of the digestive system: Secondary | ICD-10-CM | POA: Diagnosis not present

## 2018-12-29 DIAGNOSIS — D62 Acute posthemorrhagic anemia: Secondary | ICD-10-CM | POA: Diagnosis not present

## 2018-12-29 DIAGNOSIS — Z8673 Personal history of transient ischemic attack (TIA), and cerebral infarction without residual deficits: Secondary | ICD-10-CM | POA: Diagnosis not present

## 2018-12-29 DIAGNOSIS — G8929 Other chronic pain: Secondary | ICD-10-CM | POA: Diagnosis not present

## 2018-12-29 DIAGNOSIS — K589 Irritable bowel syndrome without diarrhea: Secondary | ICD-10-CM | POA: Diagnosis not present

## 2018-12-29 DIAGNOSIS — Z9049 Acquired absence of other specified parts of digestive tract: Secondary | ICD-10-CM | POA: Diagnosis not present

## 2018-12-29 DIAGNOSIS — K921 Melena: Secondary | ICD-10-CM | POA: Diagnosis not present

## 2018-12-29 DIAGNOSIS — K922 Gastrointestinal hemorrhage, unspecified: Secondary | ICD-10-CM | POA: Diagnosis not present

## 2018-12-29 DIAGNOSIS — I447 Left bundle-branch block, unspecified: Secondary | ICD-10-CM | POA: Diagnosis not present

## 2018-12-29 DIAGNOSIS — E785 Hyperlipidemia, unspecified: Secondary | ICD-10-CM | POA: Diagnosis not present

## 2018-12-29 DIAGNOSIS — Z9221 Personal history of antineoplastic chemotherapy: Secondary | ICD-10-CM | POA: Diagnosis not present

## 2018-12-29 DIAGNOSIS — I2510 Atherosclerotic heart disease of native coronary artery without angina pectoris: Secondary | ICD-10-CM | POA: Diagnosis not present

## 2018-12-29 DIAGNOSIS — K31819 Angiodysplasia of stomach and duodenum without bleeding: Secondary | ICD-10-CM | POA: Diagnosis not present

## 2018-12-29 DIAGNOSIS — R110 Nausea: Secondary | ICD-10-CM | POA: Diagnosis not present

## 2018-12-29 DIAGNOSIS — F339 Major depressive disorder, recurrent, unspecified: Secondary | ICD-10-CM | POA: Diagnosis not present

## 2018-12-29 DIAGNOSIS — Z853 Personal history of malignant neoplasm of breast: Secondary | ICD-10-CM | POA: Diagnosis not present

## 2018-12-29 DIAGNOSIS — K644 Residual hemorrhoidal skin tags: Secondary | ICD-10-CM | POA: Diagnosis not present

## 2018-12-29 DIAGNOSIS — I110 Hypertensive heart disease with heart failure: Secondary | ICD-10-CM | POA: Diagnosis not present

## 2018-12-29 DIAGNOSIS — I480 Paroxysmal atrial fibrillation: Secondary | ICD-10-CM | POA: Diagnosis not present

## 2018-12-29 DIAGNOSIS — R42 Dizziness and giddiness: Secondary | ICD-10-CM | POA: Diagnosis not present

## 2018-12-29 DIAGNOSIS — R710 Precipitous drop in hematocrit: Secondary | ICD-10-CM | POA: Diagnosis not present

## 2018-12-29 DIAGNOSIS — I252 Old myocardial infarction: Secondary | ICD-10-CM | POA: Diagnosis not present

## 2018-12-29 DIAGNOSIS — I440 Atrioventricular block, first degree: Secondary | ICD-10-CM | POA: Diagnosis not present

## 2018-12-29 NOTE — Patient Outreach (Signed)
[name]) Care Management  12/29/2018  [name] 1933/10/01 [id number]  Nurse Call Line Referral Date: 12/29/18 Reason for Referral:  Patient is requesting nurse follow up every 2 weeks for lab work Nurse call line recommendation: Patient refused triage  Patient is enrolled in [name]. Patient was referred to [facility] nurse case manager.  Response Email received from Babs Bertin, BSN, Chief Strategy Officer.  [name] states she will reach out to patient regarding referral request   [name] [facility] Telephonic  [phone]

## 2018-12-30 ENCOUNTER — Other Ambulatory Visit: Payer: Self-pay

## 2018-12-31 ENCOUNTER — Other Ambulatory Visit: Payer: Self-pay

## 2018-12-31 ENCOUNTER — Ambulatory Visit (INDEPENDENT_AMBULATORY_CARE_PROVIDER_SITE_OTHER): Payer: PPO | Admitting: Family Medicine

## 2018-12-31 ENCOUNTER — Encounter: Payer: Self-pay | Admitting: Family Medicine

## 2018-12-31 VITALS — BP 100/44 | HR 69 | Ht 67.0 in | Wt 151.0 lb

## 2018-12-31 DIAGNOSIS — K922 Gastrointestinal hemorrhage, unspecified: Secondary | ICD-10-CM

## 2018-12-31 DIAGNOSIS — Z7901 Long term (current) use of anticoagulants: Secondary | ICD-10-CM | POA: Diagnosis not present

## 2018-12-31 DIAGNOSIS — R710 Precipitous drop in hematocrit: Secondary | ICD-10-CM

## 2018-12-31 DIAGNOSIS — R42 Dizziness and giddiness: Secondary | ICD-10-CM

## 2018-12-31 DIAGNOSIS — I2510 Atherosclerotic heart disease of native coronary artery without angina pectoris: Secondary | ICD-10-CM | POA: Diagnosis not present

## 2018-12-31 NOTE — Patient Outreach (Signed)
[facility]) Care Management  12/31/2018  Imanni Burdine Dec 07, 1933 [id number]   Care coordination:  Received voice mail message from patients primary MD office, Baxter Flattery with Dr. Otilio Miu office. Baxter Flattery states patient was seen by Dr. Ronnald Ramp today and found to have a hemoglobin of 7.9, blood pressure of 100/44 and black stools. Baxter Flattery states Dr. Ronnald Ramp sent patient to [facility] to be evaluated.Marland Kitchen  RNCM informed Baxter Flattery she would notify patients nurse care manager with [place], Babs Bertin, RN  RNCM contacted Babs Bertin and provided update on patients status.   PLAN; No further follow up needed by this RNCM at this time.   [name] RN,BSN,CCM [facility] Telephonic  [phone]

## 2018-12-31 NOTE — Progress Notes (Addendum)
Date:  12/31/2018   Name:  Veronica Ellis   DOB:  1934/06/15   MRN:  [id number]   Chief Complaint: Melena (was having black stools last week- told to go to the er- didn't go. No black stools today but on Sat- was black and looked like coffee grounds)  Patient having upper gi  Bleed/melana.n Recently d/c coumadin. Hgb 12 to 7.9. Orthostatic dizziness. Will proceed to er. Has 3 -vessel CAD.   Review of Systems  Constitutional: Positive for diaphoresis and fatigue. Negative for chills, fever and unexpected weight change.  HENT: Negative for congestion, ear discharge, ear pain, rhinorrhea, sinus pressure, sneezing and sore throat.   Eyes: Negative for photophobia, pain, discharge, redness and itching.  Respiratory: Negative for cough, shortness of breath, wheezing and stridor.   Gastrointestinal: Positive for blood in stool. Negative for abdominal pain, constipation, diarrhea, nausea and vomiting.       Melana  Endocrine: Negative for cold intolerance, heat intolerance, polydipsia, polyphagia and polyuria.  Genitourinary: Negative for dysuria, flank pain, frequency, hematuria, menstrual problem, pelvic pain, urgency, vaginal bleeding and vaginal discharge.  Musculoskeletal: Negative for arthralgias, back pain and myalgias.  Skin: Negative for rash.  Allergic/Immunologic: Negative for environmental allergies and food allergies.  Neurological: Negative for dizziness, weakness, light-headedness, numbness and headaches.  Hematological: Negative for adenopathy. Does not bruise/bleed easily.  Psychiatric/Behavioral: Negative for dysphoric mood. The patient is not nervous/anxious.     Patient Active Problem List   Diagnosis Date Noted  . History of IBS 09/26/2018  . Long term (current) use of anticoagulants 07/17/2018  . 3-vessel CAD 07/08/2018  . Chronic combined systolic and diastolic CHF (congestive heart failure) ([place]) 07/08/2018  . Ischemic cardiomyopathy 07/08/2018  . AKI (acute  kidney injury) ([place]) 06/06/2018  . Hyperkalemia 06/05/2018  . Atrial fibrillation with RVR ([place]) 05/21/2018  . Microcytic anemia 05/21/2018  . NSTEMI (non-ST elevated myocardial infarction) ([place]) 05/19/2018  . Acute on chronic systolic heart failure ([place]) 07/30/2017  . LBBB (left bundle branch block) 07/10/2017  . Lumbar radiculopathy 05/16/2017  . Hx of low back pain 05/14/2017  . Ataxia 12/31/2016  . Memory loss 12/31/2016  . Recurrent major depressive disorder, in partial remission ([place]) 12/31/2016  . Bipolar 1 disorder, mixed, moderate ([place]) 12/25/2016  . Chronic bilateral low back pain with bilateral sciatica 10/18/2016  . Bilateral breast cancer ([place]) 02/20/2016  . 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 Cerebrovascular disease 02/16/2015  . Anxiety 02/16/2015  . Breast lump 02/16/2015  . 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 Routine general medical examination at a health care facility 02/16/2015  . Recurrent major depressive episodes ([place]) 02/16/2015  . Below normal amount of sodium in the blood 02/16/2015  . Pre-operative cardiovascular examination 02/16/2015    Allergies  Allergen Reactions  . Iodine Nausea Only    Betadine okay   . Erythromycin Rash  . Penicillins Rash    Has patient had a PCN reaction causing immediate rash, facial/tongue/throat swelling, SOB or lightheadedness with hypotension: No Has patient had a PCN reaction causing severe rash involving mucus membranes or skin necrosis: No Has patient had a PCN reaction that required hospitalization: No Has patient had a PCN reaction occurring within the last 10 years: No If all of the above answers are "NO", then may proceed with Cephalosporin use.   Marland Kitchen Shellfish Allergy Rash    Past Surgical History:  Procedure Laterality Date  . APPENDECTOMY    .  BREAST BIOPSY Bilateral 2014  . BREAST LUMPECTOMY Bilateral 05/2013  . BREAST SURGERY  Bilateral   . CATARACT EXTRACTION W/PHACO Right 12/11/2017   Procedure: CATARACT EXTRACTION PHACO AND INTRAOCULAR LENS PLACEMENT ([place]) COMPLICATED RIGHT;  Surgeon: Leandrew Koyanagi, MD;  Location: [place];  Service: Ophthalmology;  Laterality: Right;  . COLON SURGERY     12 in. removed due to polyps  . LEFT HEART CATH AND CORONARY ANGIOGRAPHY N/A 05/20/2018   Procedure: LEFT HEART CATH AND CORONARY ANGIOGRAPHY;  Surgeon: Corey Skains, MD;  Location: [place] [facility];  Service: Cardiovascular;  Laterality: N/A;  . TUBAL LIGATION      Social History   Tobacco Use  . Smoking status: Former Smoker    Packs/day: 1.00    Years: 60.00    Pack years: 60.00    Types: Cigarettes    Last attempt to quit: 12/2017    Years since quitting: 1.0  . Smokeless tobacco: Never Used  . Tobacco comment: smoking cessation materials not required  Substance Use Topics  . Alcohol use: Not Currently    Alcohol/week: 0.0 standard drinks  . Drug use: No     Medication list has been reviewed and updated.  Current Meds  Medication Sig  . atorvastatin (LIPITOR) 80 MG tablet Take 80 mg by mouth daily.  Marland Kitchen buPROPion (WELLBUTRIN) 100 MG tablet TAKE 1 TABLET BY MOUTH TWICE A DAY  . gabapentin (NEURONTIN) 300 MG capsule Take 300 mg by mouth 2 (two) times daily. neurology  . metoprolol succinate (TOPROL-XL) 25 MG 24 hr tablet Take 25 mg by mouth daily.  . nitroGLYCERIN (NITROSTAT) 0.4 MG SL tablet Place 0.4 mg under the tongue every 5 (five) minutes as needed for chest pain.  Marland Kitchen omeprazole (PRILOSEC) 40 MG capsule Take 1 capsule by mouth 2 (two) times daily. [place]  . sacubitril-valsartan (ENTRESTO) 49-51 MG Take 1 tablet by mouth 2 (two) times daily. Dr Domingo Cocking  . spironolactone (ALDACTONE) 25 MG tablet Take 1 tablet by mouth daily.  . [DISCONTINUED] magnesium oxide (MAG-OX) 400 MG tablet Take 1 tablet by mouth 1 day or 1 dose.    PHQ 2/9 Scores 03/03/2018 12/26/2017 02/25/2017 02/13/2016   PHQ - 2 Score 4 1 0 0  PHQ- 9 Score 11 2 - -    BP Readings from Last 3 Encounters:  12/31/18 (!) 100/44  09/26/18 108/61  08/21/18 123/65    Physical Exam Vitals signs and nursing note reviewed.  Constitutional:      General: She is not in acute distress.    Appearance: She is not diaphoretic.  HENT:     Head: Normocephalic and atraumatic.     Right Ear: External ear normal.     Left Ear: External ear normal.     Nose: Nose normal.  Eyes:     General:        Right eye: No discharge.        Left eye: No discharge.     Conjunctiva/sclera: Conjunctivae normal.     Pupils: Pupils are equal, round, and reactive to light.  Neck:     Musculoskeletal: Normal range of motion and neck supple.     Thyroid: No thyromegaly.     Vascular: No JVD.  Cardiovascular:     Rate and Rhythm: Normal rate and regular rhythm.     Heart sounds: Normal heart sounds. No murmur. No friction rub. No gallop.   Pulmonary:     Effort: Pulmonary effort is normal.  Breath sounds: Normal breath sounds.  Abdominal:     General: Bowel sounds are normal.     Palpations: Abdomen is soft. There is no mass.     Tenderness: There is no abdominal tenderness. There is no guarding.  Musculoskeletal: Normal range of motion.  Lymphadenopathy:     Cervical: No cervical adenopathy.  Skin:    General: Skin is warm and dry.  Neurological:     Mental Status: She is alert.     Deep Tendon Reflexes: Reflexes are normal and symmetric.     Wt Readings from Last 3 Encounters:  12/31/18 151 lb (68.5 kg)  09/26/18 150 lb 14.5 oz (68.5 kg)  08/21/18 146 lb (66.2 kg)    BP (!) 100/44   Pulse 69   Ht 5\' 7"  (1.702 m)   Wt 151 lb (68.5 kg)   SpO2 99%   BMI 23.65 kg/m   Assessment and Plan:  1. Acute upper GI bleed Patient began having having last week the onset of melena.  The course of the weekend patient is gradually becoming more more dizzy particularly with orthostatic changes. 2. Anticoagulated  Patient on Coumadin for atrial fibrillation.  Last PT/INR noted to be in the mid 2 range however this had not been checked as I could find until most recently it was noted to be in therapeutic range and not elevated out of anticoagulation range.  Patient has stopped her Coumadin per instruction.  3. Decreased hemoglobin Most recent hemoglobin was noted to be 7.9 which is decreased from around 12 4. Orthostatic dizziness Over the past 2 to 3 days patient has had dizziness when she stands.  Pressure is also noted to be decreased this is consistent with orthostatic dizziness due to hypovolemia.  5. 3-vessel CAD Patient recently discharged for three-vessel coronary artery disease for which she is followed by cardiology at [facility]. To er for evaluation

## 2019-01-05 ENCOUNTER — Telehealth: Payer: Self-pay

## 2019-01-05 NOTE — Telephone Encounter (Signed)
Transition Care Management Follow-up Telephone Call  Date of discharge and from where: 01/02/19 [facility]  How have you been since you were released from the hospital? Pt states she is okay, c/o upper and lower back pain. She also still feels a little dizzy at times.   Any questions or concerns? No   Items Reviewed:  Did the pt receive and understand the discharge instructions provided? Yes   Medications obtained and verified? No pt declined reviewing meds on the phone and stated she will bring her list to her appt.   Any new allergies since your discharge? No   Dietary orders reviewed? Yes  Do you have support at home? Yes   Functional Questionnaire: (I = Independent and D = Dependent) ADLs: I  Bathing/Dressing- I  Meal Prep- I  Eating- I  Maintaining continence- I  Transferring/Ambulation- I  Managing Meds- I  Follow up appointments reviewed:   PCP Hospital f/u appt confirmed? Yes  Scheduled to see Dr. Ronnald Ramp on 01/08/19 @ 9:00.  Are transportation arrangements needed? No   If their condition worsens, is the pt aware to call PCP or go to the Emergency Dept.? Yes  Was the patient provided with contact information for the PCP's office or ED? Yes  Was to pt encouraged to call back with questions or concerns? Yes

## 2019-01-08 ENCOUNTER — Encounter: Payer: Self-pay | Admitting: Family Medicine

## 2019-01-08 ENCOUNTER — Other Ambulatory Visit: Payer: Self-pay

## 2019-01-08 ENCOUNTER — Ambulatory Visit (INDEPENDENT_AMBULATORY_CARE_PROVIDER_SITE_OTHER): Payer: PPO | Admitting: Family Medicine

## 2019-01-08 VITALS — BP 120/72 | HR 72 | Ht 67.0 in | Wt 155.0 lb

## 2019-01-08 DIAGNOSIS — Z8719 Personal history of other diseases of the digestive system: Secondary | ICD-10-CM

## 2019-01-08 DIAGNOSIS — E875 Hyperkalemia: Secondary | ICD-10-CM

## 2019-01-08 DIAGNOSIS — Z09 Encounter for follow-up examination after completed treatment for conditions other than malignant neoplasm: Secondary | ICD-10-CM | POA: Diagnosis not present

## 2019-01-08 NOTE — Progress Notes (Signed)
Date:  01/08/2019   Name:  Veronica Ellis   DOB:  1934-05-17   MRN:  [id number]   Chief Complaint: Follow-up (hosp- upper GI bleed)  Pt was recently admitted to [facility] on 12/31/18 for gi bleed and was discharged on 01/02/19. Transition of care call placed on 01/05/19.   GI Problem  Primary symptoms do not include fever, weight loss, fatigue, abdominal pain, nausea, vomiting, diarrhea, melena, hematemesis, jaundice, hematochezia, dysuria, myalgias, arthralgias or rash.  The illness does not include chills, anorexia, dysphagia, odynophagia, bloating, constipation, tenesmus, back pain or itching. Associated medical issues do not include inflammatory bowel disease, GERD, alcohol abuse, hemorrhoids or diverticulitis.    Review of Systems  Constitutional: Negative.  Negative for chills, fatigue, fever, unexpected weight change and weight loss.  HENT: Negative for congestion, ear discharge, ear pain, mouth sores, nosebleeds, postnasal drip, rhinorrhea, sinus pressure, sneezing and sore throat.   Eyes: Negative for photophobia, pain, discharge, redness and itching.  Respiratory: Negative for cough, choking, chest tightness, shortness of breath, wheezing and stridor.   Cardiovascular: Negative for chest pain, palpitations and leg swelling.  Gastrointestinal: Negative for abdominal pain, anorexia, bloating, blood in stool, constipation, diarrhea, dysphagia, hematemesis, hematochezia, jaundice, melena, nausea and vomiting.  Endocrine: Negative for cold intolerance, heat intolerance, polydipsia, polyphagia and polyuria.  Genitourinary: Negative for dysuria, flank pain, frequency, hematuria, menstrual problem, pelvic pain, urgency, vaginal bleeding and vaginal discharge.  Musculoskeletal: Negative for arthralgias, back pain and myalgias.  Skin: Negative for itching and rash.  Allergic/Immunologic: Negative for environmental allergies and food allergies.  Neurological: Negative for dizziness, weakness,  light-headedness, numbness and headaches.  Hematological: Negative for adenopathy. Does not bruise/bleed easily.  Psychiatric/Behavioral: Negative for dysphoric mood. The patient is not nervous/anxious.     Patient Active Problem List   Diagnosis Date Noted  . History of IBS 09/26/2018  . Long term (current) use of anticoagulants 07/17/2018  . 3-vessel CAD 07/08/2018  . Chronic combined systolic and diastolic CHF (congestive heart failure) ([place]) 07/08/2018  . Ischemic cardiomyopathy 07/08/2018  . AKI (acute kidney injury) ([place]) 06/06/2018  . Hyperkalemia 06/05/2018  . Atrial fibrillation with RVR ([place]) 05/21/2018  . Microcytic anemia 05/21/2018  . NSTEMI (non-ST elevated myocardial infarction) ([place]) 05/19/2018  . Acute on chronic systolic heart failure ([place]) 07/30/2017  . LBBB (left bundle branch block) 07/10/2017  . Lumbar radiculopathy 05/16/2017  . Hx of low back pain 05/14/2017  . Ataxia 12/31/2016  . Memory loss 12/31/2016  . Recurrent major depressive disorder, in partial remission ([place]) 12/31/2016  . Bipolar 1 disorder, mixed, moderate ([place]) 12/25/2016  . Chronic bilateral low back pain with bilateral sciatica 10/18/2016  . Bilateral breast cancer ([place]) 02/20/2016  . 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 Cerebrovascular disease 02/16/2015  . Anxiety 02/16/2015  . Breast lump 02/16/2015  . 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[place]) 02/16/2015  . 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 Routine general medical examination at a health care facility 02/16/2015  . Recurrent major depressive episodes ([place]) 02/16/2015  . Below normal amount of sodium in the blood 02/16/2015  . Pre-operative cardiovascular examination 02/16/2015    Allergies  Allergen Reactions  . Iodine Nausea Only    Betadine okay   . Erythromycin Rash  . Penicillins Rash    Has patient had a PCN reaction causing immediate rash, facial/tongue/throat swelling, SOB or lightheadedness with  hypotension: No Has patient had a PCN reaction causing severe rash involving mucus membranes or skin necrosis: No Has patient  had a PCN reaction that required hospitalization: No Has patient had a PCN reaction occurring within the last 10 years: No If all of the above answers are "NO", then may proceed with Cephalosporin use.   Marland Kitchen Shellfish Allergy Rash    Past Surgical History:  Procedure Laterality Date  . APPENDECTOMY    . BREAST BIOPSY Bilateral 2014  . BREAST LUMPECTOMY Bilateral 05/2013  . BREAST SURGERY Bilateral   . CATARACT EXTRACTION W/PHACO Right 12/11/2017   Procedure: CATARACT EXTRACTION PHACO AND INTRAOCULAR LENS PLACEMENT ([place]) COMPLICATED RIGHT;  Surgeon: Leandrew Koyanagi, MD;  Location: [facility];  Service: Ophthalmology;  Laterality: Right;  . COLON SURGERY     12 in. removed due to polyps  . LEFT HEART CATH AND CORONARY ANGIOGRAPHY N/A 05/20/2018   Procedure: LEFT HEART CATH AND CORONARY ANGIOGRAPHY;  Surgeon: Corey Skains, MD;  Location: [facility];  Service: Cardiovascular;  Laterality: N/A;  . TUBAL LIGATION      Social History   Tobacco Use  . Smoking status: Former Smoker    Packs/day: 1.00    Years: 60.00    Pack years: 60.00    Types: Cigarettes    Last attempt to quit: 12/2017    Years since quitting: 1.0  . Smokeless tobacco: Never Used  . Tobacco comment: smoking cessation materials not required  Substance Use Topics  . Alcohol use: Not Currently    Alcohol/week: 0.0 standard drinks  . Drug use: No     Medication list has been reviewed and updated.  Current Meds  Medication Sig  . atorvastatin (LIPITOR) 80 MG tablet Take 80 mg by mouth daily.  Marland Kitchen buPROPion (WELLBUTRIN) 100 MG tablet TAKE 1 TABLET BY MOUTH TWICE A DAY  . metoprolol succinate (TOPROL-XL) 25 MG 24 hr tablet Take 25 mg by mouth daily.  . nitroGLYCERIN (NITROSTAT) 0.4 MG SL tablet Place 0.4 mg under the tongue every 5 (five) minutes as needed for chest  pain.  . pantoprazole (PROTONIX) 40 MG tablet Take 1 tablet by mouth 2 (two) times daily.  . sacubitril-valsartan (ENTRESTO) 49-51 MG Take 1 tablet by mouth 2 (two) times daily. Dr Domingo Cocking  . spironolactone (ALDACTONE) 25 MG tablet Take 1 tablet by mouth daily.  Marland Kitchen torsemide (DEMADEX) 10 MG tablet Take 1 tablet by mouth daily.  Marland Kitchen warfarin (COUMADIN) 1 MG tablet     PHQ 2/9 Scores 03/03/2018 12/26/2017 02/25/2017 02/13/2016  PHQ - 2 Score 4 1 0 0  PHQ- 9 Score 11 2 - -    BP Readings from Last 3 Encounters:  01/08/19 120/72  12/31/18 (!) 100/44  09/26/18 108/61    Physical Exam Vitals signs and nursing note reviewed.  Constitutional:      General: She is not in acute distress.    Appearance: She is not diaphoretic.  HENT:     Head: Normocephalic and atraumatic.     Right Ear: Tympanic membrane, ear canal and external ear normal.     Left Ear: Tympanic membrane, ear canal and external ear normal.     Nose: Nose normal. No congestion.  Eyes:     General:        Right eye: No discharge.        Left eye: No discharge.     Conjunctiva/sclera: Conjunctivae normal.     Pupils: Pupils are equal, round, and reactive to light.  Neck:     Musculoskeletal: Normal range of motion and neck supple.  Thyroid: No thyromegaly.     Vascular: No JVD.  Cardiovascular:     Rate and Rhythm: Normal rate and regular rhythm.     Heart sounds: Normal heart sounds, S1 normal and S2 normal. No murmur. No systolic murmur. No diastolic murmur. No friction rub. No gallop. No S3 or S4 sounds.   Pulmonary:     Effort: Pulmonary effort is normal.     Breath sounds: Normal breath sounds. No wheezing or rhonchi.  Abdominal:     General: Bowel sounds are normal.     Palpations: Abdomen is soft. There is no mass.     Tenderness: There is no abdominal tenderness. There is no guarding.  Musculoskeletal: Normal range of motion.     Right lower leg: No edema.     Left lower leg: No edema.  Lymphadenopathy:      Cervical: No cervical adenopathy.  Skin:    General: Skin is warm and dry.  Neurological:     Mental Status: She is alert.     Deep Tendon Reflexes: Reflexes are normal and symmetric.     Wt Readings from Last 3 Encounters:  01/08/19 155 lb (70.3 kg)  12/31/18 151 lb (68.5 kg)  09/26/18 150 lb 14.5 oz (68.5 kg)    BP 120/72   Pulse 72   Ht 5\' 7"  (1.702 m)   Wt 155 lb (70.3 kg)   BMI 24.28 kg/m   Assessment and Plan:  1. Hospital discharge follow-up Patient is follow-up transition of care from hospital from admission for upper GI bleed.  And is stable with no evidence of melena will check CBC and renal function. - Renal Function Panel  2. H/O: upper GI bleed Patient had a duodenal areas that were actively bleeding these were directed and patient has not had any further bleeding episodes patient also received 2 units of blood by her recollection.  Is feeling much better - Renal Function Panel - CBC with Differential/Platelet  3. Hyperkalemia Has a history of hyperkalemia will check a renal function panel. - Renal Function Panel

## 2019-01-09 LAB — CBC WITH DIFFERENTIAL/PLATELET
Basophils Absolute: 0.1 10*3/uL (ref 0.0–0.2)
Basos: 1 %
EOS (ABSOLUTE): 0.3 10*3/uL (ref 0.0–0.4)
Eos: 4 %
Hematocrit: 32.1 % — ABNORMAL LOW (ref 34.0–46.6)
Hemoglobin: 10.1 g/dL — ABNORMAL LOW (ref 11.1–15.9)
Immature Grans (Abs): 0 10*3/uL (ref 0.0–0.1)
Immature Granulocytes: 0 %
Lymphocytes Absolute: 1.7 10*3/uL (ref 0.7–3.1)
Lymphs: 23 %
MCH: 25.9 pg — ABNORMAL LOW (ref 26.6–33.0)
MCHC: 31.5 g/dL (ref 31.5–35.7)
MCV: 82 fL (ref 79–97)
Monocytes Absolute: 0.8 10*3/uL (ref 0.1–0.9)
Monocytes: 11 %
Neutrophils Absolute: 4.5 10*3/uL (ref 1.4–7.0)
Neutrophils: 61 %
Platelets: 349 10*3/uL (ref 150–450)
RBC: 3.9 x10E6/uL (ref 3.77–5.28)
RDW: 18.5 % — ABNORMAL HIGH (ref 11.7–15.4)
WBC: 7.3 10*3/uL (ref 3.4–10.8)

## 2019-01-09 LAB — RENAL FUNCTION PANEL
Albumin: 4.2 g/dL (ref 3.6–4.6)
BUN/Creatinine Ratio: 20 (ref 12–28)
BUN: 19 mg/dL (ref 8–27)
CO2: 22 mmol/L (ref 20–29)
Calcium: 9 mg/dL (ref 8.7–10.3)
Chloride: 107 mmol/L — ABNORMAL HIGH (ref 96–106)
Creatinine, Ser: 0.96 mg/dL (ref 0.57–1.00)
GFR calc Af Amer: 63 mL/min/{1.73_m2} (ref >59)
GFR calc non Af Amer: 55 mL/min/{1.73_m2} — ABNORMAL LOW (ref >59)
Glucose: 99 mg/dL (ref 65–99)
Phosphorus: 3.6 mg/dL (ref 3.0–4.3)
Potassium: 4.8 mmol/L (ref 3.5–5.2)
Sodium: 140 mmol/L (ref 134–144)

## 2019-02-09 ENCOUNTER — Other Ambulatory Visit: Payer: Self-pay

## 2019-02-09 DIAGNOSIS — Z8719 Personal history of other diseases of the digestive system: Secondary | ICD-10-CM

## 2019-02-09 MED ORDER — PANTOPRAZOLE SODIUM 40 MG PO TBEC: 40.0000 mg | DELAYED_RELEASE_TABLET | Freq: Two times a day (BID) | ORAL | 5 refills | Status: DC

## 2019-02-09 NOTE — Progress Notes (Unsigned)
Sent over pantoprazole refill

## 2019-02-25 ENCOUNTER — Other Ambulatory Visit: Payer: Self-pay | Admitting: Family Medicine

## 2019-02-25 DIAGNOSIS — F3341 Major depressive disorder, recurrent, in partial remission: Secondary | ICD-10-CM

## 2019-02-25 DIAGNOSIS — F17210 Nicotine dependence, cigarettes, uncomplicated: Secondary | ICD-10-CM

## 2019-04-17 ENCOUNTER — Emergency Department: Payer: PPO

## 2019-04-17 ENCOUNTER — Encounter: Payer: Self-pay | Admitting: Emergency Medicine

## 2019-04-17 ENCOUNTER — Other Ambulatory Visit: Payer: Self-pay

## 2019-04-17 ENCOUNTER — Emergency Department: Admission: EM | Admit: 2019-04-17 | Discharge: 2019-04-17 | Discharge status: Absent without leave | Payer: PPO

## 2019-04-17 ENCOUNTER — Emergency Department
Admission: EM | Admit: 2019-04-17 | Discharge: 2019-04-17 | Disposition: A | Discharge status: Home / Self Care | Payer: PPO | Attending: Emergency Medicine | Admitting: Emergency Medicine

## 2019-04-17 DIAGNOSIS — W19XXXA Unspecified fall, initial encounter: Secondary | ICD-10-CM | POA: Diagnosis not present

## 2019-04-17 DIAGNOSIS — Y939 Activity, unspecified: Secondary | ICD-10-CM | POA: Insufficient documentation

## 2019-04-17 DIAGNOSIS — S0990XA Unspecified injury of head, initial encounter: Secondary | ICD-10-CM | POA: Diagnosis not present

## 2019-04-17 DIAGNOSIS — I10 Essential (primary) hypertension: Secondary | ICD-10-CM | POA: Insufficient documentation

## 2019-04-17 DIAGNOSIS — Y929 Unspecified place or not applicable: Secondary | ICD-10-CM | POA: Insufficient documentation

## 2019-04-17 DIAGNOSIS — Z853 Personal history of malignant neoplasm of breast: Secondary | ICD-10-CM | POA: Insufficient documentation

## 2019-04-17 DIAGNOSIS — Z87891 Personal history of nicotine dependence: Secondary | ICD-10-CM | POA: Diagnosis not present

## 2019-04-17 DIAGNOSIS — Z79899 Other long term (current) drug therapy: Secondary | ICD-10-CM | POA: Diagnosis not present

## 2019-04-17 DIAGNOSIS — R51 Headache: Secondary | ICD-10-CM | POA: Diagnosis not present

## 2019-04-17 DIAGNOSIS — Y999 Unspecified external cause status: Secondary | ICD-10-CM | POA: Insufficient documentation

## 2019-04-17 DIAGNOSIS — M542 Cervicalgia: Secondary | ICD-10-CM | POA: Diagnosis not present

## 2019-04-17 DIAGNOSIS — Z7901 Long term (current) use of anticoagulants: Secondary | ICD-10-CM | POA: Insufficient documentation

## 2019-04-17 LAB — CBC
HCT: 42.2 % (ref 36.0–46.0)
Hemoglobin: 13.5 g/dL (ref 12.0–15.0)
MCH: 29 pg (ref 26.0–34.0)
MCHC: 32 g/dL (ref 30.0–36.0)
MCV: 90.8 fL (ref 80.0–100.0)
Platelets: 281 10*3/uL (ref 150–400)
RBC: 4.65 MIL/uL (ref 3.87–5.11)
RDW: 15.3 % (ref 11.5–15.5)
WBC: 6.3 10*3/uL (ref 4.0–10.5)
nRBC: 0 % (ref 0.0–0.2)

## 2019-04-17 LAB — PROTIME-INR
INR: 2.2 — ABNORMAL HIGH (ref 0.8–1.2)
Prothrombin Time: 23.9 seconds — ABNORMAL HIGH (ref 11.4–15.2)

## 2019-04-17 LAB — BASIC METABOLIC PANEL
Anion gap: 9 (ref 5–15)
BUN: 20 mg/dL (ref 8–23)
CO2: 22 mmol/L (ref 22–32)
Calcium: 8.7 mg/dL — ABNORMAL LOW (ref 8.9–10.3)
Chloride: 109 mmol/L (ref 98–111)
Creatinine, Ser: 1.12 mg/dL — ABNORMAL HIGH (ref 0.44–1.00)
GFR calc Af Amer: 53 mL/min — ABNORMAL LOW (ref >60)
GFR calc non Af Amer: 45 mL/min — ABNORMAL LOW (ref >60)
Glucose, Bld: 108 mg/dL — ABNORMAL HIGH (ref 70–99)
Potassium: 4.3 mmol/L (ref 3.5–5.1)
Sodium: 140 mmol/L (ref 135–145)

## 2019-04-17 NOTE — Discharge Instructions (Addendum)
Please seek medical attention for any high fevers, chest pain, shortness of breath, change in behavior, persistent vomiting, bloody stool or any other new or concerning symptoms.  

## 2019-04-17 NOTE — ED Notes (Signed)
E-signature pad unavailable at this time, discharge instructions given and pt voices understanding of teaching

## 2019-04-17 NOTE — ED Notes (Signed)
Pt ambulatory to bathroom with one assist.

## 2019-04-17 NOTE — ED Triage Notes (Signed)
Pt presents to ED c/o fall in bathroom. Pt states she tripped over something while in bathroom and hit head on toilet. Takes coumadin. Reports dizziness prior to fall. Referred by PCP for head CT.

## 2019-04-17 NOTE — ED Provider Notes (Signed)
[facility] Emergency Department Provider Note  ____________________________________________   I have reviewed the triage vital signs and the nursing notes.   HISTORY  Chief Complaint Fall   History limited by: Not Limited   HPI Veronica Ellis is a 85 y.o. female who presents to the emergency department today at the suggestion of primary care physician to obtain head ct given that the patient is on warfarin and had a fall 6 days ago. The patient states that she has falls from time to time because of dizziness. The patient says that she did hit her head but denies any loss of consciousness. The patient has had some headache from the fall. Denies any change in vision or nausea or vomiting. She is also complaining of some left sided neck pain since the fall  Records reviewed. Per medical record review patient has a history of being on warfarin.  Past Medical History:  Diagnosis Date  . Anxiety   . Anxiety   . Arthritis   . Breast cancer ([place]) 2014   bilateral invasive mammary carcinoma. with 36 rad tx.   . Breast cancer ([place])   . Cancer ([place])    breast  . Depression   . Depression   . Hard of hearing   . Hyperlipemia   . Hypertension   . IBS (irritable bowel syndrome)   . Memory loss   . Osteoporosis   . Wears dentures    full upper, partial lower    Patient Active Problem List   Diagnosis Date Noted  . History of IBS 09/26/2018  . Long term (current) use of anticoagulants 07/17/2018  . 3-vessel CAD 07/08/2018  . Chronic combined systolic and diastolic CHF (congestive heart failure) ([place]) 07/08/2018  . Ischemic cardiomyopathy 07/08/2018  . AKI (acute kidney injury) ([place]) 06/06/2018  . Hyperkalemia 06/05/2018  . Atrial fibrillation with RVR ([place]) 05/21/2018  . Microcytic anemia 05/21/2018  . NSTEMI (non-ST elevated myocardial infarction) ([place]) 05/19/2018  . Acute on chronic systolic heart failure ([place]) 07/30/2017  . LBBB (left bundle  branch block) 07/10/2017  . Lumbar radiculopathy 05/16/2017  . Hx of low back pain 05/14/2017  . Ataxia 12/31/2016  . Memory loss 12/31/2016  . Recurrent major depressive disorder, in partial remission ([place]) 12/31/2016  . Bipolar 1 disorder, mixed, moderate ([place]) 12/25/2016  . Chronic bilateral low back pain with bilateral sciatica 10/18/2016  . Bilateral breast cancer ([place]) 02/20/2016  . 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 Cerebrovascular disease 02/16/2015  . Anxiety 02/16/2015  . Breast lump 02/16/2015  . 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[place]) 02/16/2015  . 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 Routine general medical examination at a health care facility 02/16/2015  . Recurrent major depressive episodes ([place]) 02/16/2015  . Below normal amount of sodium in the blood 02/16/2015  . Pre-operative cardiovascular examination 02/16/2015    Past Surgical History:  Procedure Laterality Date  . APPENDECTOMY    . BREAST BIOPSY Bilateral 2014  . BREAST LUMPECTOMY Bilateral 05/2013  . BREAST SURGERY Bilateral   . CATARACT EXTRACTION W/PHACO Right 12/11/2017   Procedure: CATARACT EXTRACTION PHACO AND INTRAOCULAR LENS PLACEMENT ([place]) COMPLICATED RIGHT;  Surgeon: Leandrew Koyanagi, MD;  Location: [facility];  Service: Ophthalmology;  Laterality: Right;  . COLON SURGERY     12 in. removed due to polyps  . LEFT HEART CATH AND CORONARY ANGIOGRAPHY N/A 05/20/2018   Procedure: LEFT HEART CATH AND CORONARY ANGIOGRAPHY;  Surgeon: Corey Skains, MD;  Location: [facility]  CV [facility];  Service: Cardiovascular;  Laterality: N/A;  . TUBAL LIGATION      Prior to Admission medications   Medication Sig Start Date End Date Taking? Authorizing Provider  atorvastatin (LIPITOR) 80 MG tablet Take 80 mg by mouth daily.    [provider]  buPROPion (WELLBUTRIN) 100 MG tablet TAKE 1 TABLET BY MOUTH TWICE A DAY 02/25/19   Juline Patch, MD  gabapentin (NEURONTIN) 300  MG capsule Take 300 mg by mouth 2 (two) times daily. neurology 10/11/16   [provider]  metoprolol succinate (TOPROL-XL) 25 MG 24 hr tablet Take 25 mg by mouth daily.    [provider]  nitroGLYCERIN (NITROSTAT) 0.4 MG SL tablet Place 0.4 mg under the tongue every 5 (five) minutes as needed for chest pain.    [provider]  pantoprazole (PROTONIX) 40 MG tablet Take 1 tablet (40 mg total) by mouth 2 (two) times daily. 02/09/19 02/09/20  Juline Patch, MD  saccharomyces boulardii (FLORASTOR) 250 MG capsule Take 250 mg by mouth daily.     [provider]  sacubitril-valsartan (ENTRESTO) 49-51 MG Take 1 tablet by mouth 2 (two) times daily. Dr Domingo Cocking 08/18/18   [provider]  spironolactone (ALDACTONE) 25 MG tablet Take 1 tablet by mouth daily. 12/08/18 12/08/19  [provider]  torsemide (DEMADEX) 10 MG tablet Take 1 tablet by mouth daily. 01/01/19   [provider]  warfarin (COUMADIN) 1 MG tablet  01/01/19   [provider]    Allergies Iodine, Erythromycin, Penicillins, and Shellfish allergy  Family History  Problem Relation Age of Onset  . Breast cancer Daughter 41  . Breast cancer Daughter 81  . Cirrhosis Mother   . Lung cancer Father     Social History Social History   Tobacco Use  . Smoking status: Former Smoker    Packs/day: 1.00    Years: 60.00    Pack years: 60.00    Types: Cigarettes    Quit date: 12/2017    Years since quitting: 1.3  . Smokeless tobacco: Never Used  . Tobacco comment: smoking cessation materials not required  Substance Use Topics  . Alcohol use: Not Currently    Alcohol/week: 0.0 standard drinks  . Drug use: No    Review of Systems Constitutional: No fever/chills Eyes: No visual changes. ENT: No sore throat. Cardiovascular: Denies chest pain. Respiratory: Denies shortness of breath. Gastrointestinal: No abdominal pain.  No nausea, no vomiting.  No diarrhea.    Genitourinary: Negative for dysuria. Musculoskeletal: Positive for neck pain. Skin: Negative for rash. Neurological: Positive for headache. ____________________________________________   PHYSICAL EXAM:  VITAL SIGNS: ED Triage Vitals  Enc Vitals Group     BP 04/17/19 1430 (!) 131/54     Pulse Rate 04/17/19 1430 (!) 56     Resp 04/17/19 1430 18     Temp 04/17/19 1430 98.8 F (37.1 C)     Temp Source 04/17/19 1430 Oral     SpO2 04/17/19 1430 99 %     Weight 04/17/19 1431 145 lb (65.8 kg)     Height 04/17/19 1431 5\' 7"  (1.702 m)     Head Circumference --      Peak Flow --      Pain Score 04/17/19 1431 6   Constitutional: Alert and oriented.  Eyes: Conjunctivae are normal.  ENT      Head: Normocephalic and atraumatic.      Nose: No congestion/rhinnorhea.  Mouth/Throat: Mucous membranes are moist.      Neck: No stridor. No midline tenderness. Hematological/Lymphatic/Immunilogical: No cervical lymphadenopathy. Cardiovascular: Normal rate, regular rhythm.  No murmurs, rubs, or gallops.  Respiratory: Normal respiratory effort without tachypnea nor retractions. Breath sounds are clear and equal bilaterally. No wheezes/rales/rhonchi. Gastrointestinal: Soft and non tender. No rebound. No guarding.  Genitourinary: Deferred Musculoskeletal: Normal range of motion in all extremities. No lower extremity edema. No spinal tenderness. Neurologic:  Normal speech and language. No gross focal neurologic deficits are appreciated.  Skin:  Skin is warm, dry and intact. No rash noted. Psychiatric: Mood and affect are normal. Speech and behavior are normal. Patient exhibits appropriate insight and judgment.  ____________________________________________    LABS (pertinent positives/negatives)  INR 2.2 CBC wbc 6.3, hgb 13.5, plt 281 BMP wnl except glu 108, cr 1.12, ca 8.7  ____________________________________________   EKG  I, Nance Pear, attending physician, personally viewed  and interpreted this EKG  EKG Time: 1443 Rate: 54 Rhythm: sinus bradycardia Axis: left axis deviation Intervals: qtc 449 QRS: LBBB ST changes: no st elevation Impression: abnormal ekg  ____________________________________________    RADIOLOGY  CT head No acute abnormality  ____________________________________________   PROCEDURES  Procedures  ____________________________________________   INITIAL IMPRESSION / ASSESSMENT AND PLAN / ED COURSE  Pertinent labs & imaging results that were available during my care of the patient were reviewed by me and considered in my medical decision making (see chart for details).   Patient presented for CT head per PCP's advice. Patient had fall 6 days ago. CT head negative. Also complaining of some left sided neck pain. No midline tenderness. Did discuss possible cervical injury and CT scan however patient felt comfortable deferring ct cervical spine at this time which I think is reasonable given lack of midline tenderness.   ____________________________________________   FINAL CLINICAL IMPRESSION(S) / ED DIAGNOSES  Final diagnoses:  Fall, initial encounter     Note: This dictation was prepared with Dragon dictation. Any transcriptional errors that result from this process are unintentional     Nance Pear, MD 04/17/19 [phone]

## 2019-04-27 DIAGNOSIS — I5042 Chronic combined systolic (congestive) and diastolic (congestive) heart failure: Secondary | ICD-10-CM | POA: Diagnosis not present

## 2019-04-27 DIAGNOSIS — Z5181 Encounter for therapeutic drug level monitoring: Secondary | ICD-10-CM | POA: Diagnosis not present

## 2019-04-27 DIAGNOSIS — Z8673 Personal history of transient ischemic attack (TIA), and cerebral infarction without residual deficits: Secondary | ICD-10-CM | POA: Diagnosis not present

## 2019-04-27 DIAGNOSIS — I2510 Atherosclerotic heart disease of native coronary artery without angina pectoris: Secondary | ICD-10-CM | POA: Diagnosis not present

## 2019-04-27 DIAGNOSIS — I4891 Unspecified atrial fibrillation: Secondary | ICD-10-CM | POA: Diagnosis not present

## 2019-04-27 DIAGNOSIS — I480 Paroxysmal atrial fibrillation: Secondary | ICD-10-CM | POA: Diagnosis not present

## 2019-04-27 DIAGNOSIS — E7800 Pure hypercholesterolemia, unspecified: Secondary | ICD-10-CM | POA: Diagnosis not present

## 2019-04-27 DIAGNOSIS — Z7901 Long term (current) use of anticoagulants: Secondary | ICD-10-CM | POA: Diagnosis not present

## 2019-04-27 DIAGNOSIS — I255 Ischemic cardiomyopathy: Secondary | ICD-10-CM | POA: Diagnosis not present

## 2019-05-07 ENCOUNTER — Other Ambulatory Visit: Payer: Self-pay

## 2019-05-07 MED ORDER — ENTRESTO 97-103 MG PO TABS: 0.5000 | ORAL_TABLET | Freq: Two times a day (BID) | ORAL | 0 refills | Status: DC

## 2019-05-11 ENCOUNTER — Encounter: Payer: Self-pay | Admitting: Family Medicine

## 2019-05-11 ENCOUNTER — Ambulatory Visit (INDEPENDENT_AMBULATORY_CARE_PROVIDER_SITE_OTHER): Payer: PPO | Admitting: Family Medicine

## 2019-05-11 ENCOUNTER — Other Ambulatory Visit: Payer: Self-pay

## 2019-05-11 VITALS — BP 120/60 | HR 64 | Ht 67.0 in | Wt 154.0 lb

## 2019-05-11 DIAGNOSIS — Z23 Encounter for immunization: Secondary | ICD-10-CM

## 2019-05-11 DIAGNOSIS — R9389 Abnormal findings on diagnostic imaging of other specified body structures: Secondary | ICD-10-CM | POA: Diagnosis not present

## 2019-05-11 DIAGNOSIS — F3341 Major depressive disorder, recurrent, in partial remission: Secondary | ICD-10-CM

## 2019-05-11 DIAGNOSIS — F17210 Nicotine dependence, cigarettes, uncomplicated: Secondary | ICD-10-CM

## 2019-05-11 DIAGNOSIS — R1319 Other dysphagia: Secondary | ICD-10-CM

## 2019-05-11 DIAGNOSIS — K219 Gastro-esophageal reflux disease without esophagitis: Secondary | ICD-10-CM | POA: Diagnosis not present

## 2019-05-11 DIAGNOSIS — Z8719 Personal history of other diseases of the digestive system: Secondary | ICD-10-CM

## 2019-05-11 DIAGNOSIS — R1310 Dysphagia, unspecified: Secondary | ICD-10-CM | POA: Diagnosis not present

## 2019-05-11 MED ORDER — BUPROPION HCL 100 MG PO TABS: 100.0000 mg | ORAL_TABLET | Freq: Two times a day (BID) | ORAL | 1 refills | Status: DC

## 2019-05-11 MED ORDER — PANTOPRAZOLE SODIUM 40 MG PO TBEC: 40.0000 mg | DELAYED_RELEASE_TABLET | Freq: Two times a day (BID) | ORAL | 5 refills | Status: DC

## 2019-05-11 NOTE — Progress Notes (Signed)
Date:  05/11/2019   Name:  Veronica Ellis   DOB:  [birth year]   MRN:  [id number]   Chief Complaint: Depression and Gastroesophageal Reflux  Depression        This is a chronic problem.  The current episode started more than 1 year ago.   The onset quality is sudden.   The problem occurs intermittently.  The problem has been waxing and waning since onset.  Associated symptoms include no decreased concentration, no fatigue, no helplessness, no hopelessness, does not have insomnia, not irritable, no restlessness, no decreased interest, no appetite change, no body aches, no myalgias, no headaches, no indigestion, not sad and no suicidal ideas.     The symptoms are aggravated by nothing.  Past treatments include SSRIs - Selective serotonin reuptake inhibitors.  Compliance with treatment is variable. Gastroesophageal Reflux She complains of dysphagia and heartburn. She reports no abdominal pain, no belching, no chest pain, no choking, no coughing, no early satiety, no globus sensation, no hoarse voice, no nausea, no sore throat, no stridor, no tooth decay, no water brash or no wheezing. This is a new problem. The current episode started in the past 7 days. The problem occurs constantly. The problem has been gradually improving. Pertinent negatives include no anemia, fatigue, melena, muscle weakness or orthopnea.    Review of Systems  Constitutional: Negative.  Negative for appetite change, chills, fatigue, fever and unexpected weight change.  HENT: Negative for congestion, ear discharge, ear pain, hoarse voice, rhinorrhea, sinus pressure, sneezing and sore throat.   Eyes: Negative for photophobia, pain, discharge, redness and itching.  Respiratory: Negative for cough, choking, shortness of breath, wheezing and stridor.   Cardiovascular: Negative for chest pain.  Gastrointestinal: Positive for dysphagia and heartburn. Negative for abdominal pain, blood in stool, constipation, diarrhea, melena,  nausea and vomiting.  Endocrine: Negative for cold intolerance, heat intolerance, polydipsia, polyphagia and polyuria.  Genitourinary: Negative for dysuria, flank pain, frequency, hematuria, menstrual problem, pelvic pain, urgency, vaginal bleeding and vaginal discharge.  Musculoskeletal: Negative for arthralgias, back pain, myalgias and muscle weakness.  Skin: Negative for rash.  Allergic/Immunologic: Negative for environmental allergies and food allergies.  Neurological: Negative for dizziness, weakness, light-headedness, numbness and headaches.  Hematological: Negative for adenopathy. Does not bruise/bleed easily.  Psychiatric/Behavioral: Positive for depression. Negative for decreased concentration, dysphoric mood and suicidal ideas. The patient is not nervous/anxious and does not have insomnia.     Patient Active Problem List   Diagnosis Date Noted  . History of IBS 09/26/2018  . Long term (current) use of anticoagulants 07/17/2018  . 3-vessel CAD 07/08/2018  . Chronic combined systolic and diastolic CHF (congestive heart failure) ([place]) 07/08/2018  . Ischemic cardiomyopathy 07/08/2018  . AKI (acute kidney injury) ([place]) 06/06/2018  . Hyperkalemia 06/05/2018  . Atrial fibrillation with RVR ([place]) 05/21/2018  . Microcytic anemia 05/21/2018  . NSTEMI (non-ST elevated myocardial infarction) ([place]) 05/19/2018  . Acute on chronic systolic heart failure ([place]) 07/30/2017  . LBBB (left bundle branch block) 07/10/2017  . Lumbar radiculopathy 05/16/2017  . Hx of low back pain 05/14/2017  . Ataxia 12/31/2016  . Memory loss 12/31/2016  . Recurrent major depressive disorder, in partial remission ([place]) 12/31/2016  . Bipolar 1 disorder, mixed, moderate ([place]) 12/25/2016  . Chronic bilateral low back pain with bilateral sciatica 10/18/2016  . Bilateral breast cancer ([place]) 02/20/2016  . 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 Cerebrovascular disease 02/16/2015  . Anxiety  02/16/2015  . Breast lump  02/16/2015  . 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[place]) 02/16/2015  . 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 Routine general medical examination at a health care facility 02/16/2015  . Recurrent major depressive episodes ([place]) 02/16/2015  . Below normal amount of sodium in the blood 02/16/2015  . Pre-operative cardiovascular examination 02/16/2015    Allergies  Allergen Reactions  . Iodine Nausea Only    Betadine okay   . Erythromycin Rash  . Penicillins Rash    Has patient had a PCN reaction causing immediate rash, facial/tongue/throat swelling, SOB or lightheadedness with hypotension: No Has patient had a PCN reaction causing severe rash involving mucus membranes or skin necrosis: No Has patient had a PCN reaction that required hospitalization: No Has patient had a PCN reaction occurring within the last 10 years: No If all of the above answers are "NO", then may proceed with Cephalosporin use.   Marland Kitchen Shellfish Allergy Rash    Past Surgical History:  Procedure Laterality Date  . APPENDECTOMY    . BREAST BIOPSY Bilateral 2014  . BREAST LUMPECTOMY Bilateral 05/2013  . BREAST SURGERY Bilateral   . CATARACT EXTRACTION W/PHACO Right 12/11/2017   Procedure: CATARACT EXTRACTION PHACO AND INTRAOCULAR LENS PLACEMENT ([facility]) COMPLICATED RIGHT;  Surgeon: Leandrew Koyanagi, MD;  Location: [facility];  Service: Ophthalmology;  Laterality: Right;  . COLON SURGERY     12 in. removed due to polyps  . LEFT HEART CATH AND CORONARY ANGIOGRAPHY N/A 05/20/2018   Procedure: LEFT HEART CATH AND CORONARY ANGIOGRAPHY;  Surgeon: Corey Skains, MD;  Location: [facility];  Service: Cardiovascular;  Laterality: N/A;  . TUBAL LIGATION      Social History   Tobacco Use  . Smoking status: Former Smoker    Packs/day: 1.00    Years: 60.00    Pack years: 60.00    Types: Cigarettes    Quit date: 12/2017    Years since quitting: 1.4  . Smokeless tobacco: Never Used   . Tobacco comment: smoking cessation materials not required  Substance Use Topics  . Alcohol use: Not Currently    Alcohol/week: 0.0 standard drinks  . Drug use: No     Medication list has been reviewed and updated.  Current Meds  Medication Sig  . atorvastatin (LIPITOR) 80 MG tablet Take 80 mg by mouth daily.  Marland Kitchen buPROPion (WELLBUTRIN) 100 MG tablet TAKE 1 TABLET BY MOUTH TWICE A DAY  . gabapentin (NEURONTIN) 300 MG capsule Take 300 mg by mouth 2 (two) times daily. neurology  . metoprolol succinate (TOPROL-XL) 25 MG 24 hr tablet Take 25 mg by mouth daily.  . nitroGLYCERIN (NITROSTAT) 0.4 MG SL tablet Place 0.4 mg under the tongue every 5 (five) minutes as needed for chest pain.  . pantoprazole (PROTONIX) 40 MG tablet Take 1 tablet (40 mg total) by mouth 2 (two) times daily.  Marland Kitchen saccharomyces boulardii (FLORASTOR) 250 MG capsule Take 250 mg by mouth daily.   . sacubitril-valsartan (ENTRESTO) 97-103 MG Take 0.5 tablets by mouth 2 (two) times daily. (Patient taking differently: Take 0.5 tablets by mouth 2 (two) times daily. Dr Domingo Cocking)  . spironolactone (ALDACTONE) 25 MG tablet Take 1 tablet by mouth daily.  Marland Kitchen torsemide (DEMADEX) 10 MG tablet Take 1 tablet by mouth daily.  Marland Kitchen warfarin (COUMADIN) 1 MG tablet     PHQ 2/9 Scores 05/11/2019 03/03/2018 12/26/2017 02/25/2017  PHQ - 2 Score 0 4 1 0  PHQ- 9 Score 0 11 2 -    BP Readings from  Last 3 Encounters:  05/11/19 120/60  04/17/19 (!) 131/54  01/08/19 120/72    Physical Exam Vitals signs and nursing note reviewed.  Constitutional:      General: She is not irritable.    Appearance: She is well-developed.  HENT:     Head: Normocephalic.     Right Ear: Tympanic membrane, ear canal and external ear normal.     Left Ear: Tympanic membrane, ear canal and external ear normal.     Nose: Nose normal.     Mouth/Throat:     Mouth: Mucous membranes are moist.  Eyes:     General: Lids are everted, no foreign bodies appreciated. No scleral  icterus.       Left eye: No foreign body or hordeolum.     Conjunctiva/sclera: Conjunctivae normal.     Right eye: Right conjunctiva is not injected.     Left eye: Left conjunctiva is not injected.     Pupils: Pupils are equal, round, and reactive to light.  Neck:     Musculoskeletal: Normal range of motion and neck supple.     Thyroid: No thyromegaly.     Vascular: No JVD.     Trachea: No tracheal deviation.  Cardiovascular:     Rate and Rhythm: Normal rate and regular rhythm.     Pulses: Normal pulses.     Heart sounds: Normal heart sounds. No murmur. No friction rub. No gallop.   Pulmonary:     Effort: Pulmonary effort is normal. No respiratory distress.     Breath sounds: Normal breath sounds. No wheezing or rales.  Abdominal:     General: Bowel sounds are normal.     Palpations: Abdomen is soft. There is no mass.     Tenderness: There is no abdominal tenderness. There is no guarding or rebound.  Musculoskeletal: Normal range of motion.        General: No tenderness.  Lymphadenopathy:     Cervical: No cervical adenopathy.  Skin:    General: Skin is warm.     Findings: No rash.  Neurological:     Mental Status: She is alert and oriented to person, place, and time.     Cranial Nerves: No cranial nerve deficit.     Deep Tendon Reflexes: Reflexes normal.  Psychiatric:        Mood and Affect: Mood is not anxious or depressed.     Wt Readings from Last 3 Encounters:  05/11/19 154 lb (69.9 kg)  04/17/19 145 lb (65.8 kg)  01/08/19 155 lb (70.3 kg)    BP 120/60   Pulse 64   Ht 5\' 7"  (1.702 m)   Wt 154 lb (69.9 kg)   BMI 24.12 kg/m   Assessment and Plan:   1. Cigarette nicotine dependence without complication Patient continues to say that she has not smoked for 2 years although I was pretty sure she smoked 2 months ago but patient will continue on bupropion because it has done well to keep her off the cigarettes and we will continue at current dosing of 100 mg 1 tablet  twice a day. - buPROPion (WELLBUTRIN) 100 MG tablet; Take 1 tablet (100 mg total) by mouth 2 (two) times daily.  Dispense: 180 tablet; Refill: 1  2. Recurrent major depressive disorder, in partial remission (HCC) Chronic.  Controlled.  Current PHQ is 0 we will continue bupropion 100 mg 1 twice a day daily. - buPROPion (WELLBUTRIN) 100 MG tablet; Take 1 tablet (100 mg total) by  mouth 2 (two) times daily.  Dispense: 180 tablet; Refill: 1  3. H/O: upper GI bleed Patient had a history of an upper GI bleed was followed by Dr. 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 - pantoprazole (PROTONIX) 40 MG tablet; Take 1 tablet (40 mg total) by mouth 2 (two) times daily.  Dispense: 60 tablet; Refill: 5  4. Abnormal chest CT Patient was noted to have in the latter part of 2019 an abnormal chest x-ray with lymphadenopathy.  This is thought to be secondary to congestive heart failure.  It was recommended to repeat and we will contact [facility] about having this to be scheduled.  Has only been cessation of tobacco for approximately a year she has a 60-pack-year history of smoking.  5. Esophageal dysphagia Patient has had recent esophageal dysphasia but on review she had endoscopy with no esophageal abnormality earlier this year per Dr. Clydene [facility]aming at [facility].  6. Gastroesophageal reflux disease, esophagitis presence not specified Patient will continue Protonix 40 mg twice a day.  7. Influenza vaccine needed Discussed and administered. - Flu Vaccine QUAD High Dose(Fluad)

## 2019-05-12 ENCOUNTER — Other Ambulatory Visit: Payer: Self-pay | Admitting: *Deleted

## 2019-05-12 DIAGNOSIS — R911 Solitary pulmonary nodule: Secondary | ICD-10-CM

## 2019-05-15 ENCOUNTER — Other Ambulatory Visit: Payer: Self-pay | Admitting: Oncology

## 2019-05-21 ENCOUNTER — Telehealth: Payer: Self-pay | Admitting: Family Medicine

## 2019-05-21 NOTE — Chronic Care Management (AMB) (Signed)
° °  05/21/2019 Name: Veronica Ellis MRN: [id number] DOB: 07-20-1934  Referred by: Juline Patch, MD Reason for referral : Chronic Care Management (Initial CCM outreach was unsuccessful. )   An unsuccessful telephone outreach was attempted today. The patient was referred to the case management team by for assistance with chronic care management and care coordination.   Follow Up Plan: A HIPPA compliant phone message was left for the patient providing contact information and requesting a return call.  The care management team will reach out to the patient again over the next 7 days.  If patient returns call to provider office, please advise to call [place] at [place]  [email or web address]   ??[phone]

## 2019-05-25 ENCOUNTER — Other Ambulatory Visit: Payer: Self-pay | Admitting: Oncology

## 2019-05-25 DIAGNOSIS — R911 Solitary pulmonary nodule: Secondary | ICD-10-CM

## 2019-05-25 NOTE — Chronic Care Management (AMB) (Signed)
° °  05/25/2019 Name: Veronica Ellis MRN: [id number] DOB: Jan 26, 1934  Veronica Ellis is a 85 y.o. year old female who is a primary care patient of Juline Patch, MD. I reached out to Ladora Daniel by phone today in response to a referral sent by Ms. Charniece [employer] health plan.    Veronica Ellis was given information about Chronic Care Management services today including:  1. CCM service includes personalized support from designated clinical staff supervised by her physician, including individualized plan of care and coordination with other care providers 2. 24/7 contact phone numbers for assistance for urgent and routine care needs. 3. Service will only be billed when office clinical staff spend 20 minutes or more in a month to coordinate care. 4. Only one practitioner may furnish and bill the service in a calendar month. 5. The patient may stop CCM services at any time (effective at the end of the month) by phone call to the office staff. 6. The patient will be responsible for cost sharing (co-pay) of up to 20% of the service fee (after annual deductible is met).  Patient agreed to services and verbal consent obtained.   Follow up plan: Telephone appointment with CCM team member scheduled for: 05/28/2019  [place]  [email or web address]   ??[phone]

## 2019-05-25 NOTE — Progress Notes (Signed)
  Pulmonary Nodule Clinic Telephone Note  Received referral from Dr. Ronnald Ramp  Patient was evaluated for shortness of breath and tachycardia.  She was admitted for non-STEMI with acute respiratory failure and hypoxia due to chronic systolic CHF.  Imaging revealed peripheral septal thickening and basilar groundglass opacities consistent with pulmonary edema.  Subpleural scarring in the anterior lungs likely postradiation change 5 mm nodule in the superior segment of left lower lobe that remained unchanged from prior.  Subpleural nodularity in the anterior right middle lobe appearing slightly more prominent than on prior exam.  Some additional nodules on prior exam are obscured.  Follow-up recommended.  I personally reviewed all patient's previous imaging: No additional imaging available for comparison.  I recommend follow-up with noncontrasted CT scan of the chest in approximately 1 month.  She currently is a non-smoker.  Unclear of quit date but within the last 2 months to 2 years.  Currently on Wellbutrin 100 mg twice daily.  She has a 60-pack-year history of smoking.  High risk factors include: History of heavy smoking, exposure to asbestos, radium or uranium, personal family history of lung cancer, older age, sex (females greater than males), race (black and native [place] greater than weight), marginal speculation, upper lobe location, multiplicity (less than 5 nodules increases risk for malignancy) and emphysema and/or pulmonary fibrosis.   This recommendation follows the consensus statement: Guidelines for Management of Incidental Pulmonary Nodules Detected on CT Images: From the Fleischner Society 2017; Radiology 2017; 284:228-243.    I have placed order for CT scan without contrast to be completed in about 1 month.  I will touch base with patient and schedule her virtually for results of the CT scan and recommendations per Fleischner's guidelines and our pulmonary nodule clinic.  Faythe Casa, NP 05/25/2019 9:43 AM

## 2019-05-26 ENCOUNTER — Telehealth: Payer: Self-pay | Admitting: *Deleted

## 2019-05-26 NOTE — Telephone Encounter (Signed)
Referral received for pt to be seen in Lung Nodule Clinic for further workup of incidental lung nodule with follow up CT scan. Left message with patient to call back to discuss clinic and review recommendations and upcoming appts including follow up CT scan and visit with Jenny Burns, NP to discuss results. Awaiting call back.  

## 2019-05-30 DIAGNOSIS — I440 Atrioventricular block, first degree: Secondary | ICD-10-CM | POA: Diagnosis not present

## 2019-05-30 DIAGNOSIS — N838 Other noninflammatory disorders of ovary, fallopian tube and broad ligament: Secondary | ICD-10-CM | POA: Diagnosis not present

## 2019-05-30 DIAGNOSIS — Z5181 Encounter for therapeutic drug level monitoring: Secondary | ICD-10-CM | POA: Diagnosis not present

## 2019-05-30 DIAGNOSIS — Z79899 Other long term (current) drug therapy: Secondary | ICD-10-CM | POA: Diagnosis not present

## 2019-05-30 DIAGNOSIS — F17210 Nicotine dependence, cigarettes, uncomplicated: Secondary | ICD-10-CM | POA: Diagnosis not present

## 2019-05-30 DIAGNOSIS — D649 Anemia, unspecified: Secondary | ICD-10-CM | POA: Diagnosis not present

## 2019-05-30 DIAGNOSIS — K921 Melena: Secondary | ICD-10-CM | POA: Diagnosis not present

## 2019-05-30 DIAGNOSIS — I447 Left bundle-branch block, unspecified: Secondary | ICD-10-CM | POA: Diagnosis not present

## 2019-05-30 DIAGNOSIS — R001 Bradycardia, unspecified: Secondary | ICD-10-CM | POA: Diagnosis not present

## 2019-05-30 DIAGNOSIS — R42 Dizziness and giddiness: Secondary | ICD-10-CM | POA: Diagnosis not present

## 2019-06-01 ENCOUNTER — Ambulatory Visit: Payer: PPO

## 2019-06-17 DIAGNOSIS — I255 Ischemic cardiomyopathy: Secondary | ICD-10-CM | POA: Diagnosis not present

## 2019-06-19 ENCOUNTER — Telehealth: Payer: Self-pay | Admitting: *Deleted

## 2019-06-19 NOTE — Telephone Encounter (Signed)
Pt has been made aware of upcoming appts for follow up CT scan and follow up appt with Jennifer Burns, NP in the Lung Nodule Clinic. Pt verbalized understanding. Nothing further needed at this time. 

## 2019-06-22 ENCOUNTER — Ambulatory Visit: Admission: RE | Admit: 2019-06-22 | Payer: PPO | Source: Ambulatory Visit

## 2019-06-22 ENCOUNTER — Ambulatory Visit: Payer: PPO

## 2019-06-23 ENCOUNTER — Inpatient Hospital Stay: Payer: PPO | Attending: Oncology | Admitting: Oncology

## 2019-06-23 ENCOUNTER — Other Ambulatory Visit: Payer: Self-pay

## 2019-06-23 DIAGNOSIS — R918 Other nonspecific abnormal finding of lung field: Secondary | ICD-10-CM | POA: Insufficient documentation

## 2019-06-30 ENCOUNTER — Other Ambulatory Visit: Payer: Self-pay

## 2019-06-30 ENCOUNTER — Ambulatory Visit
Admission: RE | Admit: 2019-06-30 | Discharge: 2019-06-30 | Disposition: A | Discharge status: Home / Self Care | Payer: PPO | Source: Ambulatory Visit | Attending: Oncology | Admitting: Oncology

## 2019-06-30 DIAGNOSIS — R911 Solitary pulmonary nodule: Secondary | ICD-10-CM | POA: Insufficient documentation

## 2019-06-30 DIAGNOSIS — R918 Other nonspecific abnormal finding of lung field: Secondary | ICD-10-CM | POA: Diagnosis not present

## 2019-07-01 ENCOUNTER — Inpatient Hospital Stay (HOSPITAL_BASED_OUTPATIENT_CLINIC_OR_DEPARTMENT_OTHER): Payer: PPO | Admitting: Oncology

## 2019-07-01 DIAGNOSIS — R911 Solitary pulmonary nodule: Secondary | ICD-10-CM

## 2019-07-01 NOTE — Progress Notes (Signed)
Pulmonary Nodule Clinic Consult note [facility]  [phone] [phone]  [name], MD as PCP - General (Family Medicine) Lloyd Huger, MD as Consulting Physician (Oncology) Minor, Dalbert Garnet, RN as [place] Management   Name of the patient: Veronica Ellis  [id number]  September 09, 1934   Date of visit: 07/01/2019   Diagnosis- Lung Nodule  Virtual Visit via Telephone Note   I connected with Veronica Ellis and son on 07/01/19 at 1pm by telephone visit and verified that I am speaking with the correct person using two identifiers.   I discussed the limitations, risks, security and privacy concerns of performing an evaluation and management service by telemedicine and the availability of in-person appointments. I also discussed with the patient that there may be a patient responsible charge related to this service. The patient expressed understanding and agreed to proceed.   Other persons participating in the visit and their role in the encounter:   Patient's location: Home  Provider's location: Office  Chief complaint/ Reason for visit- Pulmonary Nodule Clinic Initial Visit  Past Medical History:  Patient is managed/referred by Dr. Ronnald Ramp for lung nodules.   Interval history-patient was evaluated for shortness of breath and tachycardia and subsequently admitted for non-STEMI with acute respiratory failure and hypoxia due to chronic systolic CHF on [id number].  Imaging revealed peripheral septal thickening and basilar groundglass opacities consistent with pulmonary edema.  Subpleural scarring in anterior lungs likely postradiation change 5 mm nodule in superior segment of left lower lobe that remained unchanged from prior.  Subpleural nodularity in the anterior right middle lobe appearing slightly more prominent than on prior exam.  Some additional nodules on prior exam are obscured.  Follow-up was  recommended.  She presents today via telephone call for results of her recent CT scan.  States she has been doing well.  She is living at her son's house.  Per chart review she has past medical history positive for: Past Medical History:  Diagnosis Date  . Anxiety   . Anxiety   . Arthritis   . Breast cancer ([place]) 2014   bilateral invasive mammary carcinoma. with 36 rad tx.   . Breast cancer ([place])   . Cancer ([place])    breast  . Depression   . Depression   . Hard of hearing   . Hyperlipemia   . Hypertension   . IBS (irritable bowel syndrome)   . Memory loss   . Osteoporosis   . Wears dentures    full upper, partial lower    She has a past surgical history positive for: Past Surgical History:  Procedure Laterality Date  . APPENDECTOMY    . BREAST BIOPSY Bilateral 2014  . BREAST LUMPECTOMY Bilateral 05/2013  . BREAST SURGERY Bilateral   . CATARACT EXTRACTION W/PHACO Right 12/11/2017   Procedure: CATARACT EXTRACTION PHACO AND INTRAOCULAR LENS PLACEMENT ([place]) COMPLICATED RIGHT;  Surgeon: Leandrew Koyanagi, MD;  Location: [facility];  Service: Ophthalmology;  Laterality: Right;  . COLON SURGERY     12 in. removed due to polyps  . LEFT HEART CATH AND CORONARY ANGIOGRAPHY N/A 05/20/2018   Procedure: LEFT HEART CATH AND CORONARY ANGIOGRAPHY;  Surgeon: Corey Skains, MD;  Location: [facility];  Service: Cardiovascular;  Laterality: N/A;  . TUBAL LIGATION     She is currently not smoking.  She quit 1 to 2 years ago per her son.  60-pack-year history.  Patient cannot  remember quit date.  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  She denies any known exposures to cancer causing agents.  Has past family history positive for lung cancer, breast cancer. Most of her family has smoked.   In the interim, she has been doing well.  Son states she was recently evaluated by her cardiologist and "her heart is doing much better".  Her cardiologist is Dr.  Tommi Rumps with [facility] last seen on 06/19/2019.  Had a cardiac MRI that revealed her left ventricular ejection fraction had increased from 25% to 50% in approximately 1 year.  Given the significant improvement, she did not require a cardioverter or defibrillator at this time.  She was encouraged to continue current medications.  She currently denies any neurological complaints fevers or illness.  She denies any easy bleeding or bruising, good appetite and denies any weight loss.  She denies any chest pain, nausea, vomiting constipation or diarrhea.  She denies any urinary complaints.  She has occasional shortness of breath with exertion but otherwise does well.She has a history of COPD but does not wear oxygen.  She is currently on Wellbutrin 100 mg 1 tablet twice a day.   ECOG FS:1 - Symptomatic but completely ambulatory  Review of systems- Review of Systems  Constitutional: Positive for malaise/fatigue. Negative for chills, fever and weight loss.  HENT: Negative for congestion, ear pain and tinnitus.   Eyes: Negative.  Negative for blurred vision and double vision.  Respiratory: Positive for cough and shortness of breath. Negative for sputum production.   Cardiovascular: Negative.  Negative for chest pain, palpitations and leg swelling.  Gastrointestinal: Negative.  Negative for abdominal pain, constipation, diarrhea, nausea and vomiting.  Genitourinary: Negative for dysuria, frequency and urgency.  Musculoskeletal: Positive for myalgias. Negative for back pain and falls.  Skin: Negative.  Negative for rash.  Neurological: Negative.  Negative for weakness and headaches.  Endo/Heme/Allergies: Negative.  Does not bruise/bleed easily.  Psychiatric/Behavioral: Negative.  Negative for depression. The patient is not nervous/anxious and does not have insomnia.      Allergies  Allergen Reactions  . Iodine Nausea Only    Betadine okay   . Erythromycin Rash  . Penicillins Rash    Has patient  had a PCN reaction causing immediate rash, facial/tongue/throat swelling, SOB or lightheadedness with hypotension: No Has patient had a PCN reaction causing severe rash involving mucus membranes or skin necrosis: No Has patient had a PCN reaction that required hospitalization: No Has patient had a PCN reaction occurring within the last 10 years: No If all of the above answers are "NO", then may proceed with Cephalosporin use.   Marland Kitchen Shellfish Allergy Rash     Past Medical History:  Diagnosis Date  . Anxiety   . Anxiety   . Arthritis   . Breast cancer ([place]) 2014   bilateral invasive mammary carcinoma. with 36 rad tx.   . Breast cancer ([place])   . Cancer ([place])    breast  . Depression   . Depression   . Hard of hearing   . Hyperlipemia   . Hypertension   . IBS (irritable bowel syndrome)   . Memory loss   . Osteoporosis   . Wears dentures    full upper, partial lower     Past Surgical History:  Procedure Laterality Date  . APPENDECTOMY    . BREAST BIOPSY Bilateral 2014  . BREAST LUMPECTOMY Bilateral 05/2013  . BREAST SURGERY Bilateral   .  CATARACT EXTRACTION W/PHACO Right 12/11/2017   Procedure: CATARACT EXTRACTION PHACO AND INTRAOCULAR LENS PLACEMENT ([place]) COMPLICATED RIGHT;  Surgeon: Leandrew Koyanagi, MD;  Location: [facility];  Service: Ophthalmology;  Laterality: Right;  . COLON SURGERY     12 in. removed due to polyps  . LEFT HEART CATH AND CORONARY ANGIOGRAPHY N/A 05/20/2018   Procedure: LEFT HEART CATH AND CORONARY ANGIOGRAPHY;  Surgeon: Corey Skains, MD;  Location: [facility];  Service: Cardiovascular;  Laterality: N/A;  . TUBAL LIGATION      Social History   Socioeconomic History  . Marital status: Widowed    Spouse name: Not on file  . Number of children: 5  . Years of education: some college  . Highest education level: 12th grade  Occupational History  . Occupation: Retired  Scientific laboratory technician  . Financial resource strain: Not hard at  all  . Food insecurity    Worry: Never true    Inability: Never true  . Transportation needs    Medical: No    Non-medical: No  Tobacco Use  . Smoking status: Former Smoker    Packs/day: 1.00    Years: 60.00    Pack years: 60.00    Types: Cigarettes    Quit date: 12/2017    Years since quitting: 1.6  . Smokeless tobacco: Never Used  . Tobacco comment: smoking cessation materials not required  Substance and Sexual Activity  . Alcohol use: Not Currently    Alcohol/week: 0.0 standard drinks  . Drug use: No  . Sexual activity: Yes  Lifestyle  . Physical activity    Days per week: 0 days    Minutes per session: 0 min  . Stress: Rather much  Relationships  . Social Herbalist on phone: Patient refused    Gets together: Patient refused    Attends religious service: Patient refused    Active member of club or organization: Patient refused    Attends meetings of clubs or organizations: Patient refused    Relationship status: Widowed  . Intimate partner violence    Fear of current or ex partner: No    Emotionally abused: No    Physically abused: No    Forced sexual activity: No  Other Topics Concern  . Not on file  Social History Narrative  . Not on file    Family History  Problem Relation Age of Onset  . Breast cancer Daughter 43  . Breast cancer Daughter 7  . Cirrhosis Mother   . Lung cancer Father      Current Outpatient Medications:  .  atorvastatin (LIPITOR) 80 MG tablet, Take 80 mg by mouth daily., Disp: , Rfl:  .  buPROPion (WELLBUTRIN) 100 MG tablet, Take 1 tablet (100 mg total) by mouth 2 (two) times daily., Disp: 180 tablet, Rfl: 1 .  gabapentin (NEURONTIN) 300 MG capsule, Take 300 mg by mouth 2 (two) times daily. neurology, Disp: , Rfl:  .  metoprolol succinate (TOPROL-XL) 25 MG 24 hr tablet, Take 25 mg by mouth daily., Disp: , Rfl:  .  nitroGLYCERIN (NITROSTAT) 0.4 MG SL tablet, Place 0.4 mg under the tongue every 5 (five) minutes as needed for  chest pain., Disp: , Rfl:  .  pantoprazole (PROTONIX) 40 MG tablet, Take 1 tablet (40 mg total) by mouth 2 (two) times daily., Disp: 60 tablet, Rfl: 5 .  saccharomyces boulardii (FLORASTOR) 250 MG capsule, Take 250 mg by mouth daily. , Disp: , Rfl:  .  sacubitril-valsartan (ENTRESTO) 97-103 MG, Take 0.5 tablets by mouth 2 (two) times daily. (Patient taking differently: Take 0.5 tablets by mouth 2 (two) times daily. Dr Domingo Cocking), Disp: 60 tablet, Rfl: 0 .  spironolactone (ALDACTONE) 25 MG tablet, Take 1 tablet by mouth daily., Disp: , Rfl:  .  torsemide (DEMADEX) 10 MG tablet, Take 1 tablet by mouth daily., Disp: , Rfl:  .  warfarin (COUMADIN) 1 MG tablet, , Disp: , Rfl:   Physical exam: There were no vitals filed for this visit. Limited d/t telephone visit  CMP Latest Ref Rng & Units 04/17/2019  Glucose 70 - 99 mg/dL 108(H)  BUN 8 - 23 mg/dL 20  Creatinine 0.44 - 1.00 mg/dL 1.12(H)  Sodium 135 - 145 mmol/L 140  Potassium 3.5 - 5.1 mmol/L 4.3  Chloride 98 - 111 mmol/L 109  CO2 22 - 32 mmol/L 22  Calcium 8.9 - 10.3 mg/dL 8.7(L)  Total Protein 6.5 - 8.1 g/dL -  Total Bilirubin 0.3 - 1.2 mg/dL -  Alkaline Phos 38 - 126 U/L -  AST 15 - 41 U/L -  ALT 14 - 54 U/L -   CBC Latest Ref Rng & Units 04/17/2019  WBC 4.0 - 10.5 K/uL 6.3  Hemoglobin 12.0 - 15.0 g/dL 13.5  Hematocrit 36.0 - 46.0 % 42.2  Platelets 150 - 400 K/uL 281    No images are attached to the encounter.  Ct Chest Wo Contrast  Result Date: 06/30/2019 CLINICAL DATA:  History of bilateral breast cancer in 2014. Follow-up lung nodules. EXAM: CT CHEST WITHOUT CONTRAST TECHNIQUE: Multidetector CT imaging of the chest was performed following the standard protocol without IV contrast. COMPARISON:  CT scan 05/19/2018 FINDINGS: Cardiovascular: The heart is normal in size. No pericardial effusion. Stable advanced atherosclerotic calcifications involving the thoracic aorta but no focal aneurysm. Stable three-vessel coronary artery  calcifications. Mediastinum/Nodes: Scattered mediastinal and hilar lymph nodes are stable to smaller when compared to the prior examination. 11 mm right paratracheal node on image number 50 previously measured 16 mm. 10.5 mm precarinal lymph node on image number 57 previously measured 17 mm. 7 mm prevascular lymph node on image number 57 previously measured 10 mm. The esophagus is grossly normal. Lungs/Pleura: Stable biapical pleural and parenchymal scarring with calcification. Stable radiation changes involving the anterior aspect of the left lung. Stable emphysematous changes and areas of pulmonary scarring. Stable scattered pulmonary nodules. 6.5 mm right middle lobe nodule on image number 89 is stable. 4 mm superior segment left lower lobe nodule on image number 45 is stable. Stable elongated/linear nodular density in the right middle lobe laterally consistent with scarring changes. Stable bronchiectasis and scarring changes in the lingula. No new or progressive findings. Upper Abdomen: No significant upper abdominal findings. Stable lateral splenic calcification, likely calcified subcapsular splenic hematoma. Stable 13.5 mm calcified gallstone. The small metallic clip is noted in the stomach. Atherosclerotic calcifications involving the aorta. Musculoskeletal: No significant bony findings. IMPRESSION: 1. Stable emphysematous changes and biapical pleural and parenchymal scarring with calcification. 2. Stable radiation changes involving the anterior aspect of the left lung. 3. Stable scattered pulmonary nodules and right middle lobe and lingular scarring changes. 4. Interval decrease in size of the mediastinal lymph nodes. 5. Stable advanced vascular calcifications. Aortic Atherosclerosis (ICD10-I70.0) and Emphysema (ICD10-J43.9). Electronically Signed   By: Marijo Sanes M.D.   On: 06/30/2019 14:11   Assessment and plan- Patient is a 85 y.o. female who presents to pulmonary nodule clinic for follow-up of  incidental  lung nodules.  A telephone visit was conducted to review most recent CT scan results.    CT chest without contrast from today revealed stable radiation changes involving the anterior aspect of the left lung and stable scattered pulmonary nodules and right middle lobe and lingular scarring changes.  Interval decrease in size of mediastinal lymph nodes.  Scattered pulmonary nodules noted 6.5 mm right middle lobe nodule, 4 mm superior segment left lower lobe nodule and nodular density in right middle lobe.  Given her smoking history and size of lung nodule (6.5 mm) I would recommend a 11-month follow-up for a noncontrasted CT.   Calculating malignancy probability of a pulmonary nodule: Risk factors include: 1.  Age. 2.  Cancer history. 3.  Diameter of pulmonary nodule and mm 4.  Location 5.  Smoking history 6.  Spiculation present   Based on risk factors, this patient is High risk for the development of lung cancer.   I recommend 1 additional follow-up in our lung nodule clinic given her high risk for the development of lung cancer.  Patient and son in agreement with plan.  If stable at that time, would defer to PCP Dr. Ronnald Ramp for further imaging.     During our visit, we discussed pulmonary nodules are a common incidental finding and are often how lung cancer is discovered.  Lung cancer survival is directly related to the stage at diagnosis.  We discussed that nodules can vary in presentation from solitary pulmonary nodules to masses, 2 groundglass opacities and multiple nodules.  Pulmonary nodules in the majority of cases are benign but the probability of these becoming malignant cannot be undermined.  Early identification of malignant nodules could lead to early diagnosis and increased survival.   We discussed the probability of pulmonary nodules becoming malignant increase with age, pack years of tobacco use, size/characteristics of the nodule and location; with upper lobe involvement  being most worrisome.   We discussed the goal of our clinic is to thoroughly evaluate each nodule, developed a comprehensive, individualized plan of care utilizing the most advanced technology and significantly reduce the time from detection to treatment.  A dedicated pulmonary nodule clinic has proven to indeed expedite the detection and treatment of lung cancer.   Patient education in fact sheet provided along with most recent CT scans.  Disposition: RTC in 12 months for repeat imaging Non-contrast CT scan.  Orders placed.   Visit Diagnosis 1. Lung nodule     Patient expressed understanding and was in agreement with this plan. She also understands that She can call clinic at any time with any questions, concerns, or complaints.   Greater than 50% was spent in counseling and coordination of care with this patient including but not limited to discussion of the relevant topics above (See A&P) including, but not limited to diagnosis and management of acute and chronic medical conditions.   Thank you for allowing me to participate in the care of this very pleasant patient.    Jacquelin Hawking, NP [place] [facility] Cell - [phone] Pager- [phone] 07/20/2019 3:31 PM

## 2019-07-29 ENCOUNTER — Telehealth: Payer: Self-pay

## 2019-07-29 NOTE — Telephone Encounter (Signed)
Pt's son called stating that "mom has gone crazy." She is "calling the police and reporting that I have left her. She has gotten [place] throwed out of places." Son was advised to take her to [facility] ER for evaluation, since that is where her specialist are located. Also, due to change in status, she may need to have a CT scan done. Son voiced understanding

## 2019-08-05 ENCOUNTER — Other Ambulatory Visit: Payer: Self-pay

## 2019-08-12 ENCOUNTER — Telehealth: Payer: Self-pay | Admitting: *Deleted

## 2019-08-12 NOTE — Telephone Encounter (Signed)
Pt has been made aware of upcoming appts for follow up CT scan and follow up appt with Faythe Casa, NP in the Lung Nodule Clinic. Pt verbalized understanding. Nothing further needed at this time. Appts mailed per pt request.

## 2019-08-31 DIAGNOSIS — I252 Old myocardial infarction: Secondary | ICD-10-CM | POA: Diagnosis not present

## 2019-08-31 DIAGNOSIS — I447 Left bundle-branch block, unspecified: Secondary | ICD-10-CM | POA: Diagnosis not present

## 2019-08-31 DIAGNOSIS — Z8673 Personal history of transient ischemic attack (TIA), and cerebral infarction without residual deficits: Secondary | ICD-10-CM | POA: Diagnosis not present

## 2019-08-31 DIAGNOSIS — I2510 Atherosclerotic heart disease of native coronary artery without angina pectoris: Secondary | ICD-10-CM | POA: Diagnosis not present

## 2019-08-31 DIAGNOSIS — I5042 Chronic combined systolic (congestive) and diastolic (congestive) heart failure: Secondary | ICD-10-CM | POA: Diagnosis not present

## 2019-08-31 DIAGNOSIS — I255 Ischemic cardiomyopathy: Secondary | ICD-10-CM | POA: Diagnosis not present

## 2019-08-31 DIAGNOSIS — Z7901 Long term (current) use of anticoagulants: Secondary | ICD-10-CM | POA: Diagnosis not present

## 2019-08-31 DIAGNOSIS — I110 Hypertensive heart disease with heart failure: Secondary | ICD-10-CM | POA: Diagnosis not present

## 2019-08-31 DIAGNOSIS — K219 Gastro-esophageal reflux disease without esophagitis: Secondary | ICD-10-CM | POA: Diagnosis not present

## 2019-08-31 DIAGNOSIS — R42 Dizziness and giddiness: Secondary | ICD-10-CM | POA: Diagnosis not present

## 2019-08-31 DIAGNOSIS — Z79899 Other long term (current) drug therapy: Secondary | ICD-10-CM | POA: Diagnosis not present

## 2019-08-31 DIAGNOSIS — I480 Paroxysmal atrial fibrillation: Secondary | ICD-10-CM | POA: Diagnosis not present

## 2019-08-31 DIAGNOSIS — Z86718 Personal history of other venous thrombosis and embolism: Secondary | ICD-10-CM | POA: Diagnosis not present

## 2019-08-31 DIAGNOSIS — I429 Cardiomyopathy, unspecified: Secondary | ICD-10-CM | POA: Diagnosis not present

## 2019-08-31 DIAGNOSIS — I10 Essential (primary) hypertension: Secondary | ICD-10-CM | POA: Diagnosis not present

## 2019-08-31 DIAGNOSIS — E7800 Pure hypercholesterolemia, unspecified: Secondary | ICD-10-CM | POA: Diagnosis not present

## 2019-08-31 DIAGNOSIS — Z87891 Personal history of nicotine dependence: Secondary | ICD-10-CM | POA: Diagnosis not present

## 2019-09-08 ENCOUNTER — Other Ambulatory Visit: Payer: Self-pay | Admitting: Family Medicine

## 2019-09-08 DIAGNOSIS — Z1231 Encounter for screening mammogram for malignant neoplasm of breast: Secondary | ICD-10-CM

## 2019-09-25 NOTE — Progress Notes (Signed)
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  INTERVAL HISTORY: Patient returns to clinic today for routine yearly evaluation and discussion of her mammogram results.  She seems mildly confused today and is unclear of the reason of her visit, but offers no complaints.  She is accompanied by her son. She does not complain of weakness or fatigue today.  She has no neurologic complaints.  She denies any recent fevers or illnesses.  She has a good appetite and denies weight loss.  She denies chest pain, shortness of breath, cough, or hemoptysis.  She denies any nausea, vomiting, constipation, or diarrhea.  She has no urinary complaints.  Patient offers no further specific complaints today.  REVIEW OF SYSTEMS:   Review of Systems  Constitutional: Negative.  Negative for chills, fever, malaise/fatigue and weight loss.  HENT: Negative for congestion.   Respiratory: Negative.  Negative for cough and shortness of breath.   Cardiovascular: Negative.  Negative for chest pain and leg swelling.  Gastrointestinal: Negative.  Negative for abdominal pain, constipation, diarrhea, heartburn, nausea and vomiting.  Genitourinary: Negative.  Negative for frequency, hematuria and urgency.  Musculoskeletal: Negative.  Negative for falls and neck pain.  Neurological: Negative.  Negative for sensory change, focal weakness, weakness and headaches.  Psychiatric/Behavioral: Positive for memory loss. The patient is not nervous/anxious and does not have  insomnia.     As per HPI. Otherwise, a complete review of systems is negative.  PAST MEDICAL HISTORY: Past Medical History:  Diagnosis Date  . Anxiety   . Anxiety   . Arthritis   . Breast cancer ([place]) 2014   bilateral invasive mammary carcinoma. with 36 rad tx.   . Breast cancer ([place])   . Cancer ([place])    breast  . Depression   . Depression   . Hard of hearing   . Hyperlipemia   . Hypertension   . IBS (irritable bowel syndrome)   . Memory loss   . Osteoporosis   . Personal history of radiation therapy   . Wears dentures    full upper, partial lower    PAST SURGICAL HISTORY: Past Surgical History:  Procedure Laterality Date  . APPENDECTOMY    . BREAST BIOPSY Bilateral 2014  . BREAST LUMPECTOMY Bilateral 05/2013  . BREAST SURGERY Bilateral   . CATARACT EXTRACTION W/PHACO Right 12/11/2017   Procedure: CATARACT EXTRACTION PHACO AND INTRAOCULAR LENS PLACEMENT ([place]) COMPLICATED RIGHT;  Surgeon: Leandrew Koyanagi, MD;  Location: [facility];  Service: Ophthalmology;  Laterality: Right;  . COLON SURGERY     12 in. removed due to polyps  . LEFT HEART CATH AND CORONARY ANGIOGRAPHY N/A 05/20/2018   Procedure: LEFT HEART CATH AND CORONARY ANGIOGRAPHY;  Surgeon: Corey Skains, MD;  Location: [facility];  Service: Cardiovascular;  Laterality: N/A;  . TUBAL LIGATION      FAMILY HISTORY Family History  Problem Relation Age of Onset  . Breast cancer Daughter 82  . Breast cancer Daughter 56  . Cirrhosis Mother   . Lung cancer Father  ADVANCED DIRECTIVES:    HEALTH MAINTENANCE: Social History   Tobacco Use  . Smoking status: Former Smoker    Packs/day: 1.00    Years: 60.00    Pack years: 60.00    Types: Cigarettes    Quit date: 12/2017    Years since quitting: 1.8  . Smokeless tobacco: Never Used  . Tobacco comment: smoking cessation materials not required  Substance Use Topics  . Alcohol use: Not Currently    Alcohol/week: 0.0 standard  drinks  . Drug use: No     Colonoscopy:  PAP:  Bone density:  Lipid panel:  Allergies  Allergen Reactions  . Iodine Nausea Only    Betadine okay   . Erythromycin Rash  . Penicillins Rash    Has patient had a PCN reaction causing immediate rash, facial/tongue/throat swelling, SOB or lightheadedness with hypotension: No Has patient had a PCN reaction causing severe rash involving mucus membranes or skin necrosis: No Has patient had a PCN reaction that required hospitalization: No Has patient had a PCN reaction occurring within the last 10 years: No If all of the above answers are "NO", then may proceed with Cephalosporin use.   . Shellfish Allergy Rash    Current Outpatient Medications  Medication Sig Dispense Refill  . atorvastatin (LIPITOR) 80 MG tablet Take 80 mg by mouth daily.    Marland Kitchen buPROPion (WELLBUTRIN) 100 MG tablet Take 1 tablet (100 mg total) by mouth 2 (two) times daily. 180 tablet 1  . gabapentin (NEURONTIN) 300 MG capsule Take 300 mg by mouth 2 (two) times daily. neurology    . metoprolol succinate (TOPROL-XL) 25 MG 24 hr tablet Take 25 mg by mouth daily.    . nitroGLYCERIN (NITROSTAT) 0.4 MG SL tablet Place 0.4 mg under the tongue every 5 (five) minutes as needed for chest pain.    Marland Kitchen omeprazole (PRILOSEC) 40 MG capsule Take 40 mg by mouth 2 (two) times daily.    . pantoprazole (PROTONIX) 40 MG tablet Take 1 tablet (40 mg total) by mouth 2 (two) times daily. 60 tablet 5  . saccharomyces boulardii (FLORASTOR) 250 MG capsule Take 250 mg by mouth daily.     . sacubitril-valsartan (ENTRESTO) 97-103 MG Take 0.5 tablets by mouth 2 (two) times daily. (Patient taking differently: Take 0.5 tablets by mouth 2 (two) times daily. Dr Domingo Cocking) 60 tablet 0  . spironolactone (ALDACTONE) 25 MG tablet Take 1 tablet by mouth daily.    Marland Kitchen torsemide (DEMADEX) 10 MG tablet Take 1 tablet by mouth daily.    Marland Kitchen warfarin (COUMADIN) 1 MG tablet      No current facility-administered medications  for this visit.    OBJECTIVE: Vitals:   10/02/19 1020  BP: (!) 147/58  Pulse: 65  Resp: 18  Temp: 98.6 F (37 C)  SpO2: 99%     Body mass index is 25.14 kg/m.    ECOG FS:0 - Asymptomatic  General: Well-developed, well-nourished, no acute distress. Eyes: Pink conjunctiva, anicteric sclera. HEENT: Normocephalic, moist mucous membranes. Breast: Exam deferred today. Lungs: No audible wheezing or coughing. Heart: Regular rate and rhythm. Abdomen: Soft, nontender, no obvious distention. Musculoskeletal: No edema, cyanosis, or clubbing. Neuro: Alert, answering all questions appropriately. Cranial nerves grossly intact. Skin: No rashes or petechiae noted. Psych: Normal affect.   LAB RESULTS:  Lab Results  Component Value Date   NA 140 04/17/2019   K 4.3 04/17/2019   CL 109 04/17/2019   CO2 22 04/17/2019   GLUCOSE 108 (H)  04/17/2019   BUN 20 04/17/2019   CREATININE 1.12 (H) 04/17/2019   CALCIUM 8.7 (L) 04/17/2019   PROT 7.3 12/25/2016   ALBUMIN 4.2 01/08/2019   AST 17 12/25/2016   ALT 9 (L) 12/25/2016   ALKPHOS 63 12/25/2016   BILITOT 0.6 12/25/2016   GFRNONAA 45 (L) 04/17/2019   GFRAA 53 (L) 04/17/2019    Lab Results  Component Value Date   WBC 6.3 04/17/2019   NEUTROABS 4.5 01/08/2019   HGB 13.5 04/17/2019   HCT 42.2 04/17/2019   MCV 90.8 04/17/2019   PLT 281 04/17/2019     STUDIES: MM 3D SCREEN BREAST BILATERAL  Result Date: 09/28/2019 CLINICAL DATA:  Screening. EXAM: DIGITAL SCREENING BILATERAL MAMMOGRAM WITH TOMO AND CAD COMPARISON:  Previous exam(s). ACR Breast Density Category b: There are scattered areas of fibroglandular density. FINDINGS: There are no findings suspicious for malignancy. Images were processed with CAD. IMPRESSION: No mammographic evidence of malignancy. A result letter of this screening mammogram will be mailed directly to the patient. RECOMMENDATION: Screening mammogram in one year. (Code:SM-B-01Y) BI-RADS CATEGORY  1: Negative.  Electronically Signed   By: Lillia Mountain M.D.   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  PLAN:    1. Bilateral breast cancer:  Previously after lengthy discussion with patient and her family about her intermediate Oncotype score, she declined adjuvant chemotherapy.  Unclear of patient's compliance with letrozole, but she completed 5 years of treatment in January 2020.  Patient previously declined extended aromatase inhibitor treatment.  Her most recent mammogram on September 12, 2019 was reported as BI-RADS 2.  Repeat in January 2022.  Return to clinic in 1 year for routine evaluation.   2.  Osteopenia: Patient's most recent bone mineral density on September 22, 2018 reported T score of -3.2.  Patient has now completed letrozole.  Continue monitoring and treatment per primary care.  Patient was seen and evaluated independently and I agree with the assessment and plan as dictated above.  Lloyd Huger, MD 10/02/19 6:52 PM

## 2019-09-28 ENCOUNTER — Ambulatory Visit
Admission: RE | Admit: 2019-09-28 | Discharge: 2019-09-28 | Disposition: A | Discharge status: Home / Self Care | Payer: PPO | Source: Ambulatory Visit | Attending: Family Medicine | Admitting: Family Medicine

## 2019-09-28 ENCOUNTER — Other Ambulatory Visit: Payer: Self-pay

## 2019-09-28 DIAGNOSIS — Z1231 Encounter for screening mammogram for malignant neoplasm of breast: Secondary | ICD-10-CM | POA: Diagnosis not present

## 2019-09-28 HISTORY — DX: Personal history of irradiation: Z92.3

## 2019-10-01 ENCOUNTER — Other Ambulatory Visit: Payer: Self-pay

## 2019-10-01 NOTE — Progress Notes (Signed)
Patient pre screened for office appointment, no questions or concerns today. Patient reminded of upcoming appointment time and date. 

## 2019-10-02 ENCOUNTER — Inpatient Hospital Stay: Payer: PPO | Attending: Oncology | Admitting: Oncology

## 2019-10-02 ENCOUNTER — Other Ambulatory Visit: Payer: Self-pay

## 2019-10-02 VITALS — BP 147/58 | HR 65 | Temp 98.6°F | Resp 18 | Wt 160.5 lb

## 2019-10-02 DIAGNOSIS — M8580 Other specified disorders of bone density and structure, unspecified site: Secondary | ICD-10-CM | POA: Insufficient documentation

## 2019-10-02 DIAGNOSIS — Z79899 Other long term (current) drug therapy: Secondary | ICD-10-CM | POA: Diagnosis not present

## 2019-10-02 DIAGNOSIS — Z170 Estrogen receptor positive status [ER+]: Secondary | ICD-10-CM | POA: Diagnosis not present

## 2019-10-02 DIAGNOSIS — C50911 Malignant neoplasm of unspecified site of right female breast: Secondary | ICD-10-CM | POA: Insufficient documentation

## 2019-10-02 DIAGNOSIS — Z79811 Long term (current) use of aromatase inhibitors: Secondary | ICD-10-CM | POA: Insufficient documentation

## 2019-10-02 DIAGNOSIS — C50912 Malignant neoplasm of unspecified site of left female breast: Secondary | ICD-10-CM | POA: Insufficient documentation

## 2019-10-02 DIAGNOSIS — I10 Essential (primary) hypertension: Secondary | ICD-10-CM | POA: Insufficient documentation

## 2019-10-05 ENCOUNTER — Ambulatory Visit (INDEPENDENT_AMBULATORY_CARE_PROVIDER_SITE_OTHER): Payer: PPO | Admitting: Family Medicine

## 2019-10-05 ENCOUNTER — Encounter: Payer: Self-pay | Admitting: Family Medicine

## 2019-10-05 ENCOUNTER — Other Ambulatory Visit: Payer: Self-pay

## 2019-10-05 VITALS — BP 138/70 | HR 68 | Ht 67.0 in | Wt 161.0 lb

## 2019-10-05 DIAGNOSIS — K047 Periapical abscess without sinus: Secondary | ICD-10-CM

## 2019-10-05 MED ORDER — DOXYCYCLINE HYCLATE 100 MG PO TABS: 100.0000 mg | ORAL_TABLET | Freq: Two times a day (BID) | ORAL | 0 refills | Status: DC

## 2019-10-05 NOTE — Patient Instructions (Signed)
Dental Abscess  A dental abscess is an area of pus in or around a tooth. It comes from an infection. It can cause pain and other symptoms. Treatment will help with symptoms and prevent the infection from spreading. Follow these instructions at home: Medicines  Take over-the-counter and prescription medicines only as told by your dentist.  If you were prescribed an antibiotic medicine, take it as told by your dentist. Do not stop taking it even if you start to feel better.  If you were prescribed a gel that has numbing medicine in it, use it exactly as told.  Do not drive or use heavy machinery (like a lawn mower) while taking prescription pain medicine. General instructions  Rinse out your mouth often with salt water. ? To make salt water, dissolve -1 tsp of salt in 1 cup of warm water.  Eat a soft diet while your mouth is healing.  Drink enough fluid to keep your urine pale yellow.  Do not apply heat to the outside of your mouth.  Do not use any products that contain nicotine or tobacco. These include cigarettes and e-cigarettes. If you need help quitting, ask your doctor.  Keep all follow-up visits as told by your dentist. This is important. Prevent an abscess  Brush your teeth every morning and every night. Use fluoride toothpaste.  Floss your teeth each day.  Get dental cleanings as often as told by your dentist.  Think about getting dental sealant put on teeth that have deep holes (decay).  Drink water that has fluoride in it. ? Most tap water has fluoride. ? Check the label on bottled water to see if it has fluoride in it.  Drink water instead of sugary drinks.  Eat healthy meals and snacks.  Wear a mouth guard or face shield when you play sports. Contact a doctor if:  Your pain is worse, and medicine does not help. Get help right away if:  You have a fever or chills.  Your symptoms suddenly get worse.  You have a very bad headache.  You have problems  breathing or swallowing.  You have trouble opening your mouth.  You have swelling in your neck or close to your eye. Summary  A dental abscess is an area of pus in or around a tooth. It is caused by an infection.  Treatment will help with symptoms and prevent the infection from spreading.  Take over-the-counter and prescription medicines only as told by your dentist.  To prevent an abscess, take good care of your teeth. Brush your teeth every morning and night. Use floss every day.  Get dental cleanings as often as told by your dentist. This information is not intended to replace advice given to you by your health care provider. Make sure you discuss any questions you have with your health care provider. Document Revised: 12/17/2018 Document Reviewed: 04/29/2017 Elsevier Patient Education  2020 Elsevier Inc.  

## 2019-10-05 NOTE — Progress Notes (Signed)
Date:  10/05/2019   Name:  Veronica Ellis   DOB:  1934/04/16   MRN:  [id number]   Chief Complaint: Abscess (tooth on bottom right side- has appt with dentist coming up after clearance from [facility])  Abscess This is a new problem. The current episode started in the past 7 days. The problem occurs daily. The problem has been gradually worsening. Pertinent negatives include no abdominal pain, arthralgias, chest pain, chills, congestion, coughing, fatigue, fever, headaches, myalgias, nausea, numbness, rash, sore throat, swollen glands, vomiting or weakness. Nothing aggravates the symptoms.    Lab Results  Component Value Date   CREATININE 1.12 (H) 04/17/2019   BUN 20 04/17/2019   NA 140 04/17/2019   K 4.3 04/17/2019   CL 109 04/17/2019   CO2 22 04/17/2019   Lab Results  Component Value Date   CHOL 189 05/20/2018   HDL 52 05/20/2018   LDLCALC 113 (H) 05/20/2018   TRIG 119 05/20/2018   CHOLHDL 3.6 05/20/2018   Lab Results  Component Value Date   TSH 5.789 (H) 05/19/2018   Lab Results  Component Value Date   HGBA1C 5.4 05/19/2018     Review of Systems  Constitutional: Negative.  Negative for chills, fatigue, fever and unexpected weight change.  HENT: Negative for congestion, ear discharge, ear pain, rhinorrhea, sinus pressure, sneezing and sore throat.   Eyes: Negative for photophobia, pain, discharge, redness and itching.  Respiratory: Negative for cough, shortness of breath, wheezing and stridor.   Cardiovascular: Negative for chest pain, palpitations and leg swelling.  Gastrointestinal: Negative for abdominal pain, blood in stool, constipation, diarrhea, nausea and vomiting.  Endocrine: Negative for cold intolerance, heat intolerance, polydipsia, polyphagia and polyuria.  Genitourinary: Negative for dysuria, flank pain, frequency, hematuria, menstrual problem, pelvic pain, urgency, vaginal bleeding and vaginal discharge.  Musculoskeletal: Negative for arthralgias,  back pain and myalgias.  Skin: Negative for rash.  Allergic/Immunologic: Negative for environmental allergies and food allergies.  Neurological: Negative for dizziness, weakness, light-headedness, numbness and headaches.  Hematological: Negative for adenopathy. Does not bruise/bleed easily.  Psychiatric/Behavioral: Negative for dysphoric mood. The patient is not nervous/anxious.     Patient Active Problem List   Diagnosis Date Noted  . History of IBS 09/26/2018  . Long term (current) use of anticoagulants 07/17/2018  . 3-vessel CAD 07/08/2018  . Chronic combined systolic and diastolic CHF (congestive heart failure) ([place]) 07/08/2018  . Ischemic cardiomyopathy 07/08/2018  . AKI (acute kidney injury) ([place]) 06/06/2018  . Hyperkalemia 06/05/2018  . Atrial fibrillation with RVR ([place]) 05/21/2018  . Microcytic anemia 05/21/2018  . NSTEMI (non-ST elevated myocardial infarction) ([place]) 05/19/2018  . Acute on chronic systolic heart failure ([place]) 07/30/2017  . LBBB (left bundle branch block) 07/10/2017  . Lumbar radiculopathy 05/16/2017  . Hx of low back pain 05/14/2017  . Ataxia 12/31/2016  . Memory loss 12/31/2016  . Recurrent major depressive disorder, in partial remission ([place]) 12/31/2016  . Bipolar 1 disorder, mixed, moderate ([place]) 12/25/2016  . Chronic bilateral low back pain with bilateral sciatica 10/18/2016  . Bilateral breast cancer ([place]) 02/20/2016  . 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 Cerebrovascular disease 02/16/2015  . Anxiety 02/16/2015  . Breast lump 02/16/2015  . 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 Routine general medical examination at a health care facility 02/16/2015  . Recurrent major depressive episodes ([place]) 02/16/2015  . Below normal amount of sodium in the blood 02/16/2015  . Pre-operative cardiovascular examination  02/16/2015    Allergies  Allergen Reactions  . Iodine Nausea Only     Betadine okay   . Erythromycin Rash  . Penicillins Rash    Has patient had a PCN reaction causing immediate rash, facial/tongue/throat swelling, SOB or lightheadedness with hypotension: No Has patient had a PCN reaction causing severe rash involving mucus membranes or skin necrosis: No Has patient had a PCN reaction that required hospitalization: No Has patient had a PCN reaction occurring within the last 10 years: No If all of the above answers are "NO", then may proceed with Cephalosporin use.   Marland Kitchen Shellfish Allergy Rash    Past Surgical History:  Procedure Laterality Date  . APPENDECTOMY    . BREAST BIOPSY Bilateral 2014  . BREAST LUMPECTOMY Bilateral 05/2013  . BREAST SURGERY Bilateral   . CATARACT EXTRACTION W/PHACO Right 12/11/2017   Procedure: CATARACT EXTRACTION PHACO AND INTRAOCULAR LENS PLACEMENT ([place]) COMPLICATED RIGHT;  Surgeon: Leandrew Koyanagi, MD;  Location: [facility];  Service: Ophthalmology;  Laterality: Right;  . COLON SURGERY     12 in. removed due to polyps  . LEFT HEART CATH AND CORONARY ANGIOGRAPHY N/A 05/20/2018   Procedure: LEFT HEART CATH AND CORONARY ANGIOGRAPHY;  Surgeon: Corey Skains, MD;  Location: [facility];  Service: Cardiovascular;  Laterality: N/A;  . TUBAL LIGATION      Social History   Tobacco Use  . Smoking status: Former Smoker    Packs/day: 1.00    Years: 60.00    Pack years: 60.00    Types: Cigarettes    Quit date: 12/2017    Years since quitting: 1.8  . Smokeless tobacco: Never Used  . Tobacco comment: smoking cessation materials not required  Substance Use Topics  . Alcohol use: Not Currently    Alcohol/week: 0.0 standard drinks  . Drug use: No     Medication list has been reviewed and updated.  Current Meds  Medication Sig  . atorvastatin (LIPITOR) 80 MG tablet Take 80 mg by mouth daily.  Marland Kitchen buPROPion (WELLBUTRIN) 100 MG tablet Take 1 tablet (100 mg total) by mouth 2 (two) times daily.  Marland Kitchen  gabapentin (NEURONTIN) 300 MG capsule Take 300 mg by mouth 2 (two) times daily. neurology  . metoprolol succinate (TOPROL-XL) 25 MG 24 hr tablet Take 25 mg by mouth daily.  . nitroGLYCERIN (NITROSTAT) 0.4 MG SL tablet Place 0.4 mg under the tongue every 5 (five) minutes as needed for chest pain.  Marland Kitchen omeprazole (PRILOSEC) 40 MG capsule Take 40 mg by mouth 2 (two) times daily.  . pantoprazole (PROTONIX) 40 MG tablet Take 1 tablet (40 mg total) by mouth 2 (two) times daily.  Marland Kitchen saccharomyces boulardii (FLORASTOR) 250 MG capsule Take 250 mg by mouth daily.   . sacubitril-valsartan (ENTRESTO) 97-103 MG Take 0.5 tablets by mouth 2 (two) times daily. (Patient taking differently: Take 0.5 tablets by mouth 2 (two) times daily. Dr Domingo Cocking)  . spironolactone (ALDACTONE) 25 MG tablet Take 1 tablet by mouth daily.  Marland Kitchen torsemide (DEMADEX) 10 MG tablet Take 1 tablet by mouth daily.  Marland Kitchen warfarin (COUMADIN) 1 MG tablet     PHQ 2/9 Scores 10/05/2019 05/11/2019 03/03/2018 12/26/2017  PHQ - 2 Score 0 0 4 1  PHQ- 9 Score 0 0 11 2    BP Readings from Last 3 Encounters:  10/05/19 138/70  10/02/19 (!) 147/58  05/11/19 120/60    Physical Exam Vitals and nursing note reviewed.  Constitutional:  General: She is not in acute distress.    Appearance: She is not diaphoretic.  HENT:     Head: Normocephalic and atraumatic.     Right Ear: Tympanic membrane, ear canal and external ear normal.     Left Ear: Tympanic membrane, ear canal and external ear normal.     Nose: Nose normal. No congestion.     Mouth/Throat:     Mouth: Mucous membranes are moist.     Dentition: Abnormal dentition. Does not have dentures. Dental tenderness, gingival swelling, dental caries and dental abscesses present. No gum lesions.     Pharynx: Oropharynx is clear. Uvula midline.     Tonsils: No tonsillar exudate or tonsillar abscesses.  Eyes:     General:        Right eye: No discharge.        Left eye: No discharge.      Conjunctiva/sclera: Conjunctivae normal.     Pupils: Pupils are equal, round, and reactive to light.  Neck:     Thyroid: No thyromegaly.     Vascular: No JVD.  Cardiovascular:     Rate and Rhythm: Normal rate and regular rhythm.     Heart sounds: Normal heart sounds. No murmur. No friction rub. No gallop.   Pulmonary:     Effort: Pulmonary effort is normal.     Breath sounds: Wheezing present.  Abdominal:     General: Bowel sounds are normal.     Palpations: Abdomen is soft. There is no mass.     Tenderness: There is no abdominal tenderness. There is no guarding.  Musculoskeletal:        General: Normal range of motion.     Cervical back: Normal range of motion and neck supple.  Lymphadenopathy:     Cervical: No cervical adenopathy.  Skin:    General: Skin is warm and dry.  Neurological:     Mental Status: She is alert.     Deep Tendon Reflexes: Reflexes are normal and symmetric.     Wt Readings from Last 3 Encounters:  10/05/19 161 lb (73 kg)  10/02/19 160 lb 8 oz (72.8 kg)  05/11/19 154 lb (69.9 kg)    BP 138/70   Pulse 68   Ht 5\' 7"  (1.702 m)   Wt 161 lb (73 kg)   BMI 25.22 kg/m   Assessment and Plan: 1. Dental abscess New onset.  Uncontrolled.  Stable.  Patient is unable to take penicillin and cephalosporins.  Will initiate doxycycline in the event that she is coming off Coumadin for short period time in order to extracted.  Patient has been cautioned about bleeding and concerns about being on Coumadin and she will be careful with her anticoagulation medication. - doxycycline (VIBRA-TABS) 100 MG tablet; Take 1 tablet (100 mg total) by mouth 2 (two) times daily.  Dispense: 14 tablet; Refill: 0

## 2019-10-29 ENCOUNTER — Ambulatory Visit: Payer: PPO | Admitting: Family Medicine

## 2019-10-30 ENCOUNTER — Ambulatory Visit: Payer: PPO | Admitting: Family Medicine

## 2019-10-31 ENCOUNTER — Ambulatory Visit: Payer: PPO

## 2019-11-13 ENCOUNTER — Encounter: Payer: Self-pay | Admitting: Family Medicine

## 2019-11-13 ENCOUNTER — Ambulatory Visit (INDEPENDENT_AMBULATORY_CARE_PROVIDER_SITE_OTHER): Payer: PPO | Admitting: Family Medicine

## 2019-11-13 ENCOUNTER — Other Ambulatory Visit: Payer: Self-pay

## 2019-11-13 DIAGNOSIS — F3341 Major depressive disorder, recurrent, in partial remission: Secondary | ICD-10-CM

## 2019-11-13 DIAGNOSIS — K922 Gastrointestinal hemorrhage, unspecified: Secondary | ICD-10-CM

## 2019-11-13 DIAGNOSIS — F17210 Nicotine dependence, cigarettes, uncomplicated: Secondary | ICD-10-CM | POA: Diagnosis not present

## 2019-11-13 DIAGNOSIS — Z8719 Personal history of other diseases of the digestive system: Secondary | ICD-10-CM

## 2019-11-13 MED ORDER — PANTOPRAZOLE SODIUM 40 MG PO TBEC: 40.0000 mg | DELAYED_RELEASE_TABLET | Freq: Two times a day (BID) | ORAL | 1 refills | Status: DC

## 2019-11-13 MED ORDER — BUPROPION HCL 100 MG PO TABS: 100.0000 mg | ORAL_TABLET | Freq: Two times a day (BID) | ORAL | 1 refills | Status: DC

## 2019-11-13 MED ORDER — OMEPRAZOLE 40 MG PO CPDR: 40.0000 mg | DELAYED_RELEASE_CAPSULE | Freq: Two times a day (BID) | ORAL | 1 refills | Status: DC

## 2019-11-13 NOTE — Progress Notes (Addendum)
Date:  11/13/2019   Name:  Veronica Ellis   DOB:  1934-04-04   MRN:  [id number]   Chief Complaint: Gastroesophageal Reflux and Depression  Gastroesophageal Reflux She reports no abdominal pain, no belching, no chest pain, no choking, no coughing, no dysphagia, no early satiety, no globus sensation, no heartburn, no hoarse voice, no nausea, no sore throat, no stridor, no tooth decay, no water brash or no wheezing. This is a chronic problem. The current episode started more than 1 year ago. The problem has been gradually worsening. The symptoms are aggravated by certain foods. Pertinent negatives include no anemia, fatigue, melena, muscle weakness, orthopnea or weight loss. She has tried a PPI for the symptoms. The treatment provided moderate relief.  Depression        This is a chronic problem.  The current episode started more than 1 year ago.   The onset quality is gradual.   The problem occurs intermittently.  The problem has been rapidly improving since onset.  Associated symptoms include no decreased concentration, no fatigue, no helplessness, no hopelessness, does not have insomnia, not irritable, no restlessness, no decreased interest, no appetite change, no body aches, no myalgias, no headaches, no indigestion, not sad and no suicidal ideas.  Past treatments include SSRIs - Selective serotonin reuptake inhibitors.  Compliance with treatment is variable.  Previous treatment provided moderate relief.   Lab Results  Component Value Date   CREATININE 1.12 (H) 04/17/2019   BUN 20 04/17/2019   NA 140 04/17/2019   K 4.3 04/17/2019   CL 109 04/17/2019   CO2 22 04/17/2019   Lab Results  Component Value Date   CHOL 189 05/20/2018   HDL 52 05/20/2018   LDLCALC 113 (H) 05/20/2018   TRIG 119 05/20/2018   CHOLHDL 3.6 05/20/2018   Lab Results  Component Value Date   TSH 5.789 (H) 05/19/2018   Lab Results  Component Value Date   HGBA1C 5.4 05/19/2018     Review of Systems    Constitutional: Negative.  Negative for appetite change, chills, fatigue, fever, unexpected weight change and weight loss.  HENT: Negative for congestion, ear discharge, ear pain, hoarse voice, rhinorrhea, sinus pressure, sneezing and sore throat.   Eyes: Negative for photophobia, pain, discharge, redness and itching.  Respiratory: Negative for cough, choking, shortness of breath, wheezing and stridor.   Cardiovascular: Negative for chest pain.  Gastrointestinal: Negative for abdominal pain, blood in stool, constipation, diarrhea, dysphagia, heartburn, melena, nausea and vomiting.  Endocrine: Negative for cold intolerance, heat intolerance, polydipsia, polyphagia and polyuria.  Genitourinary: Negative for dysuria, flank pain, frequency, hematuria, menstrual problem, pelvic pain, urgency, vaginal bleeding and vaginal discharge.  Musculoskeletal: Negative for arthralgias, back pain, myalgias and muscle weakness.  Skin: Negative for rash.  Allergic/Immunologic: Negative for environmental allergies and food allergies.  Neurological: Negative for dizziness, weakness, light-headedness, numbness and headaches.  Hematological: Negative for adenopathy. Does not bruise/bleed easily.  Psychiatric/Behavioral: Positive for depression. Negative for decreased concentration, dysphoric mood and suicidal ideas. The patient is not nervous/anxious and does not have insomnia.     Patient Active Problem List   Diagnosis Date Noted  . History of IBS 09/26/2018  . Long term (current) use of anticoagulants 07/17/2018  . 3-vessel CAD 07/08/2018  . Chronic combined systolic and diastolic CHF (congestive heart failure) ([place]) 07/08/2018  . Ischemic cardiomyopathy 07/08/2018  . AKI (acute kidney injury) ([place]) 06/06/2018  . Hyperkalemia 06/05/2018  . Atrial fibrillation with RVR ([place]) 05/21/2018  .  Microcytic anemia 05/21/2018  . NSTEMI (non-ST elevated myocardial infarction) ([place]) 05/19/2018  . Acute on chronic  systolic heart failure ([place]) 07/30/2017  . LBBB (left bundle branch block) 07/10/2017  . Lumbar radiculopathy 05/16/2017  . Hx of low back pain 05/14/2017  . Ataxia 12/31/2016  . Memory loss 12/31/2016  . Recurrent major depressive disorder, in partial remission ([place]) 12/31/2016  . Bipolar 1 disorder, mixed, moderate ([place]) 12/25/2016  . Chronic bilateral low back pain with bilateral sciatica 10/18/2016  . Bilateral breast cancer ([place]) 02/20/2016  . 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 Cerebrovascular disease 02/16/2015  . Anxiety 02/16/2015  . Breast lump 02/16/2015  . 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[place]) 02/16/2015  . 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 Routine general medical examination at a health care facility 02/16/2015  . Recurrent major depressive episodes ([place]) 02/16/2015  . Below normal amount of sodium in the blood 02/16/2015  . Pre-operative cardiovascular examination 02/16/2015    Allergies  Allergen Reactions  . Iodine Nausea Only    Betadine okay   . Erythromycin Rash  . Penicillins Rash    Has patient had a PCN reaction causing immediate rash, facial/tongue/throat swelling, SOB or lightheadedness with hypotension: No Has patient had a PCN reaction causing severe rash involving mucus membranes or skin necrosis: No Has patient had a PCN reaction that required hospitalization: No Has patient had a PCN reaction occurring within the last 10 years: No If all of the above answers are "NO", then may proceed with Cephalosporin use.   Marland Kitchen Shellfish Allergy Rash    Past Surgical History:  Procedure Laterality Date  . APPENDECTOMY    . BREAST BIOPSY Bilateral 2014  . BREAST LUMPECTOMY Bilateral 05/2013  . BREAST SURGERY Bilateral   . CATARACT EXTRACTION W/PHACO Right 12/11/2017   Procedure: CATARACT EXTRACTION PHACO AND INTRAOCULAR LENS PLACEMENT ([place]) COMPLICATED RIGHT;  Surgeon: Leandrew Koyanagi, MD;  Location: [facility];  Service:  Ophthalmology;  Laterality: Right;  . COLON SURGERY     12 in. removed due to polyps  . LEFT HEART CATH AND CORONARY ANGIOGRAPHY N/A 05/20/2018   Procedure: LEFT HEART CATH AND CORONARY ANGIOGRAPHY;  Surgeon: Corey Skains, MD;  Location: [facility];  Service: Cardiovascular;  Laterality: N/A;  . TUBAL LIGATION      Social History   Tobacco Use  . Smoking status: Former Smoker    Packs/day: 1.00    Years: 60.00    Pack years: 60.00    Types: Cigarettes    Quit date: 12/2017    Years since quitting: 1.9  . Smokeless tobacco: Never Used  . Tobacco comment: smoking cessation materials not required  Substance Use Topics  . Alcohol use: Not Currently    Alcohol/week: 0.0 standard drinks  . Drug use: No     Medication list has been reviewed and updated.  Current Meds  Medication Sig  . atorvastatin (LIPITOR) 80 MG tablet Take 80 mg by mouth daily.  Marland Kitchen buPROPion (WELLBUTRIN) 100 MG tablet Take 1 tablet (100 mg total) by mouth 2 (two) times daily.  Marland Kitchen gabapentin (NEURONTIN) 300 MG capsule Take 300 mg by mouth 2 (two) times daily. neurology  . metoprolol succinate (TOPROL-XL) 25 MG 24 hr tablet Take 25 mg by mouth daily.  . nitroGLYCERIN (NITROSTAT) 0.4 MG SL tablet Place 0.4 mg under the tongue every 5 (five) minutes as needed for chest pain.  Marland Kitchen omeprazole (PRILOSEC) 40 MG capsule Take 40 mg by mouth 2 (two) times  daily.  . pantoprazole (PROTONIX) 40 MG tablet Take 1 tablet (40 mg total) by mouth 2 (two) times daily.  Marland Kitchen saccharomyces boulardii (FLORASTOR) 250 MG capsule Take 250 mg by mouth daily.   . sacubitril-valsartan (ENTRESTO) 97-103 MG Take 0.5 tablets by mouth 2 (two) times daily. (Patient taking differently: Take 0.5 tablets by mouth 2 (two) times daily. Dr Domingo Cocking)  . spironolactone (ALDACTONE) 25 MG tablet Take 1 tablet by mouth daily.  Marland Kitchen torsemide (DEMADEX) 10 MG tablet Take 1 tablet by mouth daily.  Marland Kitchen warfarin (COUMADIN) 1 MG tablet     PHQ 2/9 Scores  11/13/2019 10/05/2019 05/11/2019 03/03/2018  PHQ - 2 Score 0 0 0 4  PHQ- 9 Score 5 0 0 11    BP Readings from Last 3 Encounters:  11/13/19 110/60  10/05/19 138/70  10/02/19 (!) 147/58    Physical Exam Vitals and nursing note reviewed.  Constitutional:      General: She is not irritable.    Appearance: She is well-developed.  HENT:     Head: Normocephalic.     Right Ear: Tympanic membrane, ear canal and external ear normal.     Left Ear: Tympanic membrane, ear canal and external ear normal.     Nose: Nose normal. No congestion or rhinorrhea.     Mouth/Throat:     Mouth: Mucous membranes are moist.  Eyes:     General: Lids are everted, no foreign bodies appreciated. No scleral icterus.       Left eye: No foreign body or hordeolum.     Conjunctiva/sclera: Conjunctivae normal.     Right eye: Right conjunctiva is not injected.     Left eye: Left conjunctiva is not injected.     Pupils: Pupils are equal, round, and reactive to light.  Neck:     Thyroid: No thyromegaly.     Vascular: No JVD.     Trachea: No tracheal deviation.  Cardiovascular:     Rate and Rhythm: Normal rate and regular rhythm.     Pulses: Normal pulses.     Heart sounds: Normal heart sounds, S1 normal and S2 normal. No murmur. No systolic murmur. No diastolic murmur. No friction rub. No gallop. No S3 or S4 sounds.   Pulmonary:     Effort: Pulmonary effort is normal. No respiratory distress.     Breath sounds: Normal breath sounds. No wheezing, rhonchi or rales.  Abdominal:     General: Bowel sounds are normal.     Palpations: Abdomen is soft. There is no mass.     Tenderness: There is no abdominal tenderness. There is no guarding or rebound.  Musculoskeletal:        General: No tenderness. Normal range of motion.     Cervical back: Normal range of motion and neck supple.     Right lower leg: No edema.     Left lower leg: No edema.  Lymphadenopathy:     Cervical: No cervical adenopathy.  Skin:    General:  Skin is warm.     Findings: No rash.  Neurological:     Mental Status: She is alert and oriented to person, place, and time.     Cranial Nerves: No cranial nerve deficit.     Deep Tendon Reflexes: Reflexes normal.  Psychiatric:        Mood and Affect: Mood is not anxious or depressed.     Wt Readings from Last 3 Encounters:  11/13/19 163 lb (73.9 kg)  10/05/19 161  lb (73 kg)  10/02/19 160 lb 8 oz (72.8 kg)    BP 110/60   Pulse 76   Ht 5\' 7"  (1.702 m)   Wt 163 lb (73.9 kg)   BMI 25.53 kg/m   Assessment and Plan:  1. Cigarette nicotine dependence without complication Patient states that she has stopped smoking but it is her son who will not quit smoking and we have discussed with him about limiting secondhand smoke and not smoking in the house. 2. Recurrent major depressive disorder, in partial remission (HCC) Chronic.  Controlled.  Stable.  Patient has PHQ of 5.  Will continue bupropion 100 mg 1 twice a day. - buPROPion (WELLBUTRIN) 100 MG tablet; Take 1 tablet (100 mg total) by mouth 2 (two) times daily.  Dispense: 180 tablet; Refill: 1  3. H/O: upper GI bleed Chronic.  Persistent.  Stable.  History of GI bleed.  We will continue patient on omeprazole 40 mg once a day.  4.  Coronary artery disease.  Patient is followed by Dr. Jalene Mullet in [facility].  Patient has a cane duration of coronary artery disease involving the native coronary artery of native heart without angina.  As well as chronic combined systolic diastolic congestive heart failure.  As well as PAF for which she is on long-term use of anticoagulants.  Patient is been for multiple reasons that we do not understand been unable to go in and have her PT/INR checks and we are reestablishing this in March.  In the meantime patient has been encouraged to continue her medications and reestablishing with cardiology.

## 2019-11-16 DIAGNOSIS — Z7901 Long term (current) use of anticoagulants: Secondary | ICD-10-CM | POA: Diagnosis not present

## 2019-11-16 DIAGNOSIS — I4891 Unspecified atrial fibrillation: Secondary | ICD-10-CM | POA: Diagnosis not present

## 2019-11-30 DIAGNOSIS — Z7901 Long term (current) use of anticoagulants: Secondary | ICD-10-CM | POA: Diagnosis not present

## 2019-11-30 DIAGNOSIS — I4891 Unspecified atrial fibrillation: Secondary | ICD-10-CM | POA: Diagnosis not present

## 2019-12-09 DIAGNOSIS — I4891 Unspecified atrial fibrillation: Secondary | ICD-10-CM | POA: Diagnosis not present

## 2019-12-09 DIAGNOSIS — Z7901 Long term (current) use of anticoagulants: Secondary | ICD-10-CM | POA: Diagnosis not present

## 2019-12-15 DIAGNOSIS — I4891 Unspecified atrial fibrillation: Secondary | ICD-10-CM | POA: Diagnosis not present

## 2019-12-15 DIAGNOSIS — Z7901 Long term (current) use of anticoagulants: Secondary | ICD-10-CM | POA: Diagnosis not present

## 2019-12-29 DIAGNOSIS — Z7901 Long term (current) use of anticoagulants: Secondary | ICD-10-CM | POA: Diagnosis not present

## 2019-12-29 DIAGNOSIS — I4891 Unspecified atrial fibrillation: Secondary | ICD-10-CM | POA: Diagnosis not present

## 2020-01-12 DIAGNOSIS — I4891 Unspecified atrial fibrillation: Secondary | ICD-10-CM | POA: Diagnosis not present

## 2020-01-12 DIAGNOSIS — Z7901 Long term (current) use of anticoagulants: Secondary | ICD-10-CM | POA: Diagnosis not present

## 2020-01-21 ENCOUNTER — Encounter: Payer: PPO | Admitting: Family Medicine

## 2020-01-21 ENCOUNTER — Other Ambulatory Visit: Payer: Self-pay

## 2020-01-21 ENCOUNTER — Encounter: Payer: Self-pay | Admitting: Family Medicine

## 2020-01-21 DIAGNOSIS — Z882 Allergy status to sulfonamides status: Secondary | ICD-10-CM | POA: Diagnosis not present

## 2020-01-21 DIAGNOSIS — R519 Headache, unspecified: Secondary | ICD-10-CM | POA: Diagnosis not present

## 2020-01-21 DIAGNOSIS — Z880 Allergy status to penicillin: Secondary | ICD-10-CM | POA: Diagnosis not present

## 2020-01-21 DIAGNOSIS — J029 Acute pharyngitis, unspecified: Secondary | ICD-10-CM | POA: Diagnosis not present

## 2020-01-21 DIAGNOSIS — K047 Periapical abscess without sinus: Secondary | ICD-10-CM | POA: Diagnosis not present

## 2020-01-21 DIAGNOSIS — I2510 Atherosclerotic heart disease of native coronary artery without angina pectoris: Secondary | ICD-10-CM | POA: Diagnosis not present

## 2020-01-21 DIAGNOSIS — H9209 Otalgia, unspecified ear: Secondary | ICD-10-CM | POA: Diagnosis not present

## 2020-01-21 DIAGNOSIS — K580 Irritable bowel syndrome with diarrhea: Secondary | ICD-10-CM | POA: Diagnosis not present

## 2020-01-21 DIAGNOSIS — R220 Localized swelling, mass and lump, head: Secondary | ICD-10-CM | POA: Diagnosis not present

## 2020-01-21 DIAGNOSIS — Z7901 Long term (current) use of anticoagulants: Secondary | ICD-10-CM | POA: Diagnosis not present

## 2020-01-21 DIAGNOSIS — I480 Paroxysmal atrial fibrillation: Secondary | ICD-10-CM | POA: Diagnosis not present

## 2020-01-21 DIAGNOSIS — M272 Inflammatory conditions of jaws: Secondary | ICD-10-CM | POA: Diagnosis not present

## 2020-01-21 DIAGNOSIS — I4891 Unspecified atrial fibrillation: Secondary | ICD-10-CM | POA: Diagnosis not present

## 2020-01-21 NOTE — Progress Notes (Signed)
Pt not seen- sent to ER

## 2020-01-25 DIAGNOSIS — K083 Retained dental root: Secondary | ICD-10-CM | POA: Diagnosis not present

## 2020-01-28 DIAGNOSIS — K083 Retained dental root: Secondary | ICD-10-CM | POA: Diagnosis not present

## 2020-02-03 ENCOUNTER — Ambulatory Visit: Payer: PPO

## 2020-02-15 ENCOUNTER — Ambulatory Visit: Payer: PPO

## 2020-02-19 DIAGNOSIS — I4891 Unspecified atrial fibrillation: Secondary | ICD-10-CM | POA: Diagnosis not present

## 2020-02-19 DIAGNOSIS — Z7901 Long term (current) use of anticoagulants: Secondary | ICD-10-CM | POA: Diagnosis not present

## 2020-02-22 ENCOUNTER — Ambulatory Visit: Payer: Self-pay | Admitting: *Deleted

## 2020-02-22 ENCOUNTER — Other Ambulatory Visit: Payer: Self-pay

## 2020-02-22 ENCOUNTER — Ambulatory Visit (INDEPENDENT_AMBULATORY_CARE_PROVIDER_SITE_OTHER): Payer: PPO | Admitting: Family Medicine

## 2020-02-22 ENCOUNTER — Encounter: Payer: Self-pay | Admitting: Family Medicine

## 2020-02-22 VITALS — BP 100/64 | HR 72 | Temp 99.6°F | Ht 67.0 in | Wt 164.0 lb

## 2020-02-22 DIAGNOSIS — Z8719 Personal history of other diseases of the digestive system: Secondary | ICD-10-CM | POA: Diagnosis not present

## 2020-02-22 DIAGNOSIS — Z8673 Personal history of transient ischemic attack (TIA), and cerebral infarction without residual deficits: Secondary | ICD-10-CM | POA: Diagnosis not present

## 2020-02-22 DIAGNOSIS — J0100 Acute maxillary sinusitis, unspecified: Secondary | ICD-10-CM | POA: Diagnosis not present

## 2020-02-22 DIAGNOSIS — I4891 Unspecified atrial fibrillation: Secondary | ICD-10-CM | POA: Diagnosis not present

## 2020-02-22 DIAGNOSIS — I480 Paroxysmal atrial fibrillation: Secondary | ICD-10-CM | POA: Diagnosis not present

## 2020-02-22 DIAGNOSIS — Z7901 Long term (current) use of anticoagulants: Secondary | ICD-10-CM | POA: Diagnosis not present

## 2020-02-22 MED ORDER — DOXYCYCLINE HYCLATE 100 MG PO TABS: 100.0000 mg | ORAL_TABLET | Freq: Two times a day (BID) | ORAL | 0 refills | Status: DC

## 2020-02-22 NOTE — Telephone Encounter (Signed)
Per inititial encounter, "Patient experiencing nasal congestion, head pain, soar throat and ear ache. Patient states she has a heart condition and unsure what OTC cough medication she can take. Patient states she's been vaccinated"; contacted pt regarding her symptoms; the pt says she is coughing and sneezing all the time; she also says she has a constant runny nose; her symptoms started 02/20/20; her cough is not productive; recommendations made per nurse triage protocol; she verbalized understanding; decision tree completed; the pt states she does not have a smart phone; the pt sees Dr Ronnald Ramp, [place]; spoke with Estill Bamberg at office regarding scheduling pt, and she states it is ok to schedule pt for office visit; pt offered and accepted visit with Dr Otilio Miu 02/22/20 at 1440; she verbalized understanding; will route to office for notification;    Reason for Disposition . [1] Continuous (nonstop) coughing interferes with work or school AND [2] no improvement using cough treatment per protocol  Answer Assessment - Initial Assessment Questions 1. ONSET: "When did the cough begin?"      02/20/20 2. SEVERITY: "How bad is the cough today?"       3. RESPIRATORY DISTRESS: "Describe your breathing."      Breathing ok 4. FEVER: "Do you have a fever?" If so, ask: "What is your temperature, how was it measured, and when did it start?"     Not sure; can not use thermometer 5. HEMOPTYSIS: "Are you coughing up any blood?" If so ask: "How much?" (flecks, streaks, tablespoons, etc.)     no 6. TREATMENT: "What have you done so far to treat the cough?" (e.g., meds, fluids, humidifier)      7. CARDIAC HISTORY: "Do you have any history of heart disease?" (e.g., heart attack, congestive heart failure)      History MI 8. LUNG HISTORY: "Do you have any history of lung disease?"  (e.g., pulmonary embolus, asthma, emphysema)     9. PE RISK FACTORS: "Do you have a history of blood clots?" (or: recent major  surgery, recent prolonged travel, bedridden)      10. OTHER SYMPTOMS: "Do you have any other symptoms? (e.g., runny nose, wheezing, chest pain)       Sore throat, runny nose and nasal congestion, sneezing 11. PREGNANCY: "Is there any chance you are pregnant?" "When was your last menstrual period?"      no 12. TRAVEL: "Have you traveled out of the country in the last month?" (e.g., travel history, exposures)  Protocols used: COUGH - ACUTE NON-PRODUCTIVE-A-AH

## 2020-02-22 NOTE — Progress Notes (Signed)
Date:  02/22/2020   Name:  Veronica Ellis   DOB:  [birth year]   MRN:  [id number]   Chief Complaint: Cough (non productive, runny nose, sneezing)  Cough This is a new problem. The current episode started in the past 7 days. The problem has been waxing and waning. The cough is productive of sputum. Associated symptoms include chills, a fever, headaches, nasal congestion, postnasal drip, rhinorrhea and shortness of breath. Pertinent negatives include no chest pain, ear congestion, ear pain, eye redness, heartburn, hemoptysis, myalgias, rash, sore throat, sweats, weight loss or wheezing. Nothing aggravates the symptoms. Risk factors for lung disease include smoking/tobacco exposure. She has tried nothing for the symptoms. There is no history of environmental allergies.  Sinusitis This is a new problem. The current episode started in the past 7 days. The maximum temperature recorded prior to her arrival was 100.4 - 100.9 F. She is experiencing no pain. Associated symptoms include chills, congestion, coughing, headaches, shortness of breath and sinus pressure. Pertinent negatives include no diaphoresis, ear pain, hoarse voice, neck pain, sneezing, sore throat or swollen glands.  GI Problem The primary symptoms include fever and abdominal pain. Primary symptoms do not include weight loss, fatigue, nausea, vomiting, diarrhea, melena, hematemesis, jaundice, hematochezia, dysuria, myalgias, arthralgias or rash.  The illness is also significant for chills. The illness does not include constipation or back pain.    Lab Results  Component Value Date   CREATININE 1.12 (H) 04/17/2019   BUN 20 04/17/2019   NA 140 04/17/2019   K 4.3 04/17/2019   CL 109 04/17/2019   CO2 22 04/17/2019   Lab Results  Component Value Date   CHOL 189 05/20/2018   HDL 52 05/20/2018   LDLCALC 113 (H) 05/20/2018   TRIG 119 05/20/2018   CHOLHDL 3.6 05/20/2018   Lab Results  Component Value Date   TSH 5.789 (H)  05/19/2018   Lab Results  Component Value Date   HGBA1C 5.4 05/19/2018   Lab Results  Component Value Date   WBC 6.3 04/17/2019   HGB 13.5 04/17/2019   HCT 42.2 04/17/2019   MCV 90.8 04/17/2019   PLT 281 04/17/2019   Lab Results  Component Value Date   ALT 9 (L) 12/25/2016   AST 17 12/25/2016   ALKPHOS 63 12/25/2016   BILITOT 0.6 12/25/2016     Review of Systems  Constitutional: Positive for chills and fever. Negative for diaphoresis, fatigue, unexpected weight change and weight loss.  HENT: Positive for congestion, postnasal drip, rhinorrhea and sinus pressure. Negative for ear discharge, ear pain, hoarse voice, sneezing and sore throat.   Eyes: Negative for photophobia, pain, discharge, redness and itching.  Respiratory: Positive for cough and shortness of breath. Negative for hemoptysis, wheezing and stridor.   Cardiovascular: Negative for chest pain, palpitations and leg swelling.  Gastrointestinal: Positive for abdominal pain. Negative for blood in stool, constipation, diarrhea, heartburn, hematemesis, hematochezia, jaundice, melena, nausea and vomiting.  Endocrine: Negative for cold intolerance, heat intolerance, polydipsia, polyphagia and polyuria.  Genitourinary: Negative for dysuria, flank pain, frequency, hematuria, menstrual problem, pelvic pain, urgency, vaginal bleeding and vaginal discharge.  Musculoskeletal: Negative for arthralgias, back pain, myalgias and neck pain.  Skin: Negative for rash.  Allergic/Immunologic: Negative for environmental allergies and food allergies.  Neurological: Positive for headaches. Negative for dizziness, weakness, light-headedness and numbness.  Hematological: Negative for adenopathy. Does not bruise/bleed easily.  Psychiatric/Behavioral: Negative for dysphoric mood. The patient is not nervous/anxious.     Patient  Active Problem List   Diagnosis Date Noted  . History of IBS 09/26/2018  . Long term (current) use of anticoagulants  07/17/2018  . 3-vessel CAD 07/08/2018  . Chronic combined systolic and diastolic CHF (congestive heart failure) ([place]) 07/08/2018  . Ischemic cardiomyopathy 07/08/2018  . AKI (acute kidney injury) ([place]) 06/06/2018  . Hyperkalemia 06/05/2018  . Atrial fibrillation with RVR ([place]) 05/21/2018  . Microcytic anemia 05/21/2018  . NSTEMI (non-ST elevated myocardial infarction) ([place]) 05/19/2018  . Acute on chronic systolic heart failure ([place]) 07/30/2017  . LBBB (left bundle branch block) 07/10/2017  . Lumbar radiculopathy 05/16/2017  . Hx of low back pain 05/14/2017  . Ataxia 12/31/2016  . Memory loss 12/31/2016  . Recurrent major depressive disorder, in partial remission ([place]) 12/31/2016  . Bipolar 1 disorder, mixed, moderate ([place]) 12/25/2016  . Chronic bilateral low back pain with bilateral sciatica 10/18/2016  . Bilateral breast cancer ([place]) 02/20/2016  . 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 Cerebrovascular disease 02/16/2015  . Anxiety 02/16/2015  . Breast lump 02/16/2015  . 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[place]) 02/16/2015  . 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 Routine general medical examination at a health care facility 02/16/2015  . Recurrent major depressive episodes ([place]) 02/16/2015  . Below normal amount of sodium in the blood 02/16/2015  . Pre-operative cardiovascular examination 02/16/2015    Allergies  Allergen Reactions  . Iodine Nausea Only    Betadine okay   . Erythromycin Rash  . Penicillins Rash    Has patient had a PCN reaction causing immediate rash, facial/tongue/throat swelling, SOB or lightheadedness with hypotension: No Has patient had a PCN reaction causing severe rash involving mucus membranes or skin necrosis: No Has patient had a PCN reaction that required hospitalization: No Has patient had a PCN reaction occurring within the last 10 years: No If all of the above answers are "NO", then may proceed with Cephalosporin use.   Marland Kitchen  Shellfish Allergy Rash    Past Surgical History:  Procedure Laterality Date  . APPENDECTOMY    . BREAST BIOPSY Bilateral 2014  . BREAST LUMPECTOMY Bilateral 05/2013  . BREAST SURGERY Bilateral   . CATARACT EXTRACTION W/PHACO Right 12/11/2017   Procedure: CATARACT EXTRACTION PHACO AND INTRAOCULAR LENS PLACEMENT ([place]) COMPLICATED RIGHT;  Surgeon: Leandrew Koyanagi, MD;  Location: [facility];  Service: Ophthalmology;  Laterality: Right;  . COLON SURGERY     12 in. removed due to polyps  . LEFT HEART CATH AND CORONARY ANGIOGRAPHY N/A 05/20/2018   Procedure: LEFT HEART CATH AND CORONARY ANGIOGRAPHY;  Surgeon: Corey Skains, MD;  Location: [facility];  Service: Cardiovascular;  Laterality: N/A;  . TUBAL LIGATION      Social History   Tobacco Use  . Smoking status: Former Smoker    Packs/day: 1.00    Years: 60.00    Pack years: 60.00    Types: Cigarettes    Quit date: 12/2017    Years since quitting: 2.2  . Smokeless tobacco: Never Used  . Tobacco comment: smoking cessation materials not required  Vaping Use  . Vaping Use: Never used  Substance Use Topics  . Alcohol use: Not Currently    Alcohol/week: 0.0 standard drinks  . Drug use: No     Medication list has been reviewed and updated.  Current Meds  Medication Sig  . atorvastatin (LIPITOR) 80 MG tablet Take 80 mg by mouth daily.  Marland Kitchen buPROPion (WELLBUTRIN) 100 MG tablet Take 1 tablet (100 mg total)  by mouth 2 (two) times daily.  Marland Kitchen gabapentin (NEURONTIN) 300 MG capsule Take 300 mg by mouth 2 (two) times daily. neurology  . metoprolol succinate (TOPROL-XL) 25 MG 24 hr tablet Take 25 mg by mouth daily.  . nitroGLYCERIN (NITROSTAT) 0.4 MG SL tablet Place 0.4 mg under the tongue every 5 (five) minutes as needed for chest pain.  Marland Kitchen omeprazole (PRILOSEC) 40 MG capsule Take 1 capsule (40 mg total) by mouth 2 (two) times daily.  Marland Kitchen saccharomyces boulardii (FLORASTOR) 250 MG capsule Take 250 mg by mouth daily.     . sacubitril-valsartan (ENTRESTO) 97-103 MG Take 0.5 tablets by mouth 2 (two) times daily. (Patient taking differently: Take 0.5 tablets by mouth 2 (two) times daily. Dr Domingo Cocking)  . torsemide (DEMADEX) 10 MG tablet Take 1 tablet by mouth daily.  Marland Kitchen warfarin (COUMADIN) 1 MG tablet     PHQ 2/9 Scores 01/21/2020 11/13/2019 10/05/2019 05/11/2019  PHQ - 2 Score 0 0 0 0  PHQ- 9 Score 0 5 0 0    GAD 7 : Generalized Anxiety Score 01/21/2020 11/13/2019 10/05/2019  Nervous, Anxious, on Edge 0 1 0  Control/stop worrying 0 2 0  Worry too much - different things 0 2 0  Trouble relaxing 0 1 0  Restless 0 0 0  Easily annoyed or irritable 0 0 0  Afraid - awful might happen 0 0 0  Total GAD 7 Score 0 6 0  Anxiety Difficulty - Not difficult at all -    BP Readings from Last 3 Encounters:  02/22/20 100/64  11/13/19 110/60  10/05/19 138/70    Physical Exam Vitals and nursing note reviewed.  Constitutional:      General: She is not in acute distress.    Appearance: She is not diaphoretic.  HENT:     Head: Normocephalic and atraumatic.     Jaw: There is normal jaw occlusion.     Right Ear: Tympanic membrane and external ear normal.     Left Ear: Tympanic membrane and external ear normal.     Nose:     Right Turbinates: Not enlarged, swollen or pale.     Left Turbinates: Not enlarged, swollen or pale.     Right Sinus: Maxillary sinus tenderness present.     Left Sinus: Maxillary sinus tenderness present.  Eyes:     General:        Right eye: No discharge.        Left eye: No discharge.     Conjunctiva/sclera: Conjunctivae normal.     Pupils: Pupils are equal, round, and reactive to light.  Neck:     Thyroid: No thyromegaly.     Vascular: No JVD.  Cardiovascular:     Rate and Rhythm: Normal rate and regular rhythm.     Heart sounds: Normal heart sounds. No murmur heard.  No friction rub. No gallop.   Pulmonary:     Effort: Pulmonary effort is normal.     Breath sounds: Normal breath sounds.  No decreased breath sounds, wheezing, rhonchi or rales.  Abdominal:     General: Bowel sounds are normal.     Palpations: Abdomen is soft. There is no mass.     Tenderness: There is no abdominal tenderness. There is no guarding.  Genitourinary:    Rectum: Normal. Guaiac result negative. No mass, tenderness or anal fissure.  Musculoskeletal:        General: Normal range of motion.     Cervical back: Normal range of  motion and neck supple.  Lymphadenopathy:     Head:     Right side of head: No submandibular adenopathy.     Left side of head: No submandibular adenopathy.     Cervical: No cervical adenopathy.     Right cervical: No superficial, deep or posterior cervical adenopathy.    Left cervical: No superficial, deep or posterior cervical adenopathy.  Skin:    General: Skin is warm and dry.  Neurological:     Mental Status: She is alert.     Deep Tendon Reflexes: Reflexes are normal and symmetric.     Wt Readings from Last 3 Encounters:  02/22/20 164 lb (74.4 kg)  11/13/19 163 lb (73.9 kg)  10/05/19 161 lb (73 kg)    BP 100/64   Pulse 72   Temp 99.6 F (37.6 C) (Oral)   Ht 5\' 7"  (1.702 m)   Wt 164 lb (74.4 kg)   SpO2 97%   BMI 25.69 kg/m   Assessment and Plan: 1. Acute non-recurrent maxillary sinusitis New onset.  Persistent.  Relatively stable.  Patient has tenderness over the maxillary sinuses consistent with an acute maxillary sinusitis.  Will initiate doxycycline 100 mg twice a day for 10 days.  Patient does have a low-grade fever but does not have a serious cough nor dyspnea.  At this point in time I do not think she is Covid positive in the event that she has been vaccinated.  However if fever should continue and does not resolve with current treatment patient is to call back and we will proceed with evaluation for Covid.  Covid precautions were taken with seen patient today.  2. H/O: upper GI bleed Patient has a history of GI bleed and has noticed some  dark-colored bowel movements.  But this sounds like it is more secondary to Pepto-Bismol.  Rectal exam was guaiac negative but we will give her 3 more guaiacs to check and if any of these are positive we will be referring to GI.

## 2020-02-22 NOTE — Telephone Encounter (Signed)
Will see this afternoon 

## 2020-02-26 ENCOUNTER — Telehealth: Payer: Self-pay

## 2020-02-26 NOTE — Telephone Encounter (Signed)
Lab called told me that pt had dropped her stool cards off and they where "overfilled" and there was no other information on the card but her name. Lab said that they could not test the stool cards that she would have to repeat it and fill the card out with the correct information.  KP

## 2020-02-29 NOTE — Telephone Encounter (Signed)
Called pt talked to her son ED. Told him that pt would need to repeat the stool cards. Told her son to come in and get the cards and once they are complete to turn them back in to the office. He verbalized understanding.  KP

## 2020-02-29 NOTE — Telephone Encounter (Signed)
Call and tell her to redo cards and turn into front

## 2020-03-01 ENCOUNTER — Other Ambulatory Visit (INDEPENDENT_AMBULATORY_CARE_PROVIDER_SITE_OTHER): Payer: PPO

## 2020-03-01 DIAGNOSIS — Z8719 Personal history of other diseases of the digestive system: Secondary | ICD-10-CM

## 2020-03-01 DIAGNOSIS — R195 Other fecal abnormalities: Secondary | ICD-10-CM

## 2020-03-01 LAB — HEMOCCULT GUIAC POC 1CARD (OFFICE)
Card #2 Fecal Occult Blod, POC: POSITIVE
Card #3 Fecal Occult Blood, POC: NEGATIVE
Fecal Occult Blood, POC: POSITIVE — AB

## 2020-03-01 NOTE — Progress Notes (Signed)
Put in referral for GI and 2 out of 3 positive guaiacs

## 2020-03-02 ENCOUNTER — Telehealth: Payer: Self-pay | Admitting: Family Medicine

## 2020-03-02 NOTE — Telephone Encounter (Signed)
Called pt and spoke with care giver. Pt is going to await call from GI referral to be scheduled for positive hemoccult cards.   CM

## 2020-03-02 NOTE — Telephone Encounter (Signed)
Copied from [place] [id number]. Topic: General - Call Back - No Documentation >> Mar 01, 2020  6:05 PM Erick Blinks wrote: Pt would like a call back from [place]. States that she is feeling a little better, she only has one more day of her antibiotic.  Best contact: [phone]

## 2020-03-02 NOTE — Telephone Encounter (Signed)
Pts caregiver (son) would like for calls to be made to his cell phone. [phone].  KP

## 2020-03-03 ENCOUNTER — Ambulatory Visit: Payer: PPO | Attending: Internal Medicine

## 2020-03-04 ENCOUNTER — Other Ambulatory Visit: Payer: Self-pay

## 2020-03-04 ENCOUNTER — Encounter: Payer: Self-pay | Admitting: Radiology

## 2020-03-04 ENCOUNTER — Emergency Department: Payer: PPO

## 2020-03-04 ENCOUNTER — Emergency Department
Admission: EM | Admit: 2020-03-04 | Discharge: 2020-03-04 | Disposition: A | Discharge status: Home / Self Care | Payer: PPO | Attending: Emergency Medicine | Admitting: Emergency Medicine

## 2020-03-04 DIAGNOSIS — R197 Diarrhea, unspecified: Secondary | ICD-10-CM

## 2020-03-04 DIAGNOSIS — K921 Melena: Secondary | ICD-10-CM | POA: Diagnosis not present

## 2020-03-04 DIAGNOSIS — I252 Old myocardial infarction: Secondary | ICD-10-CM | POA: Insufficient documentation

## 2020-03-04 DIAGNOSIS — R109 Unspecified abdominal pain: Secondary | ICD-10-CM | POA: Diagnosis not present

## 2020-03-04 DIAGNOSIS — I10 Essential (primary) hypertension: Secondary | ICD-10-CM | POA: Insufficient documentation

## 2020-03-04 DIAGNOSIS — Z9886 Personal history of breast implant removal: Secondary | ICD-10-CM | POA: Diagnosis not present

## 2020-03-04 DIAGNOSIS — Z79899 Other long term (current) drug therapy: Secondary | ICD-10-CM | POA: Diagnosis not present

## 2020-03-04 DIAGNOSIS — N838 Other noninflammatory disorders of ovary, fallopian tube and broad ligament: Secondary | ICD-10-CM | POA: Diagnosis not present

## 2020-03-04 DIAGNOSIS — Z888 Allergy status to other drugs, medicaments and biological substances status: Secondary | ICD-10-CM | POA: Insufficient documentation

## 2020-03-04 DIAGNOSIS — Z880 Allergy status to penicillin: Secondary | ICD-10-CM | POA: Diagnosis not present

## 2020-03-04 DIAGNOSIS — Z79891 Long term (current) use of opiate analgesic: Secondary | ICD-10-CM | POA: Insufficient documentation

## 2020-03-04 DIAGNOSIS — I700 Atherosclerosis of aorta: Secondary | ICD-10-CM | POA: Diagnosis not present

## 2020-03-04 DIAGNOSIS — F17210 Nicotine dependence, cigarettes, uncomplicated: Secondary | ICD-10-CM | POA: Diagnosis not present

## 2020-03-04 DIAGNOSIS — Z91013 Allergy to seafood: Secondary | ICD-10-CM | POA: Insufficient documentation

## 2020-03-04 DIAGNOSIS — R1032 Left lower quadrant pain: Secondary | ICD-10-CM | POA: Diagnosis not present

## 2020-03-04 DIAGNOSIS — Z7901 Long term (current) use of anticoagulants: Secondary | ICD-10-CM | POA: Diagnosis not present

## 2020-03-04 DIAGNOSIS — I2510 Atherosclerotic heart disease of native coronary artery without angina pectoris: Secondary | ICD-10-CM | POA: Diagnosis not present

## 2020-03-04 DIAGNOSIS — R195 Other fecal abnormalities: Secondary | ICD-10-CM | POA: Diagnosis not present

## 2020-03-04 DIAGNOSIS — N1330 Unspecified hydronephrosis: Secondary | ICD-10-CM | POA: Diagnosis not present

## 2020-03-04 DIAGNOSIS — I509 Heart failure, unspecified: Secondary | ICD-10-CM | POA: Insufficient documentation

## 2020-03-04 DIAGNOSIS — Z881 Allergy status to other antibiotic agents status: Secondary | ICD-10-CM | POA: Insufficient documentation

## 2020-03-04 DIAGNOSIS — N83202 Unspecified ovarian cyst, left side: Secondary | ICD-10-CM | POA: Diagnosis not present

## 2020-03-04 LAB — CBC
HCT: 38.1 % (ref 36.0–46.0)
Hemoglobin: 12.6 g/dL (ref 12.0–15.0)
MCH: 29.7 pg (ref 26.0–34.0)
MCHC: 33.1 g/dL (ref 30.0–36.0)
MCV: 89.9 fL (ref 80.0–100.0)
Platelets: 284 10*3/uL (ref 150–400)
RBC: 4.24 MIL/uL (ref 3.87–5.11)
RDW: 13.8 % (ref 11.5–15.5)
WBC: 8.9 10*3/uL (ref 4.0–10.5)
nRBC: 0 % (ref 0.0–0.2)

## 2020-03-04 LAB — GASTROINTESTINAL PANEL BY PCR, STOOL (REPLACES STOOL CULTURE)

## 2020-03-04 LAB — URINALYSIS, COMPLETE (UACMP) WITH MICROSCOPIC
Bilirubin Urine: NEGATIVE
Glucose, UA: NEGATIVE mg/dL
Hgb urine dipstick: NEGATIVE
Ketones, ur: NEGATIVE mg/dL
Leukocytes,Ua: NEGATIVE
Nitrite: NEGATIVE
Protein, ur: NEGATIVE mg/dL
Specific Gravity, Urine: 1.018 (ref 1.005–1.030)
Squamous Epithelial / HPF: NONE SEEN (ref 0–5)
pH: 5 (ref 5.0–8.0)

## 2020-03-04 LAB — COMPREHENSIVE METABOLIC PANEL
ALT: 27 U/L (ref 0–44)
AST: 22 U/L (ref 15–41)
Albumin: 3.3 g/dL — ABNORMAL LOW (ref 3.5–5.0)
Alkaline Phosphatase: 73 U/L (ref 38–126)
Anion gap: 10 (ref 5–15)
BUN: 30 mg/dL — ABNORMAL HIGH (ref 8–23)
CO2: 23 mmol/L (ref 22–32)
Calcium: 9 mg/dL (ref 8.9–10.3)
Chloride: 107 mmol/L (ref 98–111)
Creatinine, Ser: 1.29 mg/dL — ABNORMAL HIGH (ref 0.44–1.00)
GFR calc Af Amer: 44 mL/min — ABNORMAL LOW (ref >60)
GFR calc non Af Amer: 38 mL/min — ABNORMAL LOW (ref >60)
Glucose, Bld: 110 mg/dL — ABNORMAL HIGH (ref 70–99)
Potassium: 4.3 mmol/L (ref 3.5–5.1)
Sodium: 140 mmol/L (ref 135–145)
Total Bilirubin: 1.3 mg/dL — ABNORMAL HIGH (ref 0.3–1.2)
Total Protein: 6.2 g/dL — ABNORMAL LOW (ref 6.5–8.1)

## 2020-03-04 LAB — C DIFFICILE QUICK SCREEN W PCR REFLEX
C Diff antigen: POSITIVE — AB
C Diff toxin: NEGATIVE

## 2020-03-04 LAB — CLOSTRIDIUM DIFFICILE BY PCR, REFLEXED: Toxigenic C. Difficile by PCR: NEGATIVE

## 2020-03-04 LAB — PROTIME-INR
INR: 1.7 — ABNORMAL HIGH (ref 0.8–1.2)
Prothrombin Time: 18.9 seconds — ABNORMAL HIGH (ref 11.4–15.2)

## 2020-03-04 LAB — LIPASE, BLOOD: Lipase: 27 U/L (ref 11–51)

## 2020-03-04 MED ORDER — IOHEXOL 300 MG/ML  SOLN
75.0000 mL | Freq: Once | INTRAMUSCULAR | Status: AC | PRN
  Administered 2020-03-04: 75 mL via INTRAVENOUS

## 2020-03-04 MED ORDER — SODIUM CHLORIDE 0.9 % IV BOLUS
1000.0000 mL | Freq: Once | INTRAVENOUS | Status: AC
  Administered 2020-03-04: 1000 mL via INTRAVENOUS

## 2020-03-04 MED ORDER — ONDANSETRON HCL 4 MG/2ML IJ SOLN
4.0000 mg | Freq: Once | INTRAMUSCULAR | Status: AC
  Administered 2020-03-04: 4 mg via INTRAVENOUS
  Filled 2020-03-04: qty 2

## 2020-03-04 MED ORDER — IOHEXOL 9 MG/ML PO SOLN
1000.0000 mL | Freq: Once | ORAL | Status: AC | PRN
  Administered 2020-03-04: 1000 mL via ORAL

## 2020-03-04 NOTE — ED Notes (Signed)
Peripheral IV discontinued. Catheter intact. No signs of infiltration or redness. Gauze applied to IV site.   Discharge instructions reviewed with patient. Questions fielded by this RN. Patient verbalizes understanding of instructions. Patient discharged home in stable condition per [facility] . No acute distress noted at time of discharge.   pt wheeled to son's car in ED lobby front, pt has all belongings

## 2020-03-04 NOTE — ED Notes (Signed)
Verbal for temporary foley cath placement to collect both urine and stool samples.

## 2020-03-04 NOTE — ED Notes (Signed)
Pt leaving for imaging.

## 2020-03-04 NOTE — Discharge Instructions (Signed)
Please seek medical attention for any high fevers, chest pain, shortness of breath, change in behavior, persistent vomiting, bloody stool or any other new or concerning symptoms.  

## 2020-03-04 NOTE — ED Notes (Signed)
Pt family called at pt's request for pick up with DC pending  Pt changing clothes

## 2020-03-04 NOTE — ED Notes (Signed)
This RN at bedside while EDP Paduchowski completing rectal exam.

## 2020-03-04 NOTE — ED Notes (Signed)
Foley cath removed .

## 2020-03-04 NOTE — ED Notes (Addendum)
Pt in from home with recent blood noted in her BMs. States history of loose BMs d/t IBS. Pt given warm blankets. Pt initially anxious on arrival to room but calmed by this RN reassuring her. Bed locked low. Rail up. Call bell within reach. Pt c/o 8/10 sharp medial/mid abdominal pain. States has been anywhere between 2/10-8/10 today. Pt tender to palpation; soft. Pt placed on cardiac monitor. Pt's sacrum red/raw with breakdown from moisture.

## 2020-03-04 NOTE — ED Notes (Signed)
Pt given food tray and drink with verbal okay from [place]. HOB adjusted for pt.

## 2020-03-04 NOTE — ED Notes (Signed)
Pt notified urine sample needed. Hat placed in toilet. Pt educated to use call bell to notify this RN when she can provide sample. Pt agreeable to this. Bed locked low. Rail up. Call bell within reach.

## 2020-03-04 NOTE — ED Notes (Signed)
Pt assisted onto bedpan. Pt stated she needs to urinate and have BM again. Will send a sample if one or the other and not mixed.

## 2020-03-04 NOTE — ED Notes (Signed)
Sylvester Harder RN, and this RN at bedside placing foley cath. Pt agreeable.

## 2020-03-04 NOTE — ED Notes (Signed)
Pt assisted to toilet. Had loose BM. Sent sample to lab. Changed briefs, bed pad, and gown. Pt provided self with peri care.

## 2020-03-04 NOTE — ED Provider Notes (Signed)
[facility] Emergency Department Provider Note  Time seen: 2:15 PM  I have reviewed the triage vital signs and the nursing notes.   HISTORY  Chief Complaint Abdominal pain and rectal bleeding  HPI Veronica Ellis is a 86 y.o. female with a past medical history anxiety, arthritis, depression, hypertension, hyperlipidemia who presents to the emergency department for abdominal pain and dark stool.  According to the patient over the past week or so she has been experiencing intermittent cold/sinus symptoms.  Completed an antibiotic.  States she has been experiencing loose stool and vague abdominal pain.  Denies any fever.  Describes abdominal pain as diffuse but somewhat worse in left lower quadrant.  States she has noticed dark stool recently as well.  Patient does states she takes warfarin for atrial fibrillation.  Denies any chest pain or shortness of breath.  Past Medical History:  Diagnosis Date  . Anxiety   . Anxiety   . Arthritis   . Breast cancer ([place]) 2014   bilateral invasive mammary carcinoma. with 36 rad tx.   . Breast cancer ([place])   . Cancer ([place])    breast  . Depression   . Depression   . Hard of hearing   . Hyperlipemia   . Hypertension   . IBS (irritable bowel syndrome)   . Memory loss   . Osteoporosis   . Personal history of radiation therapy   . Wears dentures    full upper, partial lower    Patient Active Problem List   Diagnosis Date Noted  . History of IBS 09/26/2018  . Long term (current) use of anticoagulants 07/17/2018  . 3-vessel CAD 07/08/2018  . Chronic combined systolic and diastolic CHF (congestive heart failure) ([place]) 07/08/2018  . Ischemic cardiomyopathy 07/08/2018  . AKI (acute kidney injury) ([place]) 06/06/2018  . Hyperkalemia 06/05/2018  . Atrial fibrillation with RVR ([place]) 05/21/2018  . Microcytic anemia 05/21/2018  . NSTEMI (non-ST elevated myocardial infarction) ([place]) 05/19/2018  . Acute on chronic systolic  heart failure ([place]) 07/30/2017  . LBBB (left bundle branch block) 07/10/2017  . Lumbar radiculopathy 05/16/2017  . Hx of low back pain 05/14/2017  . Ataxia 12/31/2016  . Memory loss 12/31/2016  . Recurrent major depressive disorder, in partial remission ([place]) 12/31/2016  . Bipolar 1 disorder, mixed, moderate ([place]) 12/25/2016  . Chronic bilateral low back pain with bilateral sciatica 10/18/2016  . Bilateral breast cancer ([place]) 02/20/2016  . 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 Cerebrovascular disease 02/16/2015  . Anxiety 02/16/2015  . Breast lump 02/16/2015  . 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[place]) 02/16/2015  . 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 Routine general medical examination at a health care facility 02/16/2015  . Recurrent major depressive episodes ([place]) 02/16/2015  . Below normal amount of sodium in the blood 02/16/2015  . Pre-operative cardiovascular examination 02/16/2015    Past Surgical History:  Procedure Laterality Date  . APPENDECTOMY    . BREAST BIOPSY Bilateral 2014  . BREAST LUMPECTOMY Bilateral 05/2013  . BREAST SURGERY Bilateral   . CATARACT EXTRACTION W/PHACO Right 12/11/2017   Procedure: CATARACT EXTRACTION PHACO AND INTRAOCULAR LENS PLACEMENT ([place]) COMPLICATED RIGHT;  Surgeon: Leandrew Koyanagi, MD;  Location: [facility];  Service: Ophthalmology;  Laterality: Right;  . COLON SURGERY     12 in. removed due to polyps  . LEFT HEART CATH AND CORONARY ANGIOGRAPHY N/A 05/20/2018   Procedure: LEFT HEART CATH AND CORONARY ANGIOGRAPHY;  Surgeon: Corey Skains, MD;  Location: [facility]  INVASIVE CV LAB;  Service: Cardiovascular;  Laterality: N/A;  . TUBAL LIGATION      Prior to Admission medications   Medication Sig Start Date End Date Taking? Authorizing Provider  atorvastatin (LIPITOR) 80 MG tablet Take 80 mg by mouth daily.    [provider]  buPROPion (WELLBUTRIN) 100 MG tablet Take 1 tablet (100 mg total) by mouth 2  (two) times daily. 11/13/19   Juline Patch, MD  doxycycline (VIBRA-TABS) 100 MG tablet Take 1 tablet (100 mg total) by mouth 2 (two) times daily. 02/22/20   Juline Patch, MD  gabapentin (NEURONTIN) 300 MG capsule Take 300 mg by mouth 2 (two) times daily. neurology 10/11/16   [provider]  metoprolol succinate (TOPROL-XL) 25 MG 24 hr tablet Take 25 mg by mouth daily.    [provider]  nitroGLYCERIN (NITROSTAT) 0.4 MG SL tablet Place 0.4 mg under the tongue every 5 (five) minutes as needed for chest pain.    [provider]  omeprazole (PRILOSEC) 40 MG capsule Take 1 capsule (40 mg total) by mouth 2 (two) times daily. 11/13/19   Juline Patch, MD  saccharomyces boulardii (FLORASTOR) 250 MG capsule Take 250 mg by mouth daily.     [provider]  sacubitril-valsartan (ENTRESTO) 97-103 MG Take 0.5 tablets by mouth 2 (two) times daily. Patient taking differently: Take 0.5 tablets by mouth 2 (two) times daily. Dr Domingo Cocking 05/07/19   Juline Patch, MD  torsemide (DEMADEX) 10 MG tablet Take 1 tablet by mouth daily. 01/01/19   [provider]  warfarin (COUMADIN) 1 MG tablet  01/01/19   [provider]    Allergies  Allergen Reactions  . Iodine Nausea Only    Betadine okay   . Erythromycin Rash  . Penicillins Rash    Has patient had a PCN reaction causing immediate rash, facial/tongue/throat swelling, SOB or lightheadedness with hypotension: No Has patient had a PCN reaction causing severe rash involving mucus membranes or skin necrosis: No Has patient had a PCN reaction that required hospitalization: No Has patient had a PCN reaction occurring within the last 10 years: No If all of the above answers are "NO", then may proceed with Cephalosporin use.   . Shellfish Allergy Rash    Family History  Problem Relation Age of Onset  . Breast cancer Daughter 80  . Breast cancer Daughter 22  . Cirrhosis Mother   . Lung cancer Father      Social History Social History   Tobacco Use  . Smoking status: Former Smoker    Packs/day: 1.00    Years: 60.00    Pack years: 60.00    Types: Cigarettes    Quit date: 12/2017    Years since quitting: 2.2  . Smokeless tobacco: Never Used  . Tobacco comment: smoking cessation materials not required  Vaping Use  . Vaping Use: Never used  Substance Use Topics  . Alcohol use: Not Currently    Alcohol/week: 0.0 standard drinks  . Drug use: No    Review of Systems Constitutional: Negative for fever. Cardiovascular: Negative for chest pain. Respiratory: Negative for shortness of breath. Gastrointestinal: Positive for vague abdominal pain, diarrhea and occasional dark stool. Genitourinary: Negative for urinary compaints Musculoskeletal: Negative for musculoskeletal complaints Neurological: Negative for headache All other ROS negative  ____________________________________________   PHYSICAL EXAM:  VITAL SIGNS: ED Triage Vitals  Enc Vitals Group     BP --      Pulse Rate  03/04/20 1340 (!) 59     Resp 03/04/20 1340 14     Temp --      Temp src --      SpO2 03/04/20 1340 100 %     Weight 03/04/20 1316 163 lb 12.8 oz (74.3 kg)     Height 03/04/20 1316 5\' 7"  (1.702 m)     Head Circumference --      Peak Flow --      Pain Score 03/04/20 1316 3     Pain Loc --      Pain Edu? --      Excl. in [place]? --    Constitutional: Alert and oriented. Well appearing and in no distress. Eyes: Normal exam ENT      Head: Normocephalic and atraumatic.      Mouth/Throat: Mucous membranes are moist. Cardiovascular: Normal rate, regular rhythm. Respiratory: Normal respiratory effort without tachypnea nor retractions. Breath sounds are clear Gastrointestinal: Soft, moderate lower abdominal tenderness palpation especially in left lower quadrant.  No rebound or guarding.  No distention. Musculoskeletal: Nontender with normal range of motion in all extremities.  Neurologic:  Normal speech  and language. No gross focal neurologic deficits  Skin:  Skin is warm, dry and intact.  Psychiatric: Mood and affect are normal. ____________________________________________  RADIOLOGY  CT pending  ____________________________________________   INITIAL IMPRESSION / ASSESSMENT AND PLAN / ED COURSE  Pertinent labs & imaging results that were available during my care of the patient were reviewed by me and considered in my medical decision making (see chart for details).   Patient presents to the emergency department for abdominal pain and dark stool.  Differential would include colitis, diverticulitis, IBS exacerbation, GI bleed.  Patient's rectal exam shows dark brown stool that is slightly guaiac positive.  No gross blood.  Patient does have hemorrhoids with mild erythema and skin breakdown of her left buttocks likely due to loose stool.  We will check labs and obtain a CT scan abdomen/pelvis to further evaluate.  Patient agreeable to plan of care.  Veronica Ellis was evaluated in Emergency Department on 03/04/2020 for the symptoms described in the history of present illness. She was evaluated in the context of the global COVID-19 pandemic, which necessitated consideration that the patient might be at risk for infection with the SARS-CoV-2 virus that causes COVID-19. Institutional protocols and algorithms that pertain to the evaluation of patients at risk for COVID-19 are in a state of rapid change based on information released by regulatory bodies including the CDC and federal and state organizations. These policies and algorithms were followed during the patient's care in the ED.  ____________________________________________   FINAL CLINICAL IMPRESSION(S) / ED DIAGNOSES  Abdominal pain Dark stool   Harvest Dark, MD 03/04/20 1457

## 2020-03-04 NOTE — ED Triage Notes (Addendum)
Pt states that she has a hx of ibs and missed her medicine for that, states that she then started with diarrhea and now it Is dark in color, pt also states that she started with cold s/s a week ago and was placed on an antibiotic. Pt reports her bottom is also raw from wearing diapers. Pt also states that she is weak and dizzy

## 2020-03-04 NOTE — ED Notes (Signed)
Pt had loose BM mix with urine sample in bedpan. Unable to send sample. Peri care provided and bed pad & briefs changed.

## 2020-03-04 NOTE — ED Notes (Signed)
Pt assisted up to bedside toilet by Mickel Baas NT. Unable to collect urine sample as when pt sat down she accidentally had loose BM in hat as well.

## 2020-03-28 DIAGNOSIS — Z8673 Personal history of transient ischemic attack (TIA), and cerebral infarction without residual deficits: Secondary | ICD-10-CM | POA: Diagnosis not present

## 2020-03-28 DIAGNOSIS — I5042 Chronic combined systolic (congestive) and diastolic (congestive) heart failure: Secondary | ICD-10-CM | POA: Diagnosis not present

## 2020-03-28 DIAGNOSIS — Z7901 Long term (current) use of anticoagulants: Secondary | ICD-10-CM | POA: Diagnosis not present

## 2020-03-28 DIAGNOSIS — K219 Gastro-esophageal reflux disease without esophagitis: Secondary | ICD-10-CM | POA: Diagnosis not present

## 2020-03-28 DIAGNOSIS — I4891 Unspecified atrial fibrillation: Secondary | ICD-10-CM | POA: Diagnosis not present

## 2020-03-28 DIAGNOSIS — I480 Paroxysmal atrial fibrillation: Secondary | ICD-10-CM | POA: Diagnosis not present

## 2020-03-28 DIAGNOSIS — I255 Ischemic cardiomyopathy: Secondary | ICD-10-CM | POA: Diagnosis not present

## 2020-03-28 DIAGNOSIS — I447 Left bundle-branch block, unspecified: Secondary | ICD-10-CM | POA: Diagnosis not present

## 2020-03-28 DIAGNOSIS — I429 Cardiomyopathy, unspecified: Secondary | ICD-10-CM | POA: Diagnosis not present

## 2020-03-28 DIAGNOSIS — I2510 Atherosclerotic heart disease of native coronary artery without angina pectoris: Secondary | ICD-10-CM | POA: Diagnosis not present

## 2020-03-28 DIAGNOSIS — E7800 Pure hypercholesterolemia, unspecified: Secondary | ICD-10-CM | POA: Diagnosis not present

## 2020-04-19 ENCOUNTER — Ambulatory Visit: Payer: PPO | Admitting: Gastroenterology

## 2020-04-19 ENCOUNTER — Encounter: Payer: Self-pay | Admitting: *Deleted

## 2020-05-19 ENCOUNTER — Ambulatory Visit (INDEPENDENT_AMBULATORY_CARE_PROVIDER_SITE_OTHER): Payer: PPO | Admitting: Family Medicine

## 2020-05-19 ENCOUNTER — Other Ambulatory Visit: Payer: Self-pay

## 2020-05-19 ENCOUNTER — Encounter: Payer: Self-pay | Admitting: Family Medicine

## 2020-05-19 VITALS — BP 120/70 | HR 64 | Ht 67.0 in | Wt 166.0 lb

## 2020-05-19 DIAGNOSIS — Z23 Encounter for immunization: Secondary | ICD-10-CM | POA: Diagnosis not present

## 2020-05-19 DIAGNOSIS — M5136 Other intervertebral disc degeneration, lumbar region: Secondary | ICD-10-CM | POA: Diagnosis not present

## 2020-05-19 DIAGNOSIS — F3341 Major depressive disorder, recurrent, in partial remission: Secondary | ICD-10-CM | POA: Diagnosis not present

## 2020-05-19 DIAGNOSIS — I4891 Unspecified atrial fibrillation: Secondary | ICD-10-CM | POA: Diagnosis not present

## 2020-05-19 DIAGNOSIS — E875 Hyperkalemia: Secondary | ICD-10-CM | POA: Diagnosis not present

## 2020-05-19 DIAGNOSIS — F419 Anxiety disorder, unspecified: Secondary | ICD-10-CM

## 2020-05-19 DIAGNOSIS — K219 Gastro-esophageal reflux disease without esophagitis: Secondary | ICD-10-CM | POA: Diagnosis not present

## 2020-05-19 DIAGNOSIS — E7849 Other hyperlipidemia: Secondary | ICD-10-CM | POA: Diagnosis not present

## 2020-05-19 DIAGNOSIS — K922 Gastrointestinal hemorrhage, unspecified: Secondary | ICD-10-CM

## 2020-05-19 DIAGNOSIS — Z7901 Long term (current) use of anticoagulants: Secondary | ICD-10-CM | POA: Diagnosis not present

## 2020-05-19 MED ORDER — BUPROPION HCL 100 MG PO TABS: 100.0000 mg | ORAL_TABLET | Freq: Two times a day (BID) | ORAL | 1 refills | Status: DC

## 2020-05-19 MED ORDER — ATORVASTATIN CALCIUM 80 MG PO TABS: 80.0000 mg | ORAL_TABLET | Freq: Every day | ORAL | 1 refills | Status: DC

## 2020-05-19 MED ORDER — OMEPRAZOLE 40 MG PO CPDR: 40.0000 mg | DELAYED_RELEASE_CAPSULE | Freq: Two times a day (BID) | ORAL | 1 refills | Status: DC

## 2020-05-19 NOTE — Progress Notes (Signed)
Date:  05/19/2020   Name:  Veronica Ellis   DOB:  1933-09-16   MRN:  [id number]   Chief Complaint: Gastroesophageal Reflux, Depression, and change in bowel habits  Gastroesophageal Reflux She reports no abdominal pain, no belching, no chest pain, no choking, no coughing, no dysphagia, no early satiety, no globus sensation, no heartburn, no hoarse voice, no nausea, no sore throat, no stridor, no tooth decay, no water brash or no wheezing. This is a chronic problem. The current episode started more than 1 year ago. The problem occurs occasionally. The problem has been gradually improving. The symptoms are aggravated by certain foods. Pertinent negatives include no anemia, fatigue, melena, muscle weakness, orthopnea or weight loss. She has tried a PPI for the symptoms. The treatment provided moderate relief.  Depression        This is a chronic problem.  The current episode started more than 1 year ago.   The problem occurs intermittently.  The problem has been gradually improving since onset.  Associated symptoms include no decreased concentration, no fatigue, no helplessness, no hopelessness, does not have insomnia, not irritable, no restlessness, no decreased interest, no appetite change, no body aches, no myalgias, no headaches, no indigestion, not sad and no suicidal ideas.  Past treatments include SSRIs - Selective serotonin reuptake inhibitors.  Compliance with treatment is good.  Past medical history includes anxiety.   Anxiety Presents for follow-up visit. Symptoms include excessive worry, irritability and nervous/anxious behavior. Patient reports no chest pain, compulsions, confusion, decreased concentration, depressed mood, dizziness, dry mouth, feeling of choking, hyperventilation, impotence, insomnia, malaise, muscle tension, nausea, obsessions, palpitations, panic, restlessness, shortness of breath or suicidal ideas. Symptoms occur most days.      Lab Results  Component Value Date     CREATININE 1.29 (H) 03/04/2020   BUN 30 (H) 03/04/2020   NA 140 03/04/2020   K 4.3 03/04/2020   CL 107 03/04/2020   CO2 23 03/04/2020   Lab Results  Component Value Date   CHOL 189 05/20/2018   HDL 52 05/20/2018   LDLCALC 113 (H) 05/20/2018   TRIG 119 05/20/2018   CHOLHDL 3.6 05/20/2018   Lab Results  Component Value Date   TSH 5.789 (H) 05/19/2018   Lab Results  Component Value Date   HGBA1C 5.4 05/19/2018   Lab Results  Component Value Date   WBC 8.9 03/04/2020   HGB 12.6 03/04/2020   HCT 38.1 03/04/2020   MCV 89.9 03/04/2020   PLT 284 03/04/2020   Lab Results  Component Value Date   ALT 27 03/04/2020   AST 22 03/04/2020   ALKPHOS 73 03/04/2020   BILITOT 1.3 (H) 03/04/2020     Review of Systems  Constitutional: Positive for irritability. Negative for appetite change, chills, fatigue, fever, unexpected weight change and weight loss.  HENT: Negative for congestion, ear discharge, ear pain, hoarse voice, rhinorrhea, sinus pressure, sneezing and sore throat.   Eyes: Negative for photophobia, pain, discharge, redness and itching.  Respiratory: Negative for cough, choking, shortness of breath, wheezing and stridor.   Cardiovascular: Negative for chest pain and palpitations.  Gastrointestinal: Negative for abdominal pain, blood in stool, constipation, diarrhea, dysphagia, heartburn, melena, nausea and vomiting.  Endocrine: Negative for cold intolerance, heat intolerance, polydipsia, polyphagia and polyuria.  Genitourinary: Negative for dysuria, flank pain, frequency, hematuria, impotence, menstrual problem, pelvic pain, urgency, vaginal bleeding and vaginal discharge.  Musculoskeletal: Negative for arthralgias, back pain, myalgias and muscle weakness.  Skin: Negative for  rash.  Allergic/Immunologic: Negative for environmental allergies and food allergies.  Neurological: Negative for dizziness, weakness, light-headedness, numbness and headaches.  Hematological:  Negative for adenopathy. Does not bruise/bleed easily.  Psychiatric/Behavioral: Positive for depression. Negative for confusion, decreased concentration, dysphoric mood and suicidal ideas. The patient is nervous/anxious. The patient does not have insomnia.     Patient Active Problem List   Diagnosis Date Noted  . GERD without esophagitis 08/31/2019  . History of IBS 09/26/2018  . Long term (current) use of anticoagulants 07/17/2018  . 3-vessel CAD 07/08/2018  . Chronic combined systolic and diastolic CHF (congestive heart failure) ([place]) 07/08/2018  . Ischemic cardiomyopathy 07/08/2018  . AKI (acute kidney injury) ([place]) 06/06/2018  . Hyperkalemia 06/05/2018  . Atrial fibrillation with RVR ([place]) 05/21/2018  . Microcytic anemia 05/21/2018  . NSTEMI (non-ST elevated myocardial infarction) ([place]) 05/19/2018  . Acute on chronic systolic heart failure ([place]) 07/30/2017  . LBBB (left bundle branch block) 07/10/2017  . Lumbar radiculopathy 05/16/2017  . Hx of low back pain 05/14/2017  . Ataxia 12/31/2016  . Memory loss 12/31/2016  . Recurrent major depressive disorder, in partial remission ([place]) 12/31/2016  . Bipolar 1 disorder, mixed, moderate ([place]) 12/25/2016  . Chronic bilateral low back pain with bilateral sciatica 10/18/2016  . Bilateral breast cancer ([place]) 02/20/2016  . 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 Cerebrovascular disease 02/16/2015  . Anxiety 02/16/2015  . Breast lump 02/16/2015  . 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[place]) 02/16/2015  . 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 Routine general medical examination at a health care facility 02/16/2015  . Recurrent major depressive episodes ([place]) 02/16/2015  . Below normal amount of sodium in the blood 02/16/2015  . Pre-operative cardiovascular examination 02/16/2015    Allergies  Allergen Reactions  . Iodine Nausea Only    Betadine okay   . Erythromycin Rash  . Other Rash  . Penicillins Rash    Has  patient had a PCN reaction causing immediate rash, facial/tongue/throat swelling, SOB or lightheadedness with hypotension: No Has patient had a PCN reaction causing severe rash involving mucus membranes or skin necrosis: No Has patient had a PCN reaction that required hospitalization: No Has patient had a PCN reaction occurring within the last 10 years: No If all of the above answers are "NO", then may proceed with Cephalosporin use.   Marland Kitchen Shellfish Allergy Rash    Past Surgical History:  Procedure Laterality Date  . APPENDECTOMY    . BREAST BIOPSY Bilateral 2014  . BREAST LUMPECTOMY Bilateral 05/2013  . BREAST SURGERY Bilateral   . CATARACT EXTRACTION W/PHACO Right 12/11/2017   Procedure: CATARACT EXTRACTION PHACO AND INTRAOCULAR LENS PLACEMENT ([place]) COMPLICATED RIGHT;  Surgeon: Leandrew Koyanagi, MD;  Location: [facility];  Service: Ophthalmology;  Laterality: Right;  . COLON SURGERY     12 in. removed due to polyps  . LEFT HEART CATH AND CORONARY ANGIOGRAPHY N/A 05/20/2018   Procedure: LEFT HEART CATH AND CORONARY ANGIOGRAPHY;  Surgeon: Corey Skains, MD;  Location: [facility] CV LAB;  Service: Cardiovascular;  Laterality: N/A;  . TUBAL LIGATION      Social History   Tobacco Use  . Smoking status: Former Smoker    Packs/day: 1.00    Years: 60.00    Pack years: 60.00    Types: Cigarettes    Quit date: 12/2017    Years since quitting: 2.4  . Smokeless tobacco: Never Used  . Tobacco comment: smoking cessation materials not required  Vaping Use  .  Vaping Use: Never used  Substance Use Topics  . Alcohol use: Not Currently    Alcohol/week: 0.0 standard drinks  . Drug use: No     Medication list has been reviewed and updated.  Current Meds  Medication Sig  . atorvastatin (LIPITOR) 80 MG tablet Take 80 mg by mouth daily.  Marland Kitchen buPROPion (WELLBUTRIN) 100 MG tablet Take 1 tablet (100 mg total) by mouth 2 (two) times daily.  . metoprolol succinate (TOPROL-XL) 25  MG 24 hr tablet Take 25 mg by mouth daily.  . nitroGLYCERIN (NITROSTAT) 0.4 MG SL tablet Place 0.4 mg under the tongue every 5 (five) minutes as needed for chest pain.  Marland Kitchen omeprazole (PRILOSEC) 40 MG capsule Take 1 capsule (40 mg total) by mouth 2 (two) times daily.  Marland Kitchen saccharomyces boulardii (FLORASTOR) 250 MG capsule Take 250 mg by mouth daily.   . sacubitril-valsartan (ENTRESTO) 97-103 MG Take 0.5 tablets by mouth 2 (two) times daily. (Patient taking differently: Take 1 tablet by mouth 2 (two) times daily. )  . spironolactone (ALDACTONE) 25 MG tablet Take 25 mg by mouth daily.  Marland Kitchen torsemide (DEMADEX) 10 MG tablet Take 10 mg by mouth daily.   Marland Kitchen warfarin (COUMADIN) 3 MG tablet Take 3 mg by mouth See admin instructions. Take 1 tablet (3mg ) by mouth every Tuesday and Thursday  . warfarin (COUMADIN) 4 MG tablet Take 4 mg by mouth See admin instructions. Take 1 tablet (4mg ) by mouth every Monday, Wednesday, Friday, Saturday and Sunday    [facility] 2/9 Scores 05/19/2020 01/21/2020 11/13/2019 10/05/2019  PHQ - 2 Score 0 0 0 0  PHQ- 9 Score 0 0 5 0    GAD 7 : Generalized Anxiety Score 05/19/2020 01/21/2020 11/13/2019 10/05/2019  Nervous, Anxious, on Edge 1 0 1 0  Control/stop worrying 1 0 2 0  Worry too much - different things 1 0 2 0  Trouble relaxing 0 0 1 0  Restless 0 0 0 0  Easily annoyed or irritable 0 0 0 0  Afraid - awful might happen 0 0 0 0  Total GAD 7 Score 3 0 6 0  Anxiety Difficulty Not difficult at all - Not difficult at all -    BP Readings from Last 3 Encounters:  05/19/20 120/70  03/04/20 (!) 121/48  02/22/20 100/64    Physical Exam Vitals and nursing note reviewed.  Constitutional:      General: She is not irritable.    Appearance: She is well-developed.  HENT:     Head: Normocephalic.     Right Ear: Tympanic membrane, ear canal and external ear normal.     Left Ear: Tympanic membrane, ear canal and external ear normal.  Eyes:     General: Lids are everted, no foreign bodies  appreciated. No scleral icterus.       Left eye: No foreign body or hordeolum.     Conjunctiva/sclera: Conjunctivae normal.     Right eye: Right conjunctiva is not injected.     Left eye: Left conjunctiva is not injected.     Pupils: Pupils are equal, round, and reactive to light.  Neck:     Thyroid: No thyromegaly.     Vascular: No JVD.     Trachea: No tracheal deviation.  Cardiovascular:     Rate and Rhythm: Normal rate and regular rhythm.     Heart sounds: Normal heart sounds. No murmur heard.  No friction rub. No gallop.   Pulmonary:     Effort: Pulmonary  effort is normal. No respiratory distress.     Breath sounds: Normal breath sounds. No wheezing or rales.  Abdominal:     General: Bowel sounds are normal.     Palpations: Abdomen is soft. There is no mass.     Tenderness: There is no abdominal tenderness. There is no guarding or rebound.  Musculoskeletal:        General: No tenderness. Normal range of motion.     Cervical back: Normal range of motion and neck supple.  Lymphadenopathy:     Cervical: No cervical adenopathy.  Skin:    General: Skin is warm.     Findings: No rash.  Neurological:     Mental Status: She is alert and oriented to person, place, and time.     Cranial Nerves: No cranial nerve deficit.     Deep Tendon Reflexes: Reflexes normal.  Psychiatric:        Mood and Affect: Mood is not anxious or depressed.     Wt Readings from Last 3 Encounters:  05/19/20 166 lb (75.3 kg)  03/04/20 163 lb 12.8 oz (74.3 kg)  02/22/20 164 lb (74.4 kg)    BP 120/70   Pulse 64   Ht 5\' 7"  (1.702 m)   Wt 166 lb (75.3 kg)   BMI 26.00 kg/m   Assessment and Plan: 1. Recurrent major depressive disorder, in partial remission (HCC) Chronic.  Controlled.  Stable.  PHQ 0.  Continue on bupropion 100 mg twice a day. - buPROPion (WELLBUTRIN) 100 MG tablet; Take 1 tablet (100 mg total) by mouth 2 (two) times daily.  Dispense: 180 tablet; Refill: 1  2. DDD (degenerative disc  disease), lumbar Chronic.  Controlled.  Stable.  Patient was evaluated by Dr. Phyllis Ginger per referral of Dr. Melrose Nakayama in 2019.  Patient has been using gabapentin apparently on an as-needed basis but is running low from that 2019 and would like to be reevaluated and if necessary determine if this would be sufficient to continue.  We will refer to Reche Dixon for maintenance reevaluation and determination further tests and/or continuance of medication.  Patient has been on gabapentin 100 mg twice a day and we have been careful with NSAIDs given her past history of GI bleed. - Ambulatory referral to Orthopedic Surgery  3. Familial multiple lipoprotein-type hyperlipidemia Chronic.  Controlled.  Stable.  Continue atorvastatin 80 mg once a day.  Will check renal panel today. - atorvastatin (LIPITOR) 80 MG tablet; Take 1 tablet (80 mg total) by mouth daily.  Dispense: 90 tablet; Refill: 1 - Lipid Panel With LDL/HDL Ratio  4. GERD without esophagitis Chronic.  Controlled.  Stable.  Continue Prilosec 1 twice a day. - omeprazole (PRILOSEC) 40 MG capsule; Take 1 capsule (40 mg total) by mouth 2 (two) times daily.  Dispense: 180 capsule; Refill: 1  5. Acute upper GI bleed Chronic.  This is a history and acute upper GI bleed presumably secondary to NSAID.  Patient is doing well on current therapy of omeprazole 40 mg twice a day. - omeprazole (PRILOSEC) 40 MG capsule; Take 1 capsule (40 mg total) by mouth 2 (two) times daily.  Dispense: 180 capsule; Refill: 1  6. Anxiety Chronic.  Controlled.  Stable.  Gad score is 0.  We will continue Wellbutrin which is relatively controlled her anxiety.  7. Hyperkalemia Chronic.  Controlled.  Stable.  Will check renal function panel for electrolytes and GFR concerns. - Renal Function Panel  8. Need for immunization against influenza Discussed  and administered. - Flu Vaccine QUAD High Dose(Fluad)

## 2020-05-20 LAB — RENAL FUNCTION PANEL
Albumin: 3.9 g/dL (ref 3.6–4.6)
BUN/Creatinine Ratio: 16 (ref 12–28)
BUN: 18 mg/dL (ref 8–27)
CO2: 25 mmol/L (ref 20–29)
Calcium: 9.1 mg/dL (ref 8.7–10.3)
Chloride: 101 mmol/L (ref 96–106)
Creatinine, Ser: 1.15 mg/dL — ABNORMAL HIGH (ref 0.57–1.00)
GFR calc Af Amer: 50 mL/min/{1.73_m2} — ABNORMAL LOW (ref >59)
GFR calc non Af Amer: 44 mL/min/{1.73_m2} — ABNORMAL LOW (ref >59)
Glucose: 87 mg/dL (ref 65–99)
Phosphorus: 4 mg/dL (ref 3.0–4.3)
Potassium: 4.9 mmol/L (ref 3.5–5.2)
Sodium: 138 mmol/L (ref 134–144)

## 2020-05-20 LAB — LIPID PANEL WITH LDL/HDL RATIO
Cholesterol, Total: 129 mg/dL (ref 100–199)
HDL: 52 mg/dL (ref >39)
LDL Chol Calc (NIH): 56 mg/dL (ref 0–99)
LDL/HDL Ratio: 1.1 ratio (ref 0.0–3.2)
Triglycerides: 120 mg/dL (ref 0–149)
VLDL Cholesterol Cal: 21 mg/dL (ref 5–40)

## 2020-06-23 DIAGNOSIS — Z7901 Long term (current) use of anticoagulants: Secondary | ICD-10-CM | POA: Diagnosis not present

## 2020-06-23 DIAGNOSIS — I4891 Unspecified atrial fibrillation: Secondary | ICD-10-CM | POA: Diagnosis not present

## 2020-06-24 DIAGNOSIS — I4891 Unspecified atrial fibrillation: Secondary | ICD-10-CM | POA: Diagnosis not present

## 2020-06-24 DIAGNOSIS — Z7901 Long term (current) use of anticoagulants: Secondary | ICD-10-CM | POA: Diagnosis not present

## 2020-06-24 DIAGNOSIS — Z8673 Personal history of transient ischemic attack (TIA), and cerebral infarction without residual deficits: Secondary | ICD-10-CM | POA: Diagnosis not present

## 2020-06-24 DIAGNOSIS — I480 Paroxysmal atrial fibrillation: Secondary | ICD-10-CM | POA: Diagnosis not present

## 2020-06-30 ENCOUNTER — Ambulatory Visit: Payer: PPO

## 2020-07-01 ENCOUNTER — Ambulatory Visit: Payer: PPO | Admitting: Oncology

## 2020-07-04 ENCOUNTER — Other Ambulatory Visit: Payer: Self-pay

## 2020-07-04 ENCOUNTER — Ambulatory Visit
Admission: RE | Admit: 2020-07-04 | Discharge: 2020-07-04 | Disposition: A | Discharge status: Home / Self Care | Payer: PPO | Source: Ambulatory Visit | Attending: Oncology | Admitting: Oncology

## 2020-07-04 DIAGNOSIS — Z853 Personal history of malignant neoplasm of breast: Secondary | ICD-10-CM | POA: Diagnosis not present

## 2020-07-04 DIAGNOSIS — I2510 Atherosclerotic heart disease of native coronary artery without angina pectoris: Secondary | ICD-10-CM | POA: Diagnosis not present

## 2020-07-04 DIAGNOSIS — J479 Bronchiectasis, uncomplicated: Secondary | ICD-10-CM | POA: Diagnosis not present

## 2020-07-04 DIAGNOSIS — R911 Solitary pulmonary nodule: Secondary | ICD-10-CM | POA: Insufficient documentation

## 2020-07-04 DIAGNOSIS — J439 Emphysema, unspecified: Secondary | ICD-10-CM | POA: Diagnosis not present

## 2020-07-05 ENCOUNTER — Inpatient Hospital Stay: Payer: PPO | Attending: Oncology | Admitting: Oncology

## 2020-07-05 DIAGNOSIS — I4891 Unspecified atrial fibrillation: Secondary | ICD-10-CM | POA: Diagnosis not present

## 2020-07-05 DIAGNOSIS — R911 Solitary pulmonary nodule: Secondary | ICD-10-CM

## 2020-07-05 DIAGNOSIS — Z801 Family history of malignant neoplasm of trachea, bronchus and lung: Secondary | ICD-10-CM | POA: Insufficient documentation

## 2020-07-05 DIAGNOSIS — F32A Depression, unspecified: Secondary | ICD-10-CM | POA: Diagnosis not present

## 2020-07-05 DIAGNOSIS — Z803 Family history of malignant neoplasm of breast: Secondary | ICD-10-CM | POA: Diagnosis not present

## 2020-07-05 DIAGNOSIS — Z7901 Long term (current) use of anticoagulants: Secondary | ICD-10-CM | POA: Insufficient documentation

## 2020-07-05 DIAGNOSIS — R918 Other nonspecific abnormal finding of lung field: Secondary | ICD-10-CM | POA: Diagnosis not present

## 2020-07-05 DIAGNOSIS — Z87891 Personal history of nicotine dependence: Secondary | ICD-10-CM | POA: Diagnosis not present

## 2020-07-05 DIAGNOSIS — I110 Hypertensive heart disease with heart failure: Secondary | ICD-10-CM | POA: Diagnosis not present

## 2020-07-05 NOTE — Progress Notes (Signed)
Pulmonary Nodule Clinic Consult note [facility]  [phone] [phone]  [name], MD as PCP - General (Family Medicine) Lloyd Huger, MD as Consulting Physician (Oncology) Minor, Dalbert Garnet, RN (Inactive) as [facility] Management   Name of the patient: Veronica Ellis  [id number]  [birth year]   Date of visit: 07/05/2020   Diagnosis- Lung Nodule  Chief complaint/ Reason for visit- Pulmonary Nodule Clinic Initial Visit  Past Medical History:  Patient is managed/referred by Dr. Ronnald Ramp for lung nodules.   Patient was evaluated for shortness of breath and tachycardia and subsequently admitted for non-STEMI with acute respiratory failure and hypoxia due to chronic systolic CHF on 10/17/35.  Imaging revealed peripheral septal thickening and basilar groundglass opacities consistent with pulmonary edema.  Subpleural scarring in anterior lungs likely postradiation change 5 mm nodule in superior segment of left lower lobe that remained unchanged from prior.  Subpleural nodularity in the anterior right middle lobe appearing slightly more prominent than on prior exam.  Some additional nodules on prior exam are obscured.  Follow-up was recommended.  CT chest without contrast from 06/30/19 revealed stable radiation changes involving the anterior aspect of the left lung and stable scattered pulmonary nodules and right middle lobe and lingular scarring changes.  Interval decrease in size of mediastinal lymph nodes.  Scattered pulmonary nodules noted 6.5 mm right middle lobe nodule, 4 mm superior segment left lower lobe nodule and nodular density in right middle lobe.  Given her smoking history and size of lung nodule (6.5 mm) I would recommend a 61-month follow-up for a noncontrast CT.  Interval history-She presents today to review results of her recent CT scan.  States she has been doing well.  She is living at her  son's house.  Per chart review she has past medical history positive for: Past Medical History:  Diagnosis Date  . Anxiety   . Anxiety   . Arthritis   . Breast cancer ([place]) 2014   bilateral invasive mammary carcinoma. with 36 rad tx.   . Breast cancer ([place])   . Cancer ([place])    breast  . Depression   . Depression   . Hard of hearing   . Hyperlipemia   . Hypertension   . IBS (irritable bowel syndrome)   . Memory loss   . Osteoporosis   . Personal history of radiation therapy   . Wears dentures    full upper, partial lower    She has a past surgical history positive for: Past Surgical History:  Procedure Laterality Date  . APPENDECTOMY    . BREAST BIOPSY Bilateral 2014  . BREAST LUMPECTOMY Bilateral 05/2013  . BREAST SURGERY Bilateral   . CATARACT EXTRACTION W/PHACO Right 12/11/2017   Procedure: CATARACT EXTRACTION PHACO AND INTRAOCULAR LENS PLACEMENT ([place]) COMPLICATED RIGHT;  Surgeon: Leandrew Koyanagi, MD;  Location: [facility];  Service: Ophthalmology;  Laterality: Right;  . COLON SURGERY     12 in. removed due to polyps  . LEFT HEART CATH AND CORONARY ANGIOGRAPHY N/A 05/20/2018   Procedure: LEFT HEART CATH AND CORONARY ANGIOGRAPHY;  Surgeon: Corey Skains, MD;  Location: [place] [facility];  Service: Cardiovascular;  Laterality: N/A;  . TUBAL LIGATION     She is currently not smoking.  She quit 2 years ago per her son.  60-pack-year history.  Patient cannot remember quit date.  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  She denies any known exposures to cancer causing agents.  Has past family history positive for lung cancer, breast cancer. Most of her family has smoked.   In interim she has been doing well.  She had follow-up Dr. Grayland Ormond for breast cancer on 10/01/2019 and they reviewed her most recent mammogram which reported BI-RADS 2.  She was scheduled to repeat in January 2022.  She is currently not on letrozole.  She was seen by  her PCP Dr. Ronnald Ramp on 10/05/2019 for a tooth abscess and given a prescription for doxycycline.  She was evaluated on 11/13/2019 for GERD and depression.  She was instructed to continue her Wellbutrin and omeprazole as prescribed.  She was seen on 02/22/2020 for cough and possible GI bleed and diagnosed with sinusitis.  She was started on doxycycline for 10 days.  She was also sent home with guaiac cards which were positive.  She was referred to GI.  She presented to the emergency room on 03/04/2020 for possible GI bleed.  She also complained of abdominal pain so imaging was obtained which showed cholelithiasis without evidence of acute cholecystitis and stable mild to moderate severity right sided hydronephrosis and hydroureter.   She was seen by her cardiologist Dr. Tommi Rumps on 03/28/2020 for follow-up of her cardiomyopathy and atrial fibrillation.  She appeared to be doing well.  She continued on Coumadin.  They continued her medications for combined systolic and diastolic heart failure.  Had a cardiac MRI that revealed her left ventricular ejection fraction had increased from 25% to 50% in approximately 1 year.  Given the significant improvement, she did not require a cardioverter or defibrillator at this time.  She complains of occasional chest pain with physical exertion and acid reflux.  Otherwise she is doing great. She currently denies any neurological complaints fevers or illness.  She denies any easy bleeding or bruising, good appetite and denies any weight loss.  She denies any chest pain, nausea, vomiting constipation or diarrhea.  She denies any urinary complaints.  She has occasional shortness of breath with exertion but otherwise does well.She has a history of COPD but does not wear oxygen.  She is currently on Wellbutrin 100 mg 1 tablet twice a day.   ECOG FS:1 - Symptomatic but completely ambulatory  Review of systems- Review of Systems  Constitutional: Positive for malaise/fatigue. Negative  for chills, fever and weight loss.  HENT: Negative for congestion, ear pain and tinnitus.   Eyes: Negative.  Negative for blurred vision and double vision.  Respiratory: Positive for cough and shortness of breath. Negative for sputum production.   Cardiovascular: Negative.  Negative for chest pain, palpitations and leg swelling.  Gastrointestinal: Negative.  Negative for abdominal pain, constipation, diarrhea, nausea and vomiting.  Genitourinary: Negative for dysuria, frequency and urgency.  Musculoskeletal: Positive for myalgias. Negative for back pain and falls.  Skin: Negative.  Negative for rash.  Neurological: Negative.  Negative for weakness and headaches.  Endo/Heme/Allergies: Negative.  Does not bruise/bleed easily.  Psychiatric/Behavioral: Negative.  Negative for depression. The patient is not nervous/anxious and does not have insomnia.      Allergies  Allergen Reactions  . Iodine Nausea Only    Betadine okay   . Erythromycin Rash  . Other Rash  . Penicillins Rash    Has patient had a PCN reaction causing immediate rash, facial/tongue/throat swelling, SOB or lightheadedness with hypotension: No Has patient had a PCN reaction causing severe rash involving mucus membranes or skin necrosis: No Has patient  had a PCN reaction that required hospitalization: No Has patient had a PCN reaction occurring within the last 10 years: No If all of the above answers are "NO", then may proceed with Cephalosporin use.   Marland Kitchen Shellfish Allergy Rash     Past Medical History:  Diagnosis Date  . Anxiety   . Anxiety   . Arthritis   . Breast cancer ([place]) 2014   bilateral invasive mammary carcinoma. with 36 rad tx.   . Breast cancer ([place])   . Cancer ([place])    breast  . Depression   . Depression   . Hard of hearing   . Hyperlipemia   . Hypertension   . IBS (irritable bowel syndrome)   . Memory loss   . Osteoporosis   . Personal history of radiation therapy   . Wears dentures    full  upper, partial lower     Past Surgical History:  Procedure Laterality Date  . APPENDECTOMY    . BREAST BIOPSY Bilateral 2014  . BREAST LUMPECTOMY Bilateral 05/2013  . BREAST SURGERY Bilateral   . CATARACT EXTRACTION W/PHACO Right 12/11/2017   Procedure: CATARACT EXTRACTION PHACO AND INTRAOCULAR LENS PLACEMENT ([place]) COMPLICATED RIGHT;  Surgeon: Leandrew Koyanagi, MD;  Location: [facility];  Service: Ophthalmology;  Laterality: Right;  . COLON SURGERY     12 in. removed due to polyps  . LEFT HEART CATH AND CORONARY ANGIOGRAPHY N/A 05/20/2018   Procedure: LEFT HEART CATH AND CORONARY ANGIOGRAPHY;  Surgeon: Corey Skains, MD;  Location: [facility];  Service: Cardiovascular;  Laterality: N/A;  . TUBAL LIGATION      Social History   Socioeconomic History  . Marital status: Widowed    Spouse name: Not on file  . Number of children: 5  . Years of education: some college  . Highest education level: 12th grade  Occupational History  . Occupation: Retired  Tobacco Use  . Smoking status: Former Smoker    Packs/day: 1.00    Years: 60.00    Pack years: 60.00    Types: Cigarettes    Quit date: 12/2017    Years since quitting: 2.5  . Smokeless tobacco: Never Used  . Tobacco comment: smoking cessation materials not required  Vaping Use  . Vaping Use: Never used  Substance and Sexual Activity  . Alcohol use: Not Currently    Alcohol/week: 0.0 standard drinks  . Drug use: No  . Sexual activity: Yes  Other Topics Concern  . Not on file  Social History Narrative  . Not on file   Social Determinants of Health   Financial Resource Strain:   . Difficulty of Paying Living Expenses: Not on file  Food Insecurity:   . Worried About Charity fundraiser in the Last Year: Not on file  . Ran Out of Food in the Last Year: Not on file  Transportation Needs:   . Lack of Transportation (Medical): Not on file  . Lack of Transportation (Non-Medical): Not on file    Physical Activity:   . Days of Exercise per Week: Not on file  . Minutes of Exercise per Session: Not on file  Stress:   . Feeling of Stress : Not on file  Social Connections:   . Frequency of Communication with Friends and Family: Not on file  . Frequency of Social Gatherings with Friends and Family: Not on file  . Attends Religious Services: Not on file  . Active Member of Clubs or Organizations:  Not on file  . Attends Archivist Meetings: Not on file  . Marital Status: Not on file  Intimate Partner Violence:   . Fear of Current or Ex-Partner: Not on file  . Emotionally Abused: Not on file  . Physically Abused: Not on file  . Sexually Abused: Not on file    Family History  Problem Relation Age of Onset  . Breast cancer Daughter 53  . Breast cancer Daughter 58  . Cirrhosis Mother   . Lung cancer Father      Current Outpatient Medications:  .  atorvastatin (LIPITOR) 80 MG tablet, Take 1 tablet (80 mg total) by mouth daily., Disp: 90 tablet, Rfl: 1 .  buPROPion (WELLBUTRIN) 100 MG tablet, Take 1 tablet (100 mg total) by mouth 2 (two) times daily., Disp: 180 tablet, Rfl: 1 .  metoprolol succinate (TOPROL-XL) 25 MG 24 hr tablet, Take 25 mg by mouth daily., Disp: , Rfl:  .  nitroGLYCERIN (NITROSTAT) 0.4 MG SL tablet, Place 0.4 mg under the tongue every 5 (five) minutes as needed for chest pain., Disp: , Rfl:  .  omeprazole (PRILOSEC) 40 MG capsule, Take 1 capsule (40 mg total) by mouth 2 (two) times daily., Disp: 180 capsule, Rfl: 1 .  saccharomyces boulardii (FLORASTOR) 250 MG capsule, Take 250 mg by mouth daily. , Disp: , Rfl:  .  sacubitril-valsartan (ENTRESTO) 97-103 MG, Take 0.5 tablets by mouth 2 (two) times daily. (Patient taking differently: Take 1 tablet by mouth 2 (two) times daily. ), Disp: 60 tablet, Rfl: 0 .  spironolactone (ALDACTONE) 25 MG tablet, Take 25 mg by mouth daily., Disp: , Rfl:  .  torsemide (DEMADEX) 10 MG tablet, Take 10 mg by mouth daily. ,  Disp: , Rfl:  .  warfarin (COUMADIN) 3 MG tablet, Take 3 mg by mouth See admin instructions. Take 1 tablet (3mg ) by mouth every Tuesday and Thursday, Disp: , Rfl:  .  warfarin (COUMADIN) 4 MG tablet, Take 4 mg by mouth See admin instructions. Take 1 tablet (4mg ) by mouth every Monday, Wednesday, Friday, Saturday and Sunday, Disp: , Rfl:   Physical exam: There were no vitals filed for this visit. Limited d/t telephone visit  CMP Latest Ref Rng & Units 05/19/2020  Glucose 65 - 99 mg/dL 87  BUN 8 - 27 mg/dL 18  Creatinine 0.57 - 1.00 mg/dL 1.15(H)  Sodium 134 - 144 mmol/L 138  Potassium 3.5 - 5.2 mmol/L 4.9  Chloride 96 - 106 mmol/L 101  CO2 20 - 29 mmol/L 25  Calcium 8.7 - 10.3 mg/dL 9.1  Total Protein 6.5 - 8.1 g/dL -  Total Bilirubin 0.3 - 1.2 mg/dL -  Alkaline Phos 38 - 126 U/L -  AST 15 - 41 U/L -  ALT 0 - 44 U/L -   CBC Latest Ref Rng & Units 03/04/2020  WBC 4.0 - 10.5 K/uL 8.9  Hemoglobin 12.0 - 15.0 g/dL 12.6  Hematocrit 36 - 46 % 38.1  Platelets 150 - 400 K/uL 284    No images are attached to the encounter.  CT Chest Wo Contrast  Result Date: 07/04/2020 CLINICAL DATA:  Follow-up pulmonary nodules. History of bilateral breast cancer status post chemotherapy, surgery and radiation. EXAM: CT CHEST WITHOUT CONTRAST TECHNIQUE: Multidetector CT imaging of the chest was performed following the standard protocol without IV contrast. COMPARISON:  06/30/2019 FINDINGS: Cardiovascular: Heart size is normal. There is no pericardial effusion identified. Aortic atherosclerosis. Coronary artery atherosclerotic calcifications. Mediastinum/Nodes: Normal appearance of the  thyroid gland. The trachea appears patent and is midline. Normal appearance of the esophagus. No enlarged axillary, supraclavicular, mediastinal, or hilar lymph nodes. Lungs/Pleura: Mild changes of emphysema. Subpleural fibrotic changes are noted within the anterior aspect of the left upper lobe, likely reflecting changes due to  external beam radiation. Scattered lung nodules are again noted: -anteromedial right middle lobe lung nodule measures 6 mm, image 87/4. Unchanged. -4 mm nodule in the superior segment of the left lower lobe is stable, image 45/4. -mild peripheral bronchiolectasis with distal mucoid impaction is identified within the right middle lobe and lingula. Upper Abdomen: There are no acute findings within the imaged portions of the upper abdomen. Large calcified gallstone. Aortic atherosclerosis. Subcapsular calcification involving the lateral aspect of the spleen is similar to previous exam and is likely the sequelae of remote trauma. Musculoskeletal: There is no suspicious osseous findings. IMPRESSION: 1. No acute cardiopulmonary abnormalities. 2. Stable appearance of small pulmonary nodules. 3. Mild peripheral bronchiolectasis with distal mucoid impaction is identified within the right middle lobe and lingula. Findings are nonspecific and may reflect sequelae of chronic indolent atypical infection. 4. Coronary artery calcifications noted. 5. Gallstone. 6. Emphysema and aortic atherosclerosis. Aortic Atherosclerosis (ICD10-I70.0) and Emphysema (ICD10-J43.9). Electronically Signed   By: Kerby Moors M.D.   On: 07/04/2020 15:18   Assessment and plan- Patient is a 86 y.o. female who presents to pulmonary nodule clinic for follow-up of incidental lung nodules.   CT chest without contrast from 07/04/2020 showed no acute cardiopulmonary abnormalities.  Stable appearance of small pulmonary nodules.  Mild peripheral bronchial lysis with distal mucoid impaction is identified within the right middle lobe and lingula.  Findings are nonspecific and may reflect sequelae of chronic indolent atypical infection.   Given the stability of her pulmonary nodules, no additional follow-up is needed.   Calculating malignancy probability of a pulmonary nodule: Risk factors include: 1.  Age. 2.  Cancer history. 3.  Diameter of  pulmonary nodule and mm 4.  Location 5.  Smoking history 6.  Spiculation present   Based on risk factors, this patient is High risk for the development of lung cancer.   I recommend 1 additional follow-up in our lung nodule clinic given her high risk for the development of lung cancer.  Patient and son in agreement with plan.  If stable at that time, would defer to PCP Dr. Ronnald Ramp for further imaging.     During our visit, we discussed pulmonary nodules are a common incidental finding and are often how lung cancer is discovered.  Lung cancer survival is directly related to the stage at diagnosis.  We discussed that nodules can vary in presentation from solitary pulmonary nodules to masses, 2 groundglass opacities and multiple nodules.  Pulmonary nodules in the majority of cases are benign but the probability of these becoming malignant cannot be undermined.  Early identification of malignant nodules could lead to early diagnosis and increased survival.   We discussed the probability of pulmonary nodules becoming malignant increase with age, pack years of tobacco use, size/characteristics of the nodule and location; with upper lobe involvement being most worrisome.   We discussed the goal of our clinic is to thoroughly evaluate each nodule, developed a comprehensive, individualized plan of care utilizing the most advanced technology and significantly reduce the time from detection to treatment.  A dedicated pulmonary nodule clinic has proven to indeed expedite the detection and treatment of lung cancer.   Patient education in fact sheet provided along with  most recent CT scans.  Disposition: No follow-up is needed. Will refer back to her primary care provider.  She will continue to follow-up with Dr. Grayland Ormond at the cancer center.   Visit Diagnosis 1. Lung nodule     Patient expressed understanding and was in agreement with this plan. She also understands that She can call clinic at any time  with any questions, concerns, or complaints.   Greater than 50% was spent in counseling and coordination of care with this patient including but not limited to discussion of the relevant topics above (See A&P) including, but not limited to diagnosis and management of acute and chronic medical conditions.   Thank you for allowing me to participate in the care of this very pleasant patient.    Jacquelin Hawking, NP [place] [facility] Cell - [phone] Pager- [phone] 07/05/2020 11:26 AM

## 2020-07-06 ENCOUNTER — Ambulatory Visit (INDEPENDENT_AMBULATORY_CARE_PROVIDER_SITE_OTHER): Payer: PPO

## 2020-07-06 DIAGNOSIS — Z0000 Encounter for general adult medical examination without abnormal findings: Secondary | ICD-10-CM | POA: Diagnosis not present

## 2020-07-06 DIAGNOSIS — Z599 Problem related to housing and economic circumstances, unspecified: Secondary | ICD-10-CM | POA: Diagnosis not present

## 2020-07-06 NOTE — Patient Instructions (Signed)
Veronica Ellis , Thank you for taking time to come for your Medicare Wellness Visit. I appreciate your ongoing commitment to your health goals. Please review the following plan we discussed and let me know if I can assist you in the future.   Screening recommendations/referrals: Colonoscopy: no longer required Mammogram: done 09/28/19 Bone Density: done 09/22/18 Recommended yearly ophthalmology/optometry visit for glaucoma screening and checkup Recommended yearly dental visit for hygiene and checkup  Vaccinations: Influenza vaccine: done 05/19/20 Pneumococcal vaccine: declined Tdap vaccine: due Shingles vaccine: Shingrix discussed. Please contact your pharmacy for coverage information.  Covid-19: please bring your vaccination record with you to your next appt  Advanced directives: Advance directive discussed with you today. Even though you declined this today please call our office should you change your mind and we can give you the proper paperwork for you to fill out.  Conditions/risks identified: Recommend continuing to prevent falls  Next appointment: Follow up in one year for your annual wellness visit    Preventive Care 65 Years and Older, Female Preventive care refers to lifestyle choices and visits with your health care provider that can promote health and wellness. What does preventive care include?  A yearly physical exam. This is also called an annual well check.  Dental exams once or twice a year.  Routine eye exams. Ask your health care provider how often you should have your eyes checked.  Personal lifestyle choices, including:  Daily care of your teeth and gums.  Regular physical activity.  Eating a healthy diet.  Avoiding tobacco and drug use.  Limiting alcohol use.  Practicing safe sex.  Taking low-dose aspirin every day.  Taking vitamin and mineral supplements as recommended by your health care provider. What happens during an annual well check? The  services and screenings done by your health care provider during your annual well check will depend on your age, overall health, lifestyle risk factors, and family history of disease. Counseling  Your health care provider may ask you questions about your:  Alcohol use.  Tobacco use.  Drug use.  Emotional well-being.  Home and relationship well-being.  Sexual activity.  Eating habits.  History of falls.  Memory and ability to understand (cognition).  Work and work Statistician.  Reproductive health. Screening  You may have the following tests or measurements:  Height, weight, and BMI.  Blood pressure.  Lipid and cholesterol levels. These may be checked every 5 years, or more frequently if you are over 34 years old.  Skin check.  Lung cancer screening. You may have this screening every year starting at age 49 if you have a 30-pack-year history of smoking and currently smoke or have quit within the past 15 years.  Fecal occult blood test (FOBT) of the stool. You may have this test every year starting at age 35.  Flexible sigmoidoscopy or colonoscopy. You may have a sigmoidoscopy every 5 years or a colonoscopy every 10 years starting at age 93.  Hepatitis C blood test.  Hepatitis B blood test.  Sexually transmitted disease (STD) testing.  Diabetes screening. This is done by checking your blood sugar (glucose) after you have not eaten for a while (fasting). You may have this done every 1-3 years.  Bone density scan. This is done to screen for osteoporosis. You may have this done starting at age 21.  Mammogram. This may be done every 1-2 years. Talk to your health care provider about how often you should have regular mammograms. Talk with your health  care provider about your test results, treatment options, and if necessary, the need for more tests. Vaccines  Your health care provider may recommend certain vaccines, such as:  Influenza vaccine. This is recommended  every year.  Tetanus, diphtheria, and acellular pertussis (Tdap, Td) vaccine. You may need a Td booster every 10 years.  Zoster vaccine. You may need this after age 1.  Pneumococcal 13-valent conjugate (PCV13) vaccine. One dose is recommended after age 48.  Pneumococcal polysaccharide (PPSV23) vaccine. One dose is recommended after age 21. Talk to your health care provider about which screenings and vaccines you need and how often you need them. This information is not intended to replace advice given to you by your health care provider. Make sure you discuss any questions you have with your health care provider. Document Released: 09/23/2015 Document Revised: 05/16/2016 Document Reviewed: 06/28/2015 Elsevier Interactive Patient Education  2017 [place] Prevention in the Home Falls can cause injuries. They can happen to people of all ages. There are many things you can do to make your home safe and to help prevent falls. What can I do on the outside of my home?  Regularly fix the edges of walkways and driveways and fix any cracks.  Remove anything that might make you trip as you walk through a door, such as a raised step or threshold.  Trim any bushes or trees on the path to your home.  Use bright outdoor lighting.  Clear any walking paths of anything that might make someone trip, such as rocks or tools.  Regularly check to see if handrails are loose or broken. Make sure that both sides of any steps have handrails.  Any raised decks and porches should have guardrails on the edges.  Have any leaves, snow, or ice cleared regularly.  Use sand or salt on walking paths during winter.  Clean up any spills in your garage right away. This includes oil or grease spills. What can I do in the bathroom?  Use night lights.  Install grab bars by the toilet and in the tub and shower. Do not use towel bars as grab bars.  Use non-skid mats or decals in the tub or shower.  If  you need to sit down in the shower, use a plastic, non-slip stool.  Keep the floor dry. Clean up any water that spills on the floor as soon as it happens.  Remove soap buildup in the tub or shower regularly.  Attach bath mats securely with double-sided non-slip rug tape.  Do not have throw rugs and other things on the floor that can make you trip. What can I do in the bedroom?  Use night lights.  Make sure that you have a light by your bed that is easy to reach.  Do not use any sheets or blankets that are too big for your bed. They should not hang down onto the floor.  Have a firm chair that has side arms. You can use this for support while you get dressed.  Do not have throw rugs and other things on the floor that can make you trip. What can I do in the kitchen?  Clean up any spills right away.  Avoid walking on wet floors.  Keep items that you use a lot in easy-to-reach places.  If you need to reach something above you, use a strong step stool that has a grab bar.  Keep electrical cords out of the way.  Do not use floor  polish or wax that makes floors slippery. If you must use wax, use non-skid floor wax.  Do not have throw rugs and other things on the floor that can make you trip. What can I do with my stairs?  Do not leave any items on the stairs.  Make sure that there are handrails on both sides of the stairs and use them. Fix handrails that are broken or loose. Make sure that handrails are as long as the stairways.  Check any carpeting to make sure that it is firmly attached to the stairs. Fix any carpet that is loose or worn.  Avoid having throw rugs at the top or bottom of the stairs. If you do have throw rugs, attach them to the floor with carpet tape.  Make sure that you have a light switch at the top of the stairs and the bottom of the stairs. If you do not have them, ask someone to add them for you. What else can I do to help prevent falls?  Wear shoes  that:  Do not have high heels.  Have rubber bottoms.  Are comfortable and fit you well.  Are closed at the toe. Do not wear sandals.  If you use a stepladder:  Make sure that it is fully opened. Do not climb a closed stepladder.  Make sure that both sides of the stepladder are locked into place.  Ask someone to hold it for you, if possible.  Clearly mark and make sure that you can see:  Any grab bars or handrails.  First and last steps.  Where the edge of each step is.  Use tools that help you move around (mobility aids) if they are needed. These include:  Canes.  Walkers.  Scooters.  Crutches.  Turn on the lights when you go into a dark area. Replace any light bulbs as soon as they burn out.  Set up your furniture so you have a clear path. Avoid moving your furniture around.  If any of your floors are uneven, fix them.  If there are any pets around you, be aware of where they are.  Review your medicines with your doctor. Some medicines can make you feel dizzy. This can increase your chance of falling. Ask your doctor what other things that you can do to help prevent falls. This information is not intended to replace advice given to you by your health care provider. Make sure you discuss any questions you have with your health care provider. Document Released: 06/23/2009 Document Revised: 02/02/2016 Document Reviewed: 10/01/2014 Elsevier Interactive Patient Education  2017 [employer].

## 2020-07-06 NOTE — Progress Notes (Signed)
Subjective:   Veronica Ellis is a 86 y.o. female who presents for Medicare Annual (Subsequent) preventive examination.  Virtual Visit via Telephone Note  I connected with  Veronica Ellis on 07/06/20 at  8:40 AM EDT by telephone and verified that I am speaking with the correct person using two identifiers.  Medicare Annual Wellness visit completed telephonically due to Covid-19 pandemic.   Location: Patient: home Provider: [facility]   I discussed the limitations, risks, security and privacy concerns of performing an evaluation and management service by telephone and the availability of in person appointments. The patient expressed understanding and agreed to proceed.  Unable to perform video visit due to video visit attempted and failed and/or patient does not have video capability.   Some vital signs may be absent or patient reported.   Clemetine Marker, LPN    Review of Systems     Cardiac Risk Factors include: advanced age (>85men, >44 women);dyslipidemia;sedentary lifestyle     Objective:    There were no vitals filed for this visit. There is no height or weight on file to calculate BMI.  Advanced Directives 07/06/2020 03/04/2020 10/01/2019 04/17/2019 09/26/2018 09/26/2018 08/21/2018  Does Patient Have a Medical Advance Directive? No No No No No No No  Type of Advance Directive - - - - - - -  Copy of Healthcare Power of Attorney in Chart? - - - - - - -  Would patient like information on creating a medical advance directive? No - Patient declined No - Patient declined No - Patient declined No - Patient declined No - Patient declined No - Patient declined No - Patient declined    Current Medications (verified) Outpatient Encounter Medications as of 07/06/2020  Medication Sig  . atorvastatin (LIPITOR) 80 MG tablet Take 1 tablet (80 mg total) by mouth daily.  Marland Kitchen buPROPion (WELLBUTRIN) 100 MG tablet Take 1 tablet (100 mg total) by mouth 2 (two) times daily.  . metoprolol  succinate (TOPROL-XL) 25 MG 24 hr tablet Take 25 mg by mouth daily.  . nitroGLYCERIN (NITROSTAT) 0.4 MG SL tablet Place 0.4 mg under the tongue every 5 (five) minutes as needed for chest pain.  Marland Kitchen omeprazole (PRILOSEC) 40 MG capsule Take 1 capsule (40 mg total) by mouth 2 (two) times daily.  Marland Kitchen saccharomyces boulardii (FLORASTOR) 250 MG capsule Take 250 mg by mouth daily.   . sacubitril-valsartan (ENTRESTO) 97-103 MG Take 0.5 tablets by mouth 2 (two) times daily. (Patient taking differently: Take 1 tablet by mouth 2 (two) times daily. )  . spironolactone (ALDACTONE) 25 MG tablet Take 25 mg by mouth daily.  Marland Kitchen torsemide (DEMADEX) 10 MG tablet Take 10 mg by mouth daily.   Marland Kitchen warfarin (COUMADIN) 3 MG tablet Take 3 mg by mouth See admin instructions. Take 1 tablet (3mg ) by mouth every Tuesday and Thursday  . warfarin (COUMADIN) 4 MG tablet Take 4 mg by mouth See admin instructions. Take 1 tablet (4mg ) by mouth every Monday, Wednesday, Friday, Saturday and Sunday   No facility-administered encounter medications on file as of 07/06/2020.    Allergies (verified) Iodine, Erythromycin, Other, Penicillins, and Shellfish allergy   History: Past Medical History:  Diagnosis Date  . Anxiety   . Anxiety   . Arthritis   . Breast cancer ([place]) 2014   bilateral invasive mammary carcinoma. with 36 rad tx.   . Breast cancer ([place])   . Cancer ([place])    breast  . Depression   . Depression   .  Hard of hearing   . Hyperlipemia   . Hypertension   . IBS (irritable bowel syndrome)   . Memory loss   . Osteoporosis   . Personal history of radiation therapy   . Wears dentures    full upper, partial lower   Past Surgical History:  Procedure Laterality Date  . APPENDECTOMY    . BREAST BIOPSY Bilateral 2014  . BREAST LUMPECTOMY Bilateral 05/2013  . BREAST SURGERY Bilateral   . CATARACT EXTRACTION W/PHACO Right 12/11/2017   Procedure: CATARACT EXTRACTION PHACO AND INTRAOCULAR LENS PLACEMENT ([place]) COMPLICATED  RIGHT;  Surgeon: Leandrew Koyanagi, MD;  Location: [facility];  Service: Ophthalmology;  Laterality: Right;  . COLON SURGERY     12 in. removed due to polyps  . LEFT HEART CATH AND CORONARY ANGIOGRAPHY N/A 05/20/2018   Procedure: LEFT HEART CATH AND CORONARY ANGIOGRAPHY;  Surgeon: Corey Skains, MD;  Location: [facility];  Service: Cardiovascular;  Laterality: N/A;  . TUBAL LIGATION     Family History  Problem Relation Age of Onset  . Breast cancer Daughter 29  . Breast cancer Daughter 6  . Cirrhosis Mother   . Lung cancer Father    Social History   Socioeconomic History  . Marital status: Widowed    Spouse name: Not on file  . Number of children: 5  . Years of education: some college  . Highest education level: 12th grade  Occupational History  . Occupation: Retired  Tobacco Use  . Smoking status: Former Smoker    Packs/day: 1.00    Years: 60.00    Pack years: 60.00    Types: Cigarettes    Quit date: 12/2017    Years since quitting: 2.5  . Smokeless tobacco: Never Used  . Tobacco comment: smoking cessation materials not required  Vaping Use  . Vaping Use: Never used  Substance and Sexual Activity  . Alcohol use: Not Currently    Alcohol/week: 0.0 standard drinks  . Drug use: No  . Sexual activity: Yes  Other Topics Concern  . Not on file  Social History Narrative   Pt's son Ed lives with her   Social Determinants of Health   Financial Resource Strain: Medium Risk  . Difficulty of Paying Living Expenses: Somewhat hard  Food Insecurity: No Food Insecurity  . Worried About Charity fundraiser in the Last Year: Never true  . Ran Out of Food in the Last Year: Never true  Transportation Needs: No Transportation Needs  . Lack of Transportation (Medical): No  . Lack of Transportation (Non-Medical): No  Physical Activity: Inactive  . Days of Exercise per Week: 0 days  . Minutes of Exercise per Session: 0 min  Stress: Stress Concern Present    . Feeling of Stress : To some extent  Social Connections: Socially Isolated  . Frequency of Communication with Friends and Family: Three times a week  . Frequency of Social Gatherings with Friends and Family: Twice a week  . Attends Religious Services: Never  . Active Member of Clubs or Organizations: No  . Attends Archivist Meetings: Never  . Marital Status: Widowed    Tobacco Counseling Counseling given: Not Answered Comment: smoking cessation materials not required   Clinical Intake:  Pre-visit preparation completed: Yes  Pain : No/denies pain     Nutritional Risks: None Diabetes: No  How often do you need to have someone help you when you read instructions, pamphlets, or other written materials from  your doctor or pharmacy?: 1 - Never    Interpreter Needed?: No  Information entered by :: Clemetine Marker LPN   Activities of Daily Living In your present state of health, do you have any difficulty performing the following activities: 07/06/2020  Hearing? Y  Comment needs hearing evaluation  Vision? N  Difficulty concentrating or making decisions? Y  Walking or climbing stairs? N  Dressing or bathing? N  Doing errands, shopping? Y  Comment does not Physiological scientist and eating ? N  Using the Toilet? N  In the past six months, have you accidently leaked urine? Y  Comment wears depends for protection  Do you have problems with loss of bowel control? Y  Managing your Medications? N  Managing your Finances? N  Housekeeping or managing your Housekeeping? N  Some recent data might be hidden    [name], MD as PCP - General (Family Medicine) Lloyd Huger, MD as Consulting Physician (Oncology)  Indicate any recent Medical Services you may have received from other than [facility] providers in the past year (date may be approximate).     Assessment:   This is a routine wellness examination for Veronica Ellis.  Hearing/Vision  screen  Hearing Screening   125Hz  250Hz  500Hz  1000Hz  2000Hz  3000Hz  4000Hz  6000Hz  8000Hz   Right ear:           Left ear:           Comments: Pt states mild hearing difficulty  Vision Screening Comments: Sees Dr. Wallace Going for annual eye exams; due for exam  Dietary issues and exercise activities discussed: Current Exercise Habits: The patient does not participate in regular exercise at present, Exercise limited by: cardiac condition(s)  Goals    . DIET - INCREASE WATER INTAKE     Recommend to drink at least 6-8 8oz glasses of water per day.    . Quit smoking / using tobacco     Smoking cessation discussed      Depression Screen PHQ 2/9 Scores 07/06/2020 05/19/2020 01/21/2020 11/13/2019 10/05/2019 05/11/2019 03/03/2018  PHQ - 2 Score 3 0 0 0 0 0 4  PHQ- 9 Score 12 0 0 5 0 0 11    Fall Risk Fall Risk  07/06/2020 01/21/2020 11/13/2019 10/05/2019 08/05/2019  Falls in the past year? 0 0 1 0 1  Comment - - - - Emmi Telephone Survey: data to providers prior to load  Number falls in past yr: 0 - 0 - 1  Comment - - - - Emmi Telephone Survey Actual Response = 3  Injury with Fall? 0 - 0 - 1  Risk for fall due to : No Fall Risks - - - -  Risk for fall due to: Comment - - - - -  Follow up Falls prevention discussed Falls evaluation completed Falls evaluation completed Falls evaluation completed -    Any stairs in or around the home? Yes  If so, are there any without handrails? No  Home free of loose throw rugs in walkways, pet beds, electrical cords, etc? Yes  Adequate lighting in your home to reduce risk of falls? Yes   ASSISTIVE DEVICES UTILIZED TO PREVENT FALLS:  Life alert? No  Use of a cane, walker or w/c? No  Grab bars in the bathroom? No  Shower chair or bench in shower? Yes  Elevated toilet seat or a handicapped toilet? No   TIMED UP AND GO:  Was the test performed? No . Telephonic  visit  Cognitive Function:     6CIT Screen 03/03/2018 02/25/2017  What Year? 0 points 0 points    What month? 0 points 0 points  What time? 0 points 0 points  Count back from 20 0 points 0 points  Months in reverse 0 points 0 points  Repeat phrase 6 points 0 points  Total Score 6 0    Immunizations Immunization History  Administered Date(s) Administered  . Fluad Quad(high Dose 65+) 05/11/2019, 05/19/2020  . Influenza, High Dose Seasonal PF 06/10/2018    TDAP status: Due, Education has been provided regarding the importance of this vaccine. Advised may receive this vaccine at local pharmacy or Health Dept. Aware to provide a copy of the vaccination record if obtained from local pharmacy or Health Dept. Verbalized acceptance and understanding.   Flu Vaccine status: Up to date   Pneumococcal vaccine status: Declined,  Education has been provided regarding the importance of this vaccine but patient still declined. Advised may receive this vaccine at local pharmacy or Health Dept. Aware to provide a copy of the vaccination record if obtained from local pharmacy or Health Dept. Verbalized acceptance and understanding.    Covid-19 vaccine status: Completed vaccines per patient; requested to bring vaccination record  Qualifies for Shingles Vaccine? Yes Zostavax completed No   Shingrix Completed?: No.    Education has been provided regarding the importance of this vaccine. Patient has been advised to call insurance company to determine out of pocket expense if they have not yet received this vaccine. Advised may also receive vaccine at local pharmacy or Health Dept. Verbalized acceptance and understanding.  Screening Tests Health Maintenance  Topic Date Due  . COVID-19 Vaccine (1) Never done  . TETANUS/TDAP  10/04/2020 (Originally 11/11/1952)  . PNA vac Low Risk Adult (1 of 2 - PCV13) 10/04/2020 (Originally 11/10/2000)  . INFLUENZA VACCINE  Completed  . DEXA SCAN  Completed    Health Maintenance  Health Maintenance Due  Topic Date Due  . COVID-19 Vaccine (1) Never done     Colorectal cancer screening: No longer required.    Mammogram status: Completed 09/28/19. Repeat every year   Bone Density status: Completed 09/22/18. Results reflect: Bone density results: OSTEOPOROSIS. Repeat every 2 years.  Lung Cancer Screening: (Low Dose CT Chest recommended if Age 42-80 years, 30 pack-year currently smoking OR have quit w/in 15years.) does not qualify. Pt being followed for lung nodule and had recent chest CT  Additional Screening:  Hepatitis C Screening: does not qualify  Vision Screening: Recommended annual ophthalmology exams for early detection of glaucoma and other disorders of the eye. Is the patient up to date with their annual eye exam?  No Who is the provider or what is the name of the office in which the patient attends annual eye exams? Dr. Cindi Carbon  Dental Screening: Recommended annual dental exams for proper oral hygiene  Community Resource Referral / Chronic Care Management: CRR required this visit?  Yes   CCM required this visit?  No      Plan:     I have personally reviewed and noted the following in the patient's chart:   . Medical and social history . Use of alcohol, tobacco or illicit drugs  . Current medications and supplements . Functional ability and status . Nutritional status . Physical activity . Advanced directives . List of other physicians . Hospitalizations, surgeries, and ER visits in previous 12 months . Vitals . Screenings to include cognitive, depression,  and falls . Referrals and appointments  In addition, I have reviewed and discussed with patient certain preventive protocols, quality metrics, and best practice recommendations. A written personalized care plan for preventive services as well as general preventive health recommendations were provided to patient.     Clemetine Marker, LPN   [id number]   Nurse Notes: PHQ9 score of 12 today but states she has been doing better lately. She gets  frustrated at certain things like no longer driving and not being able to think or something or losing train of thought sometimes but she feels like she is doing okay.

## 2020-07-14 ENCOUNTER — Encounter: Payer: Self-pay | Admitting: Family Medicine

## 2020-07-14 ENCOUNTER — Telehealth: Payer: Self-pay | Admitting: Family Medicine

## 2020-07-14 NOTE — Telephone Encounter (Signed)
   [facility] 07/14/2020   Name: Veronica Ellis   MRN: [id number]   DOB: 10/04/1933   AGE: 86 y.o.   GENDER: female   PCP Juline Patch, MD.   Spoke with patient on 07/12/20 regarding Community Resource Referral for assistance with utilities. Patient stated that currently she does not need assistance. She just wanted to know if she would be able to receive assistance when she needs it. Informed patient that there are resources in her area, but it will depend on what type of assistance needs. Patient asked Care Guide to send resources letter to use as a reference. Patient has no additional needs at this time. Sent e-mail to [name], [facility] for [place] to print and mail letter to patient.    Closing referral pending any other needs of patient.     Little [place]geon, Care Management Phone: [phone] Email: [email or web address]

## 2020-10-01 NOTE — Progress Notes (Deleted)
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  INTERVAL HISTORY: Patient returns to clinic today for routine yearly evaluation and discussion of her mammogram results.  She seems mildly confused today and is unclear of the reason of her visit, but offers no complaints.  She is accompanied by her son. She does not complain of weakness or fatigue today.  She has no neurologic complaints.  She denies any recent fevers or illnesses.  She has a good appetite and denies weight loss.  She denies chest pain, shortness of breath, cough, or hemoptysis.  She denies any nausea, vomiting, constipation, or diarrhea.  She has no urinary complaints.  Patient offers no further specific complaints today.  REVIEW OF SYSTEMS:   Review of Systems  Constitutional: Negative.  Negative for chills, fever, malaise/fatigue and weight loss.  HENT: Negative for congestion.   Respiratory: Negative.  Negative for cough and shortness of breath.   Cardiovascular: Negative.  Negative for chest pain and leg swelling.  Gastrointestinal: Negative.  Negative for abdominal pain, constipation, diarrhea, heartburn, nausea and vomiting.  Genitourinary: Negative.  Negative for frequency, hematuria and urgency.  Musculoskeletal: Negative.  Negative for falls and neck pain.  Neurological: Negative.  Negative for sensory change, focal weakness, weakness and headaches.  Psychiatric/Behavioral: Positive for memory loss. The patient is not nervous/anxious and does not have insomnia.     As per HPI. Otherwise, a complete review of  systems is negative.  PAST MEDICAL HISTORY: Past Medical History:  Diagnosis Date  . Anxiety   . Anxiety   . Arthritis   . Breast cancer ([place]) 2014   bilateral invasive mammary carcinoma. with 36 rad tx.   . Breast cancer ([place])   . Cancer ([place])    breast  . Depression   . Depression   . Hard of hearing   . Hyperlipemia   . Hypertension   . IBS (irritable bowel syndrome)   . Memory loss   . Osteoporosis   . Personal history of radiation therapy   . Wears dentures    full upper, partial lower    PAST SURGICAL HISTORY: Past Surgical History:  Procedure Laterality Date  . APPENDECTOMY    . BREAST BIOPSY Bilateral 2014  . BREAST LUMPECTOMY Bilateral 05/2013  . BREAST SURGERY Bilateral   . CATARACT EXTRACTION W/PHACO Right 12/11/2017   Procedure: CATARACT EXTRACTION PHACO AND INTRAOCULAR LENS PLACEMENT ([place]) COMPLICATED RIGHT;  Surgeon: Leandrew Koyanagi, MD;  Location: [facility];  Service: Ophthalmology;  Laterality: Right;  . COLON SURGERY     12 in. removed due to polyps  . LEFT HEART CATH AND CORONARY ANGIOGRAPHY N/A 05/20/2018   Procedure: LEFT HEART CATH AND CORONARY ANGIOGRAPHY;  Surgeon: Corey Skains, MD;  Location: [facility];  Service: Cardiovascular;  Laterality: N/A;  . TUBAL LIGATION      FAMILY HISTORY Family History  Problem Relation Age of Onset  . Breast cancer Daughter 68  . Breast cancer Daughter 30  . Cirrhosis Mother   . Lung cancer Father        ADVANCED DIRECTIVES:    HEALTH MAINTENANCE: Social History  Tobacco Use  . Smoking status: Former Smoker    Packs/day: 1.00    Years: 60.00    Pack years: 60.00    Types: Cigarettes    Quit date: 12/2017    Years since quitting: 2.8  . Smokeless tobacco: Never Used  . Tobacco comment: smoking cessation materials not required  Vaping Use  . Vaping Use: Never used  Substance Use Topics  . Alcohol use: Not Currently    Alcohol/week: 0.0 standard drinks  . Drug use:  No     Colonoscopy:  PAP:  Bone density:  Lipid panel:  Allergies  Allergen Reactions  . Iodine Nausea Only    Betadine okay   . Erythromycin Rash  . Other Rash  . Penicillins Rash    Has patient had a PCN reaction causing immediate rash, facial/tongue/throat swelling, SOB or lightheadedness with hypotension: No Has patient had a PCN reaction causing severe rash involving mucus membranes or skin necrosis: No Has patient had a PCN reaction that required hospitalization: No Has patient had a PCN reaction occurring within the last 10 years: No If all of the above answers are "NO", then may proceed with Cephalosporin use.   . Shellfish Allergy Rash    Current Outpatient Medications  Medication Sig Dispense Refill  . atorvastatin (LIPITOR) 80 MG tablet Take 1 tablet (80 mg total) by mouth daily. 90 tablet 1  . buPROPion (WELLBUTRIN) 100 MG tablet Take 1 tablet (100 mg total) by mouth 2 (two) times daily. 180 tablet 1  . metoprolol succinate (TOPROL-XL) 25 MG 24 hr tablet Take 25 mg by mouth daily.    . nitroGLYCERIN (NITROSTAT) 0.4 MG SL tablet Place 0.4 mg under the tongue every 5 (five) minutes as needed for chest pain.    Marland Kitchen omeprazole (PRILOSEC) 40 MG capsule Take 1 capsule (40 mg total) by mouth 2 (two) times daily. 180 capsule 1  . saccharomyces boulardii (FLORASTOR) 250 MG capsule Take 250 mg by mouth daily.     . sacubitril-valsartan (ENTRESTO) 97-103 MG Take 0.5 tablets by mouth 2 (two) times daily. (Patient taking differently: Take 1 tablet by mouth 2 (two) times daily. ) 60 tablet 0  . spironolactone (ALDACTONE) 25 MG tablet Take 25 mg by mouth daily.    Marland Kitchen torsemide (DEMADEX) 10 MG tablet Take 10 mg by mouth daily.     Marland Kitchen warfarin (COUMADIN) 3 MG tablet Take 3 mg by mouth See admin instructions. Take 1 tablet (47m) by mouth every Tuesday and Thursday    . warfarin (COUMADIN) 4 MG tablet Take 4 mg by mouth See admin instructions. Take 1 tablet (457m by mouth every Monday,  Wednesday, Friday, Saturday and Sunday     No current facility-administered medications for this visit.    OBJECTIVE: There were no vitals filed for this visit.   There is no height or weight on file to calculate BMI.    ECOG FS:0 - Asymptomatic  General: Well-developed, well-nourished, no acute distress. Eyes: Pink conjunctiva, anicteric sclera. HEENT: Normocephalic, moist mucous membranes. Breast: Exam deferred today. Lungs: No audible wheezing or coughing. Heart: Regular rate and rhythm. Abdomen: Soft, nontender, no obvious distention. Musculoskeletal: No edema, cyanosis, or clubbing. Neuro: Alert, answering all questions appropriately. Cranial nerves grossly intact. Skin: No rashes or petechiae noted. Psych: Normal affect.   LAB RESULTS:  Lab Results  Component Value Date   NA 138 05/19/2020   K 4.9 05/19/2020   CL 101 05/19/2020   CO2 25 05/19/2020  GLUCOSE 87 05/19/2020   BUN 18 05/19/2020   CREATININE 1.15 (H) 05/19/2020   CALCIUM 9.1 05/19/2020   PROT 6.2 (L) 03/04/2020   ALBUMIN 3.9 05/19/2020   AST 22 03/04/2020   ALT 27 03/04/2020   ALKPHOS 73 03/04/2020   BILITOT 1.3 (H) 03/04/2020   GFRNONAA 44 (L) 05/19/2020   GFRAA 50 (L) 05/19/2020    Lab Results  Component Value Date   WBC 8.9 03/04/2020   NEUTROABS 4.5 01/08/2019   HGB 12.6 03/04/2020   HCT 38.1 03/04/2020   MCV 89.9 03/04/2020   PLT 284 03/04/2020     STUDIES: No results found.  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  PLAN:    1. Bilateral breast cancer:  Previously after lengthy discussion with patient and her family about her intermediate Oncotype score, she declined adjuvant chemotherapy.  Unclear of patient's compliance with letrozole, but she completed 5 years of treatment in January 2020.  Patient previously declined extended aromatase inhibitor  treatment.  Her most recent mammogram on September 12, 2019 was reported as BI-RADS 2.  Repeat in January 2022.  Return to clinic in 1 year for routine evaluation.   2.  Osteopenia: Patient's most recent bone mineral density on September 22, 2018 reported T score of -3.2.  Patient has now completed letrozole.  Continue monitoring and treatment per primary care.  Patient was seen and evaluated independently and I agree with the assessment and plan as dictated above.  Lloyd Huger, MD 10/01/20 2:15 PM

## 2020-10-03 ENCOUNTER — Inpatient Hospital Stay: Admission: RE | Admit: 2020-10-03 | Payer: PPO | Source: Ambulatory Visit

## 2020-10-06 ENCOUNTER — Inpatient Hospital Stay: Payer: Medicare HMO | Admitting: Oncology

## 2020-10-06 DIAGNOSIS — C50911 Malignant neoplasm of unspecified site of right female breast: Secondary | ICD-10-CM

## 2020-12-16 ENCOUNTER — Telehealth: Payer: Self-pay

## 2020-12-16 NOTE — Telephone Encounter (Signed)
Copied from [place] [id number]. Topic: Referral - Request for Referral >> Dec 16, 2020  2:36 PM Tessa Lerner A wrote: Has patient seen PCP for this complaint? Yes  *If NO, is insurance requiring patient see PCP for this issue before PCP can refer them?  Referral for which specialty: Gastroenterology  Preferred provider/office: [facility]enterology - [facility]   Reason for referral: Patient is experiencing frequent diarrhea

## 2020-12-20 ENCOUNTER — Encounter: Payer: Self-pay | Admitting: Family Medicine

## 2020-12-20 ENCOUNTER — Other Ambulatory Visit: Payer: Self-pay

## 2020-12-20 ENCOUNTER — Ambulatory Visit (INDEPENDENT_AMBULATORY_CARE_PROVIDER_SITE_OTHER): Payer: Medicare HMO | Admitting: Family Medicine

## 2020-12-20 VITALS — BP 120/64 | HR 60 | Ht 67.0 in | Wt 165.0 lb

## 2020-12-20 DIAGNOSIS — E7849 Other hyperlipidemia: Secondary | ICD-10-CM

## 2020-12-20 DIAGNOSIS — R69 Illness, unspecified: Secondary | ICD-10-CM | POA: Diagnosis not present

## 2020-12-20 DIAGNOSIS — Z7901 Long term (current) use of anticoagulants: Secondary | ICD-10-CM | POA: Diagnosis not present

## 2020-12-20 DIAGNOSIS — F3341 Major depressive disorder, recurrent, in partial remission: Secondary | ICD-10-CM | POA: Diagnosis not present

## 2020-12-20 DIAGNOSIS — K922 Gastrointestinal hemorrhage, unspecified: Secondary | ICD-10-CM

## 2020-12-20 DIAGNOSIS — K219 Gastro-esophageal reflux disease without esophagitis: Secondary | ICD-10-CM | POA: Diagnosis not present

## 2020-12-20 DIAGNOSIS — H6121 Impacted cerumen, right ear: Secondary | ICD-10-CM | POA: Diagnosis not present

## 2020-12-20 DIAGNOSIS — I480 Paroxysmal atrial fibrillation: Secondary | ICD-10-CM | POA: Diagnosis not present

## 2020-12-20 DIAGNOSIS — Z8673 Personal history of transient ischemic attack (TIA), and cerebral infarction without residual deficits: Secondary | ICD-10-CM | POA: Diagnosis not present

## 2020-12-20 MED ORDER — BUPROPION HCL 100 MG PO TABS: 100.0000 mg | ORAL_TABLET | Freq: Two times a day (BID) | ORAL | 1 refills | Status: DC

## 2020-12-20 MED ORDER — CARBAMIDE PEROXIDE 6.5 % OT SOLN: 5.0000 [drp] | Freq: Two times a day (BID) | OTIC | 0 refills | Status: DC

## 2020-12-20 MED ORDER — OMEPRAZOLE 40 MG PO CPDR: 40.0000 mg | DELAYED_RELEASE_CAPSULE | Freq: Two times a day (BID) | ORAL | 1 refills | Status: DC

## 2020-12-20 MED ORDER — ATORVASTATIN CALCIUM 80 MG PO TABS: 80.0000 mg | ORAL_TABLET | Freq: Every day | ORAL | 1 refills | Status: DC

## 2020-12-20 NOTE — Patient Instructions (Signed)
Cardiomyopathy, Adult  Cardiomyopathy is a long-term (chronic) disease of the heart muscle. The disease makes the heart muscle thick, weak, or stiff. As a result, the heart works harder to pump blood. Over time, cardiomyopathy can lead to an irregular heartbeat (arrhythmia) and a condition in which the heart has trouble pumping blood (heart failure). There are several types of cardiomyopathy. The kind of cardiomyopathy that you have depends on what part of the heart is affected and how it is affected. What are the causes? This condition may be caused by:  A medical condition that damages the heart, such as diabetes, high blood pressure, or heart attack.  Diseases of the immune system, connective tissues, or endocrine system.  Alcohol abuse, use of illegal drugs, and taking certain medicines.  Pregnancy.  Too much iron in the body (hemochromatosis).  Cancer treatments.  Protein buildup in the organs or inflammation in the organs. In some cases, the cause is not known. What increases the risk? You are more likely to develop this condition if:  You have a gene that is passed down (inherited) from a family member.  You are overweight or obese. What are the signs or symptoms? Often, people with this condition have no symptoms. If you do have symptoms, this may include:  Shortness of breath, especially during activity.  Fatigue.  An arrhythmia.  Feeling dizzy or light-headed, or fainting.  Chest pain.  Coughing.  Swelling of the lower legs, feet, abdomen, or neck veins. How is this diagnosed? This condition is diagnosed based on:  Your symptoms and medical history.  A physical exam.  Blood tests and imaging studies, such as X-ray, echocardiogram, or MRI.  Other tests, such as: ? An electrocardiogram (ECG). ? A portable heart monitor that records your heart's electrical activity (event monitor). ? A stress test. ? Cardiac catheterization and coronary  angiogram. ? Heart tissue biopsy. ? An MRI of the heart. How is this treated? Your treatment depends on the type and severity of your symptoms. If you do not have symptoms, you may not need treatment. Treatment may include:  General healthy lifestyle changes.  Medicines to: ? Treat high blood pressure, abnormal heart rate, or inflammation. ? Clear excess fluids from your body. ? Prevent blood clots. ? Balance minerals (electrolytes) in your body and get rid of extra sodium in your body. ? Strengthen your heartbeat.  Surgery to: ? Repair a heart defect, remove heart tissue, or destroy tissues in the area of abnormal electrical activity (ablation). ? Implant a device to treat serious heart rhythm problems or to restore the heart's ability to pump blood, such as a pacemaker or a defibrillator. ? Replace your heart with a healthy heart. This is done if all other treatments have failed. Other treatment may include cardiac resynchronization therapy (CRT). This restores the heart's normal beating pattern. Follow these instructions at home: Medicines  Take over-the-counter and prescription medicines only as told by your health care provider. Some medicines can be dangerous for your heart.  If you are prescribed a blood thinner, make sure you understand what to do in a bleeding situation.  Tell all health care providers, including your dentist, that you have cardiomyopathy. Ask your health care provider if you need antibiotics before having dental care or before surgery. Lifestyle  Eat a heart-healthy diet that includes plenty of fruits, vegetables, whole grains, and foods that are low in salt (sodium).  Maintain a healthy weight.  Stay physically active. Ask your health care provider what  activities are safe for you.  Do not use any products that contain nicotine or tobacco, such as cigarettes, e-cigarettes, and chewing tobacco. If you need help quitting, ask your health care  provider.  Try to get at least 7 hours of sleep each night.  Find healthy ways to manage stress.   Alcohol use  Do not drink alcohol if: ? Your health care provider tells you not to drink. ? You are pregnant, may be pregnant, or are planning to become pregnant. If you drink alcohol:  Limit how much you use to: ? 0-1 drink a day for women. ? 0-2 drinks a day for men.  Be aware of how much alcohol is in your drink. In the [place] one drink equals one 12 oz bottle of beer (355 mL), one 5 oz glass of wine (148 mL), or one 1 oz glass of hard liquor (44 mL). General instructions  Ask your health care provider if you should wear a medical identification bracelet. This may be important if you have a pacemaker or a defibrillator.  Get all needed vaccines. Get a flu shot every year.  Work closely with your health care provider to manage any other chronic conditions.  If you plan to start a family, talk with a genetic counselor to discuss the risk of having a child with cardiomyopathy.  Keep all follow-up visits as told by your health care provider. This is important, even if you do not have any symptoms. Your health care provider may need to make sure your condition is not getting worse. Where to find more information  American Heart Association: www.heart.org Contact a health care provider if:  Your symptoms get worse.  You have new symptoms. Get help right away if you:  Have severe chest pain.  Have shortness of breath.  Cough up a pink, bubbly substance.  Feel nauseous and you vomit.  Suddenly become light-headed or dizzy.  Feel your heart beating very quickly.  Feel like your heart is skipping beats. These symptoms may represent a serious problem that is an emergency. Do not wait to see if the symptoms will go away. Get medical help right away. Call your local emergency services (911 in the [place] Do not drive yourself to the hospital. Summary  Cardiomyopathy is a  long-term (chronic) disease of the heart muscle.  Over time, cardiomyopathy can lead to an irregular heartbeat (arrhythmia) and heart failure.  A number of treatments are available for this condition. Your treatment will depend on the type and severity of your symptoms.  Follow your health care provider's instructions on diet, medicines, physical activity, and when to seek help. This information is not intended to replace advice given to you by your health care provider. Make sure you discuss any questions you have with your health care provider. Document Revised: 06/03/2019 Document Reviewed: 06/03/2019 Elsevier Patient Education  [place].

## 2020-12-20 NOTE — Progress Notes (Signed)
Date:  12/20/2020   Name:  Veronica Ellis   DOB:  31-Jul-1934   MRN:  [id number]   Chief Complaint: Hyperlipidemia, Depression, and Gastroesophageal Reflux  Hyperlipidemia This is a chronic problem. The current episode started more than 1 year ago. The problem is controlled. Recent lipid tests were reviewed and are normal. She has no history of chronic renal disease, diabetes, hypothyroidism, liver disease, obesity or nephrotic syndrome. Pertinent negatives include no chest pain, focal sensory loss, focal weakness, leg pain, myalgias or shortness of breath. Current antihyperlipidemic treatment includes statins. The current treatment provides moderate improvement of lipids. There are no compliance problems.   Depression        This is a chronic problem.  The current episode started more than 1 year ago.   The problem occurs intermittently.  The problem has been gradually improving since onset.  Associated symptoms include no decreased concentration, no fatigue, no helplessness, no hopelessness, does not have insomnia, not irritable, no restlessness, no decreased interest, no appetite change, no body aches, no myalgias, no headaches, no indigestion, not sad and no suicidal ideas.     Exacerbated by: rooster.  Past treatments include SSRIs - Selective serotonin reuptake inhibitors.  Compliance with treatment is good.  Previous treatment provided moderate relief.   Pertinent negatives include no hypothyroidism. Gastroesophageal Reflux She reports no abdominal pain, no belching, no chest pain, no choking, no coughing, no dysphagia, no early satiety, no globus sensation, no heartburn, no hoarse voice, no nausea, no sore throat, no stridor, no tooth decay, no water brash or no wheezing. This is a chronic problem. The current episode started more than 1 year ago. The problem has been gradually improving. The symptoms are aggravated by certain foods. Pertinent negatives include no anemia, fatigue, melena,  muscle weakness, orthopnea or weight loss.    Lab Results  Component Value Date   CREATININE 1.15 (H) 05/19/2020   BUN 18 05/19/2020   NA 138 05/19/2020   K 4.9 05/19/2020   CL 101 05/19/2020   CO2 25 05/19/2020   Lab Results  Component Value Date   CHOL 129 05/19/2020   HDL 52 05/19/2020   LDLCALC 56 05/19/2020   TRIG 120 05/19/2020   CHOLHDL 3.6 05/20/2018   Lab Results  Component Value Date   TSH 5.789 (H) 05/19/2018   Lab Results  Component Value Date   HGBA1C 5.4 05/19/2018   Lab Results  Component Value Date   WBC 8.9 03/04/2020   HGB 12.6 03/04/2020   HCT 38.1 03/04/2020   MCV 89.9 03/04/2020   PLT 284 03/04/2020   Lab Results  Component Value Date   ALT 27 03/04/2020   AST 22 03/04/2020   ALKPHOS 73 03/04/2020   BILITOT 1.3 (H) 03/04/2020     Review of Systems  Constitutional: Negative.  Negative for appetite change, chills, fatigue, fever, unexpected weight change and weight loss.  HENT: Negative for congestion, ear discharge, ear pain, hoarse voice, rhinorrhea, sinus pressure, sneezing and sore throat.   Eyes: Negative for photophobia, pain, discharge, redness and itching.  Respiratory: Negative for cough, choking, shortness of breath, wheezing and stridor.   Cardiovascular: Negative for chest pain.  Gastrointestinal: Negative for abdominal pain, blood in stool, constipation, diarrhea, dysphagia, heartburn, melena, nausea and vomiting.  Endocrine: Negative for cold intolerance, heat intolerance, polydipsia, polyphagia and polyuria.  Genitourinary: Negative for dysuria, flank pain, frequency, hematuria, menstrual problem, pelvic pain, urgency, vaginal bleeding and vaginal discharge.  Musculoskeletal: Negative  for arthralgias, back pain, myalgias and muscle weakness.  Skin: Negative for rash.  Allergic/Immunologic: Negative for environmental allergies and food allergies.  Neurological: Negative for dizziness, focal weakness, weakness, light-headedness,  numbness and headaches.  Hematological: Negative for adenopathy. Does not bruise/bleed easily.  Psychiatric/Behavioral: Positive for depression. Negative for decreased concentration, dysphoric mood and suicidal ideas. The patient is not nervous/anxious and does not have insomnia.     Patient Active Problem List   Diagnosis Date Noted  . GERD without esophagitis 08/31/2019  . History of IBS 09/26/2018  . Long term (current) use of anticoagulants 07/17/2018  . 3-vessel CAD 07/08/2018  . Chronic combined systolic and diastolic CHF (congestive heart failure) ([place]) 07/08/2018  . Ischemic cardiomyopathy 07/08/2018  . AKI (acute kidney injury) ([place]) 06/06/2018  . Hyperkalemia 06/05/2018  . Atrial fibrillation with RVR ([place]) 05/21/2018  . Microcytic anemia 05/21/2018  . NSTEMI (non-ST elevated myocardial infarction) ([place]) 05/19/2018  . Acute on chronic systolic heart failure ([place]) 07/30/2017  . LBBB (left bundle branch block) 07/10/2017  . Lumbar radiculopathy 05/16/2017  . Hx of low back pain 05/14/2017  . Ataxia 12/31/2016  . Memory loss 12/31/2016  . Recurrent major depressive disorder, in partial remission ([place]) 12/31/2016  . Bipolar 1 disorder, mixed, moderate ([place]) 12/25/2016  . Chronic bilateral low back pain with bilateral sciatica 10/18/2016  . Bilateral breast cancer ([place]) 02/20/2016  . 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 Cerebrovascular disease 02/16/2015  . Anxiety 02/16/2015  . Breast lump 02/16/2015  . 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 Routine general medical examination at a health care facility 02/16/2015  . Recurrent major depressive episodes ([place]) 02/16/2015  . Below normal amount of sodium in the blood 02/16/2015  . Pre-operative cardiovascular examination 02/16/2015    Allergies  Allergen Reactions  . Iodine Nausea Only    Betadine okay   . Erythromycin Rash  . Other Rash  .  Penicillins Rash    Has patient had a PCN reaction causing immediate rash, facial/tongue/throat swelling, SOB or lightheadedness with hypotension: No Has patient had a PCN reaction causing severe rash involving mucus membranes or skin necrosis: No Has patient had a PCN reaction that required hospitalization: No Has patient had a PCN reaction occurring within the last 10 years: No If all of the above answers are "NO", then may proceed with Cephalosporin use.   Marland Kitchen Shellfish Allergy Rash    Past Surgical History:  Procedure Laterality Date  . APPENDECTOMY    . BREAST BIOPSY Bilateral 2014  . BREAST LUMPECTOMY Bilateral 05/2013  . BREAST SURGERY Bilateral   . CATARACT EXTRACTION W/PHACO Right 12/11/2017   Procedure: CATARACT EXTRACTION PHACO AND INTRAOCULAR LENS PLACEMENT ([place]) COMPLICATED RIGHT;  Surgeon: Leandrew Koyanagi, MD;  Location: [facility];  Service: Ophthalmology;  Laterality: Right;  . COLON SURGERY     12 in. removed due to polyps  . LEFT HEART CATH AND CORONARY ANGIOGRAPHY N/A 05/20/2018   Procedure: LEFT HEART CATH AND CORONARY ANGIOGRAPHY;  Surgeon: Corey Skains, MD;  Location: [facility];  Service: Cardiovascular;  Laterality: N/A;  . TUBAL LIGATION      Social History   Tobacco Use  . Smoking status: Former Smoker    Packs/day: 1.00    Years: 60.00    Pack years: 60.00    Types: Cigarettes    Quit date: 12/2017    Years since quitting: 3.0  . Smokeless tobacco: Never Used  .  Tobacco comment: smoking cessation materials not required  Vaping Use  . Vaping Use: Never used  Substance Use Topics  . Alcohol use: Not Currently    Alcohol/week: 0.0 standard drinks  . Drug use: No     Medication list has been reviewed and updated.  Current Meds  Medication Sig  . atorvastatin (LIPITOR) 80 MG tablet Take 1 tablet (80 mg total) by mouth daily.  Marland Kitchen buPROPion (WELLBUTRIN) 100 MG tablet Take 1 tablet (100 mg total) by mouth 2 (two) times  daily.  . metoprolol succinate (TOPROL-XL) 25 MG 24 hr tablet Take 25 mg by mouth daily.  . nitroGLYCERIN (NITROSTAT) 0.4 MG SL tablet Place 0.4 mg under the tongue every 5 (five) minutes as needed for chest pain.  Marland Kitchen omeprazole (PRILOSEC) 40 MG capsule Take 1 capsule (40 mg total) by mouth 2 (two) times daily.  Marland Kitchen saccharomyces boulardii (FLORASTOR) 250 MG capsule Take 250 mg by mouth daily.   . sacubitril-valsartan (ENTRESTO) 97-103 MG Take 0.5 tablets by mouth 2 (two) times daily. (Patient taking differently: Take 1 tablet by mouth 2 (two) times daily.)  . spironolactone (ALDACTONE) 25 MG tablet Take 25 mg by mouth daily.  Marland Kitchen warfarin (COUMADIN) 3 MG tablet Take 3 mg by mouth See admin instructions. Take 1 tablet (3mg ) by mouth every Tuesday and Thursday  . warfarin (COUMADIN) 4 MG tablet Take 4 mg by mouth See admin instructions. Take 1 tablet (4mg ) by mouth every Monday, Wednesday, Friday, Saturday and Sunday    [facility] 2/9 Scores 12/20/2020 07/06/2020 05/19/2020 01/21/2020  PHQ - 2 Score 0 3 0 0  PHQ- 9 Score 2 12 0 0    GAD 7 : Generalized Anxiety Score 12/20/2020 05/19/2020 01/21/2020 11/13/2019  Nervous, Anxious, on Edge 0 1 0 1  Control/stop worrying 0 1 0 2  Worry too much - different things 0 1 0 2  Trouble relaxing 0 0 0 1  Restless 0 0 0 0  Easily annoyed or irritable 0 0 0 0  Afraid - awful might happen 0 0 0 0  Total GAD 7 Score 0 3 0 6  Anxiety Difficulty - Not difficult at all - Not difficult at all    BP Readings from Last 3 Encounters:  12/20/20 120/64  05/19/20 120/70  03/04/20 (!) 121/48    Physical Exam Vitals and nursing note reviewed.  Constitutional:      General: She is not irritable.    Appearance: She is well-developed.  HENT:     Head: Normocephalic.     Right Ear: External ear normal. There is impacted cerumen.     Left Ear: Tympanic membrane, ear canal and external ear normal. There is no impacted cerumen.     Nose: Nose normal. No congestion or rhinorrhea.      Mouth/Throat:     Mouth: Mucous membranes are moist.  Eyes:     General: Lids are everted, no foreign bodies appreciated. No scleral icterus.       Left eye: No foreign body or hordeolum.     Conjunctiva/sclera: Conjunctivae normal.     Right eye: Right conjunctiva is not injected.     Left eye: Left conjunctiva is not injected.     Pupils: Pupils are equal, round, and reactive to light.  Neck:     Thyroid: No thyromegaly.     Vascular: No JVD.     Trachea: No tracheal deviation.  Cardiovascular:     Rate and Rhythm: Normal rate and  regular rhythm.     Heart sounds: Normal heart sounds. No murmur heard. No friction rub. No gallop.   Pulmonary:     Effort: Pulmonary effort is normal. No respiratory distress.     Breath sounds: Normal breath sounds. No wheezing, rhonchi or rales.  Abdominal:     General: Bowel sounds are normal.     Palpations: Abdomen is soft. There is no mass.     Tenderness: There is no abdominal tenderness. There is no guarding or rebound.  Musculoskeletal:        General: No tenderness. Normal range of motion.     Cervical back: Normal range of motion and neck supple.  Lymphadenopathy:     Cervical: No cervical adenopathy.  Skin:    General: Skin is warm.     Findings: No rash.  Neurological:     Mental Status: She is alert and oriented to person, place, and time.     Cranial Nerves: No cranial nerve deficit.     Deep Tendon Reflexes: Reflexes normal.  Psychiatric:        Mood and Affect: Mood is not anxious or depressed.     Wt Readings from Last 3 Encounters:  12/20/20 165 lb (74.8 kg)  05/19/20 166 lb (75.3 kg)  03/04/20 163 lb 12.8 oz (74.3 kg)    BP 120/64   Pulse 60   Ht 5\' 7"  (1.702 m)   Wt 165 lb (74.8 kg)   BMI 25.84 kg/m   Assessment and Plan: 1. Familial multiple lipoprotein-type hyperlipidemia Chronic.  Controlled.  Stable.  Continue atorvastatin 80 mg once a day. - atorvastatin (LIPITOR) 80 MG tablet; Take 1 tablet (80 mg  total) by mouth daily.  Dispense: 90 tablet; Refill: 1  2. Recurrent major depressive disorder, in partial remission (HCC) Chronic.  Controlled.  Stable.  PHQ is 2 Gad score 0.  Continue Wellbutrin 100 mg once a day. - buPROPion (WELLBUTRIN) 100 MG tablet; Take 1 tablet (100 mg total) by mouth 2 (two) times daily.  Dispense: 180 tablet; Refill: 1  3. GERD without esophagitis Chronic.  Controlled.  Stable.  Continue omeprazole 40 mg once a day. - omeprazole (PRILOSEC) 40 MG capsule; Take 1 capsule (40 mg total) by mouth 2 (two) times daily.  Dispense: 180 capsule; Refill: 1  4. Acute upper GI bleed Patient with history of a GI bleed which is currently stable with the administration of omeprazole. - omeprazole (PRILOSEC) 40 MG capsule; Take 1 capsule (40 mg total) by mouth 2 (two) times daily.  Dispense: 180 capsule; Refill: 1  5. Impacted cerumen of right ear Patient has an impacted right auditory canal.  We will treat with Debrox 5 drops in the right ear twice a day and irrigate with bulb syringe. - carbamide peroxide (DEBROX) 6.5 % OTIC solution; Place 5 drops into the right ear 2 (two) times daily.  Dispense: 15 mL; Refill: 0

## 2020-12-21 DIAGNOSIS — Z7901 Long term (current) use of anticoagulants: Secondary | ICD-10-CM | POA: Diagnosis not present

## 2020-12-21 DIAGNOSIS — I480 Paroxysmal atrial fibrillation: Secondary | ICD-10-CM | POA: Diagnosis not present

## 2020-12-21 DIAGNOSIS — Z8673 Personal history of transient ischemic attack (TIA), and cerebral infarction without residual deficits: Secondary | ICD-10-CM | POA: Diagnosis not present

## 2020-12-26 ENCOUNTER — Telehealth: Payer: Self-pay

## 2020-12-26 ENCOUNTER — Other Ambulatory Visit: Payer: Self-pay

## 2020-12-26 DIAGNOSIS — H6121 Impacted cerumen, right ear: Secondary | ICD-10-CM

## 2020-12-26 NOTE — Progress Notes (Unsigned)
Placed ref to ENT 

## 2020-12-26 NOTE — Telephone Encounter (Signed)
Copied from [place] [id number]. Topic: Referral - Request for Referral >> Dec 26, 2020 11:37 AM Lennox Solders wrote: has patient seen PCP for this complaint? Yes.. Pt seen dr Ronnald Ramp on 12-20-2020. Pt daughter said dr Ronnald Ramp told her to go get eardrops. Pt has ear waxes in both ears and would like to see ENT Reason for referral: ear pain and both ears are clogged. Pt has [employer]

## 2020-12-26 NOTE — Telephone Encounter (Signed)
Put in referral to ENT

## 2021-01-03 DIAGNOSIS — H903 Sensorineural hearing loss, bilateral: Secondary | ICD-10-CM | POA: Diagnosis not present

## 2021-01-03 DIAGNOSIS — H6121 Impacted cerumen, right ear: Secondary | ICD-10-CM | POA: Diagnosis not present

## 2021-01-05 DIAGNOSIS — Z7901 Long term (current) use of anticoagulants: Secondary | ICD-10-CM | POA: Diagnosis not present

## 2021-01-05 DIAGNOSIS — I5042 Chronic combined systolic (congestive) and diastolic (congestive) heart failure: Secondary | ICD-10-CM | POA: Diagnosis not present

## 2021-01-05 DIAGNOSIS — I480 Paroxysmal atrial fibrillation: Secondary | ICD-10-CM | POA: Diagnosis not present

## 2021-01-05 DIAGNOSIS — I447 Left bundle-branch block, unspecified: Secondary | ICD-10-CM | POA: Diagnosis not present

## 2021-01-05 DIAGNOSIS — I10 Essential (primary) hypertension: Secondary | ICD-10-CM | POA: Diagnosis not present

## 2021-01-05 DIAGNOSIS — I255 Ischemic cardiomyopathy: Secondary | ICD-10-CM | POA: Diagnosis not present

## 2021-01-05 DIAGNOSIS — I2510 Atherosclerotic heart disease of native coronary artery without angina pectoris: Secondary | ICD-10-CM | POA: Diagnosis not present

## 2021-01-18 DIAGNOSIS — Z7901 Long term (current) use of anticoagulants: Secondary | ICD-10-CM | POA: Diagnosis not present

## 2021-01-18 DIAGNOSIS — I480 Paroxysmal atrial fibrillation: Secondary | ICD-10-CM | POA: Diagnosis not present

## 2021-01-18 DIAGNOSIS — Z8673 Personal history of transient ischemic attack (TIA), and cerebral infarction without residual deficits: Secondary | ICD-10-CM | POA: Diagnosis not present

## 2021-01-19 DIAGNOSIS — I480 Paroxysmal atrial fibrillation: Secondary | ICD-10-CM | POA: Diagnosis not present

## 2021-01-19 DIAGNOSIS — Z7901 Long term (current) use of anticoagulants: Secondary | ICD-10-CM | POA: Diagnosis not present

## 2021-01-19 DIAGNOSIS — Z8673 Personal history of transient ischemic attack (TIA), and cerebral infarction without residual deficits: Secondary | ICD-10-CM | POA: Diagnosis not present

## 2021-01-19 DIAGNOSIS — I4891 Unspecified atrial fibrillation: Secondary | ICD-10-CM | POA: Diagnosis not present

## 2021-05-19 DIAGNOSIS — K219 Gastro-esophageal reflux disease without esophagitis: Secondary | ICD-10-CM | POA: Diagnosis not present

## 2021-05-19 DIAGNOSIS — Z7901 Long term (current) use of anticoagulants: Secondary | ICD-10-CM | POA: Diagnosis not present

## 2021-05-19 DIAGNOSIS — Z8673 Personal history of transient ischemic attack (TIA), and cerebral infarction without residual deficits: Secondary | ICD-10-CM | POA: Diagnosis not present

## 2021-05-19 DIAGNOSIS — R634 Abnormal weight loss: Secondary | ICD-10-CM | POA: Diagnosis not present

## 2021-05-19 DIAGNOSIS — I255 Ischemic cardiomyopathy: Secondary | ICD-10-CM | POA: Diagnosis not present

## 2021-05-19 DIAGNOSIS — M940 Chondrocostal junction syndrome [Tietze]: Secondary | ICD-10-CM | POA: Diagnosis not present

## 2021-05-19 DIAGNOSIS — I5042 Chronic combined systolic (congestive) and diastolic (congestive) heart failure: Secondary | ICD-10-CM | POA: Diagnosis not present

## 2021-05-19 DIAGNOSIS — K921 Melena: Secondary | ICD-10-CM | POA: Diagnosis not present

## 2021-05-19 DIAGNOSIS — I447 Left bundle-branch block, unspecified: Secondary | ICD-10-CM | POA: Diagnosis not present

## 2021-05-19 DIAGNOSIS — E7800 Pure hypercholesterolemia, unspecified: Secondary | ICD-10-CM | POA: Diagnosis not present

## 2021-05-19 DIAGNOSIS — I429 Cardiomyopathy, unspecified: Secondary | ICD-10-CM | POA: Diagnosis not present

## 2021-05-19 DIAGNOSIS — I480 Paroxysmal atrial fibrillation: Secondary | ICD-10-CM | POA: Diagnosis not present

## 2021-05-19 DIAGNOSIS — I2510 Atherosclerotic heart disease of native coronary artery without angina pectoris: Secondary | ICD-10-CM | POA: Diagnosis not present

## 2021-05-23 DIAGNOSIS — I480 Paroxysmal atrial fibrillation: Secondary | ICD-10-CM | POA: Diagnosis not present

## 2021-05-23 DIAGNOSIS — Z7901 Long term (current) use of anticoagulants: Secondary | ICD-10-CM | POA: Diagnosis not present

## 2021-05-23 DIAGNOSIS — Z8673 Personal history of transient ischemic attack (TIA), and cerebral infarction without residual deficits: Secondary | ICD-10-CM | POA: Diagnosis not present

## 2021-05-24 DIAGNOSIS — Z7901 Long term (current) use of anticoagulants: Secondary | ICD-10-CM | POA: Diagnosis not present

## 2021-05-29 DIAGNOSIS — Z7901 Long term (current) use of anticoagulants: Secondary | ICD-10-CM | POA: Diagnosis not present

## 2021-05-29 DIAGNOSIS — I480 Paroxysmal atrial fibrillation: Secondary | ICD-10-CM | POA: Diagnosis not present

## 2021-05-29 DIAGNOSIS — Z8673 Personal history of transient ischemic attack (TIA), and cerebral infarction without residual deficits: Secondary | ICD-10-CM | POA: Diagnosis not present

## 2021-05-30 DIAGNOSIS — Z8673 Personal history of transient ischemic attack (TIA), and cerebral infarction without residual deficits: Secondary | ICD-10-CM | POA: Diagnosis not present

## 2021-05-30 DIAGNOSIS — I4891 Unspecified atrial fibrillation: Secondary | ICD-10-CM | POA: Diagnosis not present

## 2021-05-30 DIAGNOSIS — Z7901 Long term (current) use of anticoagulants: Secondary | ICD-10-CM | POA: Diagnosis not present

## 2021-06-07 DIAGNOSIS — I110 Hypertensive heart disease with heart failure: Secondary | ICD-10-CM | POA: Diagnosis not present

## 2021-06-07 DIAGNOSIS — I739 Peripheral vascular disease, unspecified: Secondary | ICD-10-CM | POA: Diagnosis not present

## 2021-06-07 DIAGNOSIS — D6869 Other thrombophilia: Secondary | ICD-10-CM | POA: Diagnosis not present

## 2021-06-07 DIAGNOSIS — D84822 Immunodeficiency due to external causes: Secondary | ICD-10-CM | POA: Diagnosis not present

## 2021-06-07 DIAGNOSIS — R69 Illness, unspecified: Secondary | ICD-10-CM | POA: Diagnosis not present

## 2021-06-07 DIAGNOSIS — E785 Hyperlipidemia, unspecified: Secondary | ICD-10-CM | POA: Diagnosis not present

## 2021-06-07 DIAGNOSIS — I4891 Unspecified atrial fibrillation: Secondary | ICD-10-CM | POA: Diagnosis not present

## 2021-06-07 DIAGNOSIS — I509 Heart failure, unspecified: Secondary | ICD-10-CM | POA: Diagnosis not present

## 2021-06-07 DIAGNOSIS — E261 Secondary hyperaldosteronism: Secondary | ICD-10-CM | POA: Diagnosis not present

## 2021-06-07 DIAGNOSIS — Z008 Encounter for other general examination: Secondary | ICD-10-CM | POA: Diagnosis not present

## 2021-06-07 DIAGNOSIS — I25119 Atherosclerotic heart disease of native coronary artery with unspecified angina pectoris: Secondary | ICD-10-CM | POA: Diagnosis not present

## 2021-06-07 DIAGNOSIS — F419 Anxiety disorder, unspecified: Secondary | ICD-10-CM | POA: Diagnosis not present

## 2021-06-07 DIAGNOSIS — G8929 Other chronic pain: Secondary | ICD-10-CM | POA: Diagnosis not present

## 2021-06-16 ENCOUNTER — Other Ambulatory Visit: Payer: Self-pay | Admitting: Family Medicine

## 2021-06-16 DIAGNOSIS — F3341 Major depressive disorder, recurrent, in partial remission: Secondary | ICD-10-CM

## 2021-06-16 DIAGNOSIS — K922 Gastrointestinal hemorrhage, unspecified: Secondary | ICD-10-CM

## 2021-06-16 DIAGNOSIS — K219 Gastro-esophageal reflux disease without esophagitis: Secondary | ICD-10-CM

## 2021-06-16 NOTE — Telephone Encounter (Signed)
Requested Prescriptions  Pending Prescriptions Disp Refills  . omeprazole (PRILOSEC) 40 MG capsule [Pharmacy Med Name: OMEPRAZOLE DR 40 MG CAPSULE] 180 capsule 1    Sig: TAKE 1 CAPSULE BY MOUTH TWICE A DAY     Gastroenterology: Proton Pump Inhibitors Passed - 06/16/2021  2:16 AM      Passed - Valid encounter within last 12 months    Recent Outpatient Visits          5 months ago Familial multiple lipoprotein-type hyperlipidemia   [facility] Juline Patch, MD   1 year ago Recurrent major depressive disorder, in partial remission ([place])   [facility] Juline Patch, MD   1 year ago Acute non-recurrent maxillary sinusitis   [facility] Juline Patch, MD   1 year ago Cigarette nicotine dependence without complication   [facility] Juline Patch, MD   1 year ago Dental abscess   [place], [place]      Future Appointments            In 6 days Juline Patch, MD [facility], [place]           . buPROPion (WELLBUTRIN) 100 MG tablet [Pharmacy Med Name: BUPROPION HCL 100 MG TABLET] 180 tablet 1    Sig: TAKE 1 TABLET BY MOUTH TWICE A DAY     Psychiatry: Antidepressants - bupropion Passed - 06/16/2021  2:16 AM      Passed - Completed PHQ-2 or PHQ-9 in the last 360 days      Passed - Last BP in normal range    BP Readings from Last 1 Encounters:  12/20/20 120/64         Passed - Valid encounter within last 6 months    Recent Outpatient Visits          5 months ago Familial multiple lipoprotein-type hyperlipidemia   [facility] Juline Patch, MD   1 year ago Recurrent major depressive disorder, in partial remission ([place])   [facility] Juline Patch, MD   1 year ago Acute non-recurrent maxillary sinusitis   [facility] Juline Patch, MD   1 year ago Cigarette nicotine dependence without complication   [facility] Juline Patch, MD   1 year ago Dental  abscess   [place], MD      Future Appointments            In 6 days Juline Patch, MD [facility], [facility]

## 2021-06-19 DIAGNOSIS — Z8673 Personal history of transient ischemic attack (TIA), and cerebral infarction without residual deficits: Secondary | ICD-10-CM | POA: Diagnosis not present

## 2021-06-19 DIAGNOSIS — I480 Paroxysmal atrial fibrillation: Secondary | ICD-10-CM | POA: Diagnosis not present

## 2021-06-19 DIAGNOSIS — Z7901 Long term (current) use of anticoagulants: Secondary | ICD-10-CM | POA: Diagnosis not present

## 2021-06-20 DIAGNOSIS — Z8673 Personal history of transient ischemic attack (TIA), and cerebral infarction without residual deficits: Secondary | ICD-10-CM | POA: Diagnosis not present

## 2021-06-20 DIAGNOSIS — I255 Ischemic cardiomyopathy: Secondary | ICD-10-CM | POA: Diagnosis not present

## 2021-06-20 DIAGNOSIS — I4891 Unspecified atrial fibrillation: Secondary | ICD-10-CM | POA: Diagnosis not present

## 2021-06-20 DIAGNOSIS — I480 Paroxysmal atrial fibrillation: Secondary | ICD-10-CM | POA: Diagnosis not present

## 2021-06-20 DIAGNOSIS — Z7901 Long term (current) use of anticoagulants: Secondary | ICD-10-CM | POA: Diagnosis not present

## 2021-06-22 ENCOUNTER — Encounter: Payer: Self-pay | Admitting: Family Medicine

## 2021-06-22 ENCOUNTER — Other Ambulatory Visit: Payer: Self-pay

## 2021-06-22 ENCOUNTER — Ambulatory Visit (INDEPENDENT_AMBULATORY_CARE_PROVIDER_SITE_OTHER): Payer: Medicare HMO | Admitting: Family Medicine

## 2021-06-22 VITALS — BP 112/50 | HR 52 | Ht 67.0 in | Wt 165.0 lb

## 2021-06-22 DIAGNOSIS — F3341 Major depressive disorder, recurrent, in partial remission: Secondary | ICD-10-CM

## 2021-06-22 DIAGNOSIS — K219 Gastro-esophageal reflux disease without esophagitis: Secondary | ICD-10-CM | POA: Diagnosis not present

## 2021-06-22 DIAGNOSIS — E7849 Other hyperlipidemia: Secondary | ICD-10-CM

## 2021-06-22 DIAGNOSIS — Z23 Encounter for immunization: Secondary | ICD-10-CM

## 2021-06-22 DIAGNOSIS — R69 Illness, unspecified: Secondary | ICD-10-CM | POA: Diagnosis not present

## 2021-06-22 MED ORDER — BUPROPION HCL 100 MG PO TABS: 100.0000 mg | ORAL_TABLET | Freq: Two times a day (BID) | ORAL | 1 refills | Status: DC

## 2021-06-22 MED ORDER — OMEPRAZOLE 40 MG PO CPDR: 40.0000 mg | DELAYED_RELEASE_CAPSULE | Freq: Two times a day (BID) | ORAL | 1 refills | Status: DC

## 2021-06-22 MED ORDER — ATORVASTATIN CALCIUM 80 MG PO TABS: 80.0000 mg | ORAL_TABLET | Freq: Every day | ORAL | 1 refills | Status: DC

## 2021-06-22 NOTE — Progress Notes (Signed)
Date:  06/22/2021   Name:  Veronica Ellis   DOB:  November 12, 1933   MRN:  [id number]   Chief Complaint: Hyperlipidemia, Gastroesophageal Reflux, Depression, and Flu Vaccine  Hyperlipidemia This is a chronic problem. The current episode started more than 1 year ago. The problem is controlled. Recent lipid tests were reviewed and are normal. She has no history of chronic renal disease, diabetes, hypothyroidism, liver disease, obesity or nephrotic syndrome. There are no known factors aggravating her hyperlipidemia. Pertinent negatives include no chest pain, focal sensory loss, focal weakness, leg pain, myalgias or shortness of breath. Current antihyperlipidemic treatment includes statins. The current treatment provides moderate improvement of lipids. There are no compliance problems.  Risk factors for coronary artery disease include dyslipidemia and hypertension.  Gastroesophageal Reflux She reports no abdominal pain, no belching, no chest pain, no choking, no coughing, no dysphagia, no early satiety, no globus sensation, no heartburn, no hoarse voice, no nausea, no sore throat, no stridor, no tooth decay, no water brash or no wheezing. This is a chronic problem. The current episode started more than 1 year ago. The problem has been gradually improving. Pertinent negatives include no anemia, fatigue, melena, muscle weakness, orthopnea or weight loss. She has tried a PPI for the symptoms. The treatment provided mild relief.  Depression        This is a chronic problem.  The current episode started more than 1 year ago.   The problem occurs intermittently.  Associated symptoms include body aches.  Associated symptoms include no decreased concentration, no fatigue, no helplessness, no hopelessness, does not have insomnia, not irritable, no restlessness, no decreased interest, no appetite change, no myalgias, no headaches, no indigestion, not sad and no suicidal ideas.     The symptoms are aggravated by  nothing.  Past treatments include SSRIs - Selective serotonin reuptake inhibitors.   Pertinent negatives include no hypothyroidism.  Lab Results  Component Value Date   CREATININE 1.15 (H) 05/19/2020   BUN 18 05/19/2020   NA 138 05/19/2020   K 4.9 05/19/2020   CL 101 05/19/2020   CO2 25 05/19/2020   Lab Results  Component Value Date   CHOL 129 05/19/2020   HDL 52 05/19/2020   LDLCALC 56 05/19/2020   TRIG 120 05/19/2020   CHOLHDL 3.6 05/20/2018   Lab Results  Component Value Date   TSH 5.789 (H) 05/19/2018   Lab Results  Component Value Date   HGBA1C 5.4 05/19/2018   Lab Results  Component Value Date   WBC 8.9 03/04/2020   HGB 12.6 03/04/2020   HCT 38.1 03/04/2020   MCV 89.9 03/04/2020   PLT 284 03/04/2020   Lab Results  Component Value Date   ALT 27 03/04/2020   AST 22 03/04/2020   ALKPHOS 73 03/04/2020   BILITOT 1.3 (H) 03/04/2020     Review of Systems  Constitutional:  Negative for appetite change, chills, fatigue, fever and weight loss.  HENT:  Negative for drooling, ear discharge, ear pain, hoarse voice and sore throat.   Respiratory:  Negative for cough, choking, shortness of breath and wheezing.   Cardiovascular:  Negative for chest pain, palpitations and leg swelling.  Gastrointestinal:  Negative for abdominal pain, blood in stool, constipation, diarrhea, dysphagia, heartburn, melena and nausea.  Endocrine: Negative for polydipsia.  Genitourinary:  Negative for dysuria, frequency, hematuria and urgency.  Musculoskeletal:  Negative for back pain, myalgias, muscle weakness and neck pain.  Skin:  Negative for rash.  Allergic/Immunologic:  Negative for environmental allergies.  Neurological:  Negative for dizziness, focal weakness and headaches.  Hematological:  Negative for adenopathy. Does not bruise/bleed easily.  Psychiatric/Behavioral:  Positive for depression. Negative for decreased concentration and suicidal ideas. The patient is not nervous/anxious  and does not have insomnia.    Patient Active Problem List   Diagnosis Date Noted   GERD without esophagitis 08/31/2019   History of IBS 09/26/2018   Long term (current) use of anticoagulants 07/17/2018   3-vessel CAD 07/08/2018   Chronic combined systolic and diastolic CHF (congestive heart failure) ([place]) 07/08/2018   Ischemic cardiomyopathy 07/08/2018   AKI (acute kidney injury) ([place]) 06/06/2018   Hyperkalemia 06/05/2018   Atrial fibrillation with RVR ([place]) 05/21/2018   Microcytic anemia 05/21/2018   NSTEMI (non-ST elevated myocardial infarction) ([place]) 05/19/2018   Acute on chronic systolic heart failure ([place]) 07/30/2017   LBBB (left bundle branch block) 07/10/2017   Lumbar radiculopathy 05/16/2017   Hx of low back pain 05/14/2017   Ataxia 12/31/2016   Memory loss 12/31/2016   Recurrent major depressive disorder, in partial remission ([place]) 12/31/2016   Bipolar 1 disorder, mixed, moderate ([place]) 12/25/2016   Chronic bilateral low back pain with bilateral sciatica 10/18/2016   Bilateral breast cancer ([place]) 02/20/2016   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   Cerebrovascular disease 02/16/2015   Anxiety 02/16/2015   Breast lump 02/16/2015   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[place]) 02/16/2015   Tobacco abuse, in remission 02/16/2015   Routine general medical examination at a health care facility 02/16/2015   Recurrent major depressive episodes ([place]) 02/16/2015   Below normal amount of sodium in the blood 02/16/2015   Pre-operative cardiovascular examination 02/16/2015    Allergies  Allergen Reactions   Iodine Nausea Only    Betadine okay    Erythromycin Rash   Other Rash   Penicillins Rash    Has patient had a PCN reaction causing immediate rash, facial/tongue/throat swelling, SOB or lightheadedness with hypotension: No Has patient had a PCN reaction causing severe rash involving mucus membranes or skin necrosis: No Has patient had a PCN reaction that required  hospitalization: No Has patient had a PCN reaction occurring within the last 10 years: No If all of the above answers are "NO", then may proceed with Cephalosporin use.    Shellfish Allergy Rash    Past Surgical History:  Procedure Laterality Date   APPENDECTOMY     BREAST BIOPSY Bilateral 2014   BREAST LUMPECTOMY Bilateral 05/2013   BREAST SURGERY Bilateral    CATARACT EXTRACTION W/PHACO Right 12/11/2017   Procedure: CATARACT EXTRACTION PHACO AND INTRAOCULAR LENS PLACEMENT ([place]) COMPLICATED RIGHT;  Surgeon: Leandrew Koyanagi, MD;  Location: [facility];  Service: Ophthalmology;  Laterality: Right;   COLON SURGERY     12 in. removed due to polyps   LEFT HEART CATH AND CORONARY ANGIOGRAPHY N/A 05/20/2018   Procedure: LEFT HEART CATH AND CORONARY ANGIOGRAPHY;  Surgeon: Corey Skains, MD;  Location: [facility];  Service: Cardiovascular;  Laterality: N/A;   TUBAL LIGATION      Social History   Tobacco Use   Smoking status: Former    Packs/day: 1.00    Years: 60.00    Pack years: 60.00    Types: Cigarettes    Quit date: 12/2017    Years since quitting: 3.5   Smokeless tobacco: Never   Tobacco comments:    smoking cessation materials not required  Vaping Use   Vaping Use: Never used  Substance Use  Topics   Alcohol use: Not Currently    Alcohol/week: 0.0 standard drinks   Drug use: No     Medication list has been reviewed and updated.  Current Meds  Medication Sig   atorvastatin (LIPITOR) 80 MG tablet Take 1 tablet (80 mg total) by mouth daily.   buPROPion (WELLBUTRIN) 100 MG tablet TAKE 1 TABLET BY MOUTH TWICE A DAY   metoprolol succinate (TOPROL-XL) 25 MG 24 hr tablet Take 25 mg by mouth daily.   nitroGLYCERIN (NITROSTAT) 0.4 MG SL tablet Place 0.4 mg under the tongue every 5 (five) minutes as needed for chest pain.   omeprazole (PRILOSEC) 40 MG capsule TAKE 1 CAPSULE BY MOUTH TWICE A DAY   sacubitril-valsartan (ENTRESTO) 97-103 MG Take 0.5  tablets by mouth 2 (two) times daily. (Patient taking differently: Take 1 tablet by mouth 2 (two) times daily.)   spironolactone (ALDACTONE) 25 MG tablet Take 25 mg by mouth daily.   warfarin (COUMADIN) 3 MG tablet Take 3 mg by mouth See admin instructions. Take 1 tablet (3mg ) by mouth every Tuesday and Thursday    [facility] 2/9 Scores 06/22/2021 12/20/2020 07/06/2020 05/19/2020  PHQ - 2 Score 0 0 3 0  PHQ- 9 Score 3 2 12  0    GAD 7 : Generalized Anxiety Score 06/22/2021 12/20/2020 05/19/2020 01/21/2020  Nervous, Anxious, on Edge 1 0 1 0  Control/stop worrying 1 0 1 0  Worry too much - different things 1 0 1 0  Trouble relaxing 2 0 0 0  Restless 2 0 0 0  Easily annoyed or irritable 2 0 0 0  Afraid - awful might happen 0 0 0 0  Total GAD 7 Score 9 0 3 0  Anxiety Difficulty Not difficult at all - Not difficult at all -    BP Readings from Last 3 Encounters:  06/22/21 (!) 112/50  12/20/20 120/64  05/19/20 120/70    Physical Exam Vitals and nursing note reviewed.  Constitutional:      General: She is not irritable.She is not in acute distress.    Appearance: She is not diaphoretic.  HENT:     Head: Normocephalic and atraumatic.     Right Ear: External ear normal.     Left Ear: External ear normal.     Nose: Nose normal.  Eyes:     General:        Right eye: No discharge.        Left eye: No discharge.     Conjunctiva/sclera: Conjunctivae normal.     Pupils: Pupils are equal, round, and reactive to light.  Neck:     Thyroid: No thyromegaly.     Vascular: No JVD.  Cardiovascular:     Rate and Rhythm: Normal rate and regular rhythm.     Heart sounds: Normal heart sounds. No murmur heard.   No friction rub. No gallop.  Pulmonary:     Effort: Pulmonary effort is normal.     Breath sounds: Normal breath sounds.  Abdominal:     General: Bowel sounds are normal.     Palpations: Abdomen is soft. There is no mass.     Tenderness: There is no abdominal tenderness. There is no guarding.   Musculoskeletal:        General: Normal range of motion.     Cervical back: Normal range of motion and neck supple.  Lymphadenopathy:     Cervical: No cervical adenopathy.  Skin:    General: Skin is warm and  dry.  Neurological:     Mental Status: She is alert.     Deep Tendon Reflexes: Reflexes are normal and symmetric.    Wt Readings from Last 3 Encounters:  06/22/21 165 lb (74.8 kg)  12/20/20 165 lb (74.8 kg)  05/19/20 166 lb (75.3 kg)    BP (!) 112/50   Pulse (!) 52   Ht 5\' 7"  (1.702 m)   Wt 165 lb (74.8 kg)   BMI 25.84 kg/m   Assessment and Plan:  1. Familial multiple lipoprotein-type hyperlipidemia Chronic.  Controlled.  Stable.  Continue atorvastatin 80 mg once a day.  We will recheck lipid panel in 6 months. - atorvastatin (LIPITOR) 80 MG tablet; Take 1 tablet (80 mg total) by mouth daily.  Dispense: 90 tablet; Refill: 1  2. GERD without esophagitis Chronic.  Controlled.  Stable.  Continue omeprazole 40 mg 1 twice a day. - omeprazole (PRILOSEC) 40 MG capsule; Take 1 capsule (40 mg total) by mouth 2 (two) times daily.  Dispense: 180 capsule; Refill: 1  3. Recurrent major depressive disorder, in partial remission (HCC) Chronic.  Controlled.  Stable.  PHQ is 3 Gad score is 9.  Continue bupropion 100 mg twice a day. - buPROPion (WELLBUTRIN) 100 MG tablet; Take 1 tablet (100 mg total) by mouth 2 (two) times daily.  Dispense: 180 tablet; Refill: 1  4. Need for immunization against influenza Discussed and administered. - Flu Vaccine QUAD High Dose(Fluad)

## 2021-07-20 ENCOUNTER — Telehealth: Payer: Self-pay | Admitting: Family Medicine

## 2021-07-20 NOTE — Telephone Encounter (Signed)
Copied from [place] [id number]. Topic: Medicare AWV >> Jul 20, 2021  9:55 AM Cher Nakai R wrote: Reason for CRM:  Left message for patient to call back and schedule Medicare Annual Wellness Visit (AWV) in office.   If unable to come into the office for AWV,  please offer to do virtually or by telephone.  Last AWV: 07/06/2020  Please schedule at anytime with [facility] Health Advisor.      40 Minutes appointment   Any questions, please call me at [phone]

## 2021-07-31 DIAGNOSIS — I5023 Acute on chronic systolic (congestive) heart failure: Secondary | ICD-10-CM | POA: Diagnosis not present

## 2021-07-31 DIAGNOSIS — I4891 Unspecified atrial fibrillation: Secondary | ICD-10-CM | POA: Diagnosis not present

## 2021-07-31 DIAGNOSIS — I480 Paroxysmal atrial fibrillation: Secondary | ICD-10-CM | POA: Diagnosis not present

## 2021-07-31 DIAGNOSIS — I5042 Chronic combined systolic (congestive) and diastolic (congestive) heart failure: Secondary | ICD-10-CM | POA: Diagnosis not present

## 2021-07-31 DIAGNOSIS — Z7901 Long term (current) use of anticoagulants: Secondary | ICD-10-CM | POA: Diagnosis not present

## 2021-07-31 DIAGNOSIS — I429 Cardiomyopathy, unspecified: Secondary | ICD-10-CM | POA: Diagnosis not present

## 2021-08-18 DIAGNOSIS — H353131 Nonexudative age-related macular degeneration, bilateral, early dry stage: Secondary | ICD-10-CM | POA: Diagnosis not present

## 2021-09-12 DIAGNOSIS — Z0100 Encounter for examination of eyes and vision without abnormal findings: Secondary | ICD-10-CM | POA: Diagnosis not present

## 2021-09-29 ENCOUNTER — Other Ambulatory Visit: Payer: Self-pay

## 2021-09-29 DIAGNOSIS — Z7901 Long term (current) use of anticoagulants: Secondary | ICD-10-CM | POA: Diagnosis not present

## 2021-09-29 DIAGNOSIS — I480 Paroxysmal atrial fibrillation: Secondary | ICD-10-CM | POA: Diagnosis not present

## 2021-09-29 DIAGNOSIS — K219 Gastro-esophageal reflux disease without esophagitis: Secondary | ICD-10-CM

## 2021-09-29 DIAGNOSIS — I4891 Unspecified atrial fibrillation: Secondary | ICD-10-CM | POA: Diagnosis not present

## 2021-09-29 DIAGNOSIS — I429 Cardiomyopathy, unspecified: Secondary | ICD-10-CM | POA: Diagnosis not present

## 2021-09-29 DIAGNOSIS — I5023 Acute on chronic systolic (congestive) heart failure: Secondary | ICD-10-CM | POA: Diagnosis not present

## 2021-09-29 DIAGNOSIS — I5042 Chronic combined systolic (congestive) and diastolic (congestive) heart failure: Secondary | ICD-10-CM | POA: Diagnosis not present

## 2021-10-03 DIAGNOSIS — Z7901 Long term (current) use of anticoagulants: Secondary | ICD-10-CM | POA: Diagnosis not present

## 2021-10-03 DIAGNOSIS — I429 Cardiomyopathy, unspecified: Secondary | ICD-10-CM | POA: Diagnosis not present

## 2021-10-03 DIAGNOSIS — I480 Paroxysmal atrial fibrillation: Secondary | ICD-10-CM | POA: Diagnosis not present

## 2021-10-03 DIAGNOSIS — Z8673 Personal history of transient ischemic attack (TIA), and cerebral infarction without residual deficits: Secondary | ICD-10-CM | POA: Diagnosis not present

## 2021-10-03 DIAGNOSIS — I4891 Unspecified atrial fibrillation: Secondary | ICD-10-CM | POA: Diagnosis not present

## 2021-10-30 ENCOUNTER — Telehealth: Payer: Medicare HMO | Admitting: Family Medicine

## 2021-10-30 ENCOUNTER — Ambulatory Visit: Payer: Self-pay | Admitting: *Deleted

## 2021-10-30 ENCOUNTER — Telehealth: Payer: Self-pay

## 2021-10-30 NOTE — Telephone Encounter (Signed)
Diarrhea for 4 days   Chief Complaint: diarrhea Symptoms: diarrhea Frequency: 7xd Pertinent Negatives: Patient denies no blood Disposition: [] ED /[] Urgent Care (no appt availability in office) / [] Appointment(In office/virtual)/ []  [facility]/ [] Home Care/ [] Refused Recommended Disposition /[] [place] [facility]/ []  Follow-up with PCP Additional Notes: visit scheduled.  Reason for Disposition  [1] SEVERE diarrhea (e.g., 7 or more times / day more than normal) AND [2] present > 24 hours (1 day)  Answer Assessment - Initial Assessment Questions 1. DIARRHEA SEVERITY: "How bad is the diarrhea?" "How many more stools have you had in the past 24 hours than normal?"    - NO DIARRHEA (SCALE 0)   - MILD (SCALE 1-3): Few loose or mushy BMs; increase of 1-3 stools over normal daily number of stools; mild increase in ostomy output.   -  MODERATE (SCALE 4-7): Increase of 4-6 stools daily over normal; moderate increase in ostomy output. * SEVERE (SCALE 8-10; OR 'WORST POSSIBLE'): Increase of 7 or more stools daily over normal; moderate increase in ostomy output; incontinence.     moderate 2. ONSET: "When did the diarrhea begin?"      3-4 days ago 3. BM CONSISTENCY: "How loose or watery is the diarrhea?"      yes 4. VOMITING: "Are you also vomiting?" If Yes, ask: "How many times in the past 24 hours?"      no 5. ABDOMINAL PAIN: "Are you having any abdominal pain?" If Yes, ask: "What does it feel like?" (e.g., crampy, dull, intermittent, constant)      Not now but did before 6. ABDOMINAL PAIN SEVERITY: If present, ask: "How bad is the pain?"  (e.g., Scale 1-10; mild, moderate, or severe)   - MILD (1-3): doesn't interfere with normal activities, abdomen soft and not tender to touch    - MODERATE (4-7): interferes with normal activities or awakens from sleep, abdomen tender to touch    - SEVERE (8-10): excruciating pain, doubled over, unable to do any normal activities        Moderate  7. ORAL INTAKE: If vomiting, "Have you been able to drink liquids?" "How much liquids have you had in the past 24 hours?"    drinking 8. HYDRATION: "Any signs of dehydration?" (e.g., dry mouth [not just dry lips], too weak to stand, dizziness, new weight loss) "When did you last urinate?"     Yes, getting better 9. EXPOSURE: "Have you traveled to a foreign country recently?" "Have you been exposed to anyone with diarrhea?" "Could you have eaten any food that was spoiled?"     Na  10. ANTIBIOTIC USE: "Are you taking antibiotics now or have you taken antibiotics in the past 2 months?"       na 11. OTHER SYMPTOMS: "Do you have any other symptoms?" (e.g., fever, blood in stool)       na 12. PREGNANCY: "Is there any chance you are pregnant?" "When was your last menstrual period?"       Na  Protocols used: [facility]

## 2021-10-30 NOTE — Telephone Encounter (Signed)
Pt is c/o diarrhea and "IBS." I explained to son she needs to take Imodium otc and go to a clear liquid diet. I also advised him to get her back in with GI at [facility], in which I gave him the number for them.

## 2021-11-15 ENCOUNTER — Telehealth: Payer: Self-pay | Admitting: Family Medicine

## 2021-11-15 NOTE — Telephone Encounter (Signed)
Copied from [place] [id number]. Topic: Medicare AWV ?>> Nov 15, 2021 10:26 AM Cher Nakai R wrote: ?Reason for CRM:  ?Left message for patient to call back and schedule Medicare Annual Wellness Visit (AWV) in office.  ? ?If unable to come into the office for AWV,  please offer to do virtually or by telephone. ? ?Last AWV: 07/06/2020 ? ?Please schedule at anytime with [facility] Health Advisor.     ? ?40 Minutes appointment  ? ?Any questions, please call me at [phone] ?

## 2021-12-12 ENCOUNTER — Telehealth: Payer: Self-pay | Admitting: Family Medicine

## 2021-12-12 NOTE — Telephone Encounter (Signed)
Copied from [place] [id number]. Topic: Medicare AWV ?>> Dec 12, 2021 12:13 PM Cher Nakai R wrote: ?Reason for CRM:  ?Left message for patient to call back and schedule Medicare Annual Wellness Visit (AWV) in office.  ? ?If unable to come into the office for AWV,  please offer to do virtually or by telephone. ? ?Last AWV: 07/06/2020 ? ?Please schedule at anytime with [facility] Health Advisor.     ? ?30 minute appointment for Virtual or phone ?45 minute appointment for in office or Initial virtual/phone ? ?Any questions, please call me at [phone] ?

## 2021-12-21 ENCOUNTER — Ambulatory Visit (INDEPENDENT_AMBULATORY_CARE_PROVIDER_SITE_OTHER): Payer: Medicare HMO | Admitting: Family Medicine

## 2021-12-21 ENCOUNTER — Encounter: Payer: Self-pay | Admitting: Family Medicine

## 2021-12-21 ENCOUNTER — Ambulatory Visit
Admission: RE | Admit: 2021-12-21 | Discharge: 2021-12-21 | Disposition: A | Discharge status: Home / Self Care | Payer: Medicare HMO | Attending: Family Medicine | Admitting: Family Medicine

## 2021-12-21 ENCOUNTER — Ambulatory Visit
Admission: RE | Admit: 2021-12-21 | Discharge: 2021-12-21 | Disposition: A | Discharge status: Home / Self Care | Payer: Medicare HMO | Source: Ambulatory Visit | Attending: Family Medicine | Admitting: Family Medicine

## 2021-12-21 VITALS — BP 128/82 | HR 64 | Ht 67.0 in | Wt 166.0 lb

## 2021-12-21 DIAGNOSIS — M545 Low back pain, unspecified: Secondary | ICD-10-CM | POA: Insufficient documentation

## 2021-12-21 DIAGNOSIS — E7849 Other hyperlipidemia: Secondary | ICD-10-CM

## 2021-12-21 DIAGNOSIS — K219 Gastro-esophageal reflux disease without esophagitis: Secondary | ICD-10-CM

## 2021-12-21 DIAGNOSIS — R197 Diarrhea, unspecified: Secondary | ICD-10-CM

## 2021-12-21 DIAGNOSIS — F3341 Major depressive disorder, recurrent, in partial remission: Secondary | ICD-10-CM | POA: Diagnosis not present

## 2021-12-21 DIAGNOSIS — G8929 Other chronic pain: Secondary | ICD-10-CM | POA: Insufficient documentation

## 2021-12-21 DIAGNOSIS — R1314 Dysphagia, pharyngoesophageal phase: Secondary | ICD-10-CM | POA: Diagnosis not present

## 2021-12-21 MED ORDER — ATORVASTATIN CALCIUM 80 MG PO TABS: 80.0000 mg | ORAL_TABLET | Freq: Every day | ORAL | 1 refills | Status: DC

## 2021-12-21 MED ORDER — OMEPRAZOLE 40 MG PO CPDR: 40.0000 mg | DELAYED_RELEASE_CAPSULE | Freq: Two times a day (BID) | ORAL | 1 refills | Status: DC

## 2021-12-21 MED ORDER — BUPROPION HCL 100 MG PO TABS: 100.0000 mg | ORAL_TABLET | Freq: Two times a day (BID) | ORAL | 1 refills | Status: DC

## 2021-12-21 NOTE — Progress Notes (Signed)
? ? ?Date:  12/21/2021  ? ?Name:  Veronica Ellis   DOB:  April 08, 1934   MRN:  [id number] ? ? ?Chief Complaint: Gastroesophageal Reflux, Depression, and Hyperlipidemia ? ?Gastroesophageal Reflux ?She reports no abdominal pain, no belching, no chest pain, no choking, no coughing, no dysphagia, no early satiety, no globus sensation, no heartburn, no hoarse voice, no nausea, no sore throat, no stridor, no tooth decay, no water brash or no wheezing. This is a recurrent problem. The current episode started 1 to 4 weeks ago. Nothing aggravates the symptoms. Pertinent negatives include no anemia, fatigue, melena, muscle weakness, orthopnea or weight loss. She has tried a PPI for the symptoms. The treatment provided moderate relief.  ?Depression ?       This is a chronic problem.  The onset quality is sudden.   The problem occurs intermittently.  Associated symptoms include no fatigue, no myalgias, no headaches and no suicidal ideas.  Past treatments include SSRIs - Selective serotonin reuptake inhibitors.  Compliance with treatment is variable.  Previous treatment provided moderate relief.   Pertinent negatives include no hypothyroidism. ?Hyperlipidemia ?This is a chronic problem. The current episode started more than 1 year ago. The problem is controlled. She has no history of hypothyroidism. Pertinent negatives include no chest pain, myalgias or shortness of breath. The current treatment provides moderate improvement of lipids. Risk factors for coronary artery disease include hypertension.  ? ?Lab Results  ?Component Value Date  ? NA 138 05/19/2020  ? K 4.9 05/19/2020  ? CO2 25 05/19/2020  ? GLUCOSE 87 05/19/2020  ? BUN 18 05/19/2020  ? CREATININE 1.15 (H) 05/19/2020  ? CALCIUM 9.1 05/19/2020  ? GFRNONAA 44 (L) 05/19/2020  ? ?Lab Results  ?Component Value Date  ? CHOL 129 05/19/2020  ? HDL 52 05/19/2020  ? [place] 56 05/19/2020  ? TRIG 120 05/19/2020  ? CHOLHDL 3.6 05/20/2018  ? ?Lab Results  ?Component Value Date  ? TSH  5.789 (H) 05/19/2018  ? ?Lab Results  ?Component Value Date  ? HGBA1C 5.4 05/19/2018  ? ?Lab Results  ?Component Value Date  ? WBC 8.9 03/04/2020  ? HGB 12.6 03/04/2020  ? HCT 38.1 03/04/2020  ? MCV 89.9 03/04/2020  ? PLT 284 03/04/2020  ? ?Lab Results  ?Component Value Date  ? ALT 27 03/04/2020  ? AST 22 03/04/2020  ? ALKPHOS 73 03/04/2020  ? BILITOT 1.3 (H) 03/04/2020  ? ?No results found for: 25OHVITD2, [place], VD25OH  ? ?Review of Systems  ?Constitutional:  Negative for chills, fatigue, fever and weight loss.  ?HENT:  Negative for drooling, ear discharge, ear pain, hoarse voice and sore throat.   ?Respiratory:  Negative for cough, choking, shortness of breath and wheezing.   ?Cardiovascular:  Negative for chest pain, palpitations and leg swelling.  ?Gastrointestinal:  Negative for abdominal pain, blood in stool, constipation, diarrhea, dysphagia, heartburn, melena and nausea.  ?Endocrine: Negative for polydipsia.  ?Genitourinary:  Negative for dysuria, frequency, hematuria and urgency.  ?Musculoskeletal:  Negative for back pain, myalgias, muscle weakness and neck pain.  ?Skin:  Negative for rash.  ?Allergic/Immunologic: Negative for environmental allergies.  ?Neurological:  Negative for dizziness and headaches.  ?Hematological:  Does not bruise/bleed easily.  ?Psychiatric/Behavioral:  Positive for depression. Negative for suicidal ideas. The patient is not nervous/anxious.   ? ?Patient Active Problem List  ? Diagnosis Date Noted  ? GERD without esophagitis 08/31/2019  ? History of IBS 09/26/2018  ? Long term (current) use of anticoagulants 07/17/2018  ?  3-vessel CAD 07/08/2018  ? Chronic combined systolic and diastolic CHF (congestive heart failure) ([place]) 07/08/2018  ? Ischemic cardiomyopathy 07/08/2018  ? AKI (acute kidney injury) ([place]) 06/06/2018  ? Hyperkalemia 06/05/2018  ? Atrial fibrillation with RVR ([place]) 05/21/2018  ? Microcytic anemia 05/21/2018  ? NSTEMI (non-ST elevated myocardial infarction) ([place])  05/19/2018  ? Acute on chronic systolic heart failure ([place]) 07/30/2017  ? LBBB (left bundle branch block) 07/10/2017  ? Lumbar radiculopathy 05/16/2017  ? Hx of low back pain 05/14/2017  ? Ataxia 12/31/2016  ? Memory loss 12/31/2016  ? Recurrent major depressive disorder, in partial remission ([place]) 12/31/2016  ? Bipolar 1 disorder, mixed, moderate ([place]) 12/25/2016  ? Chronic bilateral low back pain with bilateral sciatica 10/18/2016  ? Bilateral breast cancer ([place]) 02/20/2016  ? 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  ? Cerebrovascular disease 02/16/2015  ? Anxiety 02/16/2015  ? Breast lump 02/16/2015  ? 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[place]) 02/16/2015  ? Tobacco abuse, in remission 02/16/2015  ? Routine general medical examination at a health care facility 02/16/2015  ? Recurrent major depressive episodes ([place]) 02/16/2015  ? Below normal amount of sodium in the blood 02/16/2015  ? Pre-operative cardiovascular examination 02/16/2015  ? ? ?Allergies  ?Allergen Reactions  ? Iodine Nausea Only  ?  Betadine okay ?  ? Erythromycin Rash  ? Other Rash  ? Penicillins Rash  ?  Has patient had a PCN reaction causing immediate rash, facial/tongue/throat swelling, SOB or lightheadedness with hypotension: No ?Has patient had a PCN reaction causing severe rash involving mucus membranes or skin necrosis: No ?Has patient had a PCN reaction that required hospitalization: No ?Has patient had a PCN reaction occurring within the last 10 years: No ?If all of the above answers are "NO", then may proceed with Cephalosporin use. ?  ? Shellfish Allergy Rash  ? ? ?Past Surgical History:  ?Procedure Laterality Date  ? APPENDECTOMY    ? BREAST BIOPSY Bilateral 2014  ? BREAST LUMPECTOMY Bilateral 05/2013  ? BREAST SURGERY Bilateral   ? CATARACT EXTRACTION W/PHACO Right 12/11/2017  ? Procedure: CATARACT EXTRACTION PHACO AND INTRAOCULAR LENS PLACEMENT ([place]) COMPLICATED RIGHT;  Surgeon: Leandrew Koyanagi, MD;  Location: [place];  Service: Ophthalmology;  Laterality: Right;  ? COLON SURGERY    ? 12 in. removed due to polyps  ? LEFT HEART CATH AND CORONARY ANGIOGRAPHY N/A 05/20/2018  ? Procedure: LEFT HEART CATH AND CORONARY ANGIOGRAPHY;  Surgeon: Corey Skains, MD;  Location: [facility];  Service: Cardiovascular;  Laterality: N/A;  ? TUBAL LIGATION    ? ? ?Social History  ? ?Tobacco Use  ? Smoking status: Former  ?  Packs/day: 1.00  ?  Years: 60.00  ?  Pack years: 60.00  ?  Types: Cigarettes  ?  Quit date: 12/2017  ?  Years since quitting: 4.0  ? Smokeless tobacco: Never  ? Tobacco comments:  ?  smoking cessation materials not required  ?Vaping Use  ? Vaping Use: Never used  ?Substance Use Topics  ? Alcohol use: Not Currently  ?  Alcohol/week: 0.0 standard drinks  ? Drug use: No  ? ? ? ?Medication list has been reviewed and updated. ? ?Current Meds  ?Medication Sig  ? atorvastatin (LIPITOR) 80 MG tablet Take 1 tablet (80 mg total) by mouth daily.  ? buPROPion (WELLBUTRIN) 100 MG tablet Take 1 tablet (100 mg total) by mouth 2 (two) times daily.  ? metoprolol succinate (TOPROL-XL) 25 MG 24 hr tablet Take  25 mg by mouth daily.  ? nitroGLYCERIN (NITROSTAT) 0.4 MG SL tablet Place 0.4 mg under the tongue every 5 (five) minutes as needed for chest pain.  ? omeprazole (PRILOSEC) 40 MG capsule Take 1 capsule (40 mg total) by mouth 2 (two) times daily.  ? sacubitril-valsartan (ENTRESTO) 97-103 MG Take 0.5 tablets by mouth 2 (two) times daily. (Patient taking differently: Take 1 tablet by mouth 2 (two) times daily.)  ? spironolactone (ALDACTONE) 25 MG tablet Take 25 mg by mouth daily.  ? warfarin (COUMADIN) 3 MG tablet Take 3 mg by mouth See admin instructions. Take 1 tablet ('3mg'$ ) by mouth every Tuesday and Thursday  ? warfarin (COUMADIN) 4 MG tablet Take 4 mg by mouth See admin instructions. Take 1 tablet ('4mg'$ ) by mouth every Monday, Wednesday, Friday, Saturday and Sunday  ? ? ? ?  12/21/2021  ?  1:17 PM 06/22/2021  ?  1:21  PM 12/20/2020  ?  1:26 PM 05/19/2020  ?  1:49 PM  ?GAD 7 : Generalized Anxiety Score  ?Nervous, Anxious, on Edge 1 1 0 1  ?Control/stop worrying 0 1 0 1  ?Worry too much - different things 0 1 0 1  ?Trouble re

## 2021-12-25 ENCOUNTER — Other Ambulatory Visit: Payer: Self-pay

## 2021-12-25 DIAGNOSIS — E7800 Pure hypercholesterolemia, unspecified: Secondary | ICD-10-CM | POA: Diagnosis not present

## 2021-12-25 DIAGNOSIS — K8020 Calculus of gallbladder without cholecystitis without obstruction: Secondary | ICD-10-CM

## 2021-12-25 DIAGNOSIS — I480 Paroxysmal atrial fibrillation: Secondary | ICD-10-CM | POA: Diagnosis not present

## 2021-12-25 DIAGNOSIS — I4891 Unspecified atrial fibrillation: Secondary | ICD-10-CM | POA: Diagnosis not present

## 2021-12-25 DIAGNOSIS — Z7901 Long term (current) use of anticoagulants: Secondary | ICD-10-CM | POA: Diagnosis not present

## 2021-12-25 DIAGNOSIS — M545 Low back pain, unspecified: Secondary | ICD-10-CM

## 2021-12-25 DIAGNOSIS — K219 Gastro-esophageal reflux disease without esophagitis: Secondary | ICD-10-CM | POA: Diagnosis not present

## 2021-12-25 DIAGNOSIS — I5042 Chronic combined systolic (congestive) and diastolic (congestive) heart failure: Secondary | ICD-10-CM | POA: Diagnosis not present

## 2021-12-25 DIAGNOSIS — I2510 Atherosclerotic heart disease of native coronary artery without angina pectoris: Secondary | ICD-10-CM | POA: Diagnosis not present

## 2021-12-25 DIAGNOSIS — I255 Ischemic cardiomyopathy: Secondary | ICD-10-CM | POA: Diagnosis not present

## 2021-12-25 DIAGNOSIS — Z8673 Personal history of transient ischemic attack (TIA), and cerebral infarction without residual deficits: Secondary | ICD-10-CM | POA: Diagnosis not present

## 2022-01-03 ENCOUNTER — Ambulatory Visit (INDEPENDENT_AMBULATORY_CARE_PROVIDER_SITE_OTHER): Payer: Medicare HMO

## 2022-01-03 DIAGNOSIS — Z0000 Encounter for general adult medical examination without abnormal findings: Secondary | ICD-10-CM

## 2022-01-03 DIAGNOSIS — I4891 Unspecified atrial fibrillation: Secondary | ICD-10-CM | POA: Diagnosis not present

## 2022-01-03 NOTE — Progress Notes (Signed)
? ?Subjective:  ? Veronica Ellis is a 88 y.o. female who presents for Medicare Annual (Subsequent) preventive examination. ? ?Virtual Visit via Telephone Note ? ?I connected with  Veronica Ellis on 01/03/22 at  3:45 PM EDT by telephone and verified that I am speaking with the correct person using two identifiers. ? ?Location: ?Patient: home ?Provider: [facility] ?Persons participating in the virtual visit: patient & son Veronica Ellis Health Advisor ?  ?I discussed the limitations, risks, security and privacy concerns of performing an evaluation and management service by telephone and the availability of in person appointments. The patient expressed understanding and agreed to proceed. ? ?Interactive audio and video telecommunications were attempted between this nurse and patient, however failed, due to patient having technical difficulties OR patient did not have access to video capability.  We continued and completed visit with audio only. ? ?Some vital signs may be absent or patient reported.  ? ?Clemetine Marker, LPN ? ? ?Review of Systems    ?Cardiac Risk Factors include: advanced age (>37mn, >>67women);dyslipidemia;sedentary lifestyle ? ?   ?Objective:  ?  ?There were no vitals filed for this visit. ?There is no height or weight on file to calculate BMI. ? ? ?  01/03/2022  ?  3:41 PM 07/06/2020  ? 10:34 AM 03/04/2020  ?  1:19 PM 10/01/2019  ?  9:18 AM 04/17/2019  ?  2:32 PM 09/26/2018  ? 10:54 AM 09/26/2018  ? 10:53 AM  ?Advanced Directives  ?Does Patient Have a Medical Advance Directive? No No No No No No No  ?Would patient like information on creating a medical advance directive? No - Patient declined No - Patient declined No - Patient declined No - Patient declined No - Patient declined No - Patient declined No - Patient declined  ? ? ?Current Medications (verified) ?Outpatient Encounter Medications as of 01/03/2022  ?Medication Sig  ? atorvastatin (LIPITOR) 80 MG tablet Take 1 tablet (80 mg total) by mouth daily.  ?  buPROPion (WELLBUTRIN) 100 MG tablet Take 1 tablet (100 mg total) by mouth 2 (two) times daily.  ? ezetimibe (ZETIA) 10 MG tablet Take by mouth. Take 1 tablet (10 mg total) by mouth once daily  ? gabapentin (NEURONTIN) 300 MG capsule Take 1 capsule by mouth 2 (two) times daily.  ? magnesium oxide (MAG-OX) 400 MG tablet Take 1 tablet by mouth daily.  ? metoprolol succinate (TOPROL-XL) 25 MG 24 hr tablet Take 25 mg by mouth daily.  ? nitroGLYCERIN (NITROSTAT) 0.4 MG SL tablet Place 0.4 mg under the tongue every 5 (five) minutes as needed for chest pain.  ? omeprazole (PRILOSEC) 40 MG capsule Take 1 capsule (40 mg total) by mouth 2 (two) times daily.  ? sacubitril-valsartan (ENTRESTO) 97-103 MG Take 0.5 tablets by mouth 2 (two) times daily. (Patient taking differently: Take 1 tablet by mouth 2 (two) times daily.)  ? spironolactone (ALDACTONE) 25 MG tablet Take 25 mg by mouth daily.  ? torsemide (DEMADEX) 10 MG tablet Take by mouth. Take 1 tablet (10 mg total) by mouth once daily as needed (take if weight increases by 2lbs in 1 day or 5lbs in 1 week from 150lbs.)  ? warfarin (COUMADIN) 3 MG tablet Take 1 tablet by mouth daily.  ? [DISCONTINUED] warfarin (COUMADIN) 3 MG tablet Take 3 mg by mouth See admin instructions. Take 1 tablet ('3mg'$ ) by mouth every Tuesday and Thursday  ? [DISCONTINUED] warfarin (COUMADIN) 4 MG tablet Take 4 mg by mouth See admin  instructions. Take 1 tablet ('4mg'$ ) by mouth every Monday, Wednesday, Friday, Saturday and Sunday  ? ?No facility-administered encounter medications on file as of 01/03/2022.  ? ? ?Allergies (verified) ?Iodine, Erythromycin, Other, Penicillins, and Shellfish allergy  ? ?History: ?Past Medical History:  ?Diagnosis Date  ? Anxiety   ? Anxiety   ? Arthritis   ? Breast cancer ([place]) 2014  ? bilateral invasive mammary carcinoma. with 36 rad tx.   ? Breast cancer ([place])   ? Cancer [facility])   ? breast  ? Depression   ? Depression   ? Hard of hearing   ? Hyperlipemia   ? Hypertension   ?  IBS (irritable bowel syndrome)   ? Memory loss   ? Osteoporosis   ? Personal history of radiation therapy   ? Wears dentures   ? full upper, partial lower  ? ?Past Surgical History:  ?Procedure Laterality Date  ? APPENDECTOMY    ? BREAST BIOPSY Bilateral 2014  ? BREAST LUMPECTOMY Bilateral 05/2013  ? BREAST SURGERY Bilateral   ? CATARACT EXTRACTION W/PHACO Right 12/11/2017  ? Procedure: CATARACT EXTRACTION PHACO AND INTRAOCULAR LENS PLACEMENT ([place]) COMPLICATED RIGHT;  Surgeon: Leandrew Koyanagi, MD;  Location: [place];  Service: Ophthalmology;  Laterality: Right;  ? COLON SURGERY    ? 12 in. removed due to polyps  ? LEFT HEART CATH AND CORONARY ANGIOGRAPHY N/A 05/20/2018  ? Procedure: LEFT HEART CATH AND CORONARY ANGIOGRAPHY;  Surgeon: Corey Skains, MD;  Location: [place] [facility];  Service: Cardiovascular;  Laterality: N/A;  ? TUBAL LIGATION    ? ?Family History  ?Problem Relation Age of Onset  ? Breast cancer Daughter 5  ? Breast cancer Daughter 13  ? Cirrhosis Mother   ? Lung cancer Father   ? ?Social History  ? ?Socioeconomic History  ? Marital status: Widowed  ?  Spouse name: Not on file  ? Number of children: 5  ? Years of education: some college  ? Highest education level: 12th grade  ?Occupational History  ? Occupation: Retired  ?Tobacco Use  ? Smoking status: Former  ?  Packs/day: 1.00  ?  Years: 60.00  ?  Pack years: 60.00  ?  Types: Cigarettes  ?  Quit date: 12/2017  ?  Years since quitting: 4.0  ? Smokeless tobacco: Never  ? Tobacco comments:  ?  smoking cessation materials not required  ?Vaping Use  ? Vaping Use: Never used  ?Substance and Sexual Activity  ? Alcohol use: Not Currently  ?  Alcohol/week: 0.0 standard drinks  ? Drug use: No  ? Sexual activity: Yes  ?Other Topics Concern  ? Not on file  ?Social History Narrative  ? Pt's son Veronica lives with her  ? ?Social Determinants of Health  ? ?Financial Resource Strain: Low Risk   ? Difficulty of Paying Living Expenses: Not very hard   ?Food Insecurity: No Food Insecurity  ? Worried About Charity fundraiser in the Last Year: Never true  ? Ran Out of Food in the Last Year: Never true  ?Transportation Needs: No Transportation Needs  ? Lack of Transportation (Medical): No  ? Lack of Transportation (Non-Medical): No  ?Physical Activity: Inactive  ? Days of Exercise per Week: 0 days  ? Minutes of Exercise per Session: 0 min  ?Stress: No Stress Concern Present  ? Feeling of Stress : Only a little  ?Social Connections: Socially Isolated  ? Frequency of Communication with Friends and Family: More than  three times a week  ? Frequency of Social Gatherings with Friends and Family: Three times a week  ? Attends Religious Services: Never  ? Active Member of Clubs or Organizations: No  ? Attends Archivist Meetings: Never  ? Marital Status: Widowed  ? ? ?Tobacco Counseling ?Counseling given: Not Answered ?Tobacco comments: smoking cessation materials not required ? ? ?Clinical Intake: ? ?Pre-visit preparation completed: Yes ? ?Pain : No/denies pain ? ?  ? ?Nutritional Risks: None ?Diabetes: No ? ?How often do you need to have someone help you when you read instructions, pamphlets, or other written materials from your doctor or pharmacy?: 4 - Often ? ? ? ?Interpreter Needed?: No ? ?Information entered by :: Clemetine Marker LPN ? ? ?Activities of Daily Living ? ?  01/03/2022  ?  4:01 PM  ?In your present state of health, do you have any difficulty performing the following activities:  ?Hearing? 1  ?Vision? 0  ?Difficulty concentrating or making decisions? 1  ?Walking or climbing stairs? 1  ?Dressing or bathing? 0  ?Doing errands, shopping? 1  ?Preparing Food and eating ? N  ?Using the Toilet? N  ?Do you have problems with loss of bowel control? N  ?Managing your Medications? Y  ?Managing your Finances? Y  ?Housekeeping or managing your Housekeeping? N  ? ? ?Patient Care Team: ?Juline Patch, MD as PCP - General (Family Medicine) ? ?Indicate any recent  Medical Services you may have received from other than [facility] providers in the past year (date may be approximate). ? ?   ?Assessment:  ? This is a routine wellness examination for Ambera. ? ?Hearing/Vision

## 2022-01-03 NOTE — Patient Instructions (Signed)
Veronica Ellis , ?Thank you for taking time to come for your Medicare Wellness Visit. I appreciate your ongoing commitment to your health goals. Please review the following plan we discussed and let me know if I can assist you in the future.  ? ?Screening recommendations/referrals: ?Colonoscopy: no longer required ?Mammogram: no longer required ?Bone Density: no longer required ?Recommended yearly ophthalmology/optometry visit for glaucoma screening and checkup ?Recommended yearly dental visit for hygiene and checkup ? ?Vaccinations: ?Influenza vaccine: done 06/22/21 ?Pneumococcal vaccine: declined ?Tdap vaccine: due ?Shingles vaccine: Shingrix discussed. Please contact your pharmacy for coverage information.  ?Covid-19:declined ? ?Advanced directives: Advance directive discussed with you today. Even though you declined this today please call our office should you change your mind and we can give you the proper paperwork for you to fill out.  ? ?Conditions/risks identified: recommend continuing fall prevention at home ? ?Next appointment: Follow up in one year for your annual wellness visit. ? ? ?Preventive Care 37 Years and Older, Female ?Preventive care refers to lifestyle choices and visits with your health care provider that can promote health and wellness. ?What does preventive care include? ?A yearly physical exam. This is also called an annual well check. ?Dental exams once or twice a year. ?Routine eye exams. Ask your health care provider how often you should have your eyes checked. ?Personal lifestyle choices, including: ?Daily care of your teeth and gums. ?Regular physical activity. ?Eating a healthy diet. ?Avoiding tobacco and drug use. ?Limiting alcohol use. ?Practicing safe sex. ?Taking low-dose aspirin every day. ?Taking vitamin and mineral supplements as recommended by your health care provider. ?What happens during an annual well check? ?The services and screenings done by your health care provider  during your annual well check will depend on your age, overall health, lifestyle risk factors, and family history of disease. ?Counseling  ?Your health care provider may ask you questions about your: ?Alcohol use. ?Tobacco use. ?Drug use. ?Emotional well-being. ?Home and relationship well-being. ?Sexual activity. ?Eating habits. ?History of falls. ?Memory and ability to understand (cognition). ?Work and work Statistician. ?Reproductive health. ?Screening  ?You may have the following tests or measurements: ?Height, weight, and BMI. ?Blood pressure. ?Lipid and cholesterol levels. These may be checked every 5 years, or more frequently if you are over 37 years old. ?Skin check. ?Lung cancer screening. You may have this screening every year starting at age 31 if you have a 30-pack-year history of smoking and currently smoke or have quit within the past 15 years. ?Fecal occult blood test (FOBT) of the stool. You may have this test every year starting at age 88. ?Flexible sigmoidoscopy or colonoscopy. You may have a sigmoidoscopy every 5 years or a colonoscopy every 10 years starting at age 33. ?Hepatitis C blood test. ?Hepatitis B blood test. ?Sexually transmitted disease (STD) testing. ?Diabetes screening. This is done by checking your blood sugar (glucose) after you have not eaten for a while (fasting). You may have this done every 1-3 years. ?Bone density scan. This is done to screen for osteoporosis. You may have this done starting at age 16. ?Mammogram. This may be done every 1-2 years. Talk to your health care provider about how often you should have regular mammograms. ?Talk with your health care provider about your test results, treatment options, and if necessary, the need for more tests. ?Vaccines  ?Your health care provider may recommend certain vaccines, such as: ?Influenza vaccine. This is recommended every year. ?Tetanus, diphtheria, and acellular pertussis (Tdap, Td) vaccine. You  may need a Td booster every  10 years. ?Zoster vaccine. You may need this after age 27. ?Pneumococcal 13-valent conjugate (PCV13) vaccine. One dose is recommended after age 1. ?Pneumococcal polysaccharide (PPSV23) vaccine. One dose is recommended after age 41. ?Talk to your health care provider about which screenings and vaccines you need and how often you need them. ?This information is not intended to replace advice given to you by your health care provider. Make sure you discuss any questions you have with your health care provider. ?Document Released: 09/23/2015 Document Revised: 05/16/2016 Document Reviewed: 06/28/2015 ?Elsevier Interactive Patient Education ? 2017 [place]. ? ?Fall Prevention in the Home ?Falls can cause injuries. They can happen to people of all ages. There are many things you can do to make your home safe and to help prevent falls. ?What can I do on the outside of my home? ?Regularly fix the edges of walkways and driveways and fix any cracks. ?Remove anything that might make you trip as you walk through a door, such as a raised step or threshold. ?Trim any bushes or trees on the path to your home. ?Use bright outdoor lighting. ?Clear any walking paths of anything that might make someone trip, such as rocks or tools. ?Regularly check to see if handrails are loose or broken. Make sure that both sides of any steps have handrails. ?Any raised decks and porches should have guardrails on the edges. ?Have any leaves, snow, or ice cleared regularly. ?Use sand or salt on walking paths during winter. ?Clean up any spills in your garage right away. This includes oil or grease spills. ?What can I do in the bathroom? ?Use night lights. ?Install grab bars by the toilet and in the tub and shower. Do not use towel bars as grab bars. ?Use non-skid mats or decals in the tub or shower. ?If you need to sit down in the shower, use a plastic, non-slip stool. ?Keep the floor dry. Clean up any water that spills on the floor as soon as it  happens. ?Remove soap buildup in the tub or shower regularly. ?Attach bath mats securely with double-sided non-slip rug tape. ?Do not have throw rugs and other things on the floor that can make you trip. ?What can I do in the bedroom? ?Use night lights. ?Make sure that you have a light by your bed that is easy to reach. ?Do not use any sheets or blankets that are too big for your bed. They should not hang down onto the floor. ?Have a firm chair that has side arms. You can use this for support while you get dressed. ?Do not have throw rugs and other things on the floor that can make you trip. ?What can I do in the kitchen? ?Clean up any spills right away. ?Avoid walking on wet floors. ?Keep items that you use a lot in easy-to-reach places. ?If you need to reach something above you, use a strong step stool that has a grab bar. ?Keep electrical cords out of the way. ?Do not use floor polish or wax that makes floors slippery. If you must use wax, use non-skid floor wax. ?Do not have throw rugs and other things on the floor that can make you trip. ?What can I do with my stairs? ?Do not leave any items on the stairs. ?Make sure that there are handrails on both sides of the stairs and use them. Fix handrails that are broken or loose. Make sure that handrails are as long as the  stairways. ?Check any carpeting to make sure that it is firmly attached to the stairs. Fix any carpet that is loose or worn. ?Avoid having throw rugs at the top or bottom of the stairs. If you do have throw rugs, attach them to the floor with carpet tape. ?Make sure that you have a light switch at the top of the stairs and the bottom of the stairs. If you do not have them, ask someone to add them for you. ?What else can I do to help prevent falls? ?Wear shoes that: ?Do not have high heels. ?Have rubber bottoms. ?Are comfortable and fit you well. ?Are closed at the toe. Do not wear sandals. ?If you use a stepladder: ?Make sure that it is fully opened.  Do not climb a closed stepladder. ?Make sure that both sides of the stepladder are locked into place. ?Ask someone to hold it for you, if possible. ?Clearly mark and make sure that you can see: ?Any grab

## 2022-01-04 DIAGNOSIS — Z7901 Long term (current) use of anticoagulants: Secondary | ICD-10-CM | POA: Diagnosis not present

## 2022-01-08 ENCOUNTER — Ambulatory Visit: Payer: Self-pay | Admitting: Surgery

## 2022-01-16 ENCOUNTER — Encounter: Payer: Self-pay | Admitting: Family Medicine

## 2022-01-16 ENCOUNTER — Ambulatory Visit (INDEPENDENT_AMBULATORY_CARE_PROVIDER_SITE_OTHER): Payer: Medicare HMO | Admitting: Family Medicine

## 2022-01-16 VITALS — BP 120/60 | HR 68 | Ht 67.0 in | Wt 163.0 lb

## 2022-01-16 DIAGNOSIS — R42 Dizziness and giddiness: Secondary | ICD-10-CM

## 2022-01-16 NOTE — Progress Notes (Signed)
? ? ?Date:  01/16/2022  ? ?Name:  Veronica Ellis   DOB:  Oct 15, 1933   MRN:  [id number] ? ? ?Chief Complaint: Dizziness (Unsteady gait- ref neuro) ? ?Dizziness ?This is a new problem. The current episode started in the past 7 days (worse over last 3). The problem occurs constantly. The problem has been gradually worsening. Pertinent negatives include no abdominal pain, chest pain, chills, coughing, fever, headaches, myalgias, nausea, neck pain, rash, sore throat or vertigo. She has tried nothing for the symptoms.  ? ?Lab Results  ?Component Value Date  ? NA 138 05/19/2020  ? K 4.9 05/19/2020  ? CO2 25 05/19/2020  ? GLUCOSE 87 05/19/2020  ? BUN 18 05/19/2020  ? CREATININE 1.15 (H) 05/19/2020  ? CALCIUM 9.1 05/19/2020  ? GFRNONAA 44 (L) 05/19/2020  ? ?Lab Results  ?Component Value Date  ? CHOL 129 05/19/2020  ? HDL 52 05/19/2020  ? [place] 56 05/19/2020  ? TRIG 120 05/19/2020  ? CHOLHDL 3.6 05/20/2018  ? ?Lab Results  ?Component Value Date  ? TSH 5.789 (H) 05/19/2018  ? ?Lab Results  ?Component Value Date  ? HGBA1C 5.4 05/19/2018  ? ?Lab Results  ?Component Value Date  ? WBC 8.9 03/04/2020  ? HGB 12.6 03/04/2020  ? HCT 38.1 03/04/2020  ? MCV 89.9 03/04/2020  ? PLT 284 03/04/2020  ? ?Lab Results  ?Component Value Date  ? ALT 27 03/04/2020  ? AST 22 03/04/2020  ? ALKPHOS 73 03/04/2020  ? BILITOT 1.3 (H) 03/04/2020  ? ?No results found for: 25OHVITD2, [place], VD25OH  ? ?Review of Systems  ?Constitutional:  Negative for chills and fever.  ?HENT:  Negative for drooling, ear discharge, ear pain and sore throat.   ?Respiratory:  Negative for cough, shortness of breath and wheezing.   ?Cardiovascular:  Negative for chest pain, palpitations and leg swelling.  ?Gastrointestinal:  Negative for abdominal pain, blood in stool, constipation, diarrhea and nausea.  ?Endocrine: Negative for polydipsia.  ?Genitourinary:  Negative for dysuria, frequency, hematuria and urgency.  ?Musculoskeletal:  Negative for back pain, myalgias and  neck pain.  ?Skin:  Negative for rash.  ?Allergic/Immunologic: Negative for environmental allergies.  ?Neurological:  Positive for dizziness. Negative for vertigo and headaches.  ?Hematological:  Does not bruise/bleed easily.  ?Psychiatric/Behavioral:  Negative for suicidal ideas. The patient is not nervous/anxious.   ? ?Patient Active Problem List  ? Diagnosis Date Noted  ? GERD without esophagitis 08/31/2019  ? History of IBS 09/26/2018  ? Long term (current) use of anticoagulants 07/17/2018  ? 3-vessel CAD 07/08/2018  ? Chronic combined systolic and diastolic CHF (congestive heart failure) ([place]) 07/08/2018  ? Ischemic cardiomyopathy 07/08/2018  ? AKI (acute kidney injury) ([place]) 06/06/2018  ? Hyperkalemia 06/05/2018  ? Atrial fibrillation with RVR ([place]) 05/21/2018  ? Microcytic anemia 05/21/2018  ? NSTEMI (non-ST elevated myocardial infarction) ([place]) 05/19/2018  ? Acute on chronic systolic heart failure ([place]) 07/30/2017  ? LBBB (left bundle branch block) 07/10/2017  ? Lumbar radiculopathy 05/16/2017  ? Hx of low back pain 05/14/2017  ? Ataxia 12/31/2016  ? Memory loss 12/31/2016  ? Recurrent major depressive disorder, in partial remission ([place]) 12/31/2016  ? Bipolar 1 disorder, mixed, moderate ([place]) 12/25/2016  ? Chronic bilateral low back pain with bilateral sciatica 10/18/2016  ? Bilateral breast cancer ([place]) 02/20/2016  ? 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  ? Cerebrovascular disease 02/16/2015  ? Anxiety 02/16/2015  ? Breast lump 02/16/2015  ? 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[place]) 02/16/2015  ?  Tobacco abuse, in remission 02/16/2015  ? Routine general medical examination at a health care facility 02/16/2015  ? Recurrent major depressive episodes ([place]) 02/16/2015  ? Below normal amount of sodium in the blood 02/16/2015  ? Pre-operative cardiovascular examination 02/16/2015  ? ? ?Allergies  ?Allergen Reactions  ? Iodine Nausea Only  ?  Betadine okay ?  ? Erythromycin Rash  ? Other Rash  ?  Penicillins Rash  ?  Has patient had a PCN reaction causing immediate rash, facial/tongue/throat swelling, SOB or lightheadedness with hypotension: No ?Has patient had a PCN reaction causing severe rash involving mucus membranes or skin necrosis: No ?Has patient had a PCN reaction that required hospitalization: No ?Has patient had a PCN reaction occurring within the last 10 years: No ?If all of the above answers are "NO", then may proceed with Cephalosporin use. ?  ? Shellfish Allergy Rash  ? ? ?Past Surgical History:  ?Procedure Laterality Date  ? APPENDECTOMY    ? BREAST BIOPSY Bilateral 2014  ? BREAST LUMPECTOMY Bilateral 05/2013  ? BREAST SURGERY Bilateral   ? CATARACT EXTRACTION W/PHACO Right 12/11/2017  ? Procedure: CATARACT EXTRACTION PHACO AND INTRAOCULAR LENS PLACEMENT ([facility]) COMPLICATED RIGHT;  Surgeon: Leandrew Koyanagi, MD;  Location: [facility];  Service: Ophthalmology;  Laterality: Right;  ? COLON SURGERY    ? 12 in. removed due to polyps  ? LEFT HEART CATH AND CORONARY ANGIOGRAPHY N/A 05/20/2018  ? Procedure: LEFT HEART CATH AND CORONARY ANGIOGRAPHY;  Surgeon: Corey Skains, MD;  Location: [facility];  Service: Cardiovascular;  Laterality: N/A;  ? TUBAL LIGATION    ? ? ?Social History  ? ?Tobacco Use  ? Smoking status: Former  ?  Packs/day: 1.00  ?  Years: 60.00  ?  Pack years: 60.00  ?  Types: Cigarettes  ?  Quit date: 12/2017  ?  Years since quitting: 4.1  ? Smokeless tobacco: Never  ? Tobacco comments:  ?  smoking cessation materials not required  ?Vaping Use  ? Vaping Use: Never used  ?Substance Use Topics  ? Alcohol use: Not Currently  ?  Alcohol/week: 0.0 standard drinks  ? Drug use: No  ? ? ? ?Medication list has been reviewed and updated. ? ?Current Meds  ?Medication Sig  ? atorvastatin (LIPITOR) 80 MG tablet Take 1 tablet (80 mg total) by mouth daily.  ? buPROPion (WELLBUTRIN) 100 MG tablet Take 1 tablet (100 mg total) by mouth 2 (two) times daily.  ? ezetimibe (ZETIA)  10 MG tablet Take by mouth. Take 1 tablet (10 mg total) by mouth once daily  ? gabapentin (NEURONTIN) 300 MG capsule Take 1 capsule by mouth 2 (two) times daily.  ? magnesium oxide (MAG-OX) 400 MG tablet Take 1 tablet by mouth daily.  ? metoprolol succinate (TOPROL-XL) 25 MG 24 hr tablet Take 25 mg by mouth daily.  ? nitroGLYCERIN (NITROSTAT) 0.4 MG SL tablet Place 0.4 mg under the tongue every 5 (five) minutes as needed for chest pain.  ? omeprazole (PRILOSEC) 40 MG capsule Take 1 capsule (40 mg total) by mouth 2 (two) times daily.  ? spironolactone (ALDACTONE) 25 MG tablet Take 25 mg by mouth daily.  ? torsemide (DEMADEX) 10 MG tablet Take by mouth. Take 1 tablet (10 mg total) by mouth once daily as needed (take if weight increases by 2lbs in 1 day or 5lbs in 1 week from 150lbs.)  ? warfarin (COUMADIN) 3 MG tablet Take 1 tablet by mouth daily.  ? ? ? ?  12/21/2021  ?  1:17 PM 06/22/2021  ?  1:21 PM 12/20/2020  ?  1:26 PM 05/19/2020  ?  1:49 PM  ?GAD 7 : Generalized Anxiety Score  ?Nervous, Anxious, on Edge 1 1 0 1  ?Control/stop worrying 0 1 0 1  ?Worry too much - different things 0 1 0 1  ?Trouble relaxing 0 2 0 0  ?Restless 0 2 0 0  ?Easily annoyed or irritable 0 2 0 0  ?Afraid - awful might happen 0 0 0 0  ?Total GAD 7 Score 1 9 0 3  ?Anxiety Difficulty Not difficult at all Not difficult at all  Not difficult at all  ? ? ? ?  01/03/2022  ?  3:40 PM  ?Depression screen PHQ 2/9  ?Decreased Interest 2  ?Down, Depressed, Hopeless 2  ?PHQ - 2 Score 4  ?Altered sleeping 2  ?Tired, decreased energy 0  ?Change in appetite 0  ?Feeling bad or failure about yourself  0  ?Trouble concentrating 0  ?Moving slowly or fidgety/restless 0  ?Suicidal thoughts 0  ?PHQ-9 Score 6  ?Difficult doing work/chores Somewhat difficult  ? ? ?BP Readings from Last 3 Encounters:  ?01/16/22 120/60  ?12/21/21 128/82  ?06/22/21 (!) 112/50  ? ? ?Physical Exam ?Vitals and nursing note reviewed.  ?Constitutional:   ?   Appearance: She is well-developed.   ?HENT:  ?   Head: Normocephalic.  ?   Right Ear: Tympanic membrane, ear canal and external ear normal.  ?   Left Ear: Tympanic membrane, ear canal and external ear normal.  ?   Nose: Nose normal. No congest

## 2022-01-17 ENCOUNTER — Ambulatory Visit (INDEPENDENT_AMBULATORY_CARE_PROVIDER_SITE_OTHER): Payer: Medicare HMO | Admitting: Family Medicine

## 2022-01-17 ENCOUNTER — Encounter: Payer: Self-pay | Admitting: Family Medicine

## 2022-01-17 VITALS — BP 120/70 | HR 68 | Ht 67.0 in | Wt 164.0 lb

## 2022-01-17 DIAGNOSIS — I9589 Other hypotension: Secondary | ICD-10-CM

## 2022-01-17 DIAGNOSIS — I4891 Unspecified atrial fibrillation: Secondary | ICD-10-CM | POA: Diagnosis not present

## 2022-01-17 DIAGNOSIS — E861 Hypovolemia: Secondary | ICD-10-CM

## 2022-01-17 NOTE — Progress Notes (Signed)
? ? ?Date:  01/17/2022  ? ?Name:  Veronica Ellis   DOB:  May 12, 1934   MRN:  [id number] ? ? ?Chief Complaint: Follow-up (Stopped torsemide and entresto. I spoke to [facility] this am and Dr Jalene Mullet is going to call son to follow up with what to do on the meds) ? ?Patient is a 88 year old female who presents for a follow up hypotension exam. The patient reports the following problems: "better". Health maintenance has been reviewed up to date. ?  ? ? ?Lab Results  ?Component Value Date  ? NA 138 05/19/2020  ? K 4.9 05/19/2020  ? CO2 25 05/19/2020  ? GLUCOSE 87 05/19/2020  ? BUN 18 05/19/2020  ? CREATININE 1.15 (H) 05/19/2020  ? CALCIUM 9.1 05/19/2020  ? GFRNONAA 44 (L) 05/19/2020  ? ?Lab Results  ?Component Value Date  ? CHOL 129 05/19/2020  ? HDL 52 05/19/2020  ? [place] 56 05/19/2020  ? TRIG 120 05/19/2020  ? CHOLHDL 3.6 05/20/2018  ? ?Lab Results  ?Component Value Date  ? TSH 5.789 (H) 05/19/2018  ? ?Lab Results  ?Component Value Date  ? HGBA1C 5.4 05/19/2018  ? ?Lab Results  ?Component Value Date  ? WBC 8.9 03/04/2020  ? HGB 12.6 03/04/2020  ? HCT 38.1 03/04/2020  ? MCV 89.9 03/04/2020  ? PLT 284 03/04/2020  ? ?Lab Results  ?Component Value Date  ? ALT 27 03/04/2020  ? AST 22 03/04/2020  ? ALKPHOS 73 03/04/2020  ? BILITOT 1.3 (H) 03/04/2020  ? ?No results found for: 25OHVITD2, [place], VD25OH  ? ?Review of Systems  ?Constitutional:  Negative for chills and fever.  ?HENT:  Negative for drooling, ear discharge, ear pain and sore throat.   ?Respiratory:  Negative for cough, shortness of breath and wheezing.   ?Cardiovascular:  Negative for chest pain, palpitations and leg swelling.  ?Gastrointestinal:  Negative for abdominal pain, blood in stool, constipation, diarrhea and nausea.  ?Endocrine: Negative for polydipsia.  ?Genitourinary:  Negative for dysuria, frequency, hematuria and urgency.  ?Musculoskeletal:  Negative for back pain, myalgias and neck pain.  ?Skin:  Negative for rash.  ?Allergic/Immunologic: Negative  for environmental allergies.  ?Neurological:  Negative for dizziness and headaches.  ?Hematological:  Does not bruise/bleed easily.  ?Psychiatric/Behavioral:  Negative for suicidal ideas. The patient is not nervous/anxious.   ? ?Patient Active Problem List  ? Diagnosis Date Noted  ? GERD without esophagitis 08/31/2019  ? History of IBS 09/26/2018  ? Long term (current) use of anticoagulants 07/17/2018  ? 3-vessel CAD 07/08/2018  ? Chronic combined systolic and diastolic CHF (congestive heart failure) ([place]) 07/08/2018  ? Ischemic cardiomyopathy 07/08/2018  ? AKI (acute kidney injury) ([place]) 06/06/2018  ? Hyperkalemia 06/05/2018  ? Atrial fibrillation with RVR ([place]) 05/21/2018  ? Microcytic anemia 05/21/2018  ? NSTEMI (non-ST elevated myocardial infarction) ([place]) 05/19/2018  ? Acute on chronic systolic heart failure ([place]) 07/30/2017  ? LBBB (left bundle branch block) 07/10/2017  ? Lumbar radiculopathy 05/16/2017  ? Hx of low back pain 05/14/2017  ? Ataxia 12/31/2016  ? Memory loss 12/31/2016  ? Recurrent major depressive disorder, in partial remission ([place]) 12/31/2016  ? Bipolar 1 disorder, mixed, moderate ([place]) 12/25/2016  ? Chronic bilateral low back pain with bilateral sciatica 10/18/2016  ? Bilateral breast cancer ([place]) 02/20/2016  ? 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  ? Cerebrovascular disease 02/16/2015  ? Anxiety 02/16/2015  ? Breast lump 02/16/2015  ? 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[place]) 02/16/2015  ? Tobacco abuse, in remission  02/16/2015  ? Routine general medical examination at a health care facility 02/16/2015  ? Recurrent major depressive episodes ([place]) 02/16/2015  ? Below normal amount of sodium in the blood 02/16/2015  ? Pre-operative cardiovascular examination 02/16/2015  ? ? ?Allergies  ?Allergen Reactions  ? Iodine Nausea Only  ?  Betadine okay ?  ? Erythromycin Rash  ? Other Rash  ? Penicillins Rash  ?  Has patient had a PCN reaction causing immediate rash, facial/tongue/throat  swelling, SOB or lightheadedness with hypotension: No ?Has patient had a PCN reaction causing severe rash involving mucus membranes or skin necrosis: No ?Has patient had a PCN reaction that required hospitalization: No ?Has patient had a PCN reaction occurring within the last 10 years: No ?If all of the above answers are "NO", then may proceed with Cephalosporin use. ?  ? Shellfish Allergy Rash  ? ? ?Past Surgical History:  ?Procedure Laterality Date  ? APPENDECTOMY    ? BREAST BIOPSY Bilateral 2014  ? BREAST LUMPECTOMY Bilateral 05/2013  ? BREAST SURGERY Bilateral   ? CATARACT EXTRACTION W/PHACO Right 12/11/2017  ? Procedure: CATARACT EXTRACTION PHACO AND INTRAOCULAR LENS PLACEMENT ([place]) COMPLICATED RIGHT;  Surgeon: Leandrew Koyanagi, MD;  Location: [facility];  Service: Ophthalmology;  Laterality: Right;  ? COLON SURGERY    ? 12 in. removed due to polyps  ? LEFT HEART CATH AND CORONARY ANGIOGRAPHY N/A 05/20/2018  ? Procedure: LEFT HEART CATH AND CORONARY ANGIOGRAPHY;  Surgeon: Corey Skains, MD;  Location: [facility];  Service: Cardiovascular;  Laterality: N/A;  ? TUBAL LIGATION    ? ? ?Social History  ? ?Tobacco Use  ? Smoking status: Former  ?  Packs/day: 1.00  ?  Years: 60.00  ?  Pack years: 60.00  ?  Types: Cigarettes  ?  Quit date: 12/2017  ?  Years since quitting: 4.1  ? Smokeless tobacco: Never  ? Tobacco comments:  ?  smoking cessation materials not required  ?Vaping Use  ? Vaping Use: Never used  ?Substance Use Topics  ? Alcohol use: Not Currently  ?  Alcohol/week: 0.0 standard drinks  ? Drug use: No  ? ? ? ?Medication list has been reviewed and updated. ? ?No outpatient medications have been marked as taking for the 01/17/22 encounter (Office Visit) with Juline Patch, MD.  ? ? ? ?  12/21/2021  ?  1:17 PM 06/22/2021  ?  1:21 PM 12/20/2020  ?  1:26 PM 05/19/2020  ?  1:49 PM  ?GAD 7 : Generalized Anxiety Score  ?Nervous, Anxious, on Edge 1 1 0 1  ?Control/stop worrying 0 1 0 1  ?Worry  too much - different things 0 1 0 1  ?Trouble relaxing 0 2 0 0  ?Restless 0 2 0 0  ?Easily annoyed or irritable 0 2 0 0  ?Afraid - awful might happen 0 0 0 0  ?Total GAD 7 Score 1 9 0 3  ?Anxiety Difficulty Not difficult at all Not difficult at all  Not difficult at all  ? ? ? ?  01/03/2022  ?  3:40 PM  ?Depression screen PHQ 2/9  ?Decreased Interest 2  ?Down, Depressed, Hopeless 2  ?PHQ - 2 Score 4  ?Altered sleeping 2  ?Tired, decreased energy 0  ?Change in appetite 0  ?Feeling bad or failure about yourself  0  ?Trouble concentrating 0  ?Moving slowly or fidgety/restless 0  ?Suicidal thoughts 0  ?PHQ-9 Score 6  ?Difficult doing work/chores  Somewhat difficult  ? ? ?BP Readings from Last 3 Encounters:  ?01/17/22 120/70  ?01/16/22 120/60  ?12/21/21 128/82  ? ? ?Physical Exam ?Vitals and nursing note reviewed.  ?Constitutional:   ?   Appearance: She is well-developed.  ?HENT:  ?   Head: Normocephalic.  ?   Right Ear: Tympanic membrane and external ear normal.  ?   Left Ear: Tympanic membrane and external ear normal.  ?   Nose: Nose normal.  ?Eyes:  ?   General: Lids are everted, no foreign bodies appreciated. No scleral icterus.    ?   Left eye: No foreign body or hordeolum.  ?   Conjunctiva/sclera: Conjunctivae normal.  ?   Right eye: Right conjunctiva is not injected.  ?   Left eye: Left conjunctiva is not injected.  ?   Pupils: Pupils are equal, round, and reactive to light.  ?Neck:  ?   Thyroid: No thyromegaly.  ?   Vascular: No JVD.  ?   Trachea: No tracheal deviation.  ?Cardiovascular:  ?   Rate and Rhythm: Normal rate and regular rhythm.  ?   Heart sounds: Normal heart sounds, S1 normal and S2 normal. No murmur heard. ?No systolic murmur is present.  ?No diastolic murmur is present.  ?  No friction rub. No gallop. No S3 or S4 sounds.  ?Pulmonary:  ?   Effort: Pulmonary effort is normal. No respiratory distress.  ?   Breath sounds: Normal breath sounds. No wheezing or rales.  ?Abdominal:  ?   General: Bowel  sounds are normal.  ?   Palpations: Abdomen is soft. There is no mass.  ?   Tenderness: There is no abdominal tenderness. There is no guarding or rebound.  ?Musculoskeletal:     ?   General: No tenderness.  ?

## 2022-01-18 DIAGNOSIS — Z7901 Long term (current) use of anticoagulants: Secondary | ICD-10-CM | POA: Diagnosis not present

## 2022-01-22 DIAGNOSIS — I429 Cardiomyopathy, unspecified: Secondary | ICD-10-CM | POA: Diagnosis not present

## 2022-01-22 DIAGNOSIS — I10 Essential (primary) hypertension: Secondary | ICD-10-CM | POA: Diagnosis not present

## 2022-01-22 DIAGNOSIS — I4891 Unspecified atrial fibrillation: Secondary | ICD-10-CM | POA: Diagnosis not present

## 2022-01-22 DIAGNOSIS — I2510 Atherosclerotic heart disease of native coronary artery without angina pectoris: Secondary | ICD-10-CM | POA: Diagnosis not present

## 2022-01-29 ENCOUNTER — Ambulatory Visit: Payer: Medicare HMO | Admitting: Surgery

## 2022-02-08 ENCOUNTER — Inpatient Hospital Stay
Admission: EM | Admit: 2022-02-08 | Discharge: 2022-02-13 | DRG: 378 | Disposition: A | Discharge status: Home Health | Payer: Medicare HMO | Attending: Obstetrics and Gynecology | Admitting: Obstetrics and Gynecology

## 2022-02-08 ENCOUNTER — Encounter: Payer: Self-pay | Admitting: *Deleted

## 2022-02-08 ENCOUNTER — Emergency Department: Payer: Medicare HMO

## 2022-02-08 ENCOUNTER — Other Ambulatory Visit: Payer: Self-pay

## 2022-02-08 DIAGNOSIS — I959 Hypotension, unspecified: Secondary | ICD-10-CM | POA: Diagnosis not present

## 2022-02-08 DIAGNOSIS — D123 Benign neoplasm of transverse colon: Secondary | ICD-10-CM | POA: Diagnosis not present

## 2022-02-08 DIAGNOSIS — I5022 Chronic systolic (congestive) heart failure: Secondary | ICD-10-CM | POA: Diagnosis not present

## 2022-02-08 DIAGNOSIS — D62 Acute posthemorrhagic anemia: Secondary | ICD-10-CM | POA: Diagnosis not present

## 2022-02-08 DIAGNOSIS — I2510 Atherosclerotic heart disease of native coronary artery without angina pectoris: Secondary | ICD-10-CM | POA: Diagnosis not present

## 2022-02-08 DIAGNOSIS — D649 Anemia, unspecified: Secondary | ICD-10-CM | POA: Diagnosis not present

## 2022-02-08 DIAGNOSIS — F419 Anxiety disorder, unspecified: Secondary | ICD-10-CM | POA: Diagnosis present

## 2022-02-08 DIAGNOSIS — R791 Abnormal coagulation profile: Secondary | ICD-10-CM

## 2022-02-08 DIAGNOSIS — J439 Emphysema, unspecified: Secondary | ICD-10-CM | POA: Diagnosis present

## 2022-02-08 DIAGNOSIS — M1990 Unspecified osteoarthritis, unspecified site: Secondary | ICD-10-CM | POA: Diagnosis not present

## 2022-02-08 DIAGNOSIS — Z803 Family history of malignant neoplasm of breast: Secondary | ICD-10-CM

## 2022-02-08 DIAGNOSIS — R42 Dizziness and giddiness: Secondary | ICD-10-CM | POA: Diagnosis not present

## 2022-02-08 DIAGNOSIS — N390 Urinary tract infection, site not specified: Secondary | ICD-10-CM | POA: Diagnosis present

## 2022-02-08 DIAGNOSIS — N189 Chronic kidney disease, unspecified: Secondary | ICD-10-CM | POA: Diagnosis not present

## 2022-02-08 DIAGNOSIS — Z136 Encounter for screening for cardiovascular disorders: Secondary | ICD-10-CM | POA: Diagnosis not present

## 2022-02-08 DIAGNOSIS — Z91013 Allergy to seafood: Secondary | ICD-10-CM | POA: Diagnosis not present

## 2022-02-08 DIAGNOSIS — I679 Cerebrovascular disease, unspecified: Secondary | ICD-10-CM | POA: Diagnosis present

## 2022-02-08 DIAGNOSIS — I5042 Chronic combined systolic (congestive) and diastolic (congestive) heart failure: Secondary | ICD-10-CM | POA: Diagnosis present

## 2022-02-08 DIAGNOSIS — I130 Hypertensive heart and chronic kidney disease with heart failure and stage 1 through stage 4 chronic kidney disease, or unspecified chronic kidney disease: Secondary | ICD-10-CM | POA: Diagnosis present

## 2022-02-08 DIAGNOSIS — N179 Acute kidney failure, unspecified: Secondary | ICD-10-CM | POA: Diagnosis not present

## 2022-02-08 DIAGNOSIS — N1832 Chronic kidney disease, stage 3b: Secondary | ICD-10-CM | POA: Insufficient documentation

## 2022-02-08 DIAGNOSIS — Z853 Personal history of malignant neoplasm of breast: Secondary | ICD-10-CM | POA: Diagnosis not present

## 2022-02-08 DIAGNOSIS — I480 Paroxysmal atrial fibrillation: Secondary | ICD-10-CM

## 2022-02-08 DIAGNOSIS — K922 Gastrointestinal hemorrhage, unspecified: Secondary | ICD-10-CM | POA: Diagnosis not present

## 2022-02-08 DIAGNOSIS — Z87891 Personal history of nicotine dependence: Secondary | ICD-10-CM

## 2022-02-08 DIAGNOSIS — K921 Melena: Principal | ICD-10-CM | POA: Diagnosis present

## 2022-02-08 DIAGNOSIS — D6832 Hemorrhagic disorder due to extrinsic circulating anticoagulants: Secondary | ICD-10-CM | POA: Diagnosis not present

## 2022-02-08 DIAGNOSIS — K5900 Constipation, unspecified: Secondary | ICD-10-CM | POA: Diagnosis present

## 2022-02-08 DIAGNOSIS — N1831 Chronic kidney disease, stage 3a: Secondary | ICD-10-CM | POA: Insufficient documentation

## 2022-02-08 DIAGNOSIS — Z923 Personal history of irradiation: Secondary | ICD-10-CM

## 2022-02-08 DIAGNOSIS — Z880 Allergy status to penicillin: Secondary | ICD-10-CM | POA: Diagnosis not present

## 2022-02-08 DIAGNOSIS — D689 Coagulation defect, unspecified: Secondary | ICD-10-CM

## 2022-02-08 DIAGNOSIS — M810 Age-related osteoporosis without current pathological fracture: Secondary | ICD-10-CM | POA: Diagnosis present

## 2022-02-08 DIAGNOSIS — I10 Essential (primary) hypertension: Secondary | ICD-10-CM | POA: Diagnosis not present

## 2022-02-08 DIAGNOSIS — Z888 Allergy status to other drugs, medicaments and biological substances status: Secondary | ICD-10-CM

## 2022-02-08 DIAGNOSIS — F32A Depression, unspecified: Secondary | ICD-10-CM | POA: Diagnosis not present

## 2022-02-08 DIAGNOSIS — K219 Gastro-esophageal reflux disease without esophagitis: Secondary | ICD-10-CM | POA: Diagnosis present

## 2022-02-08 DIAGNOSIS — K635 Polyp of colon: Secondary | ICD-10-CM | POA: Diagnosis present

## 2022-02-08 DIAGNOSIS — Z881 Allergy status to other antibiotic agents status: Secondary | ICD-10-CM | POA: Diagnosis not present

## 2022-02-08 DIAGNOSIS — R52 Pain, unspecified: Secondary | ICD-10-CM | POA: Diagnosis not present

## 2022-02-08 DIAGNOSIS — R0689 Other abnormalities of breathing: Secondary | ICD-10-CM | POA: Diagnosis not present

## 2022-02-08 DIAGNOSIS — E785 Hyperlipidemia, unspecified: Secondary | ICD-10-CM | POA: Diagnosis present

## 2022-02-08 DIAGNOSIS — Z7189 Other specified counseling: Secondary | ICD-10-CM

## 2022-02-08 DIAGNOSIS — R008 Other abnormalities of heart beat: Secondary | ICD-10-CM | POA: Diagnosis not present

## 2022-02-08 DIAGNOSIS — R1030 Lower abdominal pain, unspecified: Secondary | ICD-10-CM

## 2022-02-08 DIAGNOSIS — Z66 Do not resuscitate: Secondary | ICD-10-CM | POA: Diagnosis present

## 2022-02-08 DIAGNOSIS — Z79899 Other long term (current) drug therapy: Secondary | ICD-10-CM

## 2022-02-08 DIAGNOSIS — T45515A Adverse effect of anticoagulants, initial encounter: Secondary | ICD-10-CM | POA: Diagnosis present

## 2022-02-08 DIAGNOSIS — R531 Weakness: Secondary | ICD-10-CM | POA: Diagnosis not present

## 2022-02-08 DIAGNOSIS — K644 Residual hemorrhoidal skin tags: Secondary | ICD-10-CM | POA: Diagnosis present

## 2022-02-08 DIAGNOSIS — G8929 Other chronic pain: Secondary | ICD-10-CM | POA: Diagnosis present

## 2022-02-08 DIAGNOSIS — Z7901 Long term (current) use of anticoagulants: Secondary | ICD-10-CM

## 2022-02-08 DIAGNOSIS — M545 Low back pain, unspecified: Secondary | ICD-10-CM | POA: Diagnosis present

## 2022-02-08 LAB — BASIC METABOLIC PANEL
Anion gap: 5 (ref 5–15)
BUN: 45 mg/dL — ABNORMAL HIGH (ref 8–23)
CO2: 22 mmol/L (ref 22–32)
Calcium: 8.1 mg/dL — ABNORMAL LOW (ref 8.9–10.3)
Chloride: 111 mmol/L (ref 98–111)
Creatinine, Ser: 1.31 mg/dL — ABNORMAL HIGH (ref 0.44–1.00)
GFR, Estimated: 40 mL/min — ABNORMAL LOW (ref >60)
Glucose, Bld: 149 mg/dL — ABNORMAL HIGH (ref 70–99)
Potassium: 4 mmol/L (ref 3.5–5.1)
Sodium: 138 mmol/L (ref 135–145)

## 2022-02-08 LAB — CBC
HCT: 19.3 % — ABNORMAL LOW (ref 36.0–46.0)
Hemoglobin: 5.9 g/dL — ABNORMAL LOW (ref 12.0–15.0)
MCH: 30.6 pg (ref 26.0–34.0)
MCHC: 30.6 g/dL (ref 30.0–36.0)
MCV: 100 fL (ref 80.0–100.0)
Platelets: 288 10*3/uL (ref 150–400)
RBC: 1.93 MIL/uL — ABNORMAL LOW (ref 3.87–5.11)
RDW: 15.9 % — ABNORMAL HIGH (ref 11.5–15.5)
WBC: 15.6 10*3/uL — ABNORMAL HIGH (ref 4.0–10.5)
nRBC: 0.3 % — ABNORMAL HIGH (ref 0.0–0.2)

## 2022-02-08 LAB — HEPATIC FUNCTION PANEL
ALT: 21 U/L (ref 0–44)
AST: 22 U/L (ref 15–41)
Albumin: 2.8 g/dL — ABNORMAL LOW (ref 3.5–5.0)
Alkaline Phosphatase: 42 U/L (ref 38–126)
Bilirubin, Direct: <0.1 mg/dL (ref 0.0–0.2)
Total Bilirubin: 0.6 mg/dL (ref 0.3–1.2)
Total Protein: 5.3 g/dL — ABNORMAL LOW (ref 6.5–8.1)

## 2022-02-08 LAB — TROPONIN I (HIGH SENSITIVITY)
Troponin I (High Sensitivity): 10 ng/L (ref <18)
Troponin I (High Sensitivity): 10 ng/L (ref <18)

## 2022-02-08 LAB — PREPARE RBC (CROSSMATCH)

## 2022-02-08 LAB — PROTIME-INR
INR: >10 (ref 0.8–1.2)
Prothrombin Time: 118.7 seconds — ABNORMAL HIGH (ref 11.4–15.2)

## 2022-02-08 LAB — ABO/RH: ABO/RH(D): O POS

## 2022-02-08 MED ORDER — ONDANSETRON HCL 4 MG/2ML IJ SOLN
4.0000 mg | Freq: Four times a day (QID) | INTRAMUSCULAR | Status: DC | PRN
  Administered 2022-02-09 – 2022-02-11 (×2): 4 mg via INTRAVENOUS
  Filled 2022-02-08 (×2): qty 2

## 2022-02-08 MED ORDER — METOPROLOL SUCCINATE ER 50 MG PO TB24
25.0000 mg | ORAL_TABLET | Freq: Every day | ORAL | Status: DC
  Administered 2022-02-09: 25 mg via ORAL
  Filled 2022-02-08: qty 1

## 2022-02-08 MED ORDER — TRAZODONE HCL 50 MG PO TABS
25.0000 mg | ORAL_TABLET | Freq: Every evening | ORAL | Status: DC | PRN
  Administered 2022-02-08 – 2022-02-12 (×4): 25 mg via ORAL
  Filled 2022-02-08 (×5): qty 1

## 2022-02-08 MED ORDER — ACETAMINOPHEN 325 MG PO TABS
650.0000 mg | ORAL_TABLET | Freq: Four times a day (QID) | ORAL | Status: DC | PRN
  Administered 2022-02-10 – 2022-02-12 (×5): 650 mg via ORAL
  Filled 2022-02-08 (×6): qty 2

## 2022-02-08 MED ORDER — VITAMIN K1 10 MG/ML IJ SOLN
10.0000 mg | Freq: Once | INTRAVENOUS | Status: AC
  Administered 2022-02-08: 10 mg via INTRAVENOUS
  Filled 2022-02-08: qty 1

## 2022-02-08 MED ORDER — PANTOPRAZOLE SODIUM 40 MG IV SOLR
40.0000 mg | Freq: Once | INTRAVENOUS | Status: AC
  Administered 2022-02-08: 40 mg via INTRAVENOUS
  Filled 2022-02-08: qty 10

## 2022-02-08 MED ORDER — BUPROPION HCL 100 MG PO TABS
100.0000 mg | ORAL_TABLET | Freq: Two times a day (BID) | ORAL | Status: DC
  Administered 2022-02-08 – 2022-02-13 (×9): 100 mg via ORAL
  Filled 2022-02-08 (×11): qty 1

## 2022-02-08 MED ORDER — ATORVASTATIN CALCIUM 20 MG PO TABS
80.0000 mg | ORAL_TABLET | Freq: Every day | ORAL | Status: DC
  Administered 2022-02-08 – 2022-02-13 (×6): 80 mg via ORAL
  Filled 2022-02-08 (×3): qty 4
  Filled 2022-02-08: qty 1
  Filled 2022-02-08 (×2): qty 4

## 2022-02-08 MED ORDER — EZETIMIBE 10 MG PO TABS
10.0000 mg | ORAL_TABLET | Freq: Every day | ORAL | Status: DC
  Administered 2022-02-09 – 2022-02-11 (×3): 10 mg via ORAL
  Filled 2022-02-08 (×6): qty 1

## 2022-02-08 MED ORDER — ONDANSETRON HCL 4 MG PO TABS
4.0000 mg | ORAL_TABLET | Freq: Four times a day (QID) | ORAL | Status: DC | PRN
  Filled 2022-02-08: qty 1

## 2022-02-08 MED ORDER — ACETAMINOPHEN 650 MG RE SUPP: 650.0000 mg | Freq: Four times a day (QID) | RECTAL | Status: DC | PRN

## 2022-02-08 MED ORDER — SODIUM CHLORIDE 0.9 % IV SOLN: INTRAVENOUS | Status: DC

## 2022-02-08 MED ORDER — NITROGLYCERIN 0.4 MG SL SUBL
0.4000 mg | SUBLINGUAL_TABLET | SUBLINGUAL | Status: DC | PRN
Start: 2022-02-08 — End: 2022-02-09

## 2022-02-08 MED ORDER — PANTOPRAZOLE INFUSION (NEW) - SIMPLE MED
8.0000 mg/h | INTRAVENOUS | Status: AC
  Administered 2022-02-08 – 2022-02-11 (×7): 8 mg/h via INTRAVENOUS
  Filled 2022-02-08 (×8): qty 100

## 2022-02-08 MED ORDER — SODIUM CHLORIDE 0.9 % IV SOLN: 10.0000 mL/h | Freq: Once | INTRAVENOUS | Status: DC

## 2022-02-08 MED ORDER — PANTOPRAZOLE SODIUM 40 MG IV SOLR
40.0000 mg | Freq: Two times a day (BID) | INTRAVENOUS | Status: DC
  Administered 2022-02-12: 40 mg via INTRAVENOUS
  Filled 2022-02-08: qty 10

## 2022-02-08 MED ORDER — SODIUM CHLORIDE 0.9 % IV BOLUS
500.0000 mL | Freq: Once | INTRAVENOUS | Status: AC
Start: 2022-02-08 — End: 2022-02-08
  Administered 2022-02-08: 500 mL via INTRAVENOUS

## 2022-02-08 NOTE — ED Provider Notes (Signed)
[facility] Provider Note    Event Date/Time   First MD Initiated Contact with Patient 02/08/22 1924     (approximate)   History   Chief Complaint: Dizziness   HPI  Analeise Doriann Zuch is a 88 y.o. female with a history of atrial fibrillation on Coumadin, bipolar disorder, IBS who comes to the ED complaining of dizziness, feeling lightheaded for the past 3 days, gradual onset and constant.  Also has had black tarry stool.  Reports compliance with her Coumadin therapy.  Denies chest pain or shortness of breath.  Denies headache.  Family reports the patient had syncope episode while on the toilet earlier today.  She did not fall or sustain any serious trauma.     Physical Exam   Triage Vital Signs: ED Triage Vitals  Enc Vitals Group     BP 02/08/22 1632 (!) 107/56     Pulse Rate 02/08/22 1632 73     Resp 02/08/22 1632 16     Temp 02/08/22 1632 99.6 F (37.6 C)     Temp Source 02/08/22 1632 Oral     SpO2 02/08/22 1632 96 %     Weight 02/08/22 1651 164 lb (74.4 kg)     Height 02/08/22 1651 '5\' 7"'$  (1.702 m)     Head Circumference --      Peak Flow --      Pain Score 02/08/22 1651 0     Pain Loc --      Pain Edu? --      Excl. in [place]? --     Most recent vital signs: Vitals:   02/08/22 1900 02/08/22 1930  BP: (!) 123/41 (!) 122/37  Pulse: 92 88  Resp: 16 14  Temp:    SpO2: 100% 99%    General: Awake, no distress.  Very pale appearance CV:  Good peripheral perfusion.  Regular rate rhythm Resp:  Normal effort.  Clear to auscultation bilaterally Abd:  No distention.  Soft and nontender.  Rectal exam shows melanotic stool, strongly Hemoccult positive Other:  No wounds, no evidence of head trauma.   ED Results / Procedures / Treatments   Labs (all labs ordered are listed, but only abnormal results are displayed) Labs Reviewed  BASIC METABOLIC PANEL - Abnormal; Notable for the following components:      Result Value   Glucose, Bld 149 (*)     BUN 45 (*)    Creatinine, Ser 1.31 (*)    Calcium 8.1 (*)    GFR, Estimated 40 (*)    All other components within normal limits  CBC - Abnormal; Notable for the following components:   WBC 15.6 (*)    RBC 1.93 (*)    Hemoglobin 5.9 (*)    HCT 19.3 (*)    RDW 15.9 (*)    nRBC 0.3 (*)    All other components within normal limits  HEPATIC FUNCTION PANEL - Abnormal; Notable for the following components:   Total Protein 5.3 (*)    Albumin 2.8 (*)    All other components within normal limits  PROTIME-INR - Abnormal; Notable for the following components:   Prothrombin Time 118.7 (*)    INR >10.0 (*)    All other components within normal limits  BASIC METABOLIC PANEL  CBC  PREPARE RBC (CROSSMATCH)  ABO/RH  PREPARE RBC (CROSSMATCH)  TYPE AND SCREEN  TROPONIN I (HIGH SENSITIVITY)  TROPONIN I (HIGH SENSITIVITY)     EKG Interpreted by me Normal  sinus rhythm rate of 81.  Left axis, left bundle branch block.  No acute ischemic changes.   RADIOLOGY Chest x-ray viewed and interpreted by me, appears normal.  Radiology report reviewed.   PROCEDURES:  .Critical Care Performed by: Carrie Mew, MD Authorized by: Carrie Mew, MD   Critical care provider statement:    Critical care time (minutes):  35   Critical care time was exclusive of:  Separately billable procedures and treating other patients   Critical care was necessary to treat or prevent imminent or life-threatening deterioration of the following conditions:  Circulatory failure   Critical care was time spent personally by me on the following activities:  Development of treatment plan with patient or surrogate, discussions with consultants, evaluation of patient's response to treatment, examination of patient, obtaining history from patient or surrogate, ordering and performing treatments and interventions, ordering and review of laboratory studies, ordering and review of radiographic studies, pulse oximetry,  re-evaluation of patient's condition and review of old charts   Care discussed with: admitting provider     MEDICATIONS ORDERED IN ED: Medications  0.9 %  sodium chloride infusion (0 mL/hr Intravenous Hold 02/08/22 1831)  phytonadione (VITAMIN K) 10 mg in dextrose 5 % 50 mL IVPB (10 mg Intravenous New Bag/Given 02/08/22 1948)  0.9 %  sodium chloride infusion ( Intravenous New Bag/Given 02/08/22 1947)  acetaminophen (TYLENOL) tablet 650 mg (has no administration in time range)    Or  acetaminophen (TYLENOL) suppository 650 mg (has no administration in time range)  traZODone (DESYREL) tablet 25 mg (has no administration in time range)  ondansetron (ZOFRAN) tablet 4 mg (has no administration in time range)    Or  ondansetron (ZOFRAN) injection 4 mg (has no administration in time range)  pantoprazole (PROTONIX) injection 40 mg (40 mg Intravenous Given 02/08/22 1839)  sodium chloride 0.9 % bolus 500 mL (0 mLs Intravenous Stopped 02/08/22 1901)     IMPRESSION / MDM / [employer] / ED COURSE  I reviewed the triage vital signs and the nursing notes.                              Differential diagnosis includes, but is not limited to, upper GI bleed, supratherapeutic INR, dehydration, electrolyte abnormality, liver failure, renal insufficiency  Patient's presentation is most consistent with acute presentation with potential threat to life or bodily function.  Patient presents with syncope and black stool, clinically apparent GI bleed.  Labs show a hemoglobin of 5.9 compared to a baseline of 12.  INR is greater than 10, which is the likely cause of bleeding.  Patient ordered 3 units packed red blood cells transfusion, IV vitamin K 10 mg.  Case discussed with hospitalist for further management.  She is not showing signs of brisk hemorrhage, no hypotension/stroke, so I do not think that reversal with prothrombin complex concentrate is necessary due to rapid effective IV vitamin K.  Troponin  normal, no sign of cardiac demand ischemia       FINAL CLINICAL IMPRESSION(S) / ED DIAGNOSES   Final diagnoses:  Symptomatic anemia  Acute upper GI bleed  Supratherapeutic INR     Rx / DC Orders   ED Discharge Orders     None        Note:  This document was prepared using Dragon voice recognition software and may include unintentional dictation errors.   Carrie Mew, MD 02/08/22 2009

## 2022-02-08 NOTE — Assessment & Plan Note (Signed)
-   We will continue Wellbutrin.

## 2022-02-08 NOTE — Assessment & Plan Note (Signed)
-   We will continue statin therapy. 

## 2022-02-08 NOTE — Assessment & Plan Note (Signed)
-   This is clearly secondary to Coumadin. -She received 10 mg IV vitamin K. - We will follow INR levels.

## 2022-02-08 NOTE — ED Triage Notes (Signed)
Pt brought in via ems from home.  Pt reports dizziness.  Hx afib.  No chest pain or sob.  Pt also has black stools.  Sx for 3 days.    Pt alert.

## 2022-02-08 NOTE — Assessment & Plan Note (Deleted)
-   The patient was admitted to a PCU bed. - The patient has subsequent acute blood loss anemia symptomatic anemia. - She was typed and crossmatched she will be transfused 3 units of PRBCs. - We will follow posttransfusion H&H as well as serial levels. - We will place on IV Protonix with further bolus and drip. - She received 10 mg of IV vitamin K to reverse her coagulopathy. - GI consultation will be obtained. - I notified Dr. Allen Norris about the patient.

## 2022-02-08 NOTE — Assessment & Plan Note (Addendum)
No improvement with IV fluid.  Does not seem to be symptomatic.  Last TTE in 2019 with LVEF of 20 to 25%.  -Check limited TTE -Discontinue IV fluid -Check TSH and cortisol -Continue holding home cardiac meds.

## 2022-02-08 NOTE — H&P (Signed)
[place]   PATIENT NAME: Veronica Ellis    MR#:  [id number]  DATE OF BIRTH:  [birth year]  DATE OF ADMISSION:  02/08/2022  PRIMARY CARE PHYSICIAN: Juline Patch, MD   Patient is coming from: Home  REQUESTING/REFERRING PHYSICIAN: Brenton Grills, MD  CHIEF COMPLAINT:   Chief Complaint  Patient presents with   Dizziness    HISTORY OF PRESENT ILLNESS:  Veronica Ellis is a 88 y.o. Caucasian female with medical history significant for anxiety, depression, hypertension, dyslipidemia, systolic and diastolic CHF, stage IIIa CKD, osteoporosis and atrial fibrillation on anticoagulation with Coumadin, presented to the emergency room with acute onset of dizziness and lightheadedness with a feeling of presyncope.  She admitted to nausea without vomiting or diarrhea.  She does have melanotic stools per ED physician.  She stated that she has been constipated with associated lower abdominal pain.  She felt cold chills but did not check her temperature.  She has been having occasional chest heaviness without palpitations.  No cough or wheezing or hemoptysis.  No dysuria, oliguria or hematuria or flank pain..  No other bleeding diathesis.  ED Course: Upon presentation to the ER, BP was 107/56 with otherwise normal vital signs.  Labs revealed a BUN of 45 and creatinine 1.31 compared to 18/1.15 on 05/19/2020 and blood glucose was 149.  High-sensitivity troponin was done.  CBC showed leukocytosis of 15.6 with hemoglobin of 5.9 hematocrit 19.3 compared to 12.6/38.1 on 03/04/2020.  INR was more than 10 mm PTA 118.7 EKG as reviewed by me : EKG showed normal sinus rhythm with a rate of 81 left bundle branch block and left axis deviation. Imaging: 2 view chest x-ray showed no acute cardiopulmonary disease.  It showed aortic atherosclerosis.  The patient was given 500 mL IV normal saline bolus, 40 mg of IV Protonix, 10 mg of IV vitamin K and was typed and crossmatch for transfusion 3 units of PRBCs.   She will be admitted to a PCU bed for further evaluation and management.   PAST MEDICAL HISTORY:   Past Medical History:  Diagnosis Date   Anxiety    Anxiety    Arthritis    Breast cancer ([place]) 2014   bilateral invasive mammary carcinoma. with 36 rad tx.    Breast cancer ([place])    Cancer ([place])    breast   Depression    Depression    Hard of hearing    Hyperlipemia    Hypertension    IBS (irritable bowel syndrome)    Memory loss    Osteoporosis    Personal history of radiation therapy    Wears dentures    full upper, partial lower  -Systolic and diastolic CHF - Atrial fibrillation - Emphysema - GERD - Chronic low back pain -Stage IIIa chronic kidney disease PAST SURGICAL HISTORY:   Past Surgical History:  Procedure Laterality Date   APPENDECTOMY     BREAST BIOPSY Bilateral 2014   BREAST LUMPECTOMY Bilateral 05/2013   BREAST SURGERY Bilateral    CATARACT EXTRACTION W/PHACO Right 12/11/2017   Procedure: CATARACT EXTRACTION PHACO AND INTRAOCULAR LENS PLACEMENT ([place]) COMPLICATED RIGHT;  Surgeon: Leandrew Koyanagi, MD;  Location: [facility];  Service: Ophthalmology;  Laterality: Right;   COLON SURGERY     12 in. removed due to polyps   LEFT HEART CATH AND CORONARY ANGIOGRAPHY N/A 05/20/2018   Procedure: LEFT HEART CATH AND CORONARY ANGIOGRAPHY;  Surgeon: Corey Skains, MD;  Location: [facility] INVASIVE CV  LAB;  Service: Cardiovascular;  Laterality: N/A;   TUBAL LIGATION      SOCIAL HISTORY:   Social History   Tobacco Use   Smoking status: Former    Packs/day: 1.00    Years: 60.00    Pack years: 60.00    Types: Cigarettes    Quit date: 12/2017    Years since quitting: 4.1   Smokeless tobacco: Never   Tobacco comments:    smoking cessation materials not required  Substance Use Topics   Alcohol use: Not Currently    Alcohol/week: 0.0 standard drinks    FAMILY HISTORY:   Family History  Problem Relation Age of Onset   Breast cancer Daughter 96    Breast cancer Daughter 60   Cirrhosis Mother    Lung cancer Father     DRUG ALLERGIES:   Allergies  Allergen Reactions   Iodine Nausea Only    Betadine okay    Erythromycin Rash   Other Rash   Penicillins Rash    Has patient had a PCN reaction causing immediate rash, facial/tongue/throat swelling, SOB or lightheadedness with hypotension: No Has patient had a PCN reaction causing severe rash involving mucus membranes or skin necrosis: No Has patient had a PCN reaction that required hospitalization: No Has patient had a PCN reaction occurring within the last 10 years: No If all of the above answers are "NO", then may proceed with Cephalosporin use.    Shellfish Allergy Rash    REVIEW OF SYSTEMS:   ROS As per history of present illness. All pertinent systems were reviewed above. Constitutional, HEENT, cardiovascular, respiratory, GI, GU, musculoskeletal, neuro, psychiatric, endocrine, integumentary and hematologic systems were reviewed and are otherwise negative/unremarkable except for positive findings mentioned above in the HPI.   MEDICATIONS AT HOME:   Prior to Admission medications   Medication Sig Start Date End Date Taking? Authorizing Provider  atorvastatin (LIPITOR) 80 MG tablet Take 80 mg by mouth at bedtime.   Yes [provider]  buPROPion (WELLBUTRIN) 100 MG tablet Take 1 tablet (100 mg total) by mouth 2 (two) times daily. 12/21/21  Yes Juline Patch, MD  ezetimibe (ZETIA) 10 MG tablet Take by mouth. Take 1 tablet (10 mg total) by mouth once daily 12/26/21  Yes [provider]  magnesium oxide (MAG-OX) 400 MG tablet Take 1 tablet by mouth daily. 12/26/21 12/26/22 Yes [provider]  metoprolol succinate (TOPROL-XL) 25 MG 24 hr tablet Take 25 mg by mouth daily.   Yes [provider]  nitroGLYCERIN (NITROSTAT) 0.4 MG SL tablet Place 0.4 mg under the tongue every 5 (five) minutes as needed for chest pain.   Yes [provider]   omeprazole (PRILOSEC) 40 MG capsule Take 1 capsule (40 mg total) by mouth 2 (two) times daily. 12/21/21  Yes Juline Patch, MD  spironolactone (ALDACTONE) 25 MG tablet Take 25 mg by mouth at bedtime. 12/31/19  Yes [provider]  torsemide (DEMADEX) 10 MG tablet Take by mouth. Take 1 tablet (10 mg total) by mouth once daily as needed (take if weight increases by 2lbs in 1 day or 5lbs in 1 week from 150lbs.) 12/26/21  Yes [provider]  warfarin (COUMADIN) 3 MG tablet Take 5 mg by mouth daily. 12/26/21 12/26/22 Yes [provider]  gabapentin (NEURONTIN) 300 MG capsule Take 1 capsule by mouth 2 (two) times daily. Patient not taking: Reported on 02/08/2022 06/09/18   [provider]  rosuvastatin (CRESTOR) 40 MG tablet Take  40 mg by mouth daily. Patient not taking: Reported on 02/08/2022    [provider]  sacubitril-valsartan (ENTRESTO) 97-103 MG Take 0.5 tablets by mouth 2 (two) times daily. Patient not taking: Reported on 01/16/2022 05/07/19   Juline Patch, MD      VITAL SIGNS:  Blood pressure (!) 146/56, pulse 91, temperature 98.9 F (37.2 C), temperature source Oral, resp. rate 20, height '5\' 6"'$  (1.676 m), weight 74.9 kg, SpO2 100 %.  PHYSICAL EXAMINATION:  Physical Exam  GENERAL:  88 y.o.-year-old Caucasian female patient lying in the bed with no acute distress.  EYES: Pupils equal, round, reactive to light and accommodation. No scleral icterus.  Positive pallor.  Extraocular muscles intact.  HEENT: Head atraumatic, normocephalic. Oropharynx and nasopharynx clear.  NECK:  Supple, no jugular venous distention. No thyroid enlargement, no tenderness.  LUNGS: Normal breath sounds bilaterally, no wheezing, rales,rhonchi or crepitation. No use of accessory muscles of respiration.  CARDIOVASCULAR: Regular rate and rhythm, S1, S2 normal. No murmurs, rubs, or gallops.  ABDOMEN: Soft, nondistended, nontender. Bowel sounds present. No organomegaly or mass.   EXTREMITIES: No pedal edema, cyanosis, or clubbing.  NEUROLOGIC: Cranial nerves II through XII are intact. Muscle strength 5/5 in all extremities. Sensation intact. Gait not checked.  PSYCHIATRIC: The patient is alert and oriented x 3.  Normal affect and good eye contact. SKIN: No obvious rash, lesion, or ulcer.   LABORATORY PANEL:   CBC Recent Labs  Lab 02/08/22 1653  WBC 15.6*  HGB 5.9*  HCT 19.3*  PLT 288   ------------------------------------------------------------------------------------------------------------------  Chemistries  Recent Labs  Lab 02/08/22 1653 02/08/22 1735  NA 138  --   K 4.0  --   CL 111  --   CO2 22  --   GLUCOSE 149*  --   BUN 45*  --   CREATININE 1.31*  --   CALCIUM 8.1*  --   AST  --  22  ALT  --  21  ALKPHOS  --  42  BILITOT  --  0.6   ------------------------------------------------------------------------------------------------------------------  Cardiac Enzymes No results for input(s): TROPONINI in the last 168 hours. ------------------------------------------------------------------------------------------------------------------  RADIOLOGY:  DG Chest 2 View  Result Date: 02/08/2022 CLINICAL DATA:  Dizziness, constipation common black stools. EXAM: CHEST - 2 VIEW COMPARISON:  Chest radiograph August 21, 2018 FINDINGS: The heart size and mediastinal contours are within normal limits. Aortic atherosclerosis. No focal airspace consolidation. No pleural effusion. No pneumothorax. No acute osseous abnormality. IMPRESSION: 1. No acute cardiopulmonary process. 2.  Aortic Atherosclerosis (ICD10-I70.0). Electronically Signed   By: Dahlia Bailiff M.D.   On: 02/08/2022 17:53      IMPRESSION AND PLAN:  Assessment and Plan: * GI bleeding - The patient was admitted to a PCU bed. - The patient has subsequent acute blood loss anemia symptomatic anemia. - She was typed and crossmatched she will be transfused 3 units of PRBCs. - We will  follow posttransfusion H&H as well as serial levels. - We will place on IV Protonix with further bolus and drip. - She received 10 mg of IV vitamin K to reverse her coagulopathy. - GI consultation will be obtained. - I notified Dr. Allen Norris about the patient.  Coagulopathy ([place]) - This is clearly secondary to Coumadin. -She received 10 mg IV vitamin K. - We will follow INR levels.  Acute kidney injury superimposed on chronic kidney disease ([place]) - This is superimposed on stage IIIa chronic kidney disease, likely secondary to GI bleeding. -She  will be hydrated with IV normal saline and transfused with PRBCs. - We will follow BMPs.  Essential hypertension - We will continue Toprol-XL.  Depression - We will continue Wellbutrin.  Dyslipidemia - We will continue statin therapy.   DVT prophylaxis: SCDs.  Medical prophylaxis contraindicated due to GI bleeding. Advanced Care Planning:  Code Status: This was discussed with the patient and she desires to be DNR/DNI. Family Communication:  The plan of care was discussed in details with the patient (and family). I answered all questions. The patient agreed to proceed with the above mentioned plan. Further management will depend upon hospital course. Disposition Plan: Back to previous home environment Consults called: Gastroenterology. All the records are reviewed and case discussed with ED provider.  Status is: Inpatient   At the time of the admission, it appears that the appropriate admission status for this patient is inpatient.  This is judged to be reasonable and necessary in order to provide the required intensity of service to ensure the patient's safety given the presenting symptoms, physical exam findings and initial radiographic and laboratory data in the context of comorbid conditions.  The patient requires inpatient status due to high intensity of service, high risk of further deterioration and high frequency of surveillance  required.  I certify that at the time of admission, it is my clinical judgment that the patient will require inpatient hospital care extending more than 2 midnights.                            Dispo: The patient is from: Home              Anticipated d/c is to: Home              Patient currently is not medically stable to d/c.              Difficult to place patient: No  Christel Mormon M.D on 02/08/2022 at 9:14 PM  [facility]   From 7 PM-7 AM, contact night-coverage [email or web address]  CC: Primary care physician; Juline Patch, MD

## 2022-02-08 NOTE — Assessment & Plan Note (Addendum)
Recent Labs    02/08/22 1653 02/09/22 0638 02/10/22 1113 02/11/22 0438  BUN 45* 42* 21 15  CREATININE 1.31* 1.20* 1.06* 1.09*  Creatinine better here.  Continue monitoring

## 2022-02-09 ENCOUNTER — Encounter
Admission: EM | Disposition: A | Discharge status: Home Health | Payer: Self-pay | Source: Home / Self Care | Attending: Student

## 2022-02-09 ENCOUNTER — Inpatient Hospital Stay: Payer: Medicare HMO | Admitting: Anesthesiology

## 2022-02-09 ENCOUNTER — Encounter: Payer: Self-pay | Admitting: Family Medicine

## 2022-02-09 ENCOUNTER — Other Ambulatory Visit (HOSPITAL_COMMUNITY): Payer: Self-pay

## 2022-02-09 DIAGNOSIS — Z7189 Other specified counseling: Secondary | ICD-10-CM

## 2022-02-09 DIAGNOSIS — D62 Acute posthemorrhagic anemia: Secondary | ICD-10-CM

## 2022-02-09 DIAGNOSIS — R531 Weakness: Secondary | ICD-10-CM

## 2022-02-09 DIAGNOSIS — R1030 Lower abdominal pain, unspecified: Secondary | ICD-10-CM

## 2022-02-09 DIAGNOSIS — I5022 Chronic systolic (congestive) heart failure: Secondary | ICD-10-CM

## 2022-02-09 DIAGNOSIS — I480 Paroxysmal atrial fibrillation: Secondary | ICD-10-CM

## 2022-02-09 DIAGNOSIS — F419 Anxiety disorder, unspecified: Secondary | ICD-10-CM

## 2022-02-09 DIAGNOSIS — R791 Abnormal coagulation profile: Secondary | ICD-10-CM

## 2022-02-09 DIAGNOSIS — E785 Hyperlipidemia, unspecified: Secondary | ICD-10-CM

## 2022-02-09 DIAGNOSIS — N1832 Chronic kidney disease, stage 3b: Secondary | ICD-10-CM | POA: Diagnosis not present

## 2022-02-09 DIAGNOSIS — K922 Gastrointestinal hemorrhage, unspecified: Secondary | ICD-10-CM | POA: Diagnosis not present

## 2022-02-09 DIAGNOSIS — I2510 Atherosclerotic heart disease of native coronary artery without angina pectoris: Secondary | ICD-10-CM

## 2022-02-09 DIAGNOSIS — I10 Essential (primary) hypertension: Secondary | ICD-10-CM

## 2022-02-09 DIAGNOSIS — D689 Coagulation defect, unspecified: Secondary | ICD-10-CM | POA: Diagnosis not present

## 2022-02-09 DIAGNOSIS — D649 Anemia, unspecified: Secondary | ICD-10-CM | POA: Diagnosis not present

## 2022-02-09 DIAGNOSIS — N390 Urinary tract infection, site not specified: Secondary | ICD-10-CM | POA: Insufficient documentation

## 2022-02-09 DIAGNOSIS — F32A Depression, unspecified: Secondary | ICD-10-CM

## 2022-02-09 DIAGNOSIS — I679 Cerebrovascular disease, unspecified: Secondary | ICD-10-CM

## 2022-02-09 HISTORY — PX: ESOPHAGOGASTRODUODENOSCOPY (EGD) WITH PROPOFOL: SHX5813

## 2022-02-09 LAB — PROTIME-INR
INR: 1.6 — ABNORMAL HIGH (ref 0.8–1.2)
Prothrombin Time: 18.5 seconds — ABNORMAL HIGH (ref 11.4–15.2)

## 2022-02-09 LAB — BASIC METABOLIC PANEL
Anion gap: 4 — ABNORMAL LOW (ref 5–15)
BUN: 42 mg/dL — ABNORMAL HIGH (ref 8–23)
CO2: 22 mmol/L (ref 22–32)
Calcium: 7.8 mg/dL — ABNORMAL LOW (ref 8.9–10.3)
Chloride: 114 mmol/L — ABNORMAL HIGH (ref 98–111)
Creatinine, Ser: 1.2 mg/dL — ABNORMAL HIGH (ref 0.44–1.00)
GFR, Estimated: 44 mL/min — ABNORMAL LOW (ref >60)
Glucose, Bld: 106 mg/dL — ABNORMAL HIGH (ref 70–99)
Potassium: 3.7 mmol/L (ref 3.5–5.1)
Sodium: 140 mmol/L (ref 135–145)

## 2022-02-09 LAB — PREPARE RBC (CROSSMATCH)

## 2022-02-09 LAB — CBC
HCT: 23 % — ABNORMAL LOW (ref 36.0–46.0)
Hemoglobin: 7.9 g/dL — ABNORMAL LOW (ref 12.0–15.0)
MCH: 30.9 pg (ref 26.0–34.0)
MCHC: 34.3 g/dL (ref 30.0–36.0)
MCV: 89.8 fL (ref 80.0–100.0)
Platelets: 213 10*3/uL (ref 150–400)
RBC: 2.56 MIL/uL — ABNORMAL LOW (ref 3.87–5.11)
RDW: 16.4 % — ABNORMAL HIGH (ref 11.5–15.5)
WBC: 11 10*3/uL — ABNORMAL HIGH (ref 4.0–10.5)
nRBC: 0.4 % — ABNORMAL HIGH (ref 0.0–0.2)

## 2022-02-09 SURGERY — ESOPHAGOGASTRODUODENOSCOPY (EGD) WITH PROPOFOL
Anesthesia: General

## 2022-02-09 MED ORDER — PHENYLEPHRINE 80 MCG/ML (10ML) SYRINGE FOR IV PUSH (FOR BLOOD PRESSURE SUPPORT)
PREFILLED_SYRINGE | INTRAVENOUS | Status: AC
  Filled 2022-02-09: qty 10

## 2022-02-09 MED ORDER — PHENYLEPHRINE HCL (PRESSORS) 10 MG/ML IV SOLN
INTRAVENOUS | Status: DC | PRN
  Administered 2022-02-09: 160 ug via INTRAVENOUS

## 2022-02-09 MED ORDER — PROPOFOL 10 MG/ML IV BOLUS
INTRAVENOUS | Status: AC
  Filled 2022-02-09: qty 40

## 2022-02-09 MED ORDER — PROPOFOL 500 MG/50ML IV EMUL
INTRAVENOUS | Status: DC | PRN
  Administered 2022-02-09: 120 ug/kg/min via INTRAVENOUS

## 2022-02-09 MED ORDER — VASOPRESSIN 20 UNIT/ML IV SOLN
INTRAVENOUS | Status: AC
  Filled 2022-02-09: qty 1

## 2022-02-09 MED ORDER — EPHEDRINE SULFATE (PRESSORS) 50 MG/ML IJ SOLN
INTRAMUSCULAR | Status: DC | PRN
  Administered 2022-02-09 (×2): 5 mg via INTRAVENOUS

## 2022-02-09 MED ORDER — PROPOFOL 10 MG/ML IV BOLUS
INTRAVENOUS | Status: DC | PRN
  Administered 2022-02-09: 50 mg via INTRAVENOUS

## 2022-02-09 MED ORDER — LIDOCAINE HCL (PF) 2 % IJ SOLN
INTRAMUSCULAR | Status: AC
  Filled 2022-02-09: qty 5

## 2022-02-09 MED ORDER — EPHEDRINE 5 MG/ML INJ
INTRAVENOUS | Status: AC
  Filled 2022-02-09: qty 5

## 2022-02-09 MED ORDER — PEG 3350-KCL-NA BICARB-NACL 420 G PO SOLR
4000.0000 mL | Freq: Once | ORAL | Status: AC
  Administered 2022-02-09: 4000 mL via ORAL
  Filled 2022-02-09: qty 4000

## 2022-02-09 MED ORDER — SODIUM CHLORIDE 0.9 % IV SOLN: INTRAVENOUS | Status: DC

## 2022-02-09 NOTE — Assessment & Plan Note (Signed)
Patient had question about CODE STATUS.  She was admitted as a DNR but wanted this reversed.  After lengthy discussion about pros and cons of CPR and intubation, patient and her son felt DNR is appropriate.  -Remains DNR.

## 2022-02-09 NOTE — Transfer of Care (Signed)
Immediate Anesthesia Transfer of Care Note  Patient: Veronica Ellis  Procedure(s) Performed: ESOPHAGOGASTRODUODENOSCOPY (EGD) WITH PROPOFOL  Patient Location: PACU  Anesthesia Type:General  Level of Consciousness: awake, alert  and oriented  Airway & Oxygen Therapy: Patient Spontanous Breathing  Post-op Assessment: Report given to RN and Post -op Vital signs reviewed and stable  Post vital signs: Reviewed and stable  Last Vitals:  Vitals Value Taken Time  BP 109/55 02/09/22 1245  Temp 36.8 C 02/09/22 1245  Pulse 76 02/09/22 1247  Resp 17 02/09/22 1247  SpO2 93 % 02/09/22 1247  Vitals shown include unvalidated device data.  Last Pain:  Vitals:   02/09/22 1245  TempSrc: Temporal  PainSc: Asleep      Patients Stated Pain Goal: 0 (06/89/34 0684)  Complications: No notable events documented.

## 2022-02-09 NOTE — Plan of Care (Signed)
INITIAL ADD ? ? ?Problem: Education: ?Goal: Knowledge of General Education information will improve ?Description: Including pain rating scale, medication(s)/side effects and non-pharmacologic comfort measures ?Outcome: Not Applicable ?  ?Problem: Health Behavior/Discharge Planning: ?Goal: Ability to manage health-related needs will improve ?Outcome: Not Applicable ?  ?Problem: Clinical Measurements: ?Goal: Ability to maintain clinical measurements within normal limits will improve ?Outcome: Not Applicable ?Goal: Will remain free from infection ?Outcome: Not Applicable ?Goal: Diagnostic test results will improve ?Outcome: Not Applicable ?Goal: Respiratory complications will improve ?Outcome: Not Applicable ?Goal: Cardiovascular complication will be avoided ?Outcome: Not Applicable ?  ?Problem: Activity: ?Goal: Risk for activity intolerance will decrease ?Outcome: Not Applicable ?  ?Problem: Nutrition: ?Goal: Adequate nutrition will be maintained ?Outcome: Not Applicable ?  ?Problem: Coping: ?Goal: Level of anxiety will decrease ?Outcome: Not Applicable ?  ?Problem: Elimination: ?Goal: Will not experience complications related to bowel motility ?Outcome: Not Applicable ?Goal: Will not experience complications related to urinary retention ?Outcome: Not Applicable ?  ?Problem: Pain Managment: ?Goal: General experience of comfort will improve ?Outcome: Not Applicable ?  ?Problem: Safety: ?Goal: Ability to remain free from injury will improve ?Outcome: Not Applicable ?  ?Problem: Skin Integrity: ?Goal: Risk for impaired skin integrity will decrease ?Outcome: Not Applicable ?  ?

## 2022-02-09 NOTE — Consult Note (Signed)
Jonathon Bellows , MD [address], [place], [place], [place], [address] [address], [place], [place], [place], [address] Phone: [phone]  Fax: [phone]  Consultation  Referring Provider:   Dr Cyndia Skeeters Primary Care Physician:  Juline Patch, MD Primary Gastroenterologist: none        Reason for Consultation:     GI bleed  Date of Admission:  02/08/2022 Date of Consultation:  02/09/2022         HPI:   Veronica Ellis is a 88 y.o. female  Presented to the emergency room with dizziness.  Possibly due to history of CAD, CHF, hypertension, atrial fibrillation on Coumadin previous ER found to have a hemoglobin of 5.9 which was 11 g.  2021 INR greater than bed BUN of 45 and creatinine of 1.31.  She mentioned to the ER that she has had black tarry stools was given vitamin K.  This morning hemoglobin 7.9 g IM 1.6 creatinine 1.2  She is hard of hearing but says she has had tarry black stool past 4 days, some lower abdominal pain, no hematemesis, no nsaid use, last bowel movement was black in color- unclear when . No other complaints presently.   Past Medical History:  Diagnosis Date   Anxiety    Anxiety    Arthritis    Breast cancer ([place]) 2014   bilateral invasive mammary carcinoma. with 36 rad tx.    Breast cancer ([place])    Cancer ([place])    breast   Depression    Depression    Hard of hearing    Hyperlipemia    Hypertension    IBS (irritable bowel syndrome)    Memory loss    Osteoporosis    Personal history of radiation therapy    Wears dentures    full upper, partial lower    Past Surgical History:  Procedure Laterality Date   APPENDECTOMY     BREAST BIOPSY Bilateral 2014   BREAST LUMPECTOMY Bilateral 05/2013   BREAST SURGERY Bilateral    CATARACT EXTRACTION W/PHACO Right 12/11/2017   Procedure: CATARACT EXTRACTION PHACO AND INTRAOCULAR LENS PLACEMENT ([place]) COMPLICATED RIGHT;  Surgeon: Leandrew Koyanagi, MD;  Location: [facility];  Service: Ophthalmology;   Laterality: Right;   COLON SURGERY     12 in. removed due to polyps   LEFT HEART CATH AND CORONARY ANGIOGRAPHY N/A 05/20/2018   Procedure: LEFT HEART CATH AND CORONARY ANGIOGRAPHY;  Surgeon: Corey Skains, MD;  Location: [facility] CV LAB;  Service: Cardiovascular;  Laterality: N/A;   TUBAL LIGATION      Prior to Admission medications   Medication Sig Start Date End Date Taking? Authorizing Provider  atorvastatin (LIPITOR) 80 MG tablet Take 80 mg by mouth at bedtime.   Yes [provider]  buPROPion (WELLBUTRIN) 100 MG tablet Take 1 tablet (100 mg total) by mouth 2 (two) times daily. 12/21/21  Yes Juline Patch, MD  ezetimibe (ZETIA) 10 MG tablet Take by mouth. Take 1 tablet (10 mg total) by mouth once daily 12/26/21  Yes [provider]  magnesium oxide (MAG-OX) 400 MG tablet Take 1 tablet by mouth daily. 12/26/21 12/26/22 Yes [provider]  metoprolol succinate (TOPROL-XL) 25 MG 24 hr tablet Take 25 mg by mouth daily.   Yes [provider]  nitroGLYCERIN (NITROSTAT) 0.4 MG SL tablet Place 0.4 mg under the tongue every 5 (five) minutes as needed for chest pain.   Yes [provider]  omeprazole (  PRILOSEC) 40 MG capsule Take 1 capsule (40 mg total) by mouth 2 (two) times daily. 12/21/21  Yes Juline Patch, MD  spironolactone (ALDACTONE) 25 MG tablet Take 25 mg by mouth at bedtime. 12/31/19  Yes [provider]  torsemide (DEMADEX) 10 MG tablet Take by mouth. Take 1 tablet (10 mg total) by mouth once daily as needed (take if weight increases by 2lbs in 1 day or 5lbs in 1 week from 150lbs.) 12/26/21  Yes [provider]  warfarin (COUMADIN) 3 MG tablet Take 5 mg by mouth daily. 12/26/21 12/26/22 Yes [provider]  gabapentin (NEURONTIN) 300 MG capsule Take 1 capsule by mouth 2 (two) times daily. Patient not taking: Reported on 02/08/2022 06/09/18   [provider]  rosuvastatin (CRESTOR) 40 MG tablet Take 40 mg by  mouth daily. Patient not taking: Reported on 02/08/2022    [provider]  sacubitril-valsartan (ENTRESTO) 97-103 MG Take 0.5 tablets by mouth 2 (two) times daily. Patient not taking: Reported on 01/16/2022 05/07/19   Juline Patch, MD    Family History  Problem Relation Age of Onset   Breast cancer Daughter 73   Breast cancer Daughter 48   Cirrhosis Mother    Lung cancer Father      Social History   Tobacco Use   Smoking status: Former    Packs/day: 1.00    Years: 60.00    Pack years: 60.00    Types: Cigarettes    Quit date: 12/2017    Years since quitting: 4.1   Smokeless tobacco: Never   Tobacco comments:    smoking cessation materials not required  Vaping Use   Vaping Use: Never used  Substance Use Topics   Alcohol use: Not Currently    Alcohol/week: 0.0 standard drinks   Drug use: No    Allergies as of 02/08/2022 - Review Complete 02/08/2022  Allergen Reaction Noted   Iodine Nausea Only 02/16/2015   Erythromycin Rash 02/16/2015   Other Rash 02/16/2015   Penicillins Rash 02/16/2015   Shellfish allergy Rash 02/16/2015    Review of Systems:    All systems reviewed and negative except where noted in HPI.   Physical Exam:  Vital signs in last 24 hours: Temp:  [98.3 F (36.8 C)-99.6 F (37.6 C)] 98.4 F (36.9 C) (06/02 0753) Pulse Rate:  [66-92] 66 (06/02 0753) Resp:  [14-23] 18 (06/02 0753) BP: (96-146)/(29-56) 101/38 (06/02 0753) SpO2:  [96 %-100 %] 97 % (06/02 0753) Weight:  [74.4 kg-74.9 kg] 74.9 kg (06/01 2039) Last BM Date : 02/07/22 (PT STATED) General:   Pleasant, cooperative in NAD Head:  Normocephalic and atraumatic. Eyes:   No icterus.   Conjunctiva pink. PERRLA. Ears:  impaired hearing  Lungs: Respirations even and unlabored. Lungs clear to auscultation bilaterally.   No wheezes, crackles, or rhonchi.  Heart:  IrRegular rate and rhythm;  Without murmur, clicks, rubs or gallops Abdomen:  Soft, nondistended, nontender. Normal bowel  sounds. No appreciable masses or hepatomegaly.  No rebound or guarding.  Neurologic:  Alert and oriented x3;  grossly normal neurologically. Psych:  Alert and cooperative. Normal affect.  LAB RESULTS: Recent Labs    02/08/22 1653 02/09/22 0638  WBC 15.6* 11.0*  HGB 5.9* 7.9*  HCT 19.3* 23.0*  PLT 288 213   BMET Recent Labs    02/08/22 1653 02/09/22 0638  NA 138 140  K 4.0 3.7  CL 111 114*  CO2 22 22  GLUCOSE 149* 106*  BUN  45* 42*  CREATININE 1.31* 1.20*  CALCIUM 8.1* 7.8*   LFT Recent Labs    02/08/22 1735  PROT 5.3*  ALBUMIN 2.8*  AST 22  ALT 21  ALKPHOS 42  BILITOT 0.6  BILIDIR <0.1  IBILI NOT CALCULATED   PT/INR Recent Labs    02/08/22 1735 02/09/22 0638  LABPROT 118.7* 18.5*  INR >10.0* 1.6*    STUDIES: DG Chest 2 View  Result Date: 02/08/2022 CLINICAL DATA:  Dizziness, constipation common black stools. EXAM: CHEST - 2 VIEW COMPARISON:  Chest radiograph August 21, 2018 FINDINGS: The heart size and mediastinal contours are within normal limits. Aortic atherosclerosis. No focal airspace consolidation. No pleural effusion. No pneumothorax. No acute osseous abnormality. IMPRESSION: 1. No acute cardiopulmonary process. 2.  Aortic Atherosclerosis (ICD10-I70.0). Electronically Signed   By: Dahlia Bailiff M.D.   On: 02/08/2022 17:53      Impression / Plan:   Verba Ainley is a 88 y.o. y/o female with history of atrial fibrillation on Coumadin presented with a week history of dizziness, acute onset anemia with a hemoglobin of 5.9 g, melena, INR greater than 10.  Given vitamin K for reversal of INR has improved to under 2.  Hemoglobin stable likely GI bleed from supratherapeutic INR.  Plan 1.  Monitor CBC and transfuse if needed, 2 large IV bore cannulas.  IV PPI 2.  Coumadin has been held. 3. EGD today    I have discussed alternative options, risks & benefits,  which include, but are not limited to, bleeding, infection, perforation,respiratory  complication & drug reaction.  The patient agrees with this plan & written consent will be obtained.       Thank you for involving me in the care of this patient.      LOS: 1 day   Jonathon Bellows, MD  02/09/2022, 11:03 AM

## 2022-02-09 NOTE — TOC Initial Note (Signed)
Transition of Care [facility]) - Initial/Assessment Note    Patient Details  Name: Veronica Ellis MRN: [id number] Date of Birth: December 28, 1933  Transition [facility] Care [facility]) CM/SW Contact:    Laurena Slimmer, RN Phone Number: 02/09/2022, 10:44 AM  Clinical Narrative:                  [facility] of [facility]) Screening Note   Patient Details  Name: Veronica Ellis Date of Birth: 24-Jul-1934   Transition of Care [facility]) CM/SW Contact:    Laurena Slimmer, RN Phone Number: 02/09/2022, 10:44 AM    Transition of Care Department (TOC) has reviewed patient and no TOC needs have been identified at this time. We will continue to monitor patient advancement through interdisciplinary progression rounds. If new patient transition needs arise, please place a TOC consult.          Patient Goals and CMS Choice        Expected Discharge Plan and Services                                                Prior Living Arrangements/Services                       Activities of Daily Living Home Assistive Devices/Equipment: None ADL Screening (condition at time of admission) Patient's cognitive ability adequate to safely complete daily activities?: Yes Is the patient deaf or have difficulty hearing?: Yes Does the patient have difficulty seeing, even when wearing glasses/contacts?: Yes Does the patient have difficulty concentrating, remembering, or making decisions?: No Patient able to express need for assistance with ADLs?: Yes Does the patient have difficulty dressing or bathing?: Yes Independently performs ADLs?: No Communication: Independent with device (comment), Needs assistance Is this a change from baseline?: Pre-admission baseline Dressing (OT): Independent with device (comment) Grooming: Independent Feeding: Independent Bathing: Needs assistance Toileting: Needs assistance In/Out Bed: Needs assistance Walks in Home: Independent with device (comment), Needs  assistance Is this a change from baseline?: Pre-admission baseline Does the patient have difficulty walking or climbing stairs?: Yes Weakness of Legs: Both Weakness of Arms/Hands: None  Permission Sought/Granted                  Emotional Assessment              Admission diagnosis:  GI bleeding [K92.2] Acute upper GI bleed [K92.2] Supratherapeutic INR [R79.1] Symptomatic anemia [D64.9] Patient Active Problem List   Diagnosis Date Noted   GI bleeding 02/08/2022   Coagulopathy ([place]) 02/08/2022   Acute kidney injury superimposed on chronic kidney disease ([place]) 02/08/2022   Dyslipidemia 02/08/2022   Depression 02/08/2022   Essential hypertension 02/08/2022   GERD without esophagitis 08/31/2019   History of IBS 09/26/2018   Long term (current) use of anticoagulants 07/17/2018   3-vessel CAD 07/08/2018   Acute on chronic combined systolic and diastolic CHF (congestive heart failure) ([place]) 07/08/2018   Ischemic cardiomyopathy 07/08/2018   AKI (acute kidney injury) ([place]) 06/06/2018   Hyperkalemia 06/05/2018   Atrial fibrillation with RVR ([place]) 05/21/2018   Microcytic anemia 05/21/2018   NSTEMI (non-ST elevated myocardial infarction) ([place]) 05/19/2018   Acute on chronic systolic heart failure ([place]) 07/30/2017   LBBB (left bundle branch block) 07/10/2017   Lumbar radiculopathy 05/16/2017   Hx of low back pain 05/14/2017  Ataxia 12/31/2016   Memory loss 12/31/2016   Recurrent major depressive disorder, in partial remission ([place]) 12/31/2016   Bipolar 1 disorder, mixed, moderate ([place]) 12/25/2016   Chronic bilateral low back pain with bilateral sciatica 10/18/2016   Bilateral breast cancer ([place]) 02/20/2016   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   Cerebrovascular disease 02/16/2015   Anxiety 02/16/2015   Breast lump 02/16/2015   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[place]) 02/16/2015   Tobacco abuse, in remission 02/16/2015   Routine general medical examination  at a health care facility 02/16/2015   Recurrent major depressive episodes ([place]) 02/16/2015   Below normal amount of sodium in the blood 02/16/2015   Pre-operative cardiovascular examination 02/16/2015   PCP:  Juline Patch, MD Pharmacy:   [id number] [place], [place] [id number]: [phone]: [phone] [facility] [id number]: [phone]: [phone]    Social Determinants of Health (SDOH) Interventions    Readmission Risk Interventions     View : No data to display.

## 2022-02-09 NOTE — Assessment & Plan Note (Addendum)
Patient denies recent change to her warfarin.  INR > 10 on arrival>> 1.4 after IV vitamin K -Asked pharmacy to review her medications to ensure there was no interaction -Discontinue warfarin on discharge.  Cardiology to consider DOAC at next follow-up outpatient.

## 2022-02-09 NOTE — Assessment & Plan Note (Addendum)
Stable.  Had mild chest tightness on arrival likely from anemia.  EKG and troponin without acute finding. -Continue statin and Zetia. -Holding other cardiac meds in the setting of hypotension -Transfuse for Hgb less than 8

## 2022-02-09 NOTE — Assessment & Plan Note (Addendum)
Currently in sinus rhythm.  Rate controlled. -Holding warfarin.  Discontinue on discharge.  Audiology to consider DOAC next follow-up outpatient -Holding Toprol-XL in the setting of hypotension.

## 2022-02-09 NOTE — Assessment & Plan Note (Signed)
Stable.  Continue statin. 

## 2022-02-09 NOTE — Op Note (Signed)
[facility] Gastroenterology Patient Name: Veronica Ellis Procedure Date: 02/09/2022 12:31 PM MRN: [id number] Account #: [id number] Date of Birth: 1934/06/25 Admit Type: Inpatient Age: 88 Room: [facility] ENDO ROOM 4 Gender: Female Note Status: Finalized Instrument Name: Upper Endoscope [id number] Procedure:             Upper GI endoscopy Indications:           Melena Providers:             Jonathon Bellows MD, MD Referring MD:          Juline Patch, MD (Referring MD) Medicines:             Monitored Anesthesia Care Complications:         No immediate complications. Procedure:             Pre-Anesthesia Assessment:                        - Prior to the procedure, a History and Physical was                         performed, and patient medications, allergies and                         sensitivities were reviewed. The patient's tolerance                         of previous anesthesia was reviewed.                        - The risks and benefits of the procedure and the                         sedation options and risks were discussed with the                         patient. All questions were answered and informed                         consent was obtained.                        - ASA Grade Assessment: II - A patient with mild                         systemic disease.                        After obtaining informed consent, the endoscope was                         passed under direct vision. Throughout the procedure,                         the patient's blood pressure, pulse, and oxygen                         saturations were monitored continuously. The Endoscope                         was introduced  through the mouth, and advanced to the                         third part of duodenum. The upper GI endoscopy was                         accomplished with ease. The patient tolerated the                         procedure well. Findings:      The esophagus was normal.       The stomach was normal.      The examined duodenum was normal. Impression:            - Normal esophagus.                        - Normal stomach.                        - Normal examined duodenum.                        - No specimens collected. Recommendation:        - Return patient to hospital ward for ongoing care.                        - Clear liquid diet.                        - Perform a colonoscopy tomorrow. Procedure Code(s):     --- Professional ---                        [phone], Esophagogastroduodenoscopy, flexible,                         transoral; diagnostic, including collection of                         specimen(s) by brushing or washing, when performed                         (separate procedure) Diagnosis Code(s):     --- Professional ---                        K92.1, Melena (includes Hematochezia) CPT copyright 2019 American Medical Association. All rights reserved. The codes documented in this report are preliminary and upon coder review may  be revised to meet current compliance requirements. Jonathon Bellows, MD Jonathon Bellows MD, MD 02/09/2022 12:40:29 PM This report has been signed electronically. Number of Addenda: 0 Note Initiated On: 02/09/2022 12:31 PM Estimated Blood Loss:  Estimated blood loss: none.      [facility]

## 2022-02-09 NOTE — Assessment & Plan Note (Signed)
Stable.  Continue home meds. ?

## 2022-02-09 NOTE — Anesthesia Preprocedure Evaluation (Addendum)
Anesthesia Evaluation  Patient identified by MRN, date of birth, ID band Patient awake    Reviewed: Allergy & Precautions, NPO status , Patient's Chart, lab work & pertinent test results  History of Anesthesia Complications Negative for: history of anesthetic complications  Airway Mallampati: II  TM Distance: >3 FB Neck ROM: full    Dental  (+) Upper Dentures, Lower Dentures   Pulmonary COPD (mild), Current Smoker and Patient abstained from smoking., former smoker,    Pulmonary exam normal        Cardiovascular Exercise Tolerance: Good hypertension, Pt. on home beta blockers + CAD, + Past MI and +CHF (ef=30%)  Normal cardiovascular exam+ dysrhythmias Atrial Fibrillation   05/30/18: Echo LVEF 25%, with probable apical thrombus.  stress: 2018: LVEF= 40%  FINDINGS: Regional wall motion:demonstrateshypokinesis of the Entire myocardium. The overall quality of the study is fair. Artifacts noted: no Left ventricular cavity: enlarged.  Perfusion Analysis:SPECT images demonstrate small perfusion abnormality  of mild intensity is present in the inferior and apical myocardial region  on the stress images.There is normal myocardial perfusion and all  myocardial walls consistent with possible myocardial ischemia in the  inferior base and apex versus some diaphragmatic attenuation;  echO: 2018: MODERATE LV SYSTOLIC DYSFUNCTION (See above) WITH MODERATE LVH NORMAL RIGHT VENTRICULAR SYSTOLIC FUNCTION MILD VALVULAR REGURGITATION (See above) NO VALVULAR STENOSIS MILD MR, TR, PR EF 25-30%;    chronic severe systolic heart failure secondary to Non-ischemic.   Atrial fibrillation on Coumadin   Neuro/Psych PSYCHIATRIC DISORDERS Anxiety Bipolar Disorder Memory loss ? TIA (2016)   GI/Hepatic Neg liver ROS, GERD  Controlled,IBS   Endo/Other  negative endocrine ROS  Renal/GU CRFRenal disease  negative genitourinary    Musculoskeletal  (+) Arthritis , Back pain   Abdominal Normal abdominal exam  (+)   Peds  Hematology  (+) Blood dyscrasia, anemia , B Breast cancer > lumpectomy 2014;   Melena   Anesthesia Other Findings Pt had dizziness with anemia and melena. INR lowered to 1.6. Hbg at 7.9. Pt is hypotensive.   Reproductive/Obstetrics                          Anesthesia Physical  Anesthesia Plan  ASA: III  Anesthesia Plan: General   Post-op Pain Management: Minimal or no pain anticipated   Induction: Intravenous  PONV Risk Score and Plan: Propofol infusion and TIVA  Airway Management Planned: Natural Airway and Simple Face Mask  Additional Equipment:   Intra-op Plan:   Post-operative Plan:   Informed Consent: I have reviewed the patients History and Physical, chart, labs and discussed the procedure including the risks, benefits and alternatives for the proposed anesthesia with the patient or authorized representative who has indicated his/her understanding and acceptance.   Patient has DNR.  Suspend DNR.   Dental advisory given  Plan Discussed with: CRNA  Anesthesia Plan Comments:        Anesthesia Quick Evaluation

## 2022-02-09 NOTE — Assessment & Plan Note (Addendum)
Recent Labs    02/08/22 1653 02/09/22 0638 02/10/22 1113 02/10/22 1517 02/11/22 0438  HGB 5.9* 7.9* 7.1* 7.0* 7.9*  Hgb 13.3 on 12/25/2021.  INR > 10 on arrival.  Down to 1.4 after IV vitamin K. She reports melena prior to presentation. Denies NSAID.  Transfused 3 units so far.  Hgb improved to 7.9. No further bleeding here.  Soft blood pressure but not symptomatic. EGD and colonoscopy without significant finding.  -Continue PPI -Recheck CBC in the afternoon -Discontinue IV fluids -Discontinue warfarin on discharge.  Cardiology to consider [place] outpatient. -Capsule endoscopy underway.

## 2022-02-09 NOTE — Assessment & Plan Note (Addendum)
Reports dysuria and lower abdominal pain.  She has suprapubic tenderness. -Bladder scan as needed -Monitor intake and output -Urinalysis and urine culture -Ceftriaxone after urine sample collection

## 2022-02-09 NOTE — Assessment & Plan Note (Addendum)
Multifactorial.  Therapy recommended SNF but patient and family prefers to go home with home health and DME. -HH and DME ordered.

## 2022-02-09 NOTE — Assessment & Plan Note (Addendum)
TTE in 2019 with LVEF of 20 to 25%, diffuse hypokinesis without significant valvular disease.  Seems to be on Demadex, Aldactone, Entresto and Toprol-XL.  Appears euvolemic on exam.  CXR and serial troponin negative.  Soft BP.  Net +1.6 L. -Discontinue IV fluid -Continue holding home cardiac meds. -Limited echocardiogram -Monitor I&O, daily weight, renal functions and electrolytes. -Continue Lipitor and Zetia

## 2022-02-09 NOTE — TOC Benefit Eligibility Note (Signed)
Patient Teacher, English as a foreign language completed.    The patient is currently admitted and upon discharge could be taking Eliquis 5 mg.  The current 30 day co-pay is, $10.35.   The patient is currently admitted and upon discharge could be taking Xarelto 20 mg.  The current 30 day co-pay is, $10.35.   The patient is insured through [place], [place] Patient Advocate Specialist [place] Patient Advocate Team Direct Number: [phone]  Fax: [phone]

## 2022-02-09 NOTE — Progress Notes (Signed)
PROGRESS NOTE  Veronica Ellis RWE:[id number] DOB: 08/01/1934   PCP: Juline Patch, MD  Patient is from: Home.  Lives with his son.  Uses rolling walker at baseline.  DOA: 02/08/2022 LOS: 1  Chief complaints Chief Complaint  Patient presents with   Dizziness     Brief Narrative / Interim history: 88 year old F with PMH of systolic CHF, A-fib on warfarin, HTN, CKD-3A, HLD, hemorrhoids and mild cognitive impairment presenting with dizziness, chest tightness, melena and constipation, and admitted for symptomatic ABLA from GI bleed in the setting of supratherapeutic INR > 10.  Hgb 5.9 from 13.3 on 12/25/2021.  Transfused 2 units with appropriate response.  Received IV vitamin K for INR reversal.  GI consulted.  EGD basically normal.  Colonoscopy on 6/3.   Subjective: Seen and examined earlier this morning.  No major events overnight of this morning.  She reports pain across lower abdomen.  She was not aware that she had a pur-wick and was holding to her urine until this morning.  However, she also reports dysuria.  She denies nausea, vomiting.  Has not had a bowel movement here.  Extensive discussion about CODE STATUS with patient and patient's son at bedside.  Objective: Vitals:   02/09/22 0753 02/09/22 1208 02/09/22 1245 02/09/22 1255  BP: (!) 101/38 (!) 114/44 (!) 109/55 (!) 93/44  Pulse: 66 62    Resp: 18 17    Temp: 98.4 F (36.9 C) 98.4 F (36.9 C) 98.3 F (36.8 C)   TempSrc: Axillary Temporal Temporal   SpO2: 97% 99%    Weight:      Height:        Examination:  GENERAL: No apparent distress.  Nontoxic. HEENT: MMM.  Vision and hearing grossly intact.  NECK: Supple.  No apparent JVD.  RESP:  No IWOB.  Fair aeration bilaterally. CVS:  RRR. Heart sounds normal.  ABD/GI/GU: BS+. Abd soft. Mild suprapubic tenderness. MSK/EXT:  Moves extremities. No apparent deformity. No edema.  SKIN: no apparent skin lesion or wound NEURO: Awake, alert and oriented x4 except date.   No apparent focal neuro deficit. PSYCH: Calm. Normal affect.   Procedures:  6/2-EGD normal.  Microbiology summarized: None  Assessment and Plan: * Symptomatic ABLA due to GIB in the setting of supratherapeutic INR Recent Labs    02/08/22 1653 02/09/22 0638  HGB 5.9* 7.9*  Hgb 13.3 on 12/25/2021.  Transfused 2 units with appropriate response.  INR > 10 on arrival.  Down to 1.6 after IV vitamin K.  No further bleeding here.  Hemodynamically stable.  She reports melena prior to presentation.  Denies NSAID use.  No further bleed here.  EGD basically normal. -Continue PPI -Hold warfarin.  Discussed with cardiology and pharmacy about DOAC moving forward -Monitor H&H.  Goal Hgb > 8.0 given history of CAD. -Clear liquid diet -Plan for colonoscopy on 6/3.  Goals of care, counseling/discussion Patient had question about CODE STATUS.  She was admitted as a DNR but wanted this reversed.  After lengthy discussion about pros and cons of CPR and intubation, patient and her son felt DNR is appropriate.  -Remains DNR.  CKD-3B Recent Labs    02/08/22 1653 02/09/22 0638  BUN 45* 42*  CREATININE 1.31* 1.20*  Seems to be at baseline.  AKI ruled out.   Coagulopathy/supratherapeutic INR on warfarin Patient denies recent change to her warfarin.  INR > 10 on arrival>> 1.6 after IV vitamin K -Asked pharmacy to review her medications to ensure there  was no interaction -Continue holding warfarin.  Discussing option with pharmacy and cardiology moving forward.  Paroxysmal A-fib (HCC) Currently in sinus rhythm.  Rate controlled. -Holding warfarin in the setting of supratherapeutic INR and GI bleed -Holding Toprol-XL in the setting of hypotension.  Essential hypertension Slightly hypotensive with BP in 90s/40s.  Does not seem to be symptomatic but has not gotten out of the bed yet. -Hold home cardiac meds -Transfuse as appropriate -May add gentle IV fluid if no improvement in BP.  Chronic  systolic CHF (congestive heart failure) (HCC) TTE in 2019 with LVEF of 20 to 25%, diffuse hypokinesis without significant valvular disease.  Seems to be on Demadex, Aldactone, Entresto and Toprol-XL.  Appears euvolemic on exam.  CXR and serial troponin negative.  Soft blood pressure. -Hold home cardiac medications.  Discontinue Toprol-XL. -Monitor I&O, daily weight, renal functions and electrolytes. -Continue Lipitor and Zetia  Generalized weakness Likely from anemia. -Treat anemia as above -PT/OT eval  Lower abdominal pain and tenderness Likely from holding the urine. She also reports dysuria.  -Bladder scan -Monitor intake and output -Urinalysis  Dyslipidemia - We will continue statin therapy.  3-vessel CAD Stable.  Had mild chest tightness on arrival likely from anemia.  EKG and troponin without acute finding. -Continue started on Zetia -Holding other cardiac meds in the setting of hypotension -Transfuse for Hgb less than 8  Anxiety and depression Stable.  Continue home meds.  Cerebrovascular disease Stable.  Continue statin       DVT prophylaxis:  SCDs Start: 02/08/22 1929  Code Status: DNR/DNI Family Communication: Updated patient's son at bedside. Level of care: Progressive Status is: Inpatient Remains inpatient appropriate because: Symptomatic ABLA due to GI bleed in the setting of supratherapeutic INR   Final disposition: TBD after PT/OT evaluation Consultants:  Gastroenterology Cardiology   Sch Meds:  Scheduled Meds:  [MAR Hold] atorvastatin  80 mg Oral Daily   [MAR Hold] buPROPion  100 mg Oral BID   [MAR Hold] ezetimibe  10 mg Oral Daily   [MAR Hold] pantoprazole  40 mg Intravenous Q12H   polyethylene glycol-electrolytes  4,000 mL Oral Once   Continuous Infusions:  [MAR Hold] sodium chloride Stopped (02/08/22 1831)   sodium chloride 100 mL/hr at 02/09/22 1223   sodium chloride     pantoprazole 8 mg/hr (02/09/22 0800)   PRN Meds:.[MAR Hold]  acetaminophen **OR** [MAR Hold] acetaminophen, [MAR Hold] ondansetron **OR** [MAR Hold] ondansetron (ZOFRAN) IV, [MAR Hold] traZODone  Antimicrobials: Anti-infectives (From admission, onward)    None        I have personally reviewed the following labs and images: CBC: Recent Labs  Lab 02/08/22 1653 02/09/22 0638  WBC 15.6* 11.0*  HGB 5.9* 7.9*  HCT 19.3* 23.0*  MCV 100.0 89.8  PLT 288 213   BMP &GFR Recent Labs  Lab 02/08/22 1653 02/09/22 0638  NA 138 140  K 4.0 3.7  CL 111 114*  CO2 22 22  GLUCOSE 149* 106*  BUN 45* 42*  CREATININE 1.31* 1.20*  CALCIUM 8.1* 7.8*   Estimated Creatinine Clearance: 34.8 mL/min (A) (by C-G formula based on SCr of 1.2 mg/dL (H)). Liver & Pancreas: Recent Labs  Lab 02/08/22 1735  AST 22  ALT 21  ALKPHOS 42  BILITOT 0.6  PROT 5.3*  ALBUMIN 2.8*   No results for input(s): LIPASE, AMYLASE in the last 168 hours. No results for input(s): AMMONIA in the last 168 hours. Diabetic: No results for input(s): HGBA1C in the last  72 hours. No results for input(s): GLUCAP in the last 168 hours. Cardiac Enzymes: No results for input(s): CKTOTAL, CKMB, CKMBINDEX, TROPONINI in the last 168 hours. No results for input(s): PROBNP in the last 8760 hours. Coagulation Profile: Recent Labs  Lab 02/08/22 1735 02/09/22 0638  INR >10.0* 1.6*   Thyroid Function Tests: No results for input(s): TSH, T4TOTAL, FREET4, T3FREE, THYROIDAB in the last 72 hours. Lipid Profile: No results for input(s): CHOL, HDL, LDLCALC, TRIG, CHOLHDL, LDLDIRECT in the last 72 hours. Anemia Panel: No results for input(s): VITAMINB12, FOLATE, FERRITIN, TIBC, IRON, RETICCTPCT in the last 72 hours. Urine analysis:    Component Value Date/Time   COLORURINE COLORLESS (A) 03/04/2020 1434   APPEARANCEUR CLEAR (A) 03/04/2020 1434   APPEARANCEUR Hazy 04/29/2014 1810   LABSPEC 1.018 03/04/2020 1434   LABSPEC 1.023 04/29/2014 1810   PHURINE 5.0 03/04/2020 1434   GLUCOSEU  NEGATIVE 03/04/2020 1434   GLUCOSEU Negative 04/29/2014 1810   HGBUR NEGATIVE 03/04/2020 1434   BILIRUBINUR NEGATIVE 03/04/2020 1434   BILIRUBINUR Negative 04/29/2014 [place] 03/04/2020 [place] 03/04/2020 1434   NITRITE NEGATIVE 03/04/2020 1434   LEUKOCYTESUR NEGATIVE 03/04/2020 1434   LEUKOCYTESUR Trace 04/29/2014 1810   Sepsis Labs: Invalid input(s): PROCALCITONIN, [place]  Microbiology: No results found for this or any previous visit (from the past 240 hour(s)).  Radiology Studies: DG Chest 2 View  Result Date: 02/08/2022 CLINICAL DATA:  Dizziness, constipation common black stools. EXAM: CHEST - 2 VIEW COMPARISON:  Chest radiograph August 21, 2018 FINDINGS: The heart size and mediastinal contours are within normal limits. Aortic atherosclerosis. No focal airspace consolidation. No pleural effusion. No pneumothorax. No acute osseous abnormality. IMPRESSION: 1. No acute cardiopulmonary process. 2.  Aortic Atherosclerosis (ICD10-I70.0). Electronically Signed   By: Dahlia Bailiff M.D.   On: 02/08/2022 17:53      Elyna Pangilinan T. [place]  If 7PM-7AM, please contact night-coverage [email or web address] 02/09/2022, 1:15 PM

## 2022-02-09 NOTE — Hospital Course (Addendum)
88 year old F with PMH of systolic CHF, A-fib on warfarin, HTN, CKD-3A, HLD, hemorrhoids and mild cognitive impairment presenting with dizziness, chest tightness, melena and constipation, and admitted for symptomatic ABLA from GI bleed in the setting of supratherapeutic INR > 10.  Hgb 5.9 from 13.3 on 12/25/2021.  Transfused 2 units with appropriate response.  Received IV vitamin K for INR reversal.  GI consulted.  EGD and colonoscopy basically normal.  Capsule endoscopy underway.  Patient has dysuria and suprapubic tenderness.  UA and urine culture ordered.  Started on IV ceftriaxone  In regards to anticoagulation, discussed about DOAC with pharmacy and Dr. Saralyn Pilar at [facility] via secure chat on 6/2.  No contraindication but Dr. Saralyn Pilar recommended resuming anticoagulation after outpatient follow-up at cardiology office.  Therapy recommended SNF but patient and family prefers to go home with home health and DME (ordered).

## 2022-02-09 NOTE — H&P (Signed)
Veronica Bellows, MD [address] [place], [place], [place], [address] [address], [place], [place], [place], [address] Phone: [phone]  Fax: [phone]  Primary Care Physician:  Juline Patch, MD   Pre-Procedure History & Physical: HPI:  Veronica Ellis is a 88 y.o. female is here for an endoscopy    Past Medical History:  Diagnosis Date   Anxiety    Anxiety    Arthritis    Breast cancer [facility]) 2014   bilateral invasive mammary carcinoma. with 36 rad tx.    Breast cancer ([place])    Cancer ([place])    breast   Depression    Depression    Hard of hearing    Hyperlipemia    Hypertension    IBS (irritable bowel syndrome)    Memory loss    Osteoporosis    Personal history of radiation therapy    Wears dentures    full upper, partial lower    Past Surgical History:  Procedure Laterality Date   APPENDECTOMY     BREAST BIOPSY Bilateral 2014   BREAST LUMPECTOMY Bilateral 05/2013   BREAST SURGERY Bilateral    CATARACT EXTRACTION W/PHACO Right 12/11/2017   Procedure: CATARACT EXTRACTION PHACO AND INTRAOCULAR LENS PLACEMENT ([place]) COMPLICATED RIGHT;  Surgeon: Leandrew Koyanagi, MD;  Location: [place];  Service: Ophthalmology;  Laterality: Right;   COLON SURGERY     12 in. removed due to polyps   LEFT HEART CATH AND CORONARY ANGIOGRAPHY N/A 05/20/2018   Procedure: LEFT HEART CATH AND CORONARY ANGIOGRAPHY;  Surgeon: Corey Skains, MD;  Location: [facility];  Service: Cardiovascular;  Laterality: N/A;   TUBAL LIGATION      Prior to Admission medications   Medication Sig Start Date End Date Taking? Authorizing Provider  atorvastatin (LIPITOR) 80 MG tablet Take 80 mg by mouth at bedtime.   Yes [provider]  buPROPion (WELLBUTRIN) 100 MG tablet Take 1 tablet (100 mg total) by mouth 2 (two) times daily. 12/21/21  Yes Juline Patch, MD  ezetimibe (ZETIA) 10 MG tablet Take by mouth. Take 1 tablet (10 mg total) by mouth once daily  12/26/21  Yes [provider]  magnesium oxide (MAG-OX) 400 MG tablet Take 1 tablet by mouth daily. 12/26/21 12/26/22 Yes [provider]  metoprolol succinate (TOPROL-XL) 25 MG 24 hr tablet Take 25 mg by mouth daily.   Yes [provider]  nitroGLYCERIN (NITROSTAT) 0.4 MG SL tablet Place 0.4 mg under the tongue every 5 (five) minutes as needed for chest pain.   Yes [provider]  omeprazole (PRILOSEC) 40 MG capsule Take 1 capsule (40 mg total) by mouth 2 (two) times daily. 12/21/21  Yes Juline Patch, MD  spironolactone (ALDACTONE) 25 MG tablet Take 25 mg by mouth at bedtime. 12/31/19  Yes [provider]  torsemide (DEMADEX) 10 MG tablet Take by mouth. Take 1 tablet (10 mg total) by mouth once daily as needed (take if weight increases by 2lbs in 1 day or 5lbs in 1 week from 150lbs.) 12/26/21  Yes [provider]  warfarin (COUMADIN) 3 MG tablet Take 5 mg by mouth daily. 12/26/21 12/26/22 Yes [provider]  gabapentin (NEURONTIN) 300 MG capsule Take 1 capsule by mouth 2 (two) times daily. Patient not taking: Reported on 02/08/2022 06/09/18   [provider]  rosuvastatin (CRESTOR) 40 MG tablet Take 40 mg by mouth daily. Patient not taking: Reported on 02/08/2022  [provider]  sacubitril-valsartan (ENTRESTO) 97-103 MG Take 0.5 tablets by mouth 2 (two) times daily. Patient not taking: Reported on 01/16/2022 05/07/19   Juline Patch, MD    Allergies as of 02/08/2022 - Review Complete 02/08/2022  Allergen Reaction Noted   Iodine Nausea Only 02/16/2015   Erythromycin Rash 02/16/2015   Other Rash 02/16/2015   Penicillins Rash 02/16/2015   Shellfish allergy Rash 02/16/2015    Family History  Problem Relation Age of Onset   Breast cancer Daughter 19   Breast cancer Daughter 68   Cirrhosis Mother    Lung cancer Father     Social History   Socioeconomic History   Marital status: Widowed    Spouse name: Not on  file   Number of children: 5   Years of education: some college   Highest education level: 12th grade  Occupational History   Occupation: Retired  Tobacco Use   Smoking status: Former    Packs/day: 1.00    Years: 60.00    Pack years: 60.00    Types: Cigarettes    Quit date: 12/2017    Years since quitting: 4.1   Smokeless tobacco: Never   Tobacco comments:    smoking cessation materials not required  Vaping Use   Vaping Use: Never used  Substance and Sexual Activity   Alcohol use: Not Currently    Alcohol/week: 0.0 standard drinks   Drug use: No   Sexual activity: Yes  Other Topics Concern   Not on file  Social History Narrative   Pt's son Ed lives with her   Social Determinants of Health   Financial Resource Strain: Low Risk    Difficulty of Paying Living Expenses: Not very hard  Food Insecurity: No Food Insecurity   Worried About Charity fundraiser in the Last Year: Never true   [place] in the Last Year: Never true  Transportation Needs: No Transportation Needs   Lack of Transportation (Medical): No   Lack of Transportation (Non-Medical): No  Physical Activity: Inactive   Days of Exercise per Week: 0 days   Minutes of Exercise per Session: 0 min  Stress: No Stress Concern Present   Feeling of Stress : Only a little  Social Connections: Socially Isolated   Frequency of Communication with Friends and Family: More than three times a week   Frequency of Social Gatherings with Friends and Family: Three times a week   Attends Religious Services: Never   Active Member of Clubs or Organizations: No   Attends Archivist Meetings: Never   Marital Status: Widowed  Human resources officer Violence: Not At Risk   Fear of Current or Ex-Partner: No   Emotionally Abused: No   Physically Abused: No   Sexually Abused: No    Review of Systems: See HPI, otherwise negative ROS  Physical Exam: BP (!) 114/44   Pulse 62   Temp 98.4 F (36.9 C) (Temporal)   Resp  17   Ht '5\' 6"'$  (1.676 m)   Wt 74.9 kg   SpO2 99%   BMI 26.66 kg/m  General:   Alert,  pleasant and cooperative in NAD Head:  Normocephalic and atraumatic. Neck:  Supple; no masses or thyromegaly. Lungs:  Clear throughout to auscultation, normal respiratory effort.    Heart:  +S1, +S2, Regular rate and rhythm, No edema. Abdomen:  Soft, nontender and nondistended. Normal bowel sounds, without guarding, and without rebound.   Neurologic:  Alert and  oriented  x4;  grossly normal neurologically.  Impression/Plan: Veronica Ellis is here for an endoscopy  to be performed for  evaluation of melena    Risks, benefits, limitations, and alternatives regarding endoscopy have been reviewed with the patient.  Questions have been answered.  All parties agreeable.   Veronica Bellows, MD  02/09/2022, 12:27 PM

## 2022-02-10 ENCOUNTER — Inpatient Hospital Stay: Payer: Medicare HMO | Admitting: Anesthesiology

## 2022-02-10 ENCOUNTER — Encounter
Admission: EM | Disposition: A | Discharge status: Home Health | Payer: Self-pay | Source: Home / Self Care | Attending: Student

## 2022-02-10 ENCOUNTER — Encounter: Payer: Self-pay | Admitting: Family Medicine

## 2022-02-10 DIAGNOSIS — D689 Coagulation defect, unspecified: Secondary | ICD-10-CM | POA: Diagnosis not present

## 2022-02-10 DIAGNOSIS — R791 Abnormal coagulation profile: Secondary | ICD-10-CM | POA: Diagnosis not present

## 2022-02-10 DIAGNOSIS — D649 Anemia, unspecified: Secondary | ICD-10-CM | POA: Diagnosis not present

## 2022-02-10 DIAGNOSIS — N3000 Acute cystitis without hematuria: Secondary | ICD-10-CM

## 2022-02-10 DIAGNOSIS — N1832 Chronic kidney disease, stage 3b: Secondary | ICD-10-CM | POA: Diagnosis not present

## 2022-02-10 DIAGNOSIS — D62 Acute posthemorrhagic anemia: Secondary | ICD-10-CM | POA: Diagnosis not present

## 2022-02-10 HISTORY — PX: COLONOSCOPY WITH PROPOFOL: SHX5780

## 2022-02-10 LAB — IRON AND TIBC
Iron: 30 ug/dL (ref 28–170)
Saturation Ratios: 11 % (ref 10.4–31.8)
TIBC: 274 ug/dL (ref 250–450)
UIBC: 244 ug/dL

## 2022-02-10 LAB — RENAL FUNCTION PANEL
Albumin: 2.5 g/dL — ABNORMAL LOW (ref 3.5–5.0)
Anion gap: 3 — ABNORMAL LOW (ref 5–15)
BUN: 21 mg/dL (ref 8–23)
CO2: 23 mmol/L (ref 22–32)
Calcium: 7.3 mg/dL — ABNORMAL LOW (ref 8.9–10.3)
Chloride: 114 mmol/L — ABNORMAL HIGH (ref 98–111)
Creatinine, Ser: 1.06 mg/dL — ABNORMAL HIGH (ref 0.44–1.00)
GFR, Estimated: 51 mL/min — ABNORMAL LOW (ref >60)
Glucose, Bld: 91 mg/dL (ref 70–99)
Phosphorus: 3.4 mg/dL (ref 2.5–4.6)
Potassium: 3.5 mmol/L (ref 3.5–5.1)
Sodium: 140 mmol/L (ref 135–145)

## 2022-02-10 LAB — FERRITIN: Ferritin: 47 ng/mL (ref 11–307)

## 2022-02-10 LAB — CBC
HCT: 22.2 % — ABNORMAL LOW (ref 36.0–46.0)
HCT: 21.8 % — ABNORMAL LOW (ref 36.0–46.0)
Hemoglobin: 7.1 g/dL — ABNORMAL LOW (ref 12.0–15.0)
Hemoglobin: 7 g/dL — ABNORMAL LOW (ref 12.0–15.0)
MCH: 30.5 pg (ref 26.0–34.0)
MCH: 30.4 pg (ref 26.0–34.0)
MCHC: 32 g/dL (ref 30.0–36.0)
MCHC: 32.1 g/dL (ref 30.0–36.0)
MCV: 95.3 fL (ref 80.0–100.0)
MCV: 94.8 fL (ref 80.0–100.0)
Platelets: 198 10*3/uL (ref 150–400)
Platelets: 206 10*3/uL (ref 150–400)
RBC: 2.33 MIL/uL — ABNORMAL LOW (ref 3.87–5.11)
RBC: 2.3 MIL/uL — ABNORMAL LOW (ref 3.87–5.11)
RDW: 17.8 % — ABNORMAL HIGH (ref 11.5–15.5)
RDW: 17.6 % — ABNORMAL HIGH (ref 11.5–15.5)
WBC: 6.8 10*3/uL (ref 4.0–10.5)
WBC: 7.6 10*3/uL (ref 4.0–10.5)
nRBC: 0 % (ref 0.0–0.2)
nRBC: 0 % (ref 0.0–0.2)

## 2022-02-10 LAB — MAGNESIUM: Magnesium: 1.9 mg/dL (ref 1.7–2.4)

## 2022-02-10 LAB — PROTIME-INR
INR: 1.4 — ABNORMAL HIGH (ref 0.8–1.2)
Prothrombin Time: 17.1 seconds — ABNORMAL HIGH (ref 11.4–15.2)

## 2022-02-10 LAB — PREPARE RBC (CROSSMATCH)

## 2022-02-10 LAB — FOLATE: Folate: 20.3 ng/mL (ref >5.9)

## 2022-02-10 LAB — VITAMIN B12: Vitamin B-12: 209 pg/mL (ref 180–914)

## 2022-02-10 SURGERY — COLONOSCOPY WITH PROPOFOL
Anesthesia: General

## 2022-02-10 MED ORDER — PROPOFOL 10 MG/ML IV BOLUS
INTRAVENOUS | Status: DC | PRN
  Administered 2022-02-10: 40 mg via INTRAVENOUS

## 2022-02-10 MED ORDER — PROPOFOL 500 MG/50ML IV EMUL
INTRAVENOUS | Status: DC | PRN
  Administered 2022-02-10: 100 ug/kg/min via INTRAVENOUS

## 2022-02-10 MED ORDER — EPHEDRINE 5 MG/ML INJ
INTRAVENOUS | Status: AC
  Filled 2022-02-10: qty 5

## 2022-02-10 MED ORDER — PHENYLEPHRINE 80 MCG/ML (10ML) SYRINGE FOR IV PUSH (FOR BLOOD PRESSURE SUPPORT)
PREFILLED_SYRINGE | INTRAVENOUS | Status: AC
  Filled 2022-02-10: qty 10

## 2022-02-10 MED ORDER — SODIUM CHLORIDE 0.9 % IV SOLN
1.0000 g | INTRAVENOUS | Status: DC
  Administered 2022-02-10 – 2022-02-11 (×2): 1 g via INTRAVENOUS
  Filled 2022-02-10 (×3): qty 10

## 2022-02-10 MED ORDER — SODIUM CHLORIDE 0.9% IV SOLUTION: Freq: Once | INTRAVENOUS | Status: AC

## 2022-02-10 MED ORDER — PROPOFOL 10 MG/ML IV BOLUS
INTRAVENOUS | Status: AC
  Filled 2022-02-10: qty 40

## 2022-02-10 MED ORDER — LIDOCAINE HCL (CARDIAC) PF 100 MG/5ML IV SOSY
PREFILLED_SYRINGE | INTRAVENOUS | Status: DC | PRN
  Administered 2022-02-10: 40 mg via INTRAVENOUS

## 2022-02-10 NOTE — Op Note (Signed)
[facility] Gastroenterology Patient Name: Veronica Ellis Procedure Date: 02/10/2022 7:28 AM MRN: [id number] Account #: [id number] Date of Birth: 1934-05-17 Admit Type: Inpatient Age: 88 Room: [facility] ENDO ROOM 4 Gender: Female Note Status: Finalized Instrument Name: Park Meo [id number] Procedure:             Colonoscopy Indications:           Melena Providers:             Lin Landsman MD, MD Referring MD:          Juline Patch, MD (Referring MD) Medicines:             General Anesthesia Complications:         No immediate complications. Estimated blood loss: None. Procedure:             Pre-Anesthesia Assessment:                        - Prior to the procedure, a History and Physical was                         performed, and patient medications and allergies were                         reviewed. The patient is competent. The risks and                         benefits of the procedure and the sedation options and                         risks were discussed with the patient. All questions                         were answered and informed consent was obtained.                         Patient identification and proposed procedure were                         verified by the physician, the nurse, the                         anesthesiologist, the anesthetist and the technician                         in the pre-procedure area in the procedure room in the                         endoscopy suite. Mental Status Examination: alert and                         oriented. Airway Examination: normal oropharyngeal                         airway and neck mobility. Respiratory Examination:                         clear to auscultation. CV Examination: normal.  Prophylactic Antibiotics: The patient does not require                         prophylactic antibiotics. Prior Anticoagulants: The                         patient has taken Coumadin (warfarin),  last dose was 3                         days prior to procedure. ASA Grade Assessment: III - A                         patient with severe systemic disease. After reviewing                         the risks and benefits, the patient was deemed in                         satisfactory condition to undergo the procedure. The                         anesthesia plan was to use general anesthesia.                         Immediately prior to administration of medications,                         the patient was re-assessed for adequacy to receive                         sedatives. The heart rate, respiratory rate, oxygen                         saturations, blood pressure, adequacy of pulmonary                         ventilation, and response to care were monitored                         throughout the procedure. The physical status of the                         patient was re-assessed after the procedure.                        After obtaining informed consent, the colonoscope was                         passed under direct vision. Throughout the procedure,                         the patient's blood pressure, pulse, and oxygen                         saturations were monitored continuously. The                         Colonoscope was introduced through the anus and  advanced to the the ileocolonic anastomosis. The                         colonoscopy was performed without difficulty. The                         patient tolerated the procedure well. The quality of                         the bowel preparation was adequate. Findings:      Skin tags were found on perianal exam. Liquid yellow stool in the entire       colon      There was evidence of a prior end-to-side ileo-colonic anastomosis in       the ascending colon. This was patent and was characterized by healthy       appearing mucosa. The anastomosis was traversed.      The neo-terminal ileum appeared normal.       A 5 mm polyp was found in the transverse colon. The polyp was sessile.       The polyp was removed with a cold snare. Resection and retrieval were       complete.      Non-bleeding external hemorrhoids were found during retroflexion. The       hemorrhoids were moderate.      Normal mucosa was found in the entire colon. Impression:            - Perianal skin tags found on perianal exam.                        - Patent end-to-side ileo-colonic anastomosis,                         characterized by healthy appearing mucosa.                        - The examined portion of the ileum was normal.                        - One 5 mm polyp in the transverse colon, removed with                         a cold snare. Resected and retrieved.                        - Non-bleeding external hemorrhoids.                        - Normal mucosa in the entire examined colon. Recommendation:        - Return patient to hospital ward for ongoing care.                        - Resume regular diet today.                        - Continue present medications. Procedure Code(s):     --- Professional ---                        [phone], Colonoscopy, flexible; with removal of  tumor(s), polyp(s), or other lesion(s) by snare                         technique Diagnosis Code(s):     --- Professional ---                        Z98.0, Intestinal bypass and anastomosis status                        K64.4, Residual hemorrhoidal skin tags                        K63.5, Polyp of colon                        K92.1, Melena (includes Hematochezia) CPT copyright 2019 American Medical Association. All rights reserved. The codes documented in this report are preliminary and upon coder review may  be revised to meet current compliance requirements. Dr. Ulyess Mort Lin Landsman MD, MD 02/10/2022 9:33:43 AM This report has been signed electronically. Number of Addenda: 0 Note Initiated On: 02/10/2022 7:28  AM Scope Withdrawal Time: 0 hours 5 minutes 49 seconds  Total Procedure Duration: 0 hours 8 minutes 8 seconds  Estimated Blood Loss:  Estimated blood loss: none.      [facility]

## 2022-02-10 NOTE — Anesthesia Procedure Notes (Signed)
Date/Time: 02/10/2022 9:16 AM Performed by: Doreen Salvage, CRNA Pre-anesthesia Checklist: Patient identified, Emergency Drugs available, Suction available and Patient being monitored Patient Re-evaluated:Patient Re-evaluated prior to induction Oxygen Delivery Method: Nasal cannula Induction Type: IV induction Dental Injury: Teeth and Oropharynx as per pre-operative assessment  Comments: Nasal cannula with etCO2 monitoring

## 2022-02-10 NOTE — Plan of Care (Signed)
  Problem: Education: Goal: Ability to identify signs and symptoms of gastrointestinal bleeding will improve Outcome: Progressing   Problem: Bowel/Gastric: Goal: Will show no signs and symptoms of gastrointestinal bleeding Outcome: Progressing   Problem: Fluid Volume: Goal: Will show no signs and symptoms of excessive bleeding Outcome: Progressing   Problem: Clinical Measurements: Goal: Complications related to the disease process, condition or treatment will be avoided or minimized Outcome: Progressing   Problem: Cardiac: Goal: Ability to achieve and maintain adequate cardiopulmonary perfusion will improve Outcome: Progressing   Problem: Education: Goal: Knowledge of General Education information will improve Description: Including pain rating scale, medication(s)/side effects and non-pharmacologic comfort measures Outcome: Progressing   Problem: Health Behavior/Discharge Planning: Goal: Ability to manage health-related needs will improve Outcome: Progressing   Problem: Clinical Measurements: Goal: Ability to maintain clinical measurements within normal limits will improve Outcome: Progressing Goal: Will remain free from infection Outcome: Progressing Goal: Diagnostic test results will improve Outcome: Progressing Goal: Respiratory complications will improve Outcome: Progressing Goal: Cardiovascular complication will be avoided Outcome: Progressing   Problem: Activity: Goal: Risk for activity intolerance will decrease Outcome: Progressing   Problem: Coping: Goal: Level of anxiety will decrease Outcome: Progressing   Problem: Elimination: Goal: Will not experience complications related to bowel motility Outcome: Progressing Goal: Will not experience complications related to urinary retention Outcome: Progressing   Problem: Pain Managment: Goal: General experience of comfort will improve Outcome: Progressing   Problem: Safety: Goal: Ability to remain free  from injury will improve Outcome: Progressing   Problem: Skin Integrity: Goal: Risk for impaired skin integrity will decrease Outcome: Progressing

## 2022-02-10 NOTE — Transfer of Care (Signed)
Immediate Anesthesia Transfer of Care Note  Patient: Veronica Ellis  Procedure(s) Performed: Procedure(s): COLONOSCOPY WITH PROPOFOL (N/A)  Patient Location: PACU and Endoscopy Unit  Anesthesia Type:General  Level of Consciousness: sedated  Airway & Oxygen Therapy: Patient Spontanous Breathing and Patient connected to nasal cannula oxygen  Post-op Assessment: Report given to RN and Post -op Vital signs reviewed and stable  Post vital signs: Reviewed and stable  Last Vitals:  Vitals:   02/10/22 0839 02/10/22 0938  BP: (!) 111/46 (!) 96/45  Pulse: 67 72  Resp: 16 14  Temp: 36.5 C (!) 36.4 C  SpO2: 22% 41%    Complications: No apparent anesthesia complications

## 2022-02-10 NOTE — Anesthesia Postprocedure Evaluation (Signed)
Anesthesia Post Note  Patient: Veronica Ellis  Procedure(s) Performed: COLONOSCOPY WITH PROPOFOL  Patient location during evaluation: PACU Anesthesia Type: General Level of consciousness: awake Pain management: pain level controlled Vital Signs Assessment: post-procedure vital signs reviewed and stable Respiratory status: spontaneous breathing Cardiovascular status: blood pressure returned to baseline Postop Assessment: no headache Anesthetic complications: no   No notable events documented.   Last Vitals:  Vitals:   02/10/22 1403 02/10/22 1610  BP: (!) 100/40 (!) 103/45  Pulse: 72 69  Resp: 18 18  Temp: 36.6 C 36.7 C  SpO2: 100% 97%    Last Pain:  Vitals:   02/10/22 1610  TempSrc: Oral  PainSc:                  Milinda Pointer

## 2022-02-10 NOTE — Progress Notes (Signed)
OT Cancellation Note  Patient Details Name: Veronica Ellis MRN: [id number] DOB: 1934-05-26   Cancelled Treatment:     Received OT order this date for evaluation.  Pt had colonoscopy this morning with anesthesia.   Will proceed with OT evaluation next date.    Ryken Paschal T Jack Bolio, OTR/L, CLT  02/10/2022, 1:35 PM

## 2022-02-10 NOTE — Progress Notes (Signed)
Report called to RN on [place], pt's assessments unchanged, family called and notified of the transfer. No distress

## 2022-02-10 NOTE — Anesthesia Preprocedure Evaluation (Signed)
Anesthesia Evaluation  Patient identified by MRN, date of birth, ID band Patient awake    Reviewed: Allergy & Precautions, NPO status , Patient's Chart, lab work & pertinent test results  History of Anesthesia Complications Negative for: history of anesthetic complications  Airway Mallampati: II  TM Distance: >3 FB Neck ROM: full    Dental  (+) Upper Dentures, Lower Dentures   Pulmonary COPD (mild), Current Smoker and Patient abstained from smoking., former smoker,  02/08/22 CXR: IMPRESSION: 1. No acute cardiopulmonary process. 2.  Aortic Atherosclerosis (ICD10-I70.0).   Pulmonary exam normal        Cardiovascular Exercise Tolerance: Good hypertension, Pt. on home beta blockers + CAD, + Past MI and +CHF (ef=30%)  Normal cardiovascular exam+ dysrhythmias Atrial Fibrillation   05/30/18: Echo LVEF 25%, with probable apical thrombus.  stress: 2018: LVEF= 40%  FINDINGS: Regional wall motion:demonstrateshypokinesis of the Entire myocardium. The overall quality of the study is fair. Artifacts noted: no Left ventricular cavity: enlarged.  Perfusion Analysis:SPECT images demonstrate small perfusion abnormality  of mild intensity is present in the inferior and apical myocardial region  on the stress images.There is normal myocardial perfusion and all  myocardial walls consistent with possible myocardial ischemia in the  inferior base and apex versus some diaphragmatic attenuation;  echO: 2018: MODERATE LV SYSTOLIC DYSFUNCTION (See above) WITH MODERATE LVH NORMAL RIGHT VENTRICULAR SYSTOLIC FUNCTION MILD VALVULAR REGURGITATION (See above) NO VALVULAR STENOSIS MILD MR, TR, PR EF 25-30%;    chronic severe systolic heart failure secondary to Non-ischemic.   Atrial fibrillation on Coumadin   Neuro/Psych PSYCHIATRIC DISORDERS Anxiety Bipolar Disorder Memory loss ? TIA (2016)   GI/Hepatic Neg liver ROS, GERD   Controlled,IBS   Endo/Other  negative endocrine ROS  Renal/GU CRFRenal disease  negative genitourinary   Musculoskeletal  (+) Arthritis , Back pain   Abdominal Normal abdominal exam  (+)   Peds  Hematology  (+) Blood dyscrasia, anemia , B Breast cancer > lumpectomy 2014;   Melena   Anesthesia Other Findings Pt had dizziness with anemia and melena. INR lowered to 1.6. Hbg at 7.9. Pt is hypotensive.   Reproductive/Obstetrics                             Anesthesia Physical  Anesthesia Plan  ASA: 4  Anesthesia Plan: General   Post-op Pain Management: Minimal or no pain anticipated   Induction: Intravenous  PONV Risk Score and Plan: Propofol infusion and TIVA  Airway Management Planned: Natural Airway and Simple Face Mask  Additional Equipment:   Intra-op Plan:   Post-operative Plan:   Informed Consent: I have reviewed the patients History and Physical, chart, labs and discussed the procedure including the risks, benefits and alternatives for the proposed anesthesia with the patient or authorized representative who has indicated his/her understanding and acceptance.   Patient has DNR.  Suspend DNR.   Dental advisory given  Plan Discussed with: CRNA and Anesthesiologist  Anesthesia Plan Comments:         Anesthesia Quick Evaluation

## 2022-02-10 NOTE — Progress Notes (Signed)
PROGRESS NOTE  Veronica Ellis MMC:[id number] DOB: 02-25-1934   PCP: Juline Patch, MD  Patient is from: Home.  Lives with his son.  Uses rolling walker at baseline.  DOA: 02/08/2022 LOS: 2  Chief complaints Chief Complaint  Patient presents with   Dizziness     Brief Narrative / Interim history: 88 year old F with PMH of systolic CHF, A-fib on warfarin, HTN, CKD-3A, HLD, hemorrhoids and mild cognitive impairment presenting with dizziness, chest tightness, melena and constipation, and admitted for symptomatic ABLA from GI bleed in the setting of supratherapeutic INR > 10.  Hgb 5.9 from 13.3 on 12/25/2021.  Transfused 2 units with appropriate response.  Received IV vitamin K for INR reversal.  GI consulted.  EGD and colonoscopy basically normal.  GI planning capsule endoscopy on 6/4.   In regards to anticoagulation, discussed about DOAC with pharmacy and Dr. Saralyn Pilar at [facility] via secure chat on 6/2.  No contraindication but Dr. Saralyn Pilar recommended resuming anticoagulation after outpatient follow-up at cardiology office.   Subjective: Seen and examined earlier this morning and later this afternoon.  No major events overnight of this morning.  No significant finding on colonoscopy this morning.  Denies melena or hematochezia.  Still with some lower abdominal pain and tenderness.  Also reports dysuria.  Objective: Vitals:   02/10/22 0946 02/10/22 0956 02/10/22 1403 02/10/22 1610  BP: (!) 111/42 (!) 116/46 (!) 100/40 (!) 103/45  Pulse: 66 64 72 69  Resp: '17 17 18 18  '$ Temp:   97.9 F (36.6 C) 98.1 F (36.7 C)  TempSrc:   Oral Oral  SpO2: 100% 100% 100% 97%  Weight:      Height:        Examination: GENERAL: No apparent distress.  Nontoxic. HEENT: MMM.  Vision and hearing grossly intact.  NECK: Supple.  No apparent JVD.  RESP:  No IWOB.  Fair aeration bilaterally. CVS:  RRR. Heart sounds normal.  ABD/GI/GU: BS+. Abd soft.  Suprapubic tenderness. MSK/EXT:  Moves extremities.  No apparent deformity. No edema.  SKIN: no apparent skin lesion or wound NEURO: Awake and alert.  Fairly oriented.  No apparent focal neuro deficit. PSYCH: Calm. Normal affect.   Procedures:  6/2-EGD normal. 6/3-colonoscopy normal.  Microbiology summarized: None  Assessment and Plan: * Symptomatic ABLA due to GIB in the setting of supratherapeutic INR Recent Labs    02/08/22 1653 02/09/22 0638 02/10/22 1113  HGB 5.9* 7.9* 7.1*  Hgb 13.3 on 12/25/2021.  INR > 10 on arrival.  Down to 1.6 after IV vitamin K.  useShe reports melena prior to presentation. Denies NSAID. Transfused 2 units (558 cc) and Hgb improved to 7.9. No further bleeding here.  Soft blood pressure but not symptomatic.  No further bleed here.  EGD and colonoscopy without significant finding.  Hgb down to 7.1, likely equilibrium -Continue PPI -Recheck CBC this afternoon.  May transfuse for a goal Hgb > 8.0 given history of CAD. -Clear liquid diet -Continue holding warfarin on discharge.  Cardiology to consider [place] outpatient. -Capsule endoscopy on 6/4.  Goals of care, counseling/discussion DNR/DNI.  See discussion on 6/2  CKD-3A Recent Labs    02/08/22 1653 02/09/22 0638 02/10/22 1113  BUN 45* 42* 21  CREATININE 1.31* 1.20* 1.06*  Creatinine better here.  Continue monitoring   Coagulopathy/supratherapeutic INR on warfarin Patient denies recent change to her warfarin.  INR > 10 on arrival>> 1.6 after IV vitamin K -Asked pharmacy to review her medications to ensure there was no  interaction -Discontinue warfarin on discharge.  Cardiology to consider DOAC at next follow-up outpatient.  Urinary tract infection Reports dysuria and lower abdominal pain.  She has suprapubic tenderness. -Bladder scan as needed -Monitor intake and output -Urinalysis and urine culture -Ceftriaxone after urine sample collection  Paroxysmal A-fib (HCC) Currently in sinus rhythm.  Rate controlled. -Holding warfarin.  Discontinue  on discharge.  Audiology to consider [place] next follow-up outpatient -Holding Toprol-XL in the setting of hypotension.  Essential hypertension Slightly hypotensive. Does not seem to be symptomatic.  -Hold home cardiac meds -Transfuse as appropriate  Chronic systolic CHF (congestive heart failure) (HCC) TTE in 2019 with LVEF of 20 to 25%, diffuse hypokinesis without significant valvular disease.  Seems to be on Demadex, Aldactone, Entresto and Toprol-XL.  Appears euvolemic on exam.  CXR and serial troponin negative.  Soft blood pressure. -Hold home cardiac medications.  Discontinued Toprol-XL. -Monitor I&O, daily weight, renal functions and electrolytes. -Continue Lipitor and Zetia  Generalized weakness Likely from anemia. -Treat anemia as above -PT/OT eval  Dyslipidemia - We will continue statin therapy.  3-vessel CAD Stable.  Had mild chest tightness on arrival likely from anemia.  EKG and troponin without acute finding. -Continue statin and Zetia. -Holding other cardiac meds in the setting of hypotension -Transfuse for Hgb less than 8  Anxiety and depression Stable.  Continue home meds.  Cerebrovascular disease Stable.  Continue statin       DVT prophylaxis:  SCDs Start: 02/08/22 1929  Code Status: DNR/DNI Family Communication: Updated patient's son at bedside early in the morning. Level of care: Med-Surg Status is: Inpatient Remains inpatient appropriate because: Symptomatic ABLA due to GI bleed in the setting of supratherapeutic INR and possible UTI   Final disposition: TBD after PT/OT evaluation Consultants:  Gastroenterology Cardiology   Sch Meds:  Scheduled Meds:  sodium chloride   Intravenous Once   atorvastatin  80 mg Oral Daily   buPROPion  100 mg Oral BID   ezetimibe  10 mg Oral Daily   [START ON 02/12/2022] pantoprazole  40 mg Intravenous Q12H   Continuous Infusions:  sodium chloride 100 mL/hr at 02/10/22 1422   sodium chloride     cefTRIAXone  (ROCEPHIN)  IV     pantoprazole 8 mg/hr (02/10/22 1415)   PRN Meds:.acetaminophen **OR** acetaminophen, ondansetron **OR** ondansetron (ZOFRAN) IV, traZODone  Antimicrobials: Anti-infectives (From admission, onward)    Start     Dose/Rate Route Frequency Ordered Stop   02/10/22 1700  cefTRIAXone (ROCEPHIN) 1 g in sodium chloride 0.9 % 100 mL IVPB        1 g 200 mL/hr over 30 Minutes Intravenous Every 24 hours 02/10/22 1614 02/15/22 1659        I have personally reviewed the following labs and images: CBC: Recent Labs  Lab 02/08/22 1653 02/09/22 0638 02/10/22 1113 02/10/22 1517  WBC 15.6* 11.0* 6.8 7.6  HGB 5.9* 7.9* 7.1* 7.0*  HCT 19.3* 23.0* 22.2* 21.8*  MCV 100.0 89.8 95.3 94.8  PLT 288 213 198 206   BMP &GFR Recent Labs  Lab 02/08/22 1653 02/09/22 0638 02/10/22 1113  NA 138 140 140  K 4.0 3.7 3.5  CL 111 114* 114*  CO2 '22 22 23  '$ GLUCOSE 149* 106* 91  BUN 45* 42* 21  CREATININE 1.31* 1.20* 1.06*  CALCIUM 8.1* 7.8* 7.3*  MG  --   --  1.9  PHOS  --   --  3.4   Estimated Creatinine Clearance: 39.4 mL/min (A) (by  C-G formula based on SCr of 1.06 mg/dL (H)). Liver & Pancreas: Recent Labs  Lab 02/08/22 1735 02/10/22 1113  AST 22  --   ALT 21  --   ALKPHOS 42  --   BILITOT 0.6  --   PROT 5.3*  --   ALBUMIN 2.8* 2.5*   No results for input(s): LIPASE, AMYLASE in the last 168 hours. No results for input(s): AMMONIA in the last 168 hours. Diabetic: No results for input(s): HGBA1C in the last 72 hours. No results for input(s): GLUCAP in the last 168 hours. Cardiac Enzymes: No results for input(s): CKTOTAL, CKMB, CKMBINDEX, TROPONINI in the last 168 hours. No results for input(s): PROBNP in the last 8760 hours. Coagulation Profile: Recent Labs  Lab 02/08/22 1735 02/09/22 0638 02/10/22 0453  INR >10.0* 1.6* 1.4*   Thyroid Function Tests: No results for input(s): TSH, T4TOTAL, FREET4, T3FREE, THYROIDAB in the last 72 hours. Lipid Profile: No results  for input(s): CHOL, HDL, LDLCALC, TRIG, CHOLHDL, LDLDIRECT in the last 72 hours. Anemia Panel: Recent Labs    02/10/22 1113  FOLATE 20.3  FERRITIN 47  TIBC 274  IRON 30   Urine analysis:    Component Value Date/Time   COLORURINE COLORLESS (A) 03/04/2020 1434   APPEARANCEUR CLEAR (A) 03/04/2020 1434   APPEARANCEUR Hazy 04/29/2014 1810   LABSPEC 1.018 03/04/2020 1434   LABSPEC 1.023 04/29/2014 1810   PHURINE 5.0 03/04/2020 1434   GLUCOSEU NEGATIVE 03/04/2020 1434   GLUCOSEU Negative 04/29/2014 1810   HGBUR NEGATIVE 03/04/2020 1434   BILIRUBINUR NEGATIVE 03/04/2020 1434   BILIRUBINUR Negative 04/29/2014 1810   KETONESUR NEGATIVE 03/04/2020 1434   PROTEINUR NEGATIVE 03/04/2020 1434   NITRITE NEGATIVE 03/04/2020 1434   LEUKOCYTESUR NEGATIVE 03/04/2020 1434   LEUKOCYTESUR Trace 04/29/2014 1810   Sepsis Labs: Invalid input(s): PROCALCITONIN, [place]  Microbiology: No results found for this or any previous visit (from the past 240 hour(s)).  Radiology Studies: No results found.    Marcellas Marchant T. [place]  If 7PM-7AM, please contact night-coverage [email or web address] 02/10/2022, 4:20 PM

## 2022-02-11 ENCOUNTER — Encounter
Admission: EM | Disposition: A | Discharge status: Home Health | Payer: Self-pay | Source: Home / Self Care | Attending: Student

## 2022-02-11 DIAGNOSIS — D689 Coagulation defect, unspecified: Secondary | ICD-10-CM | POA: Diagnosis not present

## 2022-02-11 DIAGNOSIS — N1832 Chronic kidney disease, stage 3b: Secondary | ICD-10-CM | POA: Diagnosis not present

## 2022-02-11 DIAGNOSIS — D649 Anemia, unspecified: Secondary | ICD-10-CM | POA: Diagnosis not present

## 2022-02-11 DIAGNOSIS — D62 Acute posthemorrhagic anemia: Secondary | ICD-10-CM | POA: Diagnosis not present

## 2022-02-11 HISTORY — PX: GIVENS CAPSULE STUDY: SHX5432

## 2022-02-11 LAB — TYPE AND SCREEN
ABO/RH(D): O POS
Antibody Screen: NEGATIVE
Unit division: 0
Unit division: 0
Unit division: 0

## 2022-02-11 LAB — CBC
HCT: 24.1 % — ABNORMAL LOW (ref 36.0–46.0)
Hemoglobin: 7.9 g/dL — ABNORMAL LOW (ref 12.0–15.0)
MCH: 30.2 pg (ref 26.0–34.0)
MCHC: 32.8 g/dL (ref 30.0–36.0)
MCV: 92 fL (ref 80.0–100.0)
Platelets: 195 10*3/uL (ref 150–400)
RBC: 2.62 MIL/uL — ABNORMAL LOW (ref 3.87–5.11)
RDW: 18.6 % — ABNORMAL HIGH (ref 11.5–15.5)
WBC: 8.2 10*3/uL (ref 4.0–10.5)
nRBC: 0 % (ref 0.0–0.2)

## 2022-02-11 LAB — BPAM RBC
Blood Product Expiration Date: 202306062359
Blood Product Expiration Date: 202306302359
Blood Product Expiration Date: 202306302359
ISSUE DATE / TIME: 202306012257
ISSUE DATE / TIME: 202306020209
ISSUE DATE / TIME: 202306031758
Unit Type and Rh: 5100
Unit Type and Rh: 5100
Unit Type and Rh: 9500

## 2022-02-11 LAB — RENAL FUNCTION PANEL
Albumin: 2.3 g/dL — ABNORMAL LOW (ref 3.5–5.0)
Anion gap: 4 — ABNORMAL LOW (ref 5–15)
BUN: 15 mg/dL (ref 8–23)
CO2: 21 mmol/L — ABNORMAL LOW (ref 22–32)
Calcium: 7.1 mg/dL — ABNORMAL LOW (ref 8.9–10.3)
Chloride: 116 mmol/L — ABNORMAL HIGH (ref 98–111)
Creatinine, Ser: 1.09 mg/dL — ABNORMAL HIGH (ref 0.44–1.00)
GFR, Estimated: 49 mL/min — ABNORMAL LOW (ref >60)
Glucose, Bld: 94 mg/dL (ref 70–99)
Phosphorus: 3.3 mg/dL (ref 2.5–4.6)
Potassium: 3.7 mmol/L (ref 3.5–5.1)
Sodium: 141 mmol/L (ref 135–145)

## 2022-02-11 LAB — MAGNESIUM: Magnesium: 1.8 mg/dL (ref 1.7–2.4)

## 2022-02-11 LAB — CORTISOL: Cortisol, Plasma: 11.1 ug/dL

## 2022-02-11 LAB — PROTIME-INR
INR: 1.4 — ABNORMAL HIGH (ref 0.8–1.2)
Prothrombin Time: 17.2 seconds — ABNORMAL HIGH (ref 11.4–15.2)

## 2022-02-11 LAB — TSH: TSH: 3.246 u[IU]/mL (ref 0.350–4.500)

## 2022-02-11 SURGERY — IMAGING PROCEDURE, GI TRACT, INTRALUMINAL, VIA CAPSULE

## 2022-02-11 MED ORDER — METOCLOPRAMIDE HCL 5 MG/ML IJ SOLN: 10.0000 mg | Freq: Once | INTRAMUSCULAR | Status: DC

## 2022-02-11 MED ORDER — DIPHENHYDRAMINE HCL 50 MG/ML IJ SOLN: 25.0000 mg | Freq: Once | INTRAMUSCULAR | Status: DC

## 2022-02-11 MED ORDER — SUMATRIPTAN SUCCINATE 6 MG/0.5ML ~~LOC~~ SOLN
6.0000 mg | Freq: Once | SUBCUTANEOUS | Status: DC
  Filled 2022-02-11: qty 0.5

## 2022-02-11 NOTE — Evaluation (Addendum)
Physical Therapy Evaluation Patient Details Name: Veronica Ellis MRN: [id number] DOB: 1933-12-10 Today's Date: 02/11/2022  History of Present Illness  Pt is an 88 year old female with PMH including CHF, atrial fibrillation, CKD, depression, and mild cognitive impairment.  She presented to the ED on 02/08/22 with dizziness, chest tightness, melena, and constipation.  Pt admitted for ABLA from GI bleed, s/p blood transfusions and essentially normal colonoscopy on 02/10/22.  Pt scheduled for endoscopy on 02/11/22.  Clinical Impression  Veronica Ellis is a pleasant pt who enjoys yelling loud to alarm everyone. Pt receive din bed with Son by her side who agreed to participate in therapy. Pt with abdominal camera to prepare  for Endoscopy. Pt afraid of falling and dropping all the monitoring devise. Pt demonstrates weakness, balance deficits. Pt performed Bed mobility with Max assist, STS transfer with higher bed level with Max of 1, took 4 steps side ways on both sides with Mod assist of  1. Pt maintained SPO2>95% at all times during entire assessment.  Pt has 13 steps to go to her bed room at home and 4 steps to enter her home. Pt will benefit from SNF to return home with Son safely.      Recommendations for follow up therapy are one component of a multi-disciplinary discharge planning process, led by the attending physician.  Recommendations may be updated based on patient status, additional functional criteria and insurance authorization.  Follow Up Recommendations Skilled nursing-short term rehab (<3 hours/day)    Assistance Recommended at Discharge Frequent or constant Supervision/Assistance  Patient can return home with the following  A lot of help with walking and/or transfers;A lot of help with bathing/dressing/bathroom;Assistance with cooking/housework;Direct supervision/assist for medications management;Direct supervision/assist for financial management;Assist for transportation;Help with stairs or  ramp for entrance    Equipment Recommendations Rolling walker (2 wheels);BSC/3in1  Recommendations for Other Services       Functional Status Assessment Patient has had a recent decline in their functional status and demonstrates the ability to make significant improvements in function in a reasonable and predictable amount of time.     Precautions / Restrictions Precautions Precautions: Fall Restrictions Weight Bearing Restrictions: No      Mobility  Bed Mobility Overal bed mobility: Needs Assistance Bed Mobility: Rolling, Supine to Sit, Sit to Supine Rolling: Max assist   Supine to sit: Max assist Sit to supine: Max assist   General bed mobility comments:  (pt has abdominal camaera and holter moniotr limiting her mobility.)    Transfers Overall transfer level: Needs assistance Equipment used: Rolling walker (2 wheels) Transfers: Sit to/from Stand Sit to Stand: Max assist                Ambulation/Gait Ambulation/Gait assistance: Mod assist Gait Distance (Feet): 4 Feet (side steps) Assistive device: Rolling walker (2 wheels)   Gait velocity: decreased        Stairs            Wheelchair Mobility    Modified Rankin (Stroke Patients Only)       Balance Overall balance assessment: Needs assistance Sitting-balance support: Feet supported       Standing balance support: Bilateral upper extremity supported Standing balance-Leahy Scale: Fair                               Pertinent Vitals/Pain Pain Assessment Pain Assessment: No/denies pain    Home Living Family/patient expects to be  discharged to:: Private residence Living Arrangements: Children Available Help at Discharge: Family Type of Home: House Home Access: Stairs to enter Entrance Stairs-Rails: Left Entrance Stairs-Number of Steps: 5 (+13 steps to bedroom) Alternate Level Stairs-Number of Steps: split level with "several" steps to upstairs bedroom Home Layout:  Multi-level;Bed/bath upstairs Home Equipment: Conservation officer, nature (2 wheels);Cane - quad;Standard Gilford Rile;Shower seat      Prior Function Prior Level of Function : History of Falls (last six months);Independent/Modified Independent             Mobility Comments: Pt is independent with mobility at baseline, does not use AD ADLs Comments: Independent at baseline, receives assistance from son for IADLs including cooking, cleaning, driving, finances, and medications     Hand Dominance   Dominant Hand: Right    Extremity/Trunk Assessment   Upper Extremity Assessment Upper Extremity Assessment: Generalized weakness    Lower Extremity Assessment Lower Extremity Assessment: Generalized weakness       Communication   Communication: HOH  Cognition Arousal/Alertness: Awake/alert Behavior During Therapy: WFL for tasks assessed/performed Overall Cognitive Status: History of cognitive impairments - at baseline                                 General Comments: History of mild cognitive impairment, though A&Ox4        General Comments      Exercises     Assessment/Plan    PT Assessment Patient needs continued PT services  PT Problem List Decreased strength;Decreased activity tolerance;Decreased balance;Decreased mobility;Decreased cognition;Decreased safety awareness       PT Treatment Interventions Gait training;Stair training;Functional mobility training;Therapeutic activities;Therapeutic exercise;Balance training;Neuromuscular re-education;Cognitive remediation;Patient/family education    PT Goals (Current goals can be found in the Care Plan section)  Acute Rehab PT Goals Patient Stated Goal: " I want to get better." PT Goal Formulation: With patient/family Time For Goal Achievement: 02/25/22 Potential to Achieve Goals: Fair    Frequency Min 2X/week     Co-evaluation               AM-PAC PT "6 Clicks" Mobility  Outcome Measure Help needed  turning from your back to your side while in a flat bed without using bedrails?: A Lot Help needed moving from lying on your back to sitting on the side of a flat bed without using bedrails?: A Lot Help needed moving to and from a bed to a chair (including a wheelchair)?: A Lot Help needed standing up from a chair using your arms (e.g., wheelchair or bedside chair)?: A Lot Help needed to walk in hospital room?: A Lot Help needed climbing 3-5 steps with a railing? : Total 6 Click Score: 11    End of Session Equipment Utilized During Treatment: Gait belt Activity Tolerance: Treatment limited secondary to medical complications (Comment) (testing in progress and pt afraid) Patient left: in bed;with call bell/phone within reach;with bed alarm set;with family/visitor present Nurse Communication: Mobility status;Precautions PT Visit Diagnosis: Unsteadiness on feet (R26.81);Muscle weakness (generalized) (M62.81);History of falling (Z91.81);Difficulty in walking, not elsewhere classified (R26.2)    Time: 4132-4401 PT Time Calculation (min) (ACUTE ONLY): 38 min   Charges:   PT Evaluation $PT Eval Moderate Complexity: 1 Mod PT Treatments $Gait Training: 8-22 mins $Therapeutic Activity: 23-37 mins        Martrell Eguia PT DPT 2:20 PM,02/11/22   Zadyn Yardley H Maeven Mcdougall 02/11/2022, 2:14 PM

## 2022-02-11 NOTE — Progress Notes (Signed)
PROGRESS NOTE  Veronica Ellis DSK:[id number] DOB: 1933/11/07   PCP: Juline Patch, MD  Patient is from: Home.  Lives with his son.  Uses rolling walker at baseline.  DOA: 02/08/2022 LOS: 3  Chief complaints Chief Complaint  Patient presents with   Dizziness     Brief Narrative / Interim history: 88 year old F with PMH of systolic CHF, A-fib on warfarin, HTN, CKD-3A, HLD, hemorrhoids and mild cognitive impairment presenting with dizziness, chest tightness, melena and constipation, and admitted for symptomatic ABLA from GI bleed in the setting of supratherapeutic INR > 10.  Hgb 5.9 from 13.3 on 12/25/2021.  Transfused 2 units with appropriate response.  Received IV vitamin K for INR reversal.  GI consulted.  EGD and colonoscopy basically normal.  Capsule endoscopy underway.  Patient has dysuria and suprapubic tenderness.  UA and urine culture ordered.  Started on IV ceftriaxone  In regards to anticoagulation, discussed about DOAC with pharmacy and Dr. Saralyn Pilar at [facility] via secure chat on 6/2.  No contraindication but Dr. Saralyn Pilar recommended resuming anticoagulation after outpatient follow-up at cardiology office.  Therapy recommended SNF but patient and family prefers to go home with home health and DME (ordered).    Subjective: Seen and examined earlier this morning.  No major events overnight of this morning.  She reports intermittent abdominal pain.  Improved.  Confused but redirectable.  Objective: Vitals:   02/10/22 2055 02/11/22 0454 02/11/22 0820 02/11/22 1621  BP: (!) 124/56 (!) 106/41 (!) 103/49 (!) 117/37  Pulse: 69 69 66 72  Resp: '18 16 18 18  '$ Temp: 98.2 F (36.8 C) 98.5 F (36.9 C) 98.4 F (36.9 C) 98.1 F (36.7 C)  TempSrc: Oral  Oral Oral  SpO2: 98% 99% 100% 96%  Weight:      Height:        Examination:  GENERAL: No apparent distress.  Nontoxic. HEENT: MMM.  Vision and hearing grossly intact.  NECK: Supple.  No apparent JVD.  RESP:  No IWOB.  Fair  aeration bilaterally. CVS:  RRR. Heart sounds normal.  ABD/GI/GU: BS+. Abd soft.  Some suprapubic tenderness.  No rebound MSK/EXT:  Moves extremities. No apparent deformity. No edema.  SKIN: no apparent skin lesion or wound NEURO: Awake and alert. Oriented fairly.  No apparent focal neuro deficit. PSYCH: Appears a little anxious.  Procedures:  6/2-EGD normal. 6/3-colonoscopy normal. 6/4-capsule endoscopy started  Microbiology summarized: 6/4-urine culture pending  Assessment and Plan: * Symptomatic ABLA due to GIB in the setting of supratherapeutic INR Recent Labs    02/08/22 1653 02/09/22 0638 02/10/22 1113 02/10/22 1517 02/11/22 0438  HGB 5.9* 7.9* 7.1* 7.0* 7.9*  Hgb 13.3 on 12/25/2021.  INR > 10 on arrival.  Down to 1.4 after IV vitamin K. She reports melena prior to presentation. Denies NSAID.  Transfused 3 units so far.  Hgb improved to 7.9. No further bleeding here.  Soft blood pressure but not symptomatic. EGD and colonoscopy without significant finding.  -Continue PPI -Recheck CBC in the afternoon -Discontinue IV fluids -Discontinue warfarin on discharge.  Cardiology to consider [place] outpatient. -Capsule endoscopy underway.  Goals of care, counseling/discussion DNR/DNI.  See discussion on 6/2  Hypotension No improvement with IV fluid.  Does not seem to be symptomatic.  Last TTE in 2019 with LVEF of 20 to 25%.  -Check limited TTE -Discontinue IV fluid -Check TSH and cortisol -Continue holding home cardiac meds.  Coagulopathy/supratherapeutic INR on warfarin Patient denies recent change to her warfarin.  INR >  10 on arrival>> 1.4 after IV vitamin K -Asked pharmacy to review her medications to ensure there was no interaction -Discontinue warfarin on discharge.  Cardiology to consider DOAC at next follow-up outpatient.  Urinary tract infection Reports dysuria and lower abdominal pain.  She has suprapubic tenderness. -Bladder scan as needed -Monitor intake and  output -Urinalysis and urine culture -Ceftriaxone after urine sample collection  Paroxysmal A-fib (HCC) Currently in sinus rhythm.  Rate controlled. -Holding warfarin.  Discontinue on discharge.  Audiology to consider [place] next follow-up outpatient -Holding Toprol-XL in the setting of hypotension.  CKD-3A Recent Labs    02/08/22 1653 02/09/22 0638 02/10/22 1113 02/11/22 0438  BUN 45* 42* 21 15  CREATININE 1.31* 1.20* 1.06* 1.09*  Creatinine better here.  Continue monitoring   Chronic systolic CHF (congestive heart failure) ([place]) TTE in 2019 with LVEF of 20 to 25%, diffuse hypokinesis without significant valvular disease.  Seems to be on Demadex, Aldactone, Entresto and Toprol-XL.  Appears euvolemic on exam.  CXR and serial troponin negative.  Soft BP.  Net +1.6 L. -Discontinue IV fluid -Continue holding home cardiac meds. -Limited echocardiogram -Monitor I&O, daily weight, renal functions and electrolytes. -Continue Lipitor and Zetia  Generalized weakness Multifactorial.  Therapy recommended SNF but patient and family prefers to go home with home health and DME. -HH and DME ordered.  Dyslipidemia - We will continue statin therapy.  3-vessel CAD Stable.  Had mild chest tightness on arrival likely from anemia.  EKG and troponin without acute finding. -Continue statin and Zetia. -Holding other cardiac meds in the setting of hypotension -Transfuse for Hgb less than 8  Anxiety and depression Stable.  Continue home meds.  Cerebrovascular disease Stable.  Continue statin       DVT prophylaxis:  SCDs Start: 02/08/22 1929  Code Status: DNR/DNI Family Communication: Updated patient's son at bedside early in the morning. Level of care: Med-Surg Status is: Inpatient Remains inpatient appropriate because: Symptomatic ABLA due to GI bleed in the setting of supratherapeutic INR and possible UTI   Final disposition: Home with home health and DME in the next 24 to 48  hours Consultants:  Gastroenterology Cardiology   Sch Meds:  Scheduled Meds:  atorvastatin  80 mg Oral Daily   buPROPion  100 mg Oral BID   ezetimibe  10 mg Oral Daily   [START ON 02/12/2022] pantoprazole  40 mg Intravenous Q12H   Continuous Infusions:  sodium chloride     cefTRIAXone (ROCEPHIN)  IV 1 g (02/11/22 1617)   pantoprazole 8 mg/hr (02/11/22 0127)   PRN Meds:.acetaminophen **OR** acetaminophen, ondansetron **OR** ondansetron (ZOFRAN) IV, traZODone  Antimicrobials: Anti-infectives (From admission, onward)    Start     Dose/Rate Route Frequency Ordered Stop   02/10/22 1700  cefTRIAXone (ROCEPHIN) 1 g in sodium chloride 0.9 % 100 mL IVPB        1 g 200 mL/hr over 30 Minutes Intravenous Every 24 hours 02/10/22 1614 02/15/22 1659        I have personally reviewed the following labs and images: CBC: Recent Labs  Lab 02/08/22 1653 02/09/22 0638 02/10/22 1113 02/10/22 1517 02/11/22 0438  WBC 15.6* 11.0* 6.8 7.6 8.2  HGB 5.9* 7.9* 7.1* 7.0* 7.9*  HCT 19.3* 23.0* 22.2* 21.8* 24.1*  MCV 100.0 89.8 95.3 94.8 92.0  PLT 288 213 198 206 195   BMP &GFR Recent Labs  Lab 02/08/22 1653 02/09/22 0638 02/10/22 1113 02/11/22 0438  NA 138 140 140 141  K 4.0 3.7  3.5 3.7  CL 111 114* 114* 116*  CO2 '22 22 23 '$ 21*  GLUCOSE 149* 106* 91 94  BUN 45* 42* 21 15  CREATININE 1.31* 1.20* 1.06* 1.09*  CALCIUM 8.1* 7.8* 7.3* 7.1*  MG  --   --  1.9 1.8  PHOS  --   --  3.4 3.3   Estimated Creatinine Clearance: 38.3 mL/min (A) (by C-G formula based on SCr of 1.09 mg/dL (H)). Liver & Pancreas: Recent Labs  Lab 02/08/22 1735 02/10/22 1113 02/11/22 0438  AST 22  --   --   ALT 21  --   --   ALKPHOS 42  --   --   BILITOT 0.6  --   --   PROT 5.3*  --   --   ALBUMIN 2.8* 2.5* 2.3*   No results for input(s): LIPASE, AMYLASE in the last 168 hours. No results for input(s): AMMONIA in the last 168 hours. Diabetic: No results for input(s): HGBA1C in the last 72 hours. No  results for input(s): GLUCAP in the last 168 hours. Cardiac Enzymes: No results for input(s): CKTOTAL, CKMB, CKMBINDEX, TROPONINI in the last 168 hours. No results for input(s): PROBNP in the last 8760 hours. Coagulation Profile: Recent Labs  Lab 02/08/22 1735 02/09/22 0638 02/10/22 0453 02/11/22 0438  INR >10.0* 1.6* 1.4* 1.4*   Thyroid Function Tests: Recent Labs    02/11/22 0800  TSH 3.246   Lipid Profile: No results for input(s): CHOL, HDL, LDLCALC, TRIG, CHOLHDL, LDLDIRECT in the last 72 hours. Anemia Panel: Recent Labs    02/10/22 1113  VITAMINB12 209  FOLATE 20.3  FERRITIN 47  TIBC 274  IRON 30   Urine analysis:    Component Value Date/Time   COLORURINE COLORLESS (A) 03/04/2020 1434   APPEARANCEUR CLEAR (A) 03/04/2020 1434   APPEARANCEUR Hazy 04/29/2014 1810   LABSPEC 1.018 03/04/2020 1434   LABSPEC 1.023 04/29/2014 1810   PHURINE 5.0 03/04/2020 1434   GLUCOSEU NEGATIVE 03/04/2020 1434   GLUCOSEU Negative 04/29/2014 1810   HGBUR NEGATIVE 03/04/2020 1434   BILIRUBINUR NEGATIVE 03/04/2020 1434   BILIRUBINUR Negative 04/29/2014 [place] 03/04/2020 1434   PROTEINUR NEGATIVE 03/04/2020 1434   NITRITE NEGATIVE 03/04/2020 1434   LEUKOCYTESUR NEGATIVE 03/04/2020 1434   LEUKOCYTESUR Trace 04/29/2014 1810   Sepsis Labs: Invalid input(s): PROCALCITONIN, [place]  Microbiology: No results found for this or any previous visit (from the past 240 hour(s)).  Radiology Studies: No results found.    Mendy Lapinsky T. Milam  If 7PM-7AM, please contact night-coverage [email or web address] 02/11/2022, 4:43 PM

## 2022-02-11 NOTE — TOC Initial Note (Signed)
Transition of Care [facility]) - Initial/Assessment Note    Patient Details  Name: Veronica Ellis MRN: [id number] Date of Birth: 1934-01-05  [facility] CM/SW Contact:    Magnus Ivan, LCSW Phone Number: 02/11/2022, 3:28 PM  Clinical Narrative:                CSW spoke to patient's son Percell Miller regarding SNF rec. Patient lives with son who provides 24 hour care at home. He refuses SNF. He says he can meet patient's needs at home. He drives her to appointments.  PCP is Dr. Ronnald Ramp. [facility]acy [facility]. Patient has a raised toilet seat, shower chair, and cane at home. Percell Miller states he recently bought a walker but feels she needs a different one. RW ordered through [employer] per Edward's request.  Percell Miller stated he is going to move patient's bedroom downstairs for safety and will work on moving her stuff downstairs now. He is agreeable to Home Health. No agency preference. Referral made to [facility] with [facility].   Expected Discharge Plan: [place] Barriers to Discharge: Continued Medical Work up   Patient Goals and CMS Choice Patient states their goals for this hospitalization and ongoing recovery are:: home with home health CMS Medicare.gov Compare Post Acute Care list provided to:: Patient Represenative (must comment) Choice offered to / list presented to : Adult Children  Expected Discharge Plan and Services Expected Discharge Plan: [place]       Living arrangements for the past 2 months: Single Family Home                 DME Arranged: Walker rolling DME Agency: [employer] Date DME Agency Contacted: 02/11/22   Representative spoke with at DME Agency: [place]: PT, OT, RN, Nurse's Aide [place] Agency: [facility] Date [facility] Agency Contacted: 02/11/22   Representative spoke with at [place]: Merleen Nicely  Prior Living Arrangements/Services Living arrangements for the past 2 months: [place] Lives with:: Adult Children Patient language and need for interpreter reviewed:: Yes Do you feel safe going back to the place where you live?: Yes      Need for Family Participation in Patient Care: Yes (Comment) Care giver support system in place?: Yes (comment) Current home services: DME Criminal Activity/Legal Involvement Pertinent to Current Situation/Hospitalization: No - Comment as needed  Activities of Daily Living Home Assistive Devices/Equipment: None ADL Screening (condition at time of admission) Patient's cognitive ability adequate to safely complete daily activities?: Yes Is the patient deaf or have difficulty hearing?: Yes Does the patient have difficulty seeing, even when wearing glasses/contacts?: Yes Does the patient have difficulty concentrating, remembering, or making decisions?: No Patient able to express need for assistance with ADLs?: Yes Does the patient have difficulty dressing or bathing?: Yes Independently performs ADLs?: No Communication: Independent with device (comment), Needs assistance Is this a change from baseline?: Pre-admission baseline Dressing (OT): Independent with device (comment) Grooming: Independent Feeding: Independent Bathing: Needs assistance Toileting: Needs assistance In/Out Bed: Needs assistance Walks in Home: Independent with device (comment), Needs assistance Is this a change from baseline?: Pre-admission baseline Does the patient have difficulty walking or climbing stairs?: Yes Weakness of Legs: Both Weakness of Arms/Hands: None  Permission Sought/Granted Permission sought to share information with : Facility Art therapist granted to share information with : Yes, Verbal Permission Granted (by son Percell Miller)     Permission granted to share info  w AGENCY: HH and DME agencies        Emotional Assessment         Alcohol / Substance Use: Not Applicable Psych Involvement: No (comment)  Admission  diagnosis:  GI bleeding [K92.2] Acute upper GI bleed [K92.2] Supratherapeutic INR [R79.1] Symptomatic anemia [D64.9] Patient Active Problem List   Diagnosis Date Noted   Symptomatic ABLA due to GIB in the setting of supratherapeutic INR 02/09/2022   Supratherapeutic INR 02/09/2022   Goals of care, counseling/discussion 02/09/2022   Paroxysmal A-fib ([place]) 02/09/2022   Urinary tract infection 02/09/2022   Generalized weakness 02/09/2022   GI bleeding 02/08/2022   Coagulopathy/supratherapeutic INR on warfarin 02/08/2022   CKD-3A 02/08/2022   Dyslipidemia 02/08/2022   Depression 02/08/2022   Essential hypertension 02/08/2022   GERD without esophagitis 08/31/2019   History of IBS 09/26/2018   Long term (current) use of anticoagulants 07/17/2018   3-vessel CAD 07/08/2018   Acute on chronic combined systolic and diastolic CHF (congestive heart failure) ([place]) 07/08/2018   Ischemic cardiomyopathy 07/08/2018   AKI (acute kidney injury) ([place]) 06/06/2018   Hyperkalemia 06/05/2018   Atrial fibrillation with RVR ([place]) 05/21/2018   Symptomatic anemia 05/21/2018   NSTEMI (non-ST elevated myocardial infarction) ([place]) 97/94/8016   Chronic systolic CHF (congestive heart failure) ([place]) 07/30/2017   LBBB (left bundle branch block) 07/10/2017   Lumbar radiculopathy 05/16/2017   Hx of low back pain 05/14/2017   Ataxia 12/31/2016   Memory loss 12/31/2016   Recurrent major depressive disorder, in partial remission ([place]) 12/31/2016   Bipolar 1 disorder, mixed, moderate ([place]) 12/25/2016   Chronic bilateral low back pain with bilateral sciatica 10/18/2016   Bilateral breast cancer ([place]) 02/20/2016   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   Cerebrovascular disease 02/16/2015   Anxiety and depression 02/16/2015   Breast lump 02/16/2015   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   Tobacco abuse, in remission 02/16/2015   Routine general medical examination at a health care facility  02/16/2015   Recurrent major depressive episodes ([place]) 02/16/2015   Below normal amount of sodium in the blood 02/16/2015   Pre-operative cardiovascular examination 02/16/2015   PCP:  Juline Patch, MD Pharmacy:   [id number] [place] [place] [facility] [address] [address]Phone: [phone]: [phone] [place]Phone: [phone]: [phone]    Social Determinants of Health (SDOH) Interventions    Readmission Risk Interventions    02/11/2022    3:23 PM  Readmission Risk Prevention Plan  Transportation Screening Complete  PCP or Specialist Appt within 3-5 Days Complete  HRI or [place]  Social Work Consult for [place]anning/Counseling Complete  Palliative Care Screening Not Applicable  Medication Review (Press photographer Complete

## 2022-02-11 NOTE — Evaluation (Signed)
Occupational Therapy Evaluation Patient Details Name: Veronica Ellis MRN: [id number] DOB: 10-Jan-1934 Today's Date: 02/11/2022   History of Present Illness Pt is an 88 year old female with PMH including CHF, atrial fibrillation, CKD, depression, and mild cognitive impairment.  She presented to the ED on 02/08/22 with dizziness, chest tightness, melena, and constipation.  Pt admitted for ABLA from GI bleed, s/p blood transfusions and essentially normal colonoscopy on 02/10/22.  Pt scheduled for endoscopy on 02/11/22.   Clinical Impression   Veronica Ellis presents with generalized weakness and low endurance that impacts her ability to safely and independently complete functional tasks.  Prior to admission, patient was independent in ADLs without use of AD.  She lives with her husband who provides assistance for all IADLs including household maintenance, medication management, transportation, and finances. Pt was pleasant and agreeable to therapy this date, though declined OOB mobility.  OT provided education re: OT role and plan of care, as well as importance of OOB mobility to support strength and independence in ADLs.  She is currently able to perform grooming tasks at bedlevel with setup assist, but was unable to assess further mobility or performance in ADLs this date.  Veronica Ellis will continue to benefit from skilled OT services in acute setting to support functional strengthening, activity tolerance, and safety and independence in ADLs.  Recommend short term rehab upon discharge at this time, though anticipate these recommendations could change pending OOB assessment and improvements in strength/endurance.     Recommendations for follow up therapy are one component of a multi-disciplinary discharge planning process, led by the attending physician.  Recommendations may be updated based on patient status, additional functional criteria and insurance authorization.   Follow Up Recommendations  Skilled  nursing-short term rehab (<3 hours/day)    Assistance Recommended at Discharge Frequent or constant Supervision/Assistance  Patient can return home with the following A little help with walking and/or transfers;A little help with bathing/dressing/bathroom;Assistance with cooking/housework;Direct supervision/assist for medications management;Direct supervision/assist for financial management;Assist for transportation;Help with stairs or ramp for entrance    Functional Status Assessment  Patient has had a recent decline in their functional status and demonstrates the ability to make significant improvements in function in a reasonable and predictable amount of time.  Equipment Recommendations  BSC/3in1;Tub/shower seat    Recommendations for Other Services       Precautions / Restrictions Precautions Precautions: None Restrictions Weight Bearing Restrictions: No      Mobility Bed Mobility Overal bed mobility: Needs Assistance             General bed mobility comments: pt declined participation Patient Response: Cooperative  Transfers Overall transfer level: Needs assistance                 General transfer comment: pt declined participation      Balance Overall balance assessment: Needs assistance                                         ADL either performed or assessed with clinical judgement   ADL Overall ADL's : Needs assistance/impaired                                       General ADL Comments: Pt declined OOB mobility so unable to fully assess.  She  is able to complete grooming tasks at bedlevel with setup assist, anticipate similar level of assist needed for feeding (pt is currently NPO)     Vision Baseline Vision/History: 1 Wears glasses Patient Visual Report: No change from baseline       Perception     Praxis      Pertinent Vitals/Pain Pain Assessment Pain Assessment: No/denies pain     Hand Dominance  Right   Extremity/Trunk Assessment Upper Extremity Assessment Upper Extremity Assessment: Generalized weakness   Lower Extremity Assessment Lower Extremity Assessment: Generalized weakness       Communication Communication Communication: No difficulties   Cognition Arousal/Alertness: Awake/alert Behavior During Therapy: WFL for tasks assessed/performed Overall Cognitive Status: History of cognitive impairments - at baseline                                 General Comments: History of mild cognitive impairment, though A&Ox4     General Comments       Exercises Other Exercises Other Exercises: provided education re: OT role and plan of care, self care, importance of OOB mobility on strength and independence   Shoulder Instructions      Home Living Family/patient expects to be discharged to:: Private residence Living Arrangements: Children Available Help at Discharge: Family Type of Home: House Home Access: Stairs to enter Technical brewer of Steps: 5 Entrance Stairs-Rails: Left Home Layout: Multi-level;Bed/bath upstairs Alternate Level Stairs-Number of Steps: split level with "several" steps to upstairs bedroom   Bathroom Shower/Tub: Occupational psychologist: Standard     Home Equipment: Cane - single point;Cane - quad          Prior Functioning/Environment Prior Level of Function : History of Falls (last six months);Independent/Modified Independent             Mobility Comments: Pt is independent with mobility at baseline, does not use AD ADLs Comments: Independent at baseline, receives assistance from son for IADLs including cooking, cleaning, driving, finances, and medications        OT Problem List: Decreased strength;Decreased activity tolerance;Decreased knowledge of use of DME or AE      OT Treatment/Interventions: Self-care/ADL training;Therapeutic exercise;DME and/or AE instruction;Therapeutic  activities;Patient/family education    OT Goals(Current goals can be found in the care plan section) Acute Rehab OT Goals Patient Stated Goal: return home OT Goal Formulation: With patient Time For Goal Achievement: 02/25/22 Potential to Achieve Goals: Good  OT Frequency: Min 2X/week    Co-evaluation              AM-PAC OT "6 Clicks" Daily Activity     Outcome Measure Help from another person eating meals?: None Help from another person taking care of personal grooming?: None Help from another person toileting, which includes using toliet, bedpan, or urinal?: A Lot Help from another person bathing (including washing, rinsing, drying)?: A Lot Help from another person to put on and taking off regular upper body clothing?: A Little Help from another person to put on and taking off regular lower body clothing?: A Lot 6 Click Score: 17   End of Session Equipment Utilized During Treatment: Oxygen  Activity Tolerance: Patient limited by fatigue Patient left: in bed;with call bell/phone within reach;with bed alarm set  OT Visit Diagnosis: Muscle weakness (generalized) (M62.81)                Time: 5643-3295 OT Time Calculation (min): 19  min Charges:  OT General Charges $OT Visit: 1 Visit OT Evaluation $OT Eval Moderate Complexity: 1 Mod  Arlyce Circle, OTR/L 02/11/22, 10:11 AM

## 2022-02-12 ENCOUNTER — Encounter: Payer: Self-pay | Admitting: Gastroenterology

## 2022-02-12 ENCOUNTER — Inpatient Hospital Stay (HOSPITAL_COMMUNITY)
Admit: 2022-02-12 | Discharge: 2022-02-12 | Disposition: A | Discharge status: Home / Self Care | Payer: Medicare HMO | Attending: Student | Admitting: Student

## 2022-02-12 DIAGNOSIS — D649 Anemia, unspecified: Secondary | ICD-10-CM | POA: Diagnosis not present

## 2022-02-12 DIAGNOSIS — R008 Other abnormalities of heart beat: Secondary | ICD-10-CM

## 2022-02-12 DIAGNOSIS — D62 Acute posthemorrhagic anemia: Secondary | ICD-10-CM | POA: Diagnosis not present

## 2022-02-12 LAB — COMPREHENSIVE METABOLIC PANEL
ALT: 25 U/L (ref 0–44)
AST: 20 U/L (ref 15–41)
Albumin: 2.7 g/dL — ABNORMAL LOW (ref 3.5–5.0)
Alkaline Phosphatase: 61 U/L (ref 38–126)
Anion gap: 4 — ABNORMAL LOW (ref 5–15)
BUN: 11 mg/dL (ref 8–23)
CO2: 21 mmol/L — ABNORMAL LOW (ref 22–32)
Calcium: 7.8 mg/dL — ABNORMAL LOW (ref 8.9–10.3)
Chloride: 114 mmol/L — ABNORMAL HIGH (ref 98–111)
Creatinine, Ser: 1.06 mg/dL — ABNORMAL HIGH (ref 0.44–1.00)
GFR, Estimated: 51 mL/min — ABNORMAL LOW (ref >60)
Glucose, Bld: 102 mg/dL — ABNORMAL HIGH (ref 70–99)
Potassium: 3.3 mmol/L — ABNORMAL LOW (ref 3.5–5.1)
Sodium: 139 mmol/L (ref 135–145)
Total Bilirubin: 0.8 mg/dL (ref 0.3–1.2)
Total Protein: 5.2 g/dL — ABNORMAL LOW (ref 6.5–8.1)

## 2022-02-12 LAB — URINALYSIS, ROUTINE W REFLEX MICROSCOPIC
Bacteria, UA: NONE SEEN
Bilirubin Urine: NEGATIVE
Glucose, UA: NEGATIVE mg/dL
Ketones, ur: NEGATIVE mg/dL
Nitrite: NEGATIVE
Protein, ur: NEGATIVE mg/dL
Specific Gravity, Urine: 1.009 (ref 1.005–1.030)
pH: 5 (ref 5.0–8.0)

## 2022-02-12 LAB — ECHOCARDIOGRAM LIMITED
Height: 66 in
S' Lateral: 2.9 cm
Weight: 2643.2 oz

## 2022-02-12 LAB — CBC
HCT: 26.2 % — ABNORMAL LOW (ref 36.0–46.0)
Hemoglobin: 8.6 g/dL — ABNORMAL LOW (ref 12.0–15.0)
MCH: 30.2 pg (ref 26.0–34.0)
MCHC: 32.8 g/dL (ref 30.0–36.0)
MCV: 91.9 fL (ref 80.0–100.0)
Platelets: 242 10*3/uL (ref 150–400)
RBC: 2.85 MIL/uL — ABNORMAL LOW (ref 3.87–5.11)
RDW: 17.8 % — ABNORMAL HIGH (ref 11.5–15.5)
WBC: 7.5 10*3/uL (ref 4.0–10.5)
nRBC: 0 % (ref 0.0–0.2)

## 2022-02-12 LAB — PROTIME-INR
INR: 1.5 — ABNORMAL HIGH (ref 0.8–1.2)
Prothrombin Time: 18.3 seconds — ABNORMAL HIGH (ref 11.4–15.2)

## 2022-02-12 LAB — URINE CULTURE: Culture: NO GROWTH

## 2022-02-12 LAB — TROPONIN I (HIGH SENSITIVITY): Troponin I (High Sensitivity): 28 ng/L — ABNORMAL HIGH (ref <18)

## 2022-02-12 LAB — MAGNESIUM: Magnesium: 1.9 mg/dL (ref 1.7–2.4)

## 2022-02-12 MED ORDER — MORPHINE SULFATE (PF) 2 MG/ML IV SOLN
2.0000 mg | Freq: Once | INTRAVENOUS | Status: AC
  Administered 2022-02-12: 2 mg via INTRAVENOUS
  Filled 2022-02-12: qty 1

## 2022-02-12 MED ORDER — VITAMIN B-12 1000 MCG PO TABS
1000.0000 ug | ORAL_TABLET | Freq: Every day | ORAL | Status: DC
  Administered 2022-02-12 – 2022-02-13 (×2): 1000 ug via ORAL
  Filled 2022-02-12 (×2): qty 1

## 2022-02-12 MED ORDER — SODIUM CHLORIDE 0.9 % IV SOLN
300.0000 mg | Freq: Once | INTRAVENOUS | Status: AC
  Administered 2022-02-12: 300 mg via INTRAVENOUS
  Filled 2022-02-12: qty 300

## 2022-02-12 MED ORDER — NITROGLYCERIN 0.4 MG SL SUBL
0.4000 mg | SUBLINGUAL_TABLET | SUBLINGUAL | Status: DC | PRN
  Administered 2022-02-12 (×2): 0.4 mg via SUBLINGUAL
  Filled 2022-02-12 (×2): qty 1

## 2022-02-12 MED ORDER — METOPROLOL SUCCINATE ER 25 MG PO TB24
12.5000 mg | ORAL_TABLET | Freq: Every day | ORAL | Status: DC
  Administered 2022-02-13: 12.5 mg via ORAL
  Filled 2022-02-12: qty 1

## 2022-02-12 MED ORDER — PANTOPRAZOLE SODIUM 40 MG PO TBEC
40.0000 mg | DELAYED_RELEASE_TABLET | Freq: Every day | ORAL | Status: DC
  Administered 2022-02-12 – 2022-02-13 (×2): 40 mg via ORAL
  Filled 2022-02-12 (×2): qty 1

## 2022-02-12 MED ORDER — CEFDINIR 300 MG PO CAPS: 300.0000 mg | ORAL_CAPSULE | Freq: Two times a day (BID) | ORAL | 0 refills | Status: DC

## 2022-02-12 NOTE — TOC Progression Note (Addendum)
Transition of Care [facility]) - Progression Note    Patient Details  Name: Alliyah Roesler MRN: [id number] Date of Birth: [birth year]  Transition of Care [facility]) CM/SW [place], LCSW Phone Number: 02/12/2022, 2:42 PM  Clinical Narrative: Patient does not feel strong enough to return home and now prefers SNF. Reviewed CMS scores with patient and son at bedside. First preference is [facility]. They will review. PASARR under manual review.    3:05 pm: Uploaded clinicals into [place] Must for PASARR review.  3:44 pm: PASARR obtained: [id number]. Expires 7/5.  Expected Discharge Plan: [place] Barriers to Discharge: Barriers Resolved  Expected Discharge Plan and Services Expected Discharge Plan: [place] arrangements for the past 2 months: Single Family Home Expected Discharge Date: 02/12/22               DME Arranged: Gilford Rile rolling DME Agency: [employer] Date DME Agency Contacted: 02/11/22   Representative spoke with at DME Agency: [place]: PT, OT, RN, Nurse's Aide [place] Agency: [facility] Date [facility] Agency Contacted: 02/11/22   Representative spoke with at [place]: [place] ([place]) Interventions    Readmission Risk Interventions    02/11/2022    3:23 PM  Readmission Risk Prevention Plan  Transportation Screening Complete  PCP or Specialist Appt within 3-5 Days Complete  HRI or [place] Complete  Social Work Consult for [place] Planning/Counseling Complete  Palliative Care Screening Not Applicable  Medication Review Press photographer) Complete

## 2022-02-12 NOTE — TOC CM/SW Note (Signed)
RE: Veronica Ellis Date of Birth: 1933/12/06 Date: 02/12/2022   To Whom It May Concern:  Please be advised that the above-named patient will require a short-term nursing home stay - anticipated 30 days or less for rehabilitation and strengthening.  The plan is for return home.

## 2022-02-12 NOTE — NC FL2 (Signed)
[place] LEVEL OF CARE SCREENING TOOL     IDENTIFICATION  Patient Name: Veronica Ellis Birthdate: March 08, 1934 Sex: female Admission Date (Current Location): 02/08/2022  [facility] and [place] Number:  Engineering geologist and Address:  [facility], [address], [place], [place] [address]      Provider Number: [id number]  Attending Physician Name and Address:  Gwynne Edinger, MD  Relative Name and Phone Number:       Current Level of Care: Hospital Recommended Level of Care: [place] Prior Approval Number:    Date Approved/Denied:   PASRR Number: Manual review  Discharge Plan: SNF    Current Diagnoses: Patient Active Problem List   Diagnosis Date Noted   Symptomatic ABLA due to GIB in the setting of supratherapeutic INR 02/09/2022   Supratherapeutic INR 02/09/2022   Goals of care, counseling/discussion 02/09/2022   Paroxysmal A-fib ([place]) 02/09/2022   Urinary tract infection 02/09/2022   Generalized weakness 02/09/2022   GI bleeding 02/08/2022   Coagulopathy/supratherapeutic INR on warfarin 02/08/2022   CKD-3A 02/08/2022   Dyslipidemia 02/08/2022   Depression 02/08/2022   Hypotension 02/08/2022   GERD without esophagitis 08/31/2019   History of IBS 09/26/2018   Long term (current) use of anticoagulants 07/17/2018   3-vessel CAD 07/08/2018   Acute on chronic combined systolic and diastolic CHF (congestive heart failure) ([place]) 07/08/2018   Ischemic cardiomyopathy 07/08/2018   AKI (acute kidney injury) ([place]) 06/06/2018   Hyperkalemia 06/05/2018   Atrial fibrillation with RVR ([place]) 05/21/2018   Symptomatic anemia 05/21/2018   NSTEMI (non-ST elevated myocardial infarction) ([place]) 23/53/6144   Chronic systolic CHF (congestive heart failure) ([place]) 07/30/2017   LBBB (left bundle branch block) 07/10/2017   Lumbar radiculopathy 05/16/2017   Hx of low back pain 05/14/2017   Ataxia 12/31/2016   Memory loss  12/31/2016   Recurrent major depressive disorder, in partial remission ([place]) 12/31/2016   Bipolar 1 disorder, mixed, moderate ([place]) 12/25/2016   Chronic bilateral low back pain with bilateral sciatica 10/18/2016   Bilateral breast cancer ([place]) 02/20/2016   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   Cerebrovascular disease 02/16/2015   Anxiety and depression 02/16/2015   Breast lump 02/16/2015   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[place]) 02/16/2015   Tobacco abuse, in remission 02/16/2015   Routine general medical examination at a health care facility 02/16/2015   Recurrent major depressive episodes ([place]) 02/16/2015   Below normal amount of sodium in the blood 02/16/2015   Pre-operative cardiovascular examination 02/16/2015    Orientation RESPIRATION BLADDER Height & Weight     Self, Time, Situation, Place  Normal Continent Weight: 165 lb 3.2 oz (74.9 kg) Height:  '5\' 6"'$  (167.6 cm)  BEHAVIORAL SYMPTOMS/MOOD NEUROLOGICAL BOWEL NUTRITION STATUS   (None)  (None) Incontinent Diet (Heart healthy)  AMBULATORY STATUS COMMUNICATION OF NEEDS Skin   Limited Assist Verbally Normal                       Personal Care Assistance Level of Assistance  Bathing, Feeding, Dressing Bathing Assistance: Limited assistance Feeding assistance: Limited assistance Dressing Assistance: Limited assistance     Functional Limitations Info  Sight, Hearing, Speech Sight Info: Adequate Hearing Info: Impaired (Hard of hearing) Speech Info: Adequate    SPECIAL CARE FACTORS FREQUENCY  PT (By licensed PT), OT (By licensed OT)     PT Frequency: 5 x week OT Frequency: 5 x week  Contractures Contractures Info: Not present    Additional Factors Info  Code Status, Allergies Code Status Info: DNR Allergies Info: Iodine, Erythromycin, Other, Penicillins, Shellfish Allergy           Current Medications (02/12/2022):  This is the current hospital active medication list Current  Facility-Administered Medications  Medication Dose Route Frequency Provider Last Rate Last Admin   0.9 %  sodium chloride infusion   Intravenous Continuous Jonathon Bellows, MD       acetaminophen (TYLENOL) tablet 650 mg  650 mg Oral Q6H PRN Mansy, Jan A, MD   650 mg at 02/12/22 1042   Or   acetaminophen (TYLENOL) suppository 650 mg  650 mg Rectal Q6H PRN Mansy, Jan A, MD       atorvastatin (LIPITOR) tablet 80 mg  80 mg Oral Daily Mansy, Jan A, MD   80 mg at 02/12/22 1042   buPROPion [facility]) tablet 100 mg  100 mg Oral BID Mansy, Jan A, MD   100 mg at 02/12/22 1043   cefTRIAXone (ROCEPHIN) 1 g in sodium chloride 0.9 % 100 mL IVPB  1 g Intravenous Q24H Mercy Riding, MD   Stopped at 02/11/22 1900   diphenhydrAMINE (BENADRYL) injection 25 mg  25 mg Intravenous Once Zierle-Ghosh, Asia B, DO       ezetimibe (ZETIA) tablet 10 mg  10 mg Oral Daily Mansy, Jan A, MD   10 mg at 02/11/22 1200   iron sucrose (VENOFER) 300 mg in sodium chloride 0.9 % 250 mL IVPB  300 mg Intravenous Once Lin Landsman, MD 176.7 mL/hr at 02/12/22 1429 300 mg at 02/12/22 1429   metoCLOPramide (REGLAN) injection 10 mg  10 mg Intravenous Once Zierle-Ghosh, Asia B, DO       nitroGLYCERIN (NITROSTAT) SL tablet 0.4 mg  0.4 mg Sublingual Q5 min PRN Zierle-Ghosh, Asia B, DO   0.4 mg at 02/12/22 0054   ondansetron (ZOFRAN) tablet 4 mg  4 mg Oral Q6H PRN Mansy, Jan A, MD       Or   ondansetron [facility]) injection 4 mg  4 mg Intravenous Q6H PRN Mansy, Jan A, MD   4 mg at 02/11/22 0014   pantoprazole (PROTONIX) injection 40 mg  40 mg Intravenous Q12H Mansy, Jan A, MD   40 mg at 02/12/22 0746   SUMAtriptan (IMITREX) injection 6 mg  6 mg Subcutaneous Once Zierle-Ghosh, Asia B, DO       traZODone (DESYREL) tablet 25 mg  25 mg Oral QHS PRN Mansy, Jan A, MD   25 mg at 02/11/22 2123   vitamin B-12 (CYANOCOBALAMIN) tablet 1,000 mcg  1,000 mcg Oral Daily Lin Landsman, MD   1,000 mcg at 02/12/22 1248     Discharge  Medications: Please see discharge summary for a list of discharge medications.  Relevant Imaging Results:  Relevant Lab Results:   Additional Information SS#: [id number]  Candie Chroman, LCSW

## 2022-02-12 NOTE — Discharge Summary (Signed)
Veronica Ellis HCW:[id number] DOB: 1933-10-31 DOA: 02/08/2022  PCP: Juline Patch, MD  Admit date: 02/08/2022 Discharge date: 02/13/2022  Time spent: 35 minutes  Recommendations for Outpatient Follow-up:  Close cardiology follow-up CBC at f/u     Discharge Diagnoses:  Principal Problem:   Symptomatic ABLA due to GIB in the setting of supratherapeutic INR Active Problems:   Coagulopathy/supratherapeutic INR on warfarin   Hypotension   Goals of care, counseling/discussion   Chronic systolic CHF (congestive heart failure) (HCC)   CKD-3A   Paroxysmal A-fib (HCC)   Urinary tract infection   Cerebrovascular disease   Anxiety and depression   3-vessel CAD   Symptomatic anemia   GI bleeding   Dyslipidemia   Depression   Supratherapeutic INR   Generalized weakness   Discharge Condition: stable  Diet recommendation: heart healthy  Filed Weights   02/08/22 1651 02/08/22 2039  Weight: 74.4 kg 74.9 kg    History of present illness:  From admission h and p Veronica Ellis is a 88 y.o. Caucasian female with medical history significant for anxiety, depression, hypertension, dyslipidemia, systolic and diastolic CHF, stage IIIa CKD, osteoporosis and atrial fibrillation on anticoagulation with Coumadin, presented to the emergency room with acute onset of dizziness and lightheadedness with a feeling of presyncope.  She admitted to nausea without vomiting or diarrhea.  She does have melanotic stools per ED physician.  She stated that she has been constipated with associated lower abdominal pain.  She felt cold chills but did not check her temperature.  She has been having occasional chest heaviness without palpitations.  No cough or wheezing or hemoptysis.  No dysuria, oliguria or hematuria or flank pain..  No other bleeding diathesis.  Hospital Course:  Patient presents with melena in setting of INR > 10. Hgb 5.9 from 13.3 in April of this year. Transfused a total of 3 units. GI  consulted, performed EGD, colonoscopy, and capsule study - none identified source of bleed. Hemodynamically stable, melena appears resolved. Post-transfusion hgb stable in the 8s. INR improved to 1.5. Cardiology ([place]) advised holding coumadin at discharge. PT/OT advise HH so that has been ordered along with a rolling walker. Discharged home holding coumadin, advising close f/u with cardiology. A DOAC would likely be a better choice for chronic anticoagulation moving forward. Of note patient carries a history of hfpef; TTE obtained this hospitalization shows normal EF.  Procedures: Colonoscopy, upper endoscopy, capsule endoscopy   Consultations: GI  Discharge Exam: Vitals:   02/12/22 2325 02/13/22 0759  BP: (!) 138/51 (!) 125/51  Pulse: 98 89  Resp: 20 18  Temp: 99.4 F (37.4 C) 99.7 F (37.6 C)  SpO2: 91% 93%    General: NAD Cardiovascular: RRR Respiratory: CTAB Abdomen: soft, non-tender  Discharge Instructions   Discharge Instructions     Diet - low sodium heart healthy   Complete by: As directed    Diet - low sodium heart healthy   Complete by: As directed    Increase activity slowly   Complete by: As directed    Increase activity slowly   Complete by: As directed       Allergies as of 02/13/2022       Reactions   Iodine Nausea Only   Betadine okay   Erythromycin Rash   Other Rash   Penicillins Rash   Has patient had a PCN reaction causing immediate rash, facial/tongue/throat swelling, SOB or lightheadedness with hypotension: No Has patient had a PCN reaction causing severe rash  involving mucus membranes or skin necrosis: No Has patient had a PCN reaction that required hospitalization: No Has patient had a PCN reaction occurring within the last 10 years: No If all of the above answers are "NO", then may proceed with Cephalosporin use.   Shellfish Allergy Rash        Medication List     STOP taking these medications    Entresto 97-103 MG Generic  drug: sacubitril-valsartan   gabapentin 300 MG capsule Commonly known as: NEURONTIN   rosuvastatin 40 MG tablet Commonly known as: CRESTOR   warfarin 3 MG tablet Commonly known as: COUMADIN       TAKE these medications    atorvastatin 80 MG tablet Commonly known as: LIPITOR Take 80 mg by mouth at bedtime.   buPROPion 100 MG tablet Commonly known as: WELLBUTRIN Take 1 tablet (100 mg total) by mouth 2 (two) times daily.   ezetimibe 10 MG tablet Commonly known as: ZETIA Take by mouth. Take 1 tablet (10 mg total) by mouth once daily   magnesium oxide 400 MG tablet Commonly known as: MAG-OX Take 1 tablet by mouth daily.   metoprolol succinate 25 MG 24 hr tablet Commonly known as: TOPROL-XL Take 0.5 tablets (12.5 mg total) by mouth daily. What changed: how much to take   nitroGLYCERIN 0.4 MG SL tablet Commonly known as: NITROSTAT Place 0.4 mg under the tongue every 5 (five) minutes as needed for chest pain.   omeprazole 40 MG capsule Commonly known as: PRILOSEC Take 1 capsule (40 mg total) by mouth 2 (two) times daily.   spironolactone 25 MG tablet Commonly known as: ALDACTONE Take 25 mg by mouth at bedtime.   torsemide 10 MG tablet Commonly known as: DEMADEX Take by mouth. Take 1 tablet (10 mg total) by mouth once daily as needed (take if weight increases by 2lbs in 1 day or 5lbs in 1 week from 150lbs.)               Durable Medical Equipment  (From admission, onward)           Start     Ordered   02/13/22 1509  DME Walker  Once       Question Answer Comment  Walker: With [place] Wheels   Patient needs a walker to treat with the following condition Heart failure ([place])      02/13/22 1508   02/11/22 1638  For home use only DME Shower stool  Once       Comments: Generalized weakness   02/11/22 1637   02/11/22 1637  For home use only DME Walker rolling  Once       Question Answer Comment  Walker: With [place] Wheels   Patient needs a walker to  treat with the following condition Generalized weakness      02/11/22 1636   02/11/22 1637  For home use only DME Bedside commode  Once       Question:  Patient needs a bedside commode to treat with the following condition  Answer:  Generalized weakness   02/11/22 1636           Allergies  Allergen Reactions   Iodine Nausea Only    Betadine okay    Erythromycin Rash   Other Rash   Penicillins Rash    Has patient had a PCN reaction causing immediate rash, facial/tongue/throat swelling, SOB or lightheadedness with hypotension: No Has patient had a PCN reaction causing severe rash involving mucus  membranes or skin necrosis: No Has patient had a PCN reaction that required hospitalization: No Has patient had a PCN reaction occurring within the last 10 years: No If all of the above answers are "NO", then may proceed with Cephalosporin use.    Shellfish Allergy Rash    Contact information for follow-up providers     Juline Patch, MD Follow up.   Specialty: Family Medicine Contact information: [address] [place] [address] [phone]         Blazing, Venia Carbon, MD Follow up.   Specialty: Internal Medicine Contact information: [facility] [address] [place] [address] [phone]         Your cardiologist Follow up.   Why: 1-2 weeks             Contact information for after-discharge care     [facility] Preferred SNF .   Service: Skilled Nursing Contact information: [place] [address] [place] [phone]                      The results of significant diagnostics from this hospitalization (including imaging, microbiology, ancillary and laboratory) are listed below for reference.    Significant Diagnostic Studies: DG Chest 2 View  Result Date: 02/08/2022 CLINICAL DATA:  Dizziness,  constipation common black stools. EXAM: CHEST - 2 VIEW COMPARISON:  Chest radiograph August 21, 2018 FINDINGS: The heart size and mediastinal contours are within normal limits. Aortic atherosclerosis. No focal airspace consolidation. No pleural effusion. No pneumothorax. No acute osseous abnormality. IMPRESSION: 1. No acute cardiopulmonary process. 2.  Aortic Atherosclerosis (ICD10-I70.0). Electronically Signed   By: Dahlia Bailiff M.D.   On: 02/08/2022 17:53   ECHOCARDIOGRAM LIMITED  Result Date: 02/12/2022    ECHOCARDIOGRAM LIMITED REPORT   Patient Name:   Veronica Ellis Date of Exam: 02/12/2022 Medical Rec #:  [id number]           Height:       66.0 in Accession #:    [id number]          Weight:       165.2 lb Date of Birth:  01-29-1934            BSA:          1.844 m Patient Age:    28 years            BP:           102/50 mmHg Patient Gender: F                   HR:           88 bpm. Exam Location:  [facility] Procedure: Limited Echo, Color Doppler and Cardiac Doppler Indications:     Other Abnormalities of the heart 00.8  History:         Patient has prior history of Echocardiogram examinations, most                  recent 05/19/2018. Risk Factors:Hypertension and Dyslipidemia.  Sonographer:     Sherrie Sport Referring Phys:  [id number] Charlesetta Ivory GONFA Diagnosing Phys: Kathlyn Sacramento MD  Sonographer Comments: Suboptimal apical window and suboptimal subcostal window. IMPRESSIONS  1. Left ventricular ejection fraction, by estimation, is 55 to 60%. The left ventricle has normal function. The left ventricle has no  regional wall motion abnormalities. There is moderate left ventricular hypertrophy. Left ventricular diastolic function  could not be evaluated.  2. Right ventricular systolic function is normal. The right ventricular size is normal. There is mildly elevated pulmonary artery systolic pressure.  3. The mitral valve is normal in structure. Trivial mitral valve regurgitation. No evidence of mitral stenosis. Moderate  mitral annular calcification.  4. The aortic valve is calcified. Aortic valve regurgitation is not visualized. Aortic valve sclerosis/calcification is present, without any evidence of aortic stenosis.  5. The inferior vena cava is normal in size with greater than 50% respiratory variability, suggesting right atrial pressure of 3 mmHg. FINDINGS  Left Ventricle: Left ventricular ejection fraction, by estimation, is 55 to 60%. The left ventricle has normal function. The left ventricle has no regional wall motion abnormalities. The left ventricular internal cavity size was normal in size. There is  moderate left ventricular hypertrophy. Left ventricular diastolic function could not be evaluated. Right Ventricle: The right ventricular size is normal. No increase in right ventricular wall thickness. Right ventricular systolic function is normal. There is mildly elevated pulmonary artery systolic pressure. The tricuspid regurgitant velocity is 3.08  m/s, and with an assumed right atrial pressure of 3 mmHg, the estimated right ventricular systolic pressure is 32.4 mmHg. Left Atrium: Left atrial size was normal in size. Right Atrium: Right atrial size was normal in size. Pericardium: There is no evidence of pericardial effusion. Mitral Valve: The mitral valve is normal in structure. Moderate mitral annular calcification. Trivial mitral valve regurgitation. No evidence of mitral valve stenosis. Tricuspid Valve: The tricuspid valve is normal in structure. Tricuspid valve regurgitation is mild . No evidence of tricuspid stenosis. Aortic Valve: The aortic valve is calcified. Aortic valve regurgitation is not visualized. Aortic valve sclerosis/calcification is present, without any evidence of aortic stenosis. Pulmonic Valve: The pulmonic valve was normal in structure. Pulmonic valve regurgitation is mild. No evidence of pulmonic stenosis. Aorta: The aortic root is normal in size and structure. Venous: The inferior vena cava is  normal in size with greater than 50% respiratory variability, suggesting right atrial pressure of 3 mmHg. IAS/Shunts: No atrial level shunt detected by color flow Doppler. Additional Comments: There is a small pleural effusion in the left lateral region. LEFT VENTRICLE PLAX 2D LVIDd:         4.10 cm LVIDs:         2.90 cm LV PW:         1.40 cm LV IVS:        1.10 cm LVOT diam:     2.00 cm LVOT Area:     3.14 cm  RIGHT VENTRICLE RV Basal diam:  2.80 cm RV S prime:     13.20 cm/s TAPSE (M-mode): 2.1 cm LEFT ATRIUM         Index LA diam:    3.60 cm 1.95 cm/m                        PULMONIC VALVE AORTA                 PV Vmax:          0.83 m/s Ao Root diam: 3.20 cm PV Vmean:         52.500 cm/s                       PV VTI:  0.152 m                       PV Peak grad:     2.8 mmHg                       PV Mean grad:     1.0 mmHg                       PR End Diast Vel: 4.49 msec                       RVOT Peak grad:   4 mmHg  TRICUSPID VALVE TR Peak grad:   37.9 mmHg TR Vmax:        308.00 cm/s  SHUNTS Systemic Diam: 2.00 cm Pulmonic VTI:  0.180 m Kathlyn Sacramento MD Electronically signed by Kathlyn Sacramento MD Signature Date/Time: 02/12/2022/9:03:54 AM    Final     Microbiology: Recent Results (from the past 240 hour(s))  Urine Culture     Status: None   Collection Time: 02/10/22  4:14 PM   Specimen: Urine, Random  Result Value Ref Range Status   Specimen Description   Final    URINE, RANDOM Performed at [facility], [address] [place], [place] [address]    Special Requests   Final    NONE Performed at [facility], [address], [place], [place] [address]    Culture   Final    NO GROWTH Performed at [facility], [place] [address], [place],  [address]    Report Status 02/12/2022 FINAL  Final     Labs: Basic Metabolic Panel: Recent Labs  Lab 02/09/22 7322 02/10/22 1113 02/11/22 0438 02/12/22 0040 02/12/22 0659 02/13/22 0457  NA 140 140 141 139   --  140  K 3.7 3.5 3.7 3.3*  --  3.4*  CL 114* 114* 116* 114*  --  115*  CO2 22 23 21* 21*  --  22  GLUCOSE 106* 91 94 102*  --  112*  BUN 42* '21 15 11  '$ --  10  CREATININE 1.20* 1.06* 1.09* 1.06*  --  1.10*  CALCIUM 7.8* 7.3* 7.1* 7.8*  --  7.9*  MG  --  1.9 1.8  --  1.9  --   PHOS  --  3.4 3.3  --   --   --    Liver Function Tests: Recent Labs  Lab 02/08/22 1735 02/10/22 1113 02/11/22 0438 02/12/22 0040  AST 22  --   --  20  ALT 21  --   --  25  ALKPHOS 42  --   --  61  BILITOT 0.6  --   --  0.8  PROT 5.3*  --   --  5.2*  ALBUMIN 2.8* 2.5* 2.3* 2.7*   No results for input(s): LIPASE, AMYLASE in the last 168 hours. No results for input(s): AMMONIA in the last 168 hours. CBC: Recent Labs  Lab 02/10/22 1113 02/10/22 1517 02/11/22 0438 02/12/22 0659 02/13/22 0457  WBC 6.8 7.6 8.2 7.5 6.7  HGB 7.1* 7.0* 7.9* 8.6* 8.6*  HCT 22.2* 21.8* 24.1* 26.2* 25.8*  MCV 95.3 94.8 92.0 91.9 90.5  PLT 198 206 195 242 250   Cardiac Enzymes: No results for input(s): CKTOTAL, CKMB, CKMBINDEX, TROPONINI in the last 168 hours. BNP: BNP (last 3 results) No results for input(s): BNP  in the last 8760 hours.  ProBNP (last 3 results) No results for input(s): PROBNP in the last 8760 hours.  CBG: No results for input(s): GLUCAP in the last 168 hours.     Signed:  Desma Maxim MD.  [facility] 02/13/2022, 3:09 PM

## 2022-02-12 NOTE — Care Management Important Message (Signed)
Important Message  Patient Details  Name: Veronica Ellis MRN: [id number] Date of Birth: [birth year]   Medicare Important Message Given:  Yes     Dannette Barbara 02/12/2022, 10:48 AM

## 2022-02-12 NOTE — Progress Notes (Signed)
Patient passed camera at midnight from her capsule study. Will continue to monitor patient

## 2022-02-12 NOTE — Progress Notes (Signed)
Veronica Darby, MD [address]  [place]  [place], [place] [address]  Main: [phone]  Fax: [phone] Pager: [phone]   Subjective: No acute events overnight.  No further episodes of melena.  Patient has urinary retention.  Patient denies having any abdominal pain, tolerating full liquid diet well.  Underwent VCE yesterday   Objective: Vital signs in last 24 hours: Vitals:   02/11/22 1914 02/12/22 0010 02/12/22 0421 02/12/22 0801  BP: 111/60 (!) 132/50 (!) 102/50 (!) 130/47  Pulse: 91 86 88 77  Resp: '16 17 20 16  '$ Temp: 98.6 F (37 C) 98.3 F (36.8 C) 98.7 F (37.1 C) 98.1 F (36.7 C)  TempSrc: Oral  Oral Oral  SpO2: 98% 95% 92% 93%  Weight:      Height:       Weight change:   Intake/Output Summary (Last 24 hours) at 02/12/2022 1147 Last data filed at 02/11/2022 1859 Gross per 24 hour  Intake 120 ml  Output 550 ml  Net -430 ml     Exam: Heart:: Regular rate and rhythm, S1S2 present, or without murmur or extra heart sounds Lungs: normal and clear to auscultation Abdomen: soft, nontender, normal bowel sounds   Lab Results:    Latest Ref Rng & Units 02/12/2022    6:59 AM 02/11/2022    4:38 AM 02/10/2022    3:17 PM  CBC  WBC 4.0 - 10.5 K/uL 7.5   8.2   7.6    Hemoglobin 12.0 - 15.0 g/dL 8.6   7.9   7.0    Hematocrit 36.0 - 46.0 % 26.2   24.1   21.8    Platelets 150 - 400 K/uL 242   195   206        Latest Ref Rng & Units 02/12/2022   12:40 AM 02/11/2022    4:38 AM 02/10/2022   11:13 AM  CMP  Glucose 70 - 99 mg/dL 102   94   91    BUN 8 - 23 mg/dL '11   15   21    '$ Creatinine 0.44 - 1.00 mg/dL 1.06   1.09   1.06    Sodium 135 - 145 mmol/L 139   141   140    Potassium 3.5 - 5.1 mmol/L 3.3   3.7   3.5    Chloride 98 - 111 mmol/L 114   116   114    CO2 22 - 32 mmol/L '21   21   23    '$ Calcium 8.9 - 10.3 mg/dL 7.8   7.1   7.3    Total Protein 6.5 - 8.1 g/dL 5.2      Total Bilirubin 0.3 - 1.2 mg/dL 0.8      Alkaline Phos 38 - 126 U/L 61      AST 15 -  41 U/L 20      ALT 0 - 44 U/L 25        Micro Results: Recent Results (from the past 240 hour(s))  Urine Culture     Status: None   Collection Time: 02/10/22  4:14 PM   Specimen: Urine, Random  Result Value Ref Range Status   Specimen Description   Final    URINE, RANDOM Performed at [facility], [address] [place], [place] [address]    Special Requests   Final    NONE Performed at [facility], [address] [place], [place] [address]    Culture  Final    NO GROWTH Performed at [facility], [place] [address] [place], [place] [address]    Report Status 02/12/2022 FINAL  Final   Studies/Results: ECHOCARDIOGRAM LIMITED  Result Date: 02/12/2022    ECHOCARDIOGRAM LIMITED REPORT   Patient Name:   Veronica Ellis Date of Exam: 02/12/2022 Medical Rec #:  [id number]           Height:       66.0 in Accession #:    [id number]          Weight:       165.2 lb Date of Birth:  1934-01-20            BSA:          1.844 m Patient Age:    88 years            BP:           102/50 mmHg Patient Gender: F                   HR:           88 bpm. Exam Location:  [facility] Procedure: Limited Echo, Color Doppler and Cardiac Doppler Indications:     Other Abnormalities of the heart 00.8  History:         Patient has prior history of Echocardiogram examinations, most                  recent 05/19/2018. Risk Factors:Hypertension and Dyslipidemia.  Sonographer:     Sherrie Sport Referring Phys:  [id number] Charlesetta Ivory GONFA Diagnosing Phys: Kathlyn Sacramento MD  Sonographer Comments: Suboptimal apical window and suboptimal subcostal window. IMPRESSIONS  1. Left ventricular ejection fraction, by estimation, is 55 to 60%. The left ventricle has normal function. The left ventricle has no regional wall motion abnormalities. There is moderate left ventricular hypertrophy. Left ventricular diastolic function  could not be evaluated.  2. Right ventricular systolic function is normal. The right ventricular size is  normal. There is mildly elevated pulmonary artery systolic pressure.  3. The mitral valve is normal in structure. Trivial mitral valve regurgitation. No evidence of mitral stenosis. Moderate mitral annular calcification.  4. The aortic valve is calcified. Aortic valve regurgitation is not visualized. Aortic valve sclerosis/calcification is present, without any evidence of aortic stenosis.  5. The inferior vena cava is normal in size with greater than 50% respiratory variability, suggesting right atrial pressure of 3 mmHg. FINDINGS  Left Ventricle: Left ventricular ejection fraction, by estimation, is 55 to 60%. The left ventricle has normal function. The left ventricle has no regional wall motion abnormalities. The left ventricular internal cavity size was normal in size. There is  moderate left ventricular hypertrophy. Left ventricular diastolic function could not be evaluated. Right Ventricle: The right ventricular size is normal. No increase in right ventricular wall thickness. Right ventricular systolic function is normal. There is mildly elevated pulmonary artery systolic pressure. The tricuspid regurgitant velocity is 3.08  m/s, and with an assumed right atrial pressure of 3 mmHg, the estimated right ventricular systolic pressure is 38.1 mmHg. Left Atrium: Left atrial size was normal in size. Right Atrium: Right atrial size was normal in size. Pericardium: There is no evidence of pericardial effusion. Mitral Valve: The mitral valve is normal in structure. Moderate mitral annular calcification. Trivial mitral valve regurgitation. No evidence of mitral valve stenosis. Tricuspid Valve: The tricuspid valve is normal in structure. Tricuspid valve regurgitation is mild .  No evidence of tricuspid stenosis. Aortic Valve: The aortic valve is calcified. Aortic valve regurgitation is not visualized. Aortic valve sclerosis/calcification is present, without any evidence of aortic stenosis. Pulmonic Valve: The pulmonic  valve was normal in structure. Pulmonic valve regurgitation is mild. No evidence of pulmonic stenosis. Aorta: The aortic root is normal in size and structure. Venous: The inferior vena cava is normal in size with greater than 50% respiratory variability, suggesting right atrial pressure of 3 mmHg. IAS/Shunts: No atrial level shunt detected by color flow Doppler. Additional Comments: There is a small pleural effusion in the left lateral region. LEFT VENTRICLE PLAX 2D LVIDd:         4.10 cm LVIDs:         2.90 cm LV PW:         1.40 cm LV IVS:        1.10 cm LVOT diam:     2.00 cm LVOT Area:     3.14 cm  RIGHT VENTRICLE RV Basal diam:  2.80 cm RV S prime:     13.20 cm/s TAPSE (M-mode): 2.1 cm LEFT ATRIUM         Index LA diam:    3.60 cm 1.95 cm/m                        PULMONIC VALVE AORTA                 PV Vmax:          0.83 m/s Ao Root diam: 3.20 cm PV Vmean:         52.500 cm/s                       PV VTI:           0.152 m                       PV Peak grad:     2.8 mmHg                       PV Mean grad:     1.0 mmHg                       PR End Diast Vel: 4.49 msec                       RVOT Peak grad:   4 mmHg  TRICUSPID VALVE TR Peak grad:   37.9 mmHg TR Vmax:        308.00 cm/s  SHUNTS Systemic Diam: 2.00 cm Pulmonic VTI:  0.180 m Kathlyn Sacramento MD Electronically signed by Kathlyn Sacramento MD Signature Date/Time: 02/12/2022/9:03:54 AM    Final    Medications: I have reviewed the patient's current medications. Prior to Admission:  Medications Prior to Admission  Medication Sig Dispense Refill Last Dose   atorvastatin (LIPITOR) 80 MG tablet Take 80 mg by mouth at bedtime.   02/07/2022   buPROPion (WELLBUTRIN) 100 MG tablet Take 1 tablet (100 mg total) by mouth 2 (two) times daily. 180 tablet 1 02/09/2022   ezetimibe (ZETIA) 10 MG tablet Take by mouth. Take 1 tablet (10 mg total) by mouth once daily   02/09/2022   magnesium oxide (MAG-OX) 400 MG tablet Take 1 tablet by mouth daily.   02/08/2022   metoprolol  succinate (TOPROL-XL) 25  MG 24 hr tablet Take 25 mg by mouth daily.   02/09/2022   nitroGLYCERIN (NITROSTAT) 0.4 MG SL tablet Place 0.4 mg under the tongue every 5 (five) minutes as needed for chest pain.   Past Week   omeprazole (PRILOSEC) 40 MG capsule Take 1 capsule (40 mg total) by mouth 2 (two) times daily. 180 capsule 1 02/08/2022   spironolactone (ALDACTONE) 25 MG tablet Take 25 mg by mouth at bedtime.   02/07/2022   torsemide (DEMADEX) 10 MG tablet Take by mouth. Take 1 tablet (10 mg total) by mouth once daily as needed (take if weight increases by 2lbs in 1 day or 5lbs in 1 week from 150lbs.)   Past Month   warfarin (COUMADIN) 3 MG tablet Take 5 mg by mouth daily.   02/08/2022   gabapentin (NEURONTIN) 300 MG capsule Take 1 capsule by mouth 2 (two) times daily. (Patient not taking: Reported on 02/08/2022)   Not Taking   rosuvastatin (CRESTOR) 40 MG tablet Take 40 mg by mouth daily. (Patient not taking: Reported on 02/08/2022)   Not Taking   sacubitril-valsartan (ENTRESTO) 97-103 MG Take 0.5 tablets by mouth 2 (two) times daily. (Patient not taking: Reported on 01/16/2022) 60 tablet 0    Scheduled:  atorvastatin  80 mg Oral Daily   buPROPion  100 mg Oral BID   diphenhydrAMINE  25 mg Intravenous Once   ezetimibe  10 mg Oral Daily   metoCLOPramide (REGLAN) injection  10 mg Intravenous Once   pantoprazole  40 mg Intravenous Q12H   SUMAtriptan  6 mg Subcutaneous Once   vitamin B-12  1,000 mcg Oral Daily   Continuous:  sodium chloride     cefTRIAXone (ROCEPHIN)  IV Stopped (02/11/22 1900)   iron sucrose     UXL:[id number] **OR** acetaminophen, nitroGLYCERIN, ondansetron **OR** ondansetron (ZOFRAN) IV, traZODone Anti-infectives (From admission, onward)    Start     Dose/Rate Route Frequency Ordered Stop   02/10/22 1700  cefTRIAXone (ROCEPHIN) 1 g in sodium chloride 0.9 % 100 mL IVPB        1 g 200 mL/hr over 30 Minutes Intravenous Every 24 hours 02/10/22 1614 02/15/22 1659      Scheduled  Meds:  atorvastatin  80 mg Oral Daily   buPROPion  100 mg Oral BID   diphenhydrAMINE  25 mg Intravenous Once   ezetimibe  10 mg Oral Daily   metoCLOPramide (REGLAN) injection  10 mg Intravenous Once   pantoprazole  40 mg Intravenous Q12H   SUMAtriptan  6 mg Subcutaneous Once   vitamin B-12  1,000 mcg Oral Daily   Continuous Infusions:  sodium chloride     cefTRIAXone (ROCEPHIN)  IV Stopped (02/11/22 1900)   iron sucrose     PRN Meds:.acetaminophen **OR** acetaminophen, nitroGLYCERIN, ondansetron **OR** ondansetron (ZOFRAN) IV, traZODone   Assessment: Principal Problem:   Symptomatic ABLA due to GIB in the setting of supratherapeutic INR Active Problems:   Cerebrovascular disease   Anxiety and depression   3-vessel CAD   Chronic systolic CHF (congestive heart failure) (HCC)   Symptomatic anemia   GI bleeding   Coagulopathy/supratherapeutic INR on warfarin   CKD-3A   Dyslipidemia   Depression   Hypotension   Supratherapeutic INR   Goals of care, counseling/discussion   Paroxysmal A-fib (HCC)   Urinary tract infection   Generalized weakness  88 year old female is admitted with acute anemia in setting of supratherapeutic INR Patient underwent upper endoscopy and colonoscopy which were unremarkable.  Subsequently underwent  video capsule endoscopy which was also unremarkable.  Patient responded appropriately to blood transfusions, hemoglobin has been stable and there is no evidence of active GI bleed during the entire hospitalization VCE 02/11/2022: Study is complete, capsule reached cecum, normal small bowel transit. Normal appearing small bowel mucosa, no evidence of bleeding source in the small bowel  Plan: Administer IV iron today Start B12 supplements daily Resume coumadin Follow up with hematology for further workup of unexplained anemia No further work-up from GI standpoint at this time GI will sign off today, please call us back with questions or concerns   LOS: 4  days   Contina Strain 02/12/2022, 11:47 AM

## 2022-02-12 NOTE — Progress Notes (Signed)
PROGRESS NOTE  Veronica Ellis TWS:[id number] DOB: 16-Apr-1934   PCP: Juline Patch, MD  Patient is from: Home.  Lives with his son.  Uses rolling walker at baseline.  DOA: 02/08/2022 LOS: 4  Chief complaints Chief Complaint  Patient presents with   Dizziness     Brief Narrative / Interim history: 88 year old F with PMH of systolic CHF, A-fib on warfarin, HTN, CKD-3A, HLD, hemorrhoids and mild cognitive impairment presenting with dizziness, chest tightness, melena and constipation, and admitted for symptomatic ABLA from GI bleed in the setting of supratherapeutic INR > 10.  Hgb 5.9 from 13.3 on 12/25/2021.  Transfused 2 units with appropriate response.  Received IV vitamin K for INR reversal.  GI consulted.   EGD and colonoscopy basically normal.  Capsule endoscopy underway.   Patient has dysuria and suprapubic tenderness.  UA and urine culture ordered.  Started on IV ceftriaxone   In regards to anticoagulation, discussed about DOAC with pharmacy and Dr. Saralyn Pilar at [facility] via secure chat on 6/2.  No contraindication but Dr. Saralyn Pilar recommended resuming anticoagulation after outpatient follow-up at cardiology office.   Therapy recommended SNF but patient and family prefers to go home with home health and DME (ordered).      Subjective: Seen and examined earlier this morning.  No major events overnight of this morning.  No bleeding. Tolerated procedures  Objective: Vitals:   02/11/22 1914 02/12/22 0010 02/12/22 0421 02/12/22 0801  BP: 111/60 (!) 132/50 (!) 102/50 (!) 130/47  Pulse: 91 86 88 77  Resp: '16 17 20 16  '$ Temp: 98.6 F (37 C) 98.3 F (36.8 C) 98.7 F (37.1 C) 98.1 F (36.7 C)  TempSrc: Oral  Oral Oral  SpO2: 98% 95% 92% 93%  Weight:      Height:        Examination:  GENERAL: No apparent distress.  Nontoxic. HEENT: MMM.  Vision and hearing grossly intact.  NECK: Supple.  No apparent JVD.  RESP:  No IWOB.  Fair aeration bilaterally. CVS:  RRR. Heart sounds  normal.  ABD/GI/GU: BS+. Abd soft.  Some suprapubic tenderness.  No rebound MSK/EXT:  Moves extremities. No apparent deformity. No edema.  SKIN: no apparent skin lesion or wound NEURO: Awake and alert. Oriented fairly.  No apparent focal neuro deficit. PSYCH: calm  Procedures:  6/2-EGD normal. 6/3-colonoscopy normal. 6/4-capsule endoscopy normal  Microbiology summarized: 6/4-urine culture pending  Assessment and Plan: * Symptomatic ABLA due to GIB in the setting of supratherapeutic INR Recent Labs    02/08/22 1653 02/09/22 0638 02/10/22 1113 02/10/22 1517 02/11/22 0438  HGB 5.9* 7.9* 7.1* 7.0* 7.9*  Hgb 13.3 on 12/25/2021.  INR > 10 on arrival.  Down to 1.4 after IV vitamin K. She reports melena prior to presentation. Denies NSAID.  Transfused 3 units so far.  Hgb improved to 8.6 and stable. No further bleeding here.  Soft blood pressure but not symptomatic. EGD and colonoscopy without significant finding. Normal capsule endoscopy. -Discontinue warfarin on discharge.  Cardiology to consider [place] outpatient. - no further GI w/u inpatient unless bleeding resumes  Goals of care, counseling/discussion DNR/DNI.  See discussion on 6/2  Hypotension resolved  Coagulopathy/supratherapeutic INR on warfarin Patient denies recent change to her warfarin.  INR > 10 on arrival>> 1.4 after IV vitamin K -Discontinue warfarin on discharge.  Cardiology to consider DOAC at next follow-up outpatient.  Dysuria Resolved. Culture no growth - d/c ceftriaxone  Paroxysmal A-fib (HCC) Currently in sinus rhythm.  Rate controlled. -Holding  warfarin.  Discontinue on discharge.  cardiology to consider [place] next follow-up outpatient -resume metop but at lower dose  CKD-3A Recent Labs    02/08/22 1653 02/09/22 0638 02/10/22 1113 02/11/22 0438  BUN 45* 42* 21 15  CREATININE 1.31* 1.20* 1.06* 1.09*  Creatinine better here.  Continue monitoring   Chronic systolic CHF (congestive heart failure)  ([place]) TTE in 2019 with LVEF of 20 to 25%, diffuse hypokinesis without significant valvular disease.  Seems to be on Demadex, Aldactone, Entresto and Toprol-XL.  Appears euvolemic on exam.  CXR and serial troponin negative.  Soft BP.  Net +1.6 L. -Discontinue IV fluid -Continue holding home cardiac meds. -Limited echocardiogram -Monitor I&O, daily weight, renal functions and electrolytes. -Continue Lipitor and Zetia  Generalized weakness Multifactorial.  Therapy recommended SNF but patient and family prefers to go home with home health and DME. Changed mind today so canceled discharge order - TOC consulted for snf  Dyslipidemia - We will continue statin therapy.  3-vessel CAD Stable.  Had mild chest tightness on arrival likely from anemia.  EKG and troponin without acute finding. -Continue statin and Zetia.  Anxiety and depression Stable.  Continue home meds.  Cerebrovascular disease Stable.  Continue statin       DVT prophylaxis:  SCDs Start: 02/08/22 1929  Code Status: DNR/DNI Family Communication: Updated patient's son telephonically Level of care: Med-Surg Status is: Inpatient Remains inpatient appropriate because: unsafe d/c plan  Consultants:  Gastroenterology   Sch Meds:  Scheduled Meds:  atorvastatin  80 mg Oral Daily   buPROPion  100 mg Oral BID   diphenhydrAMINE  25 mg Intravenous Once   ezetimibe  10 mg Oral Daily   metoCLOPramide (REGLAN) injection  10 mg Intravenous Once   [START ON 02/13/2022] metoprolol succinate  12.5 mg Oral Daily   pantoprazole  40 mg Oral Daily   SUMAtriptan  6 mg Subcutaneous Once   vitamin B-12  1,000 mcg Oral Daily   Continuous Infusions:  sodium chloride     iron sucrose 300 mg (02/12/22 1429)   PRN Meds:.acetaminophen **OR** acetaminophen, nitroGLYCERIN, ondansetron **OR** ondansetron (ZOFRAN) IV, traZODone  Antimicrobials: Anti-infectives (From admission, onward)    Start     Dose/Rate Route Frequency Ordered Stop    02/12/22 0000  cefdinir (OMNICEF) 300 MG capsule        300 mg Oral 2 times daily 02/12/22 1241     02/10/22 1700  cefTRIAXone (ROCEPHIN) 1 g in sodium chloride 0.9 % 100 mL IVPB  Status:  Discontinued        1 g 200 mL/hr over 30 Minutes Intravenous Every 24 hours 02/10/22 1614 02/12/22 1545        I have personally reviewed the following labs and images: CBC: Recent Labs  Lab 02/09/22 0638 02/10/22 1113 02/10/22 1517 02/11/22 0438 02/12/22 0659  WBC 11.0* 6.8 7.6 8.2 7.5  HGB 7.9* 7.1* 7.0* 7.9* 8.6*  HCT 23.0* 22.2* 21.8* 24.1* 26.2*  MCV 89.8 95.3 94.8 92.0 91.9  PLT 213 198 206 195 242   BMP &GFR Recent Labs  Lab 02/08/22 1653 02/09/22 0638 02/10/22 1113 02/11/22 0438 02/12/22 0040 02/12/22 0659  NA 138 140 140 141 139  --   K 4.0 3.7 3.5 3.7 3.3*  --   CL 111 114* 114* 116* 114*  --   CO2 '22 22 23 '$ 21* 21*  --   GLUCOSE 149* 106* 91 94 102*  --   BUN 45* 42* '21 15 11  '$ --  CREATININE 1.31* 1.20* 1.06* 1.09* 1.06*  --   CALCIUM 8.1* 7.8* 7.3* 7.1* 7.8*  --   MG  --   --  1.9 1.8  --  1.9  PHOS  --   --  3.4 3.3  --   --    Estimated Creatinine Clearance: 39.4 mL/min (A) (by C-G formula based on SCr of 1.06 mg/dL (H)). Liver & Pancreas: Recent Labs  Lab 02/08/22 1735 02/10/22 1113 02/11/22 0438 02/12/22 0040  AST 22  --   --  20  ALT 21  --   --  25  ALKPHOS 42  --   --  61  BILITOT 0.6  --   --  0.8  PROT 5.3*  --   --  5.2*  ALBUMIN 2.8* 2.5* 2.3* 2.7*   No results for input(s): LIPASE, AMYLASE in the last 168 hours. No results for input(s): AMMONIA in the last 168 hours. Diabetic: No results for input(s): HGBA1C in the last 72 hours. No results for input(s): GLUCAP in the last 168 hours. Cardiac Enzymes: No results for input(s): CKTOTAL, CKMB, CKMBINDEX, TROPONINI in the last 168 hours. No results for input(s): PROBNP in the last 8760 hours. Coagulation Profile: Recent Labs  Lab 02/08/22 1735 02/09/22 7672 02/10/22 0453 02/11/22 0438  02/12/22 0659  INR >10.0* 1.6* 1.4* 1.4* 1.5*   Thyroid Function Tests: Recent Labs    02/11/22 0800  TSH 3.246   Lipid Profile: No results for input(s): CHOL, HDL, LDLCALC, TRIG, CHOLHDL, LDLDIRECT in the last 72 hours. Anemia Panel: Recent Labs    02/10/22 1113  VITAMINB12 209  FOLATE 20.3  FERRITIN 47  TIBC 274  IRON 30   Urine analysis:    Component Value Date/Time   COLORURINE YELLOW (A) 02/12/2022 0047   APPEARANCEUR HAZY (A) 02/12/2022 0047   APPEARANCEUR Hazy 04/29/2014 1810   LABSPEC 1.009 02/12/2022 0047   LABSPEC 1.023 04/29/2014 1810   PHURINE 5.0 02/12/2022 0047   GLUCOSEU NEGATIVE 02/12/2022 0047   GLUCOSEU Negative 04/29/2014 1810   HGBUR SMALL (A) 02/12/2022 0047   BILIRUBINUR NEGATIVE 02/12/2022 0047   BILIRUBINUR Negative 04/29/2014 1810   KETONESUR NEGATIVE 02/12/2022 0047   PROTEINUR NEGATIVE 02/12/2022 0047   NITRITE NEGATIVE 02/12/2022 0047   LEUKOCYTESUR SMALL (A) 02/12/2022 0047   LEUKOCYTESUR Trace 04/29/2014 1810   Sepsis Labs: Invalid input(s): PROCALCITONIN, [place]  Microbiology: Recent Results (from the past 240 hour(s))  Urine Culture     Status: None   Collection Time: 02/10/22  4:14 PM   Specimen: Urine, Random  Result Value Ref Range Status   Specimen Description   Final    URINE, RANDOM Performed at [facility], [address] [place], [place] [address]    Special Requests   Final    NONE Performed at [facility], [address] [place], [place] [address]    Culture   Final    NO GROWTH Performed at [facility], [place] [address], [place], [place] [address]    Report Status 02/12/2022 FINAL  Final    Radiology Studies: ECHOCARDIOGRAM LIMITED  Result Date: 02/12/2022    ECHOCARDIOGRAM LIMITED REPORT   Patient Name:   Veronica Ellis Date of Exam: 02/12/2022 Medical Rec #:  [id number]           Height:       66.0 in Accession #:    [id number]          Weight:  165.2 lb Date of  Birth:  Mar 12, 1934            BSA:          1.844 m Patient Age:    62 years            BP:           102/50 mmHg Patient Gender: F                   HR:           88 bpm. Exam Location:  [facility] Procedure: Limited Echo, Color Doppler and Cardiac Doppler Indications:     Other Abnormalities of the heart 00.8  History:         Patient has prior history of Echocardiogram examinations, most                  recent 05/19/2018. Risk Factors:Hypertension and Dyslipidemia.  Sonographer:     Sherrie Sport Referring Phys:  [id number] Charlesetta Ivory GONFA Diagnosing Phys: Kathlyn Sacramento MD  Sonographer Comments: Suboptimal apical window and suboptimal subcostal window. IMPRESSIONS  1. Left ventricular ejection fraction, by estimation, is 55 to 60%. The left ventricle has normal function. The left ventricle has no regional wall motion abnormalities. There is moderate left ventricular hypertrophy. Left ventricular diastolic function  could not be evaluated.  2. Right ventricular systolic function is normal. The right ventricular size is normal. There is mildly elevated pulmonary artery systolic pressure.  3. The mitral valve is normal in structure. Trivial mitral valve regurgitation. No evidence of mitral stenosis. Moderate mitral annular calcification.  4. The aortic valve is calcified. Aortic valve regurgitation is not visualized. Aortic valve sclerosis/calcification is present, without any evidence of aortic stenosis.  5. The inferior vena cava is normal in size with greater than 50% respiratory variability, suggesting right atrial pressure of 3 mmHg. FINDINGS  Left Ventricle: Left ventricular ejection fraction, by estimation, is 55 to 60%. The left ventricle has normal function. The left ventricle has no regional wall motion abnormalities. The left ventricular internal cavity size was normal in size. There is  moderate left ventricular hypertrophy. Left ventricular diastolic function could not be evaluated. Right Ventricle: The right  ventricular size is normal. No increase in right ventricular wall thickness. Right ventricular systolic function is normal. There is mildly elevated pulmonary artery systolic pressure. The tricuspid regurgitant velocity is 3.08  m/s, and with an assumed right atrial pressure of 3 mmHg, the estimated right ventricular systolic pressure is 02.5 mmHg. Left Atrium: Left atrial size was normal in size. Right Atrium: Right atrial size was normal in size. Pericardium: There is no evidence of pericardial effusion. Mitral Valve: The mitral valve is normal in structure. Moderate mitral annular calcification. Trivial mitral valve regurgitation. No evidence of mitral valve stenosis. Tricuspid Valve: The tricuspid valve is normal in structure. Tricuspid valve regurgitation is mild . No evidence of tricuspid stenosis. Aortic Valve: The aortic valve is calcified. Aortic valve regurgitation is not visualized. Aortic valve sclerosis/calcification is present, without any evidence of aortic stenosis. Pulmonic Valve: The pulmonic valve was normal in structure. Pulmonic valve regurgitation is mild. No evidence of pulmonic stenosis. Aorta: The aortic root is normal in size and structure. Venous: The inferior vena cava is normal in size with greater than 50% respiratory variability, suggesting right atrial pressure of 3 mmHg. IAS/Shunts: No atrial level shunt detected by color flow Doppler. Additional Comments: There is a small pleural effusion in the left  lateral region. LEFT VENTRICLE PLAX 2D LVIDd:         4.10 cm LVIDs:         2.90 cm LV PW:         1.40 cm LV IVS:        1.10 cm LVOT diam:     2.00 cm LVOT Area:     3.14 cm  RIGHT VENTRICLE RV Basal diam:  2.80 cm RV S prime:     13.20 cm/s TAPSE (M-mode): 2.1 cm LEFT ATRIUM         Index LA diam:    3.60 cm 1.95 cm/m                        PULMONIC VALVE AORTA                 PV Vmax:          0.83 m/s Ao Root diam: 3.20 cm PV Vmean:         52.500 cm/s                       PV  VTI:           0.152 m                       PV Peak grad:     2.8 mmHg                       PV Mean grad:     1.0 mmHg                       PR End Diast Vel: 4.49 msec                       RVOT Peak grad:   4 mmHg  TRICUSPID VALVE TR Peak grad:   37.9 mmHg TR Vmax:        308.00 cm/s  SHUNTS Systemic Diam: 2.00 cm Pulmonic VTI:  0.180 m Kathlyn Sacramento MD Electronically signed by Kathlyn Sacramento MD Signature Date/Time: 02/12/2022/9:03:54 AM    Final       Laurey Arrow, MD [facility]  If 7PM-7AM, please contact night-coverage [email or web address] 02/12/2022, 3:52 PM

## 2022-02-12 NOTE — Progress Notes (Signed)
*  PRELIMINARY RESULTS* Echocardiogram 2D Echocardiogram has been performed.  Sherrie Sport 02/12/2022, 8:28 AM

## 2022-02-12 NOTE — Progress Notes (Signed)
Mobility Specialist - Progress Note   02/12/22 1221  Mobility  Activity Ambulated with assistance in hallway;Transferred from bed to chair  Level of Assistance Standby assist, set-up cues, supervision of patient - no hands on  Assistive Device Front wheel walker  Distance Ambulated (ft) 40 ft  Activity Response Tolerated well  $Mobility charge 1 Mobility     During mobility: 106 HR, 93% SpO2 Post-mobility: 92 HR, 92% SpO2   MinA to exit bed. Pt ambulated in hallway with supervision. Participated in stair-training (6 steps) with good technique using L rail, verbal cues only for hand placement. SOB with activity, O2 maintained in 90s. Pt returned to room and transferred to recliner with alarm set, needs in reach.    Kathee Delton Mobility Specialist 02/12/22, 12:38 PM

## 2022-02-13 DIAGNOSIS — D62 Acute posthemorrhagic anemia: Secondary | ICD-10-CM | POA: Diagnosis not present

## 2022-02-13 LAB — BASIC METABOLIC PANEL
Anion gap: 3 — ABNORMAL LOW (ref 5–15)
BUN: 10 mg/dL (ref 8–23)
CO2: 22 mmol/L (ref 22–32)
Calcium: 7.9 mg/dL — ABNORMAL LOW (ref 8.9–10.3)
Chloride: 115 mmol/L — ABNORMAL HIGH (ref 98–111)
Creatinine, Ser: 1.1 mg/dL — ABNORMAL HIGH (ref 0.44–1.00)
GFR, Estimated: 49 mL/min — ABNORMAL LOW (ref >60)
Glucose, Bld: 112 mg/dL — ABNORMAL HIGH (ref 70–99)
Potassium: 3.4 mmol/L — ABNORMAL LOW (ref 3.5–5.1)
Sodium: 140 mmol/L (ref 135–145)

## 2022-02-13 LAB — CBC
HCT: 25.8 % — ABNORMAL LOW (ref 36.0–46.0)
Hemoglobin: 8.6 g/dL — ABNORMAL LOW (ref 12.0–15.0)
MCH: 30.2 pg (ref 26.0–34.0)
MCHC: 33.3 g/dL (ref 30.0–36.0)
MCV: 90.5 fL (ref 80.0–100.0)
Platelets: 250 10*3/uL (ref 150–400)
RBC: 2.85 MIL/uL — ABNORMAL LOW (ref 3.87–5.11)
RDW: 17.4 % — ABNORMAL HIGH (ref 11.5–15.5)
WBC: 6.7 10*3/uL (ref 4.0–10.5)
nRBC: 0 % (ref 0.0–0.2)

## 2022-02-13 LAB — SURGICAL PATHOLOGY

## 2022-02-13 MED ORDER — SPIRONOLACTONE 25 MG PO TABS
25.0000 mg | ORAL_TABLET | Freq: Every day | ORAL | Status: DC
  Administered 2022-02-13: 25 mg via ORAL
  Filled 2022-02-13: qty 1

## 2022-02-13 MED ORDER — POTASSIUM CHLORIDE CRYS ER 20 MEQ PO TBCR: 40.0000 meq | EXTENDED_RELEASE_TABLET | Freq: Once | ORAL | Status: DC

## 2022-02-13 MED ORDER — METOPROLOL SUCCINATE ER 25 MG PO TB24: 12.5000 mg | ORAL_TABLET | Freq: Every day | ORAL | Status: AC

## 2022-02-13 NOTE — Anesthesia Postprocedure Evaluation (Signed)
Anesthesia Post Note  Patient: Veronica Ellis  Procedure(s) Performed: ESOPHAGOGASTRODUODENOSCOPY (EGD) WITH PROPOFOL  Patient location during evaluation: Endoscopy Anesthesia Type: General Level of consciousness: awake and alert Pain management: pain level controlled Vital Signs Assessment: post-procedure vital signs reviewed and stable Respiratory status: spontaneous breathing, nonlabored ventilation and respiratory function stable Cardiovascular status: blood pressure returned to baseline and stable Postop Assessment: no apparent nausea or vomiting Anesthetic complications: no   No notable events documented.   Last Vitals:  Vitals:   02/12/22 2325 02/13/22 0759  BP: (!) 138/51 (!) 125/51  Pulse: 98 89  Resp: 20 18  Temp: 37.4 C 37.6 C  SpO2: 91% 93%    Last Pain:  Vitals:   02/13/22 0850  TempSrc:   PainSc: 0-No pain                 [place] Ouch

## 2022-02-13 NOTE — Progress Notes (Signed)
Physical Therapy Treatment Patient Details Name: Veronica Ellis MRN: [id number] DOB: 26-Oct-1933 Today's Date: 02/13/2022   History of Present Illness Pt is an 88 year old female with PMH including CHF, atrial fibrillation, CKD, depression, and mild cognitive impairment.  She presented to the ED on 02/08/22 with dizziness, chest tightness, melena, and constipation.  Pt admitted for ABLA from GI bleed, s/p blood transfusions and essentially normal colonoscopy on 02/10/22.  Pt scheduled for endoscopy on 02/11/22.    PT Comments    Pt received sitting EoB with RN.  Pt able to stand to RW with minguard with VC's for had placement. Pt performing step through giat with RW at decreased but consistent cadence demonstrating ability to asc/desc stais with sideways approach with step to pattern descending and alternating with ascending with minguard descending and supervision to ascend. Pt requiring standing rest break post stairs then ambulating ~200' with supervision with 1 additional standing rest break during bout with noted increased WOB. Pt recovers quickly with ~30 sec rest in standing with HR and Spo2 WNL throughout. Pt returning to room in recliner with all needs in reach. D/c recs changed to [facility] PT based off of progress in mobility and safe stair completion. Pt's son updated and agreeable. TOC updated.    Recommendations for follow up therapy are one component of a multi-disciplinary discharge planning process, led by the attending physician.  Recommendations may be updated based on patient status, additional functional criteria and insurance authorization.  Follow Up Recommendations  Home health PT     Assistance Recommended at Discharge Frequent or constant Supervision/Assistance  Patient can return home with the following A little help with walking and/or transfers;Assist for transportation;Help with stairs or ramp for entrance   Equipment Recommendations  Rolling walker (2 wheels);BSC/3in1     Recommendations for Other Services       Precautions / Restrictions Precautions Precautions: Fall Restrictions Weight Bearing Restrictions: No     Mobility  Bed Mobility Overal bed mobility: Needs Assistance (Simultaneous filing. User may not have seen previous data.) Bed Mobility: Supine to Sit     Supine to sit: Supervision, HOB elevated       Patient Response: Cooperative  Transfers Overall transfer level: Needs assistance (Simultaneous filing. User may not have seen previous data.) Equipment used: Rolling walker (2 wheels) (Simultaneous filing. User may not have seen previous data.) Transfers: Sit to/from Stand (Simultaneous filing. User may not have seen previous data.) Sit to Stand: Min guard (Simultaneous filing. User may not have seen previous data.)                Ambulation/Gait Ambulation/Gait assistance: Supervision, Min guard Gait Distance (Feet): 200 Feet Assistive device: Rolling walker (2 wheels) Gait Pattern/deviations: Step-through pattern, Trunk flexed       General Gait Details: Pt able to progress gait to 200' with step through pattern with good stability and safe use of RW   Stairs Stairs: Yes Stairs assistance: Min guard Stair Management: One rail Right, Sideways, Step to pattern, Alternating pattern Number of Stairs: 6 General stair comments: asc/desc 6 stairs with minguard. Step to with descending and alternating with ascending.   Wheelchair Mobility    Modified Rankin (Stroke Patients Only)       Balance Overall balance assessment: Needs assistance Sitting-balance support: Feet supported Sitting balance-Leahy Scale: Normal     Standing balance support: Reliant on assistive device for balance, During functional activity Standing balance-Leahy Scale: Good  Cognition Arousal/Alertness: Awake/alert Behavior During Therapy: WFL for tasks assessed/performed Overall Cognitive Status:  History of cognitive impairments - at baseline                                          Exercises General Exercises - Lower Extremity Long Arc Quad: AROM, Strengthening, Both, 10 reps, Seated Hip Flexion/Marching: AROM, Strengthening, Both, 10 reps, Seated Toe Raises: AROM, Strengthening, Both, 10 reps, Seated Heel Raises: AROM, Seated, Strengthening, Both, 10 reps    General Comments        Pertinent Vitals/Pain Pain Assessment Pain Assessment: No/denies pain    Home Living                          Prior Function            PT Goals (current goals can now be found in the care plan section) Acute Rehab PT Goals Patient Stated Goal: " I want to get better." PT Goal Formulation: With patient/family Time For Goal Achievement: 02/25/22 Potential to Achieve Goals: Fair Progress towards PT goals: Progressing toward goals    Frequency    Min 2X/week      PT Plan Discharge plan needs to be updated    Co-evaluation              AM-PAC PT "6 Clicks" Mobility   Outcome Measure  Help needed turning from your back to your side while in a flat bed without using bedrails?: A Little Help needed moving from lying on your back to sitting on the side of a flat bed without using bedrails?: A Little Help needed moving to and from a bed to a chair (including a wheelchair)?: A Little Help needed standing up from a chair using your arms (e.g., wheelchair or bedside chair)?: A Little Help needed to walk in hospital room?: A Little Help needed climbing 3-5 steps with a railing? : A Little 6 Click Score: 18    End of Session Equipment Utilized During Treatment: Gait belt Activity Tolerance: Patient tolerated treatment well Patient left: in chair;with call bell/phone within reach;with chair alarm set Nurse Communication: Mobility status PT Visit Diagnosis: Unsteadiness on feet (R26.81);Muscle weakness (generalized) (M62.81);History of falling  (Z91.81);Difficulty in walking, not elsewhere classified (R26.2)     Time: 4742-5956 PT Time Calculation (min) (ACUTE ONLY): 27 min  Charges:  $Gait Training: 23-37 mins                     Salem Caster. Fairly IV, PT, DPT Physical Therapist- [facility]  02/13/2022, 3:22 PM

## 2022-02-13 NOTE — TOC CM/SW Note (Signed)
Patient is not able to walk the distance required to go the bathroom, or he/she is unable to safely negotiate stairs required to access the bathroom.  A 3in1 BSC will alleviate this problem  

## 2022-02-13 NOTE — TOC Transition Note (Signed)
Transition of Care [facility]) - CM/SW Discharge Note   Patient Details  Name: Veronica Ellis MRN: [id number] Date of Birth: 01-17-1934  [facility] CM/SW Contact:  Candie Chroman, LCSW Phone Number: 02/13/2022, 3:35 PM   Clinical Narrative:  Patient has orders to discharge home today. [employer] representative is aware. Ordered RW and 3-in-1 through [employer] to be delivered to the room before she leaves. No further concerns. CSW signing off.   Final next level of care: [place] Barriers to Discharge: Barriers Resolved   Patient Goals and CMS Choice Patient states their goals for this hospitalization and ongoing recovery are:: Home CMS Medicare.gov Compare Post Acute Care list provided to:: Patient Represenative (must comment) Choice offered to / list presented to : Adult Children  Discharge Placement                  Name of family member notified: Higinio Roger Patient and family notified of of transfer: 02/13/22  Discharge Plan and Services                DME Arranged: Gilford Rile rolling, 3-N-1 DME Agency: [employer] Date DME Agency Contacted: 02/13/22   Representative spoke with at DME Agency: Suanne Marker HH Arranged: PT, OT [facility] Agency: [facility] [place] Date [place]: 02/13/22   Representative spoke with at [place]: [place] ([place]) Interventions     Readmission Risk Interventions    02/11/2022    3:23 PM  Readmission Risk Prevention Plan  Transportation Screening Complete  PCP or Specialist Appt within 3-5 Days Complete  HRI or [place] Complete  Social Work Consult for [place] Planning/Counseling Complete  Palliative Care Screening Not Applicable  Medication Review Press photographer) Complete

## 2022-02-13 NOTE — Progress Notes (Signed)
Occupational Therapy Treatment Patient Details Name: Veronica Ellis MRN: [id number] DOB: 01/11/1934 Today's Date: 02/13/2022   History of present illness Pt is an 88 year old female with PMH including CHF, atrial fibrillation, CKD, depression, and mild cognitive impairment.  She presented to the ED on 02/08/22 with dizziness, chest tightness, melena, and constipation.  Pt admitted for ABLA from GI bleed, s/p blood transfusions and essentially normal colonoscopy on 02/10/22.  Pt scheduled for endoscopy on 02/11/22.   OT comments  Upon entering the room, pt supine in bed with no c/o pain. Pt greets therapist with," I want to go to rehab" and " I can't walk". OT encouraged pt to participate. Pt performing bed mobility without assistance. Pt's son enters the room and pt stands to ambulate to bathroom with RW and supervision. Pt becomes incontinent of BM on floor while ambulating to bathroom. Pt sits on commode and is able to perform hygiene and change socks at supervision level. Pt stands at sink and washes hands. She ambulates back into room towards bed and begins reporting need for BM again and sits on 3 in 1 BSC. Son reports this is normal for her because "she has IBS and she wear multiple diapers at home". Pt remaining on [facility] with son in room and brief placed in room. OT notifying RN. Pt is likely very close to baseline and recommendations changed to home with son and [facility] therapy.    Recommendations for follow up therapy are one component of a multi-disciplinary discharge planning process, led by the attending physician.  Recommendations may be updated based on patient status, additional functional criteria and insurance authorization.    Follow Up Recommendations  Home health OT    Assistance Recommended at Discharge Frequent or constant Supervision/Assistance  Patient can return home with the following  A little help with walking and/or transfers;A little help with bathing/dressing/bathroom;Assistance  with cooking/housework;Direct supervision/assist for medications management;Direct supervision/assist for financial management;Assist for transportation;Help with stairs or ramp for entrance   Equipment Recommendations  BSC/3in1       Precautions / Restrictions Precautions Precautions: Fall Restrictions Weight Bearing Restrictions: No       Mobility Bed Mobility               General bed mobility comments: no physical assistance    Transfers                         Balance Overall balance assessment: Needs assistance Sitting-balance support: Feet supported Sitting balance-Leahy Scale: Normal     Standing balance support: Reliant on assistive device for balance, During functional activity Standing balance-Leahy Scale: Good                             ADL either performed or assessed with clinical judgement   ADL Overall ADL's : At baseline                                       General ADL Comments: Pt is close to baseline and required supervision for safety awareness secondary to cognition for toileting and hygiene needs.    Extremity/Trunk Assessment Upper Extremity Assessment Upper Extremity Assessment: Generalized weakness   Lower Extremity Assessment Lower Extremity Assessment: Generalized weakness        Vision Patient Visual Report: No change from baseline  Cognition Arousal/Alertness: Awake/alert Behavior During Therapy: WFL for tasks assessed/performed Overall Cognitive Status: History of cognitive impairments - at baseline                                                     Pertinent Vitals/ Pain       Pain Assessment Pain Assessment: No/denies pain         Frequency  Min 2X/week        Progress Toward Goals  OT Goals(current goals can now be found in the care plan section)     Acute Rehab OT Goals Patient Stated Goal: to go home OT Goal Formulation:  With patient/family Time For Goal Achievement: 02/25/22 Potential to Achieve Goals: Good  Plan         AM-PAC OT "6 Clicks" Daily Activity     Outcome Measure   Help from another person eating meals?: None Help from another person taking care of personal grooming?: None Help from another person toileting, which includes using toliet, bedpan, or urinal?: None Help from another person bathing (including washing, rinsing, drying)?: None Help from another person to put on and taking off regular upper body clothing?: None Help from another person to put on and taking off regular lower body clothing?: None 6 Click Score: 24    End of Session    OT Visit Diagnosis: Muscle weakness (generalized) (M62.81)   Activity Tolerance Patient tolerated treatment well   Patient Left Other (comment);with family/visitor present (on [facility])   Nurse Communication Mobility status        Time: [id number] OT Time Calculation (min): 26 min  Charges: OT General Charges $OT Visit: 1 Visit OT Treatments $Self Care/Home Management : 23-37 mins  Darleen Crocker, MS, OTR/L , CBIS ascom [phone]  02/13/22, 3:17 PM

## 2022-02-13 NOTE — TOC Progression Note (Addendum)
Transition of Care [facility]) - Progression Note    Patient Details  Name: Veronica Ellis MRN: [id number] Date of Birth: 1934/08/08  Transition of Care [employer]) CM/SW [place], LCSW Phone Number: 02/13/2022, 8:42 AM  Clinical Narrative:  [place] has offered a bed. Son is aware and has accepted. He will call patient to notify. Admissions coordinator will start insurance authorization.  2:22 pm: Per PT, patient did much better today. CSW went to talk to patient and she still feels like she needs SNF due to lethargy after working with therapy. Told her we cannot guarantee insurance will cover SNF if she's doing better. Called and updated son. He will come back later to talk with her and notify CSW if she changes her mind.  Expected Discharge Plan: [place] Barriers to Discharge: Barriers Resolved  Expected Discharge Plan and Services Expected Discharge Plan: [place] arrangements for the past 2 months: Single Family Home Expected Discharge Date: 02/12/22               DME Arranged: Gilford Rile rolling DME Agency: [employer] Date DME Agency Contacted: 02/11/22   Representative spoke with at DME Agency: [place]: PT, OT, RN, Nurse's Aide [place] Agency: [facility] Date [facility] Agency Contacted: 02/11/22   Representative spoke with at [place]: [place] ([place]) Interventions    Readmission Risk Interventions    02/11/2022    3:23 PM  Readmission Risk Prevention Plan  Transportation Screening Complete  PCP or Specialist Appt within 3-5 Days Complete  HRI or [place] Complete  Social Work Consult for [place] Planning/Counseling Complete  Palliative Care Screening Not Applicable  Medication Review Press photographer) Complete

## 2022-02-13 NOTE — Plan of Care (Signed)
IV removed, discharge instructions reviewed with patient and her son and daughter.  IV removed and patient discharged to home with family.

## 2022-02-13 NOTE — Progress Notes (Signed)
PROGRESS NOTE  Veronica Ellis WGN:[id number] DOB: [birth year]   PCP: Juline Patch, MD  Patient is from: Home.  Lives with his son.  Uses rolling walker at baseline.  DOA: 02/08/2022 LOS: 5  Chief complaints Chief Complaint  Patient presents with   Dizziness     Brief Narrative / Interim history: 88 year old F with PMH of systolic CHF, A-fib on warfarin, HTN, CKD-3A, HLD, hemorrhoids and mild cognitive impairment presenting with dizziness, chest tightness, melena and constipation, and admitted for symptomatic ABLA from GI bleed in the setting of supratherapeutic INR > 10.  Hgb 5.9 from 13.3 on 12/25/2021.  Transfused 2 units with appropriate response.  Received IV vitamin K for INR reversal.  GI consulted.   EGD and colonoscopy basically normal.  Capsule endoscopy underway.   Patient has dysuria and suprapubic tenderness.  UA and urine culture ordered.  Started on IV ceftriaxone   In regards to anticoagulation, discussed about DOAC with pharmacy and Dr. Saralyn Pilar at [facility] via secure chat on 6/2.  No contraindication but Dr. Saralyn Pilar recommended resuming anticoagulation after outpatient follow-up at cardiology office.        Subjective: Seen and examined earlier this morning.  No major events overnight of this morning.  No bleeding. Tolerating diet  Objective: Vitals:   02/12/22 1616 02/12/22 1914 02/12/22 2325 02/13/22 0759  BP: (!) 129/49 (!) 135/52 (!) 138/51 (!) 125/51  Pulse: 74 70 98 89  Resp: '18 20 20 18  '$ Temp: 98.5 F (36.9 C) 98.4 F (36.9 C) 99.4 F (37.4 C) 99.7 F (37.6 C)  TempSrc:    Oral  SpO2: 96% 96% 91% 93%  Weight:      Height:        Examination:  GENERAL: No apparent distress.  Nontoxic. HEENT: MMM.  Vision and hearing grossly intact.  NECK: Supple.  No apparent JVD.  RESP:  No IWOB.  Fair aeration bilaterally. CVS:  RRR. Heart sounds normal.  ABD/GI/GU: BS+. Abd soft.  Some suprapubic tenderness.  No rebound MSK/EXT:  Moves extremities. No  apparent deformity. No edema.  SKIN: no apparent skin lesion or wound NEURO: Awake and alert. Oriented fairly.  No apparent focal neuro deficit. PSYCH: calm  Procedures:  6/2-EGD normal. 6/3-colonoscopy normal. 6/4-capsule endoscopy normal  Microbiology summarized: 6/4-urine culture negative  Assessment and Plan: * Symptomatic ABLA due to GIB in the setting of supratherapeutic INR Recent Labs    02/08/22 1653 02/09/22 0638 02/10/22 1113 02/10/22 1517 02/11/22 0438  HGB 5.9* 7.9* 7.1* 7.0* 7.9*  Hgb 13.3 on 12/25/2021.  INR > 10 on arrival.  Down to 1.4 after IV vitamin K. She reports melena prior to presentation. Denies NSAID.  Transfused 3 units so far.  Hgb improved to 8.6 and stable. No further bleeding here.  Soft blood pressure but not symptomatic. EGD and colonoscopy without significant finding. Normal capsule endoscopy. -Discontinue warfarin on discharge.  Cardiology to consider [place] outpatient. - no further GI w/u inpatient unless bleeding resumes  Goals of care, counseling/discussion DNR/DNI.  See discussion on 6/2  Hypotension resolved  Coagulopathy/supratherapeutic INR on warfarin Patient denies recent change to her warfarin.  INR > 10 on arrival>> 1.4 after IV vitamin K -Discontinue warfarin on discharge.  Cardiology to consider DOAC at next follow-up outpatient.  Dysuria Resolved. Culture no growth. Antibiotics discontinued  Paroxysmal A-fib (HCC) Currently in sinus rhythm.  Rate controlled. -Holding warfarin.  Discontinue on discharge.  cardiology to consider [place] next follow-up outpatient -resume metop but at  lower dose  CKD-3A Recent Labs  stable  Chronic systolic CHF (congestive heart failure) (HCC) TTE in 2019 with LVEF of 20 to 25%, diffuse hypokinesis without significant valvular disease.  On torsemide, spironolactone, metop at home.  Appears euvolemic on exam.  CXR and serial troponin negative.   - will resume home spiro today  Generalized  weakness Multifactorial.  Therapy recommended SNF but patient and family initially preferred to go home with home health and DME. Changed mind 6/5 so canceled discharge order - TOC consulted for snf, has bed, now pending insurance auth  Dyslipidemia - We will continue statin therapy.  3-vessel CAD Stable.  Had mild chest tightness on arrival likely from anemia.  EKG and troponin without acute finding. -Continue statin and Zetia.  Anxiety and depression Stable.  Continue home meds.  Cerebrovascular disease Stable.  Continue statin    DVT prophylaxis:  SCDs Start: 02/08/22 1929  Code Status: DNR/DNI Family Communication: Updated patient's son telephonically 6/6 Level of care: Med-Surg Status is: Inpatient Remains inpatient appropriate because: unsafe d/c plan  Consultants:  Gastroenterology   Sch Meds:  Scheduled Meds:  atorvastatin  80 mg Oral Daily   buPROPion  100 mg Oral BID   diphenhydrAMINE  25 mg Intravenous Once   ezetimibe  10 mg Oral Daily   metoCLOPramide (REGLAN) injection  10 mg Intravenous Once   metoprolol succinate  12.5 mg Oral Daily   pantoprazole  40 mg Oral Daily   potassium chloride  40 mEq Oral Once   SUMAtriptan  6 mg Subcutaneous Once   vitamin B-12  1,000 mcg Oral Daily   Continuous Infusions:  sodium chloride     PRN Meds:.acetaminophen **OR** acetaminophen, nitroGLYCERIN, ondansetron **OR** ondansetron (ZOFRAN) IV, traZODone  Antimicrobials: Anti-infectives (From admission, onward)    Start     Dose/Rate Route Frequency Ordered Stop   02/12/22 0000  cefdinir (OMNICEF) 300 MG capsule        300 mg Oral 2 times daily 02/12/22 1241     02/10/22 1700  cefTRIAXone (ROCEPHIN) 1 g in sodium chloride 0.9 % 100 mL IVPB  Status:  Discontinued        1 g 200 mL/hr over 30 Minutes Intravenous Every 24 hours 02/10/22 1614 02/12/22 1545        I have personally reviewed the following labs and images: CBC: Recent Labs  Lab 02/10/22 1113  02/10/22 1517 02/11/22 0438 02/12/22 0659 02/13/22 0457  WBC 6.8 7.6 8.2 7.5 6.7  HGB 7.1* 7.0* 7.9* 8.6* 8.6*  HCT 22.2* 21.8* 24.1* 26.2* 25.8*  MCV 95.3 94.8 92.0 91.9 90.5  PLT 198 206 195 242 250   BMP &GFR Recent Labs  Lab 02/09/22 0638 02/10/22 1113 02/11/22 0438 02/12/22 0040 02/12/22 0659 02/13/22 0457  NA 140 140 141 139  --  140  K 3.7 3.5 3.7 3.3*  --  3.4*  CL 114* 114* 116* 114*  --  115*  CO2 22 23 21* 21*  --  22  GLUCOSE 106* 91 94 102*  --  112*  BUN 42* '21 15 11  '$ --  10  CREATININE 1.20* 1.06* 1.09* 1.06*  --  1.10*  CALCIUM 7.8* 7.3* 7.1* 7.8*  --  7.9*  MG  --  1.9 1.8  --  1.9  --   PHOS  --  3.4 3.3  --   --   --    Estimated Creatinine Clearance: 38 mL/min (A) (by C-G formula based on SCr of  1.1 mg/dL (H)). Liver & Pancreas: Recent Labs  Lab 02/08/22 1735 02/10/22 1113 02/11/22 0438 02/12/22 0040  AST 22  --   --  20  ALT 21  --   --  25  ALKPHOS 42  --   --  61  BILITOT 0.6  --   --  0.8  PROT 5.3*  --   --  5.2*  ALBUMIN 2.8* 2.5* 2.3* 2.7*   No results for input(s): LIPASE, AMYLASE in the last 168 hours. No results for input(s): AMMONIA in the last 168 hours. Diabetic: No results for input(s): HGBA1C in the last 72 hours. No results for input(s): GLUCAP in the last 168 hours. Cardiac Enzymes: No results for input(s): CKTOTAL, CKMB, CKMBINDEX, TROPONINI in the last 168 hours. No results for input(s): PROBNP in the last 8760 hours. Coagulation Profile: Recent Labs  Lab 02/08/22 1735 02/09/22 4709 02/10/22 0453 02/11/22 0438 02/12/22 0659  INR >10.0* 1.6* 1.4* 1.4* 1.5*   Thyroid Function Tests: Recent Labs    02/11/22 0800  TSH 3.246   Lipid Profile: No results for input(s): CHOL, HDL, LDLCALC, TRIG, CHOLHDL, LDLDIRECT in the last 72 hours. Anemia Panel: No results for input(s): VITAMINB12, FOLATE, FERRITIN, TIBC, IRON, RETICCTPCT in the last 72 hours.  Urine analysis:    Component Value Date/Time   COLORURINE  YELLOW (A) 02/12/2022 0047   APPEARANCEUR HAZY (A) 02/12/2022 0047   APPEARANCEUR Hazy 04/29/2014 1810   LABSPEC 1.009 02/12/2022 0047   LABSPEC 1.023 04/29/2014 1810   PHURINE 5.0 02/12/2022 0047   GLUCOSEU NEGATIVE 02/12/2022 0047   GLUCOSEU Negative 04/29/2014 1810   HGBUR SMALL (A) 02/12/2022 0047   BILIRUBINUR NEGATIVE 02/12/2022 0047   BILIRUBINUR Negative 04/29/2014 1810   KETONESUR NEGATIVE 02/12/2022 0047   PROTEINUR NEGATIVE 02/12/2022 0047   NITRITE NEGATIVE 02/12/2022 0047   LEUKOCYTESUR SMALL (A) 02/12/2022 0047   LEUKOCYTESUR Trace 04/29/2014 1810   Sepsis Labs: Invalid input(s): PROCALCITONIN, [employer]  Microbiology: Recent Results (from the past 240 hour(s))  Urine Culture     Status: None   Collection Time: 02/10/22  4:14 PM   Specimen: Urine, Random  Result Value Ref Range Status   Specimen Description   Final    URINE, RANDOM Performed at [facility], [address] [place], [place] [address]    Special Requests   Final    NONE Performed at [facility], [address] [place], [place] [address]    Culture   Final    NO GROWTH Performed at [facility], [place] [address], [place], [place] [address]    Report Status 02/12/2022 FINAL  Final    Radiology Studies: No results found.    Laurey Arrow, MD [facility]  If 7PM-7AM, please contact night-coverage [email or web address] 02/13/2022, 2:24 PM

## 2022-02-14 ENCOUNTER — Telehealth: Payer: Self-pay

## 2022-02-14 NOTE — Telephone Encounter (Signed)
Transition Care Management Unsuccessful Follow-up Telephone Call  Date of discharge and from where:  02/13/22 [facility]  Attempts:  2nd Attempt  Reason for unsuccessful TCM follow-up call:  Left voice message

## 2022-02-14 NOTE — Telephone Encounter (Signed)
Transition Care Management Unsuccessful Follow-up Telephone Call  Date of discharge and from where:  02/13/22 [facility]  Attempts:  1st Attempt  Reason for unsuccessful TCM follow-up call:  Left voice message

## 2022-02-15 ENCOUNTER — Encounter: Payer: Self-pay | Admitting: Family Medicine

## 2022-02-15 ENCOUNTER — Ambulatory Visit (INDEPENDENT_AMBULATORY_CARE_PROVIDER_SITE_OTHER): Payer: Medicare HMO | Admitting: Family Medicine

## 2022-02-15 VITALS — BP 120/80 | HR 80 | Ht 66.0 in | Wt 167.0 lb

## 2022-02-15 DIAGNOSIS — K219 Gastro-esophageal reflux disease without esophagitis: Secondary | ICD-10-CM

## 2022-02-15 DIAGNOSIS — F3341 Major depressive disorder, recurrent, in partial remission: Secondary | ICD-10-CM | POA: Diagnosis not present

## 2022-02-15 DIAGNOSIS — D500 Iron deficiency anemia secondary to blood loss (chronic): Secondary | ICD-10-CM | POA: Diagnosis not present

## 2022-02-15 DIAGNOSIS — R42 Dizziness and giddiness: Secondary | ICD-10-CM | POA: Diagnosis not present

## 2022-02-15 MED ORDER — BUPROPION HCL 100 MG PO TABS: 100.0000 mg | ORAL_TABLET | Freq: Two times a day (BID) | ORAL | 1 refills | Status: DC

## 2022-02-15 MED ORDER — OMEPRAZOLE 40 MG PO CPDR: 40.0000 mg | DELAYED_RELEASE_CAPSULE | Freq: Two times a day (BID) | ORAL | 1 refills | Status: DC

## 2022-02-15 NOTE — Progress Notes (Signed)
Date:  02/15/2022   Name:  Veronica Ellis   DOB:  04-22-1934   MRN:  [id number]   Chief Complaint: Follow-up  Pt was recently admitted to [facility] for upper gi bleed on 6/1 and was discharged on 6/6. Transition of care call placed on 6/7.     GI Problem The primary symptoms include melena. Primary symptoms do not include fever, weight loss, fatigue, abdominal pain, nausea, vomiting, diarrhea, hematemesis, jaundice, hematochezia, dysuria, myalgias, arthralgias or rash.  The illness does not include dysphagia, bloating or constipation. Associated medical issues do not include liver disease or alcohol abuse.   [facility] Discharge Acute Issues Care Follow Up                                                                        Patient Demographics  Veronica Ellis, is a 88 y.o. female  DOB Feb 01, 1934  MRN [id number].  Primary MD  Juline Patch, MD   Reason for TCC follow Up -presents today for follow-up for discharge from upper GI bleed with resultant anemia and symptomatology hypotension.  Patient is currently not dizzy and has a normal blood pressure.  We will recheck her hemoglobin hematocrit and PT INR.   Past Medical History:  Diagnosis Date   Anxiety    Anxiety    Arthritis    Breast cancer ([place]) 2014   bilateral invasive mammary carcinoma. with 36 rad tx.    Breast cancer ([place])    Cancer ([place])    breast   Depression    Depression    Hard of hearing    Hyperlipemia    Hypertension    IBS (irritable bowel syndrome)    Memory loss    Osteoporosis    Personal history of radiation therapy    Wears dentures    full upper, partial lower    Past Surgical History:  Procedure Laterality Date   APPENDECTOMY     BREAST BIOPSY Bilateral 2014   BREAST LUMPECTOMY Bilateral 05/2013   BREAST SURGERY Bilateral    CATARACT EXTRACTION W/PHACO Right  12/11/2017   Procedure: CATARACT EXTRACTION PHACO AND INTRAOCULAR LENS PLACEMENT ([place]) COMPLICATED RIGHT;  Surgeon: Leandrew Koyanagi, MD;  Location: [place];  Service: Ophthalmology;  Laterality: Right;   COLON SURGERY     12 in. removed due to polyps   COLONOSCOPY WITH PROPOFOL N/A 02/10/2022   Procedure: COLONOSCOPY WITH PROPOFOL;  Surgeon: Lin Landsman, MD;  Location[facility] ENDOSCOPY;  Service: Gastroenterology;  Laterality: N/A;   ESOPHAGOGASTRODUODENOSCOPY (EGD) WITH PROPOFOL N/A 02/09/2022   Procedure: ESOPHAGOGASTRODUODENOSCOPY (EGD) WITH PROPOFOL;  Surgeon: Jonathon Bellows, MD;  Location[facility] ENDOSCOPY;  Service: Gastroenterology;  Laterality: N/A;   GIVENS CAPSULE STUDY N/A 02/11/2022   Procedure: GIVENS CAPSULE STUDY;  Surgeon: Lin Landsman, MD;  Location: [facility] ENDOSCOPY;  Service: Gastroenterology;  Laterality: N/A;   LEFT HEART CATH AND CORONARY ANGIOGRAPHY N/A 05/20/2018   Procedure: LEFT HEART CATH AND  CORONARY ANGIOGRAPHY;  Surgeon: Corey Skains, MD;  Location: [place] CV LAB;  Service: Cardiovascular;  Laterality: N/A;   TUBAL LIGATION         Recent HPI and Hospital Course  While hospitalized patient was stabilized with transfusions. Currently patient is at baseline without orthostatic dizziness.  While hospitalized she underwent procedures including endoscopy, colonoscopy, and capsule evaluation. [facility] Acute Care Issue to be followed in the Clinic   Upon return home patient has done well has been able to eat without nausea, without hematemesis, nor has she experienced any melena or lower GI bleeding symptoms.   Subjective:   Veronica Ellis today has, No headache, No chest pain, No abdominal pain - No Nausea, No new weakness tingling or numbness, No Cough - SOB.   Assessment & Plan     Reason for frequent admissions/ER visits **      Objective:   Vitals:   02/15/22 1329  BP: 120/80  Pulse: 80  Weight: 167 lb (75.8 kg)   Height: '5\' 6"'$  (1.676 m)    Wt Readings from Last 3 Encounters:  02/15/22 167 lb (75.8 kg)  02/08/22 165 lb 3.2 oz (74.9 kg)  01/17/22 164 lb (74.4 kg)    Allergies as of 02/15/2022       Reactions   Iodine Nausea Only   Betadine okay   Erythromycin Rash   Other Rash   Penicillins Rash   Has patient had a PCN reaction causing immediate rash, facial/tongue/throat swelling, SOB or lightheadedness with hypotension: No Has patient had a PCN reaction causing severe rash involving mucus membranes or skin necrosis: No Has patient had a PCN reaction that required hospitalization: No Has patient had a PCN reaction occurring within the last 10 years: No If all of the above answers are "NO", then may proceed with Cephalosporin use.   Shellfish Allergy Rash        Medication List        Accurate as of February 15, 2022  1:46 PM. If you have any questions, ask your nurse or doctor.          atorvastatin 80 MG tablet Commonly known as: LIPITOR Take 80 mg by mouth at bedtime.   buPROPion 100 MG tablet Commonly known as: WELLBUTRIN Take 1 tablet (100 mg total) by mouth 2 (two) times daily.   ezetimibe 10 MG tablet Commonly known as: ZETIA Take by mouth. Take 1 tablet (10 mg total) by mouth once daily   magnesium oxide 400 MG tablet Commonly known as: MAG-OX Take 1 tablet by mouth daily.   metoprolol succinate 25 MG 24 hr tablet Commonly known as: TOPROL-XL Take 0.5 tablets (12.5 mg total) by mouth daily.   nitroGLYCERIN 0.4 MG SL tablet Commonly known as: NITROSTAT Place 0.4 mg under the tongue every 5 (five) minutes as needed for chest pain.   omeprazole 40 MG capsule Commonly known as: PRILOSEC Take 1 capsule (40 mg total) by mouth 2 (two) times daily.   spironolactone 25 MG tablet Commonly known as: ALDACTONE Take 25 mg by mouth at bedtime.   torsemide 10 MG tablet Commonly known as: DEMADEX Take by mouth. Take 1 tablet (10 mg total) by mouth once daily as needed  (take if weight increases by 2lbs in 1 day or 5lbs in 1 week from 150lbs.)         Physical Exam: Constitutional: Patient appears well-developed and well-nourished. Not in obvious distress. HENT: Normocephalic, atraumatic, External right and left  ear normal. Oropharynx is clear and moist.  Eyes: Conjunctivae and EOM are normal. PERRLA, no scleral icterus. Neck: Normal ROM. Neck supple. No JVD. No tracheal deviation. No thyromegaly. CVS: RRR, S1/S2 +, no murmurs, no gallops, no carotid bruit.  Pulmonary: Effort and breath sounds normal, no stridor, rhonchi, wheezes, rales.  Abdominal: Soft. BS +, no distension, tenderness, rebound or guarding.  Musculoskeletal: Normal range of motion. No edema and no tenderness.  Lymphadenopathy: No lymphadenopathy noted, cervical, inguinal or axillary Neuro: Alert. Normal reflexes, muscle tone coordination. No cranial nerve deficit. Skin: Skin is warm and dry. No rash noted. Not diaphoretic. No erythema. No pallor. Psychiatric: Normal mood and affect. Behavior, judgment, thought content normal.   Data Review   Micro Results Recent Results (from the past 240 hour(s))  Urine Culture     Status: None   Collection Time: 02/10/22  4:14 PM   Specimen: Urine, Random  Result Value Ref Range Status   Specimen Description   Final    URINE, RANDOM Performed at [facility], [address], [place], [place] [address]    Special Requests   Final    NONE Performed at [facility], [address] [place], [place] [address]    Culture   Final    NO GROWTH Performed at [facility], [address], [place], [place] [address]    Report Status 02/12/2022 FINAL  Final     CBC Recent Labs  Lab 02/10/22 1113 02/10/22 1517 02/11/22 0438 02/12/22 0659 02/13/22 0457  WBC 6.8 7.6 8.2 7.5 6.7  HGB 7.1* 7.0* 7.9* 8.6* 8.6*  HCT 22.2* 21.8* 24.1* 26.2* 25.8*  PLT 198 206 195 242 250  MCV 95.3 94.8 92.0 91.9 90.5  MCH 30.5 30.4  30.2 30.2 30.2  MCHC 32.0 32.1 32.8 32.8 33.3  RDW 17.8* 17.6* 18.6* 17.8* 17.4*    Chemistries  Recent Labs  Lab 02/08/22 1735 02/09/22 9509 02/10/22 1113 02/11/22 0438 02/12/22 0040 02/12/22 0659 02/13/22 0457  NA  --  140 140 141 139  --  140  K  --  3.7 3.5 3.7 3.3*  --  3.4*  CL  --  114* 114* 116* 114*  --  115*  CO2  --  22 23 21* 21*  --  22  GLUCOSE  --  106* 91 94 102*  --  112*  BUN  --  42* '21 15 11  '$ --  10  CREATININE  --  1.20* 1.06* 1.09* 1.06*  --  1.10*  CALCIUM  --  7.8* 7.3* 7.1* 7.8*  --  7.9*  MG  --   --  1.9 1.8  --  1.9  --   AST 22  --   --   --  20  --   --   ALT 21  --   --   --  25  --   --   ALKPHOS 42  --   --   --  61  --   --   BILITOT 0.6  --   --   --  0.8  --   --    ------------------------------------------------------------------------------------------------------------------ estimated creatinine clearance is 38.2 mL/min (A) (by C-G formula based on SCr of 1.1 mg/dL (H)). ------------------------------------------------------------------------------------------------------------------ No results for input(s): "HGBA1C" in the last 72 hours. ------------------------------------------------------------------------------------------------------------------ No results for input(s): "CHOL", "HDL", "LDLCALC", "TRIG", "CHOLHDL", "LDLDIRECT" in the last 72 hours. ------------------------------------------------------------------------------------------------------------------ No results for input(s): "TSH", "T4TOTAL", "T3FREE", "THYROIDAB" in the last  72 hours.  Invalid input(s): "FREET3" ------------------------------------------------------------------------------------------------------------------ No results for input(s): "VITAMINB12", "FOLATE", "FERRITIN", "TIBC", "IRON", "RETICCTPCT" in the last 72 hours.  Coagulation profile Recent Labs  Lab 02/08/22 1735 02/09/22 1761 02/10/22 0453 02/11/22 0438 02/12/22 0659  INR >10.0* 1.6*  1.4* 1.4* 1.5*    No results for input(s): "DDIMER" in the last 72 hours.  Cardiac Enzymes No results for input(s): "CKMB", "TROPONINI", "MYOGLOBIN" in the last 168 hours.  Invalid input(s): "CK" ------------------------------------------------------------------------------------------------------------------ Invalid input(s): "POCBNP"   Time Spent in minutes  30   Otilio Miu M.D on 02/15/2022 at 1:46 PM      Lab Results  Component Value Date   NA 140 02/13/2022   K 3.4 (L) 02/13/2022   CO2 22 02/13/2022   GLUCOSE 112 (H) 02/13/2022   BUN 10 02/13/2022   CREATININE 1.10 (H) 02/13/2022   CALCIUM 7.9 (L) 02/13/2022   GFRNONAA 49 (L) 02/13/2022   Lab Results  Component Value Date   CHOL 129 05/19/2020   HDL 52 05/19/2020   LDLCALC 56 05/19/2020   TRIG 120 05/19/2020   CHOLHDL 3.6 05/20/2018   Lab Results  Component Value Date   TSH 3.246 02/11/2022   Lab Results  Component Value Date   HGBA1C 5.4 05/19/2018   Lab Results  Component Value Date   WBC 6.7 02/13/2022   HGB 8.6 (L) 02/13/2022   HCT 25.8 (L) 02/13/2022   MCV 90.5 02/13/2022   PLT 250 02/13/2022   Lab Results  Component Value Date   ALT 25 02/12/2022   AST 20 02/12/2022   ALKPHOS 61 02/12/2022   BILITOT 0.8 02/12/2022   No results found for: "25OHVITD2", "25OHVITD3", "VD25OH"   Review of Systems  Constitutional:  Negative for fatigue, fever and weight loss.  Gastrointestinal:  Positive for melena. Negative for abdominal distention, abdominal pain, anal bleeding, bloating, blood in stool, constipation, diarrhea, dysphagia, hematemesis, hematochezia, jaundice, nausea, rectal pain and vomiting.  Genitourinary:  Negative for dysuria.  Musculoskeletal:  Negative for arthralgias and myalgias.  Skin:  Negative for rash.    Patient Active Problem List   Diagnosis Date Noted   Symptomatic ABLA due to GIB in the setting of supratherapeutic INR 02/09/2022   Supratherapeutic INR 02/09/2022    Goals of care, counseling/discussion 02/09/2022   Paroxysmal A-fib ([place]) 02/09/2022   Urinary tract infection 02/09/2022   Generalized weakness 02/09/2022   GI bleeding 02/08/2022   Coagulopathy/supratherapeutic INR on warfarin 02/08/2022   CKD-3A 02/08/2022   Dyslipidemia 02/08/2022   Depression 02/08/2022   Hypotension 02/08/2022   GERD without esophagitis 08/31/2019   History of IBS 09/26/2018   Long term (current) use of anticoagulants 07/17/2018   3-vessel CAD 07/08/2018   Acute on chronic combined systolic and diastolic CHF (congestive heart failure) ([place]) 07/08/2018   Ischemic cardiomyopathy 07/08/2018   AKI (acute kidney injury) ([place]) 06/06/2018   Hyperkalemia 06/05/2018   Atrial fibrillation with RVR ([place]) 05/21/2018   Symptomatic anemia 05/21/2018   NSTEMI (non-ST elevated myocardial infarction) ([place]) 60/73/7106   Chronic systolic CHF (congestive heart failure) ([place]) 07/30/2017   LBBB (left bundle branch block) 07/10/2017   Lumbar radiculopathy 05/16/2017   Hx of low back pain 05/14/2017   Ataxia 12/31/2016   Memory loss 12/31/2016   Recurrent major depressive disorder, in partial remission ([employer]) 12/31/2016   Bipolar 1 disorder, mixed, moderate ([place]) 12/25/2016   Chronic bilateral low back pain with bilateral sciatica 10/18/2016   Bilateral breast cancer ([place]) 02/20/2016   Familial multiple lipoprotein-type hyperlipidemia  02/16/2015   Cerebrovascular disease 02/16/2015   Anxiety and depression 02/16/2015   Breast lump 02/16/2015   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/16/2015   Tobacco abuse, in remission 02/16/2015   Routine general medical examination at a health care facility 02/16/2015   Recurrent major depressive episodes ([place]) 02/16/2015   Below normal amount of sodium in the blood 02/16/2015   Pre-operative cardiovascular examination 02/16/2015    Allergies  Allergen Reactions   Iodine Nausea Only    Betadine okay    Erythromycin Rash   Other Rash    Penicillins Rash    Has patient had a PCN reaction causing immediate rash, facial/tongue/throat swelling, SOB or lightheadedness with hypotension: No Has patient had a PCN reaction causing severe rash involving mucus membranes or skin necrosis: No Has patient had a PCN reaction that required hospitalization: No Has patient had a PCN reaction occurring within the last 10 years: No If all of the above answers are "NO", then may proceed with Cephalosporin use.    Shellfish Allergy Rash    Past Surgical History:  Procedure Laterality Date   APPENDECTOMY     BREAST BIOPSY Bilateral 2014   BREAST LUMPECTOMY Bilateral 05/2013   BREAST SURGERY Bilateral    CATARACT EXTRACTION W/PHACO Right 12/11/2017   Procedure: CATARACT EXTRACTION PHACO AND INTRAOCULAR LENS PLACEMENT ([place]) COMPLICATED RIGHT;  Surgeon: Leandrew Koyanagi, MD;  Location: [place];  Service: Ophthalmology;  Laterality: Right;   COLON SURGERY     12 in. removed due to polyps   COLONOSCOPY WITH PROPOFOL N/A 02/10/2022   Procedure: COLONOSCOPY WITH PROPOFOL;  Surgeon: Lin Landsman, MD;  Location: [facility] ENDOSCOPY;  Service: Gastroenterology;  Laterality: N/A;   ESOPHAGOGASTRODUODENOSCOPY (EGD) WITH PROPOFOL N/A 02/09/2022   Procedure: ESOPHAGOGASTRODUODENOSCOPY (EGD) WITH PROPOFOL;  Surgeon: Jonathon Bellows, MD;  Location: [facility] ENDOSCOPY;  Service: Gastroenterology;  Laterality: N/A;   GIVENS CAPSULE STUDY N/A 02/11/2022   Procedure: GIVENS CAPSULE STUDY;  Surgeon: Lin Landsman, MD;  Location: [facility] ENDOSCOPY;  Service: Gastroenterology;  Laterality: N/A;   LEFT HEART CATH AND CORONARY ANGIOGRAPHY N/A 05/20/2018   Procedure: LEFT HEART CATH AND CORONARY ANGIOGRAPHY;  Surgeon: Corey Skains, MD;  Location: [place] CV LAB;  Service: Cardiovascular;  Laterality: N/A;   TUBAL LIGATION      Social History   Tobacco Use   Smoking status: Former    Packs/day: 1.00    Years: 60.00    Total pack years: 60.00     Types: Cigarettes    Quit date: 12/2017    Years since quitting: 4.1   Smokeless tobacco: Never   Tobacco comments:    smoking cessation materials not required  Vaping Use   Vaping Use: Never used  Substance Use Topics   Alcohol use: Not Currently    Alcohol/week: 0.0 standard drinks of alcohol   Drug use: No     Medication list has been reviewed and updated.  Current Meds  Medication Sig   atorvastatin (LIPITOR) 80 MG tablet Take 80 mg by mouth at bedtime.   buPROPion (WELLBUTRIN) 100 MG tablet Take 1 tablet (100 mg total) by mouth 2 (two) times daily.   ezetimibe (ZETIA) 10 MG tablet Take by mouth. Take 1 tablet (10 mg total) by mouth once daily   magnesium oxide (MAG-OX) 400 MG tablet Take 1 tablet by mouth daily.   metoprolol succinate (TOPROL-XL) 25 MG 24 hr tablet Take 0.5 tablets (12.5 mg total) by mouth daily.   nitroGLYCERIN (NITROSTAT) 0.4 MG  SL tablet Place 0.4 mg under the tongue every 5 (five) minutes as needed for chest pain.   omeprazole (PRILOSEC) 40 MG capsule Take 1 capsule (40 mg total) by mouth 2 (two) times daily.   spironolactone (ALDACTONE) 25 MG tablet Take 25 mg by mouth at bedtime.   torsemide (DEMADEX) 10 MG tablet Take by mouth. Take 1 tablet (10 mg total) by mouth once daily as needed (take if weight increases by 2lbs in 1 day or 5lbs in 1 week from 150lbs.)       02/15/2022    1:33 PM 12/21/2021    1:17 PM 06/22/2021    1:21 PM 12/20/2020    1:26 PM  GAD 7 : Generalized Anxiety Score  Nervous, Anxious, on Edge 0 1 1 0  Control/stop worrying 0 0 1 0  Worry too much - different things 0 0 1 0  Trouble relaxing 0 0 2 0  Restless 0 0 2 0  Easily annoyed or irritable 0 0 2 0  Afraid - awful might happen 0 0 0 0  Total GAD 7 Score 0 1 9 0  Anxiety Difficulty Not difficult at all Not difficult at all Not difficult at all        02/15/2022    1:33 PM  Depression screen PHQ 2/9  Decreased Interest 0  Down, Depressed, Hopeless 0  PHQ - 2 Score 0   Altered sleeping 1  Tired, decreased energy 0  Change in appetite 0  Feeling bad or failure about yourself  1  Trouble concentrating 0  Moving slowly or fidgety/restless 1  Suicidal thoughts 0  PHQ-9 Score 3  Difficult doing work/chores Somewhat difficult    BP Readings from Last 3 Encounters:  02/15/22 120/80  02/13/22 (!) 135/51  01/17/22 120/70    Physical Exam  Wt Readings from Last 3 Encounters:  02/15/22 167 lb (75.8 kg)  02/08/22 165 lb 3.2 oz (74.9 kg)  01/17/22 164 lb (74.4 kg)    BP 120/80   Pulse 80   Ht '5\' 6"'$  (1.676 m)   Wt 167 lb (75.8 kg)   BMI 26.95 kg/m   Assessment and Plan:  1. Anemia due to GI blood loss New onset.  Stable.  Controlled.  Patient is not experiencing any symptoms of GI bleeding such as hematemesis, rectal bleeding nor melena.  Blood pressure is stable at 120/80 and pulse rate is normal.  We will check hematocrit and hemoglobin for current status of blood volume and recheck PT/INR which has stabilized since receiving vitamin K therapy. - INR/PT  2. Orthostatic dizziness New onset.  Resolved.  Stable.  No orthostatic dizziness has been recorded and we will check current level of blood volume status with hemoglobin and hematocrit. - Hematocrit - Hemoglobin - INR/PT  3. Recurrent major depressive disorder, in partial remission (HCC) Chronic.  Controlled.  Stable with.  Patient is currently doing well with her depression but her anxiety is still somewhat elevated since her recent hospitalization but this is gradually resolving.  We will continue on bupropion 100 mg twice a day. - buPROPion (WELLBUTRIN) 100 MG tablet; Take 1 tablet (100 mg total) by mouth 2 (two) times daily.  Dispense: 180 tablet; Refill: 1  4. GERD without esophagitis Chronic.  Controlled.  Stable.  Patient is to be followed by GI in the meantime we will continue her omeprazole 40 mg twice a day. - omeprazole (PRILOSEC) 40 MG capsule; Take 1 capsule (40 mg total) by  mouth 2 (two) times daily.  Dispense: 180 capsule; Refill: 1

## 2022-02-16 ENCOUNTER — Telehealth: Payer: Self-pay | Admitting: Family Medicine

## 2022-02-16 LAB — HEMOGLOBIN: Hemoglobin: 9.7 g/dL — ABNORMAL LOW (ref 11.1–15.9)

## 2022-02-16 LAB — HEMATOCRIT: Hematocrit: 28.9 % — ABNORMAL LOW (ref 34.0–46.6)

## 2022-02-16 LAB — PROTIME-INR
INR: 1.1 (ref 0.9–1.2)
Prothrombin Time: 11.1 s (ref 9.1–12.0)

## 2022-02-16 NOTE — Telephone Encounter (Signed)
[place] called to let Dr. Ronnald Ramp know that The family requested for her to wait until tomorrow to begin pts physical therapy / so they will be out to her home tomorrow

## 2022-02-19 ENCOUNTER — Ambulatory Visit (INDEPENDENT_AMBULATORY_CARE_PROVIDER_SITE_OTHER): Payer: Medicare HMO | Admitting: Surgery

## 2022-02-19 ENCOUNTER — Encounter: Payer: Self-pay | Admitting: Surgery

## 2022-02-19 VITALS — BP 132/70 | HR 61 | Temp 98.9°F | Ht 66.0 in | Wt 164.6 lb

## 2022-02-19 DIAGNOSIS — K8020 Calculus of gallbladder without cholecystitis without obstruction: Secondary | ICD-10-CM

## 2022-02-19 NOTE — Progress Notes (Signed)
02/19/2022  Reason for Visit:  Cholelithiasis  Requesting Provider:  Otilio Miu, MD  History of Present Illness: Veronica Ellis is a 88 y.o. female presenting for evaluation of cholelithiasis.  Patient was recently admitted on 02/08/2022 with dizziness and symptomatic anemia with a hemoglobin of 5.9 in the setting of a supratherapeutic INR of more than 10.  She was reversed to have blood transfusions.  GI evaluated the patient and did upper endoscopy, colonoscopy, and capsule study which did not show any source of bleeding.  She was discharged eventually on 02/13/2022.  The patient reports that she still has some weakness but feels better.  Prior to this, the patient had a lumbar x-ray on 12/22/2021 for back pain and this showed a large calcified gallstone in the right upper quadrant.  Evaluating prior imaging studies, she has been found to have a large gallstone dating back at least 10 years.  The patient reports that she knows that she had gallstones but does not think that has been giving her any troubles.  Patient does have some confusion issues she comes in with her son who takes care of her at home.  Per her son, he has not noticed any episodes of right upper quadrant pain, nausea, or vomiting.  The patient does have a history of different medical issues particularly an NSTEMI in 2019, CVA, ischemic cardiomyopathy, IBS, atrial fibrillation managed with Coumadin.  Past Medical History: Past Medical History:  Diagnosis Date   Anxiety    Anxiety    Arthritis    Breast cancer ([place]) 2014   bilateral invasive mammary carcinoma. with 36 rad tx.    Breast cancer ([place])    Cancer ([place])    breast   Depression    Depression    Hard of hearing    Hyperlipemia    Hypertension    IBS (irritable bowel syndrome)    Memory loss    Osteoporosis    Personal history of radiation therapy    Wears dentures    full upper, partial lower     Past Surgical History: Past Surgical History:   Procedure Laterality Date   APPENDECTOMY     BREAST BIOPSY Bilateral 2014   BREAST LUMPECTOMY Bilateral 05/2013   BREAST SURGERY Bilateral    CATARACT EXTRACTION W/PHACO Right 12/11/2017   Procedure: CATARACT EXTRACTION PHACO AND INTRAOCULAR LENS PLACEMENT ([place]) COMPLICATED RIGHT;  Surgeon: Leandrew Koyanagi, MD;  Location: [place];  Service: Ophthalmology;  Laterality: Right;   COLON SURGERY     12 in. removed due to polyps   COLONOSCOPY WITH PROPOFOL N/A 02/10/2022   Procedure: COLONOSCOPY WITH PROPOFOL;  Surgeon: Lin Landsman, MD;  Location: [facility] ENDOSCOPY;  Service: Gastroenterology;  Laterality: N/A;   ESOPHAGOGASTRODUODENOSCOPY (EGD) WITH PROPOFOL N/A 02/09/2022   Procedure: ESOPHAGOGASTRODUODENOSCOPY (EGD) WITH PROPOFOL;  Surgeon: Jonathon Bellows, MD;  Location: [facility] ENDOSCOPY;  Service: Gastroenterology;  Laterality: N/A;   GIVENS CAPSULE STUDY N/A 02/11/2022   Procedure: GIVENS CAPSULE STUDY;  Surgeon: Lin Landsman, MD;  Location: [facility] ENDOSCOPY;  Service: Gastroenterology;  Laterality: N/A;   LEFT HEART CATH AND CORONARY ANGIOGRAPHY N/A 05/20/2018   Procedure: LEFT HEART CATH AND CORONARY ANGIOGRAPHY;  Surgeon: Corey Skains, MD;  Location: [place] [facility];  Service: Cardiovascular;  Laterality: N/A;   TUBAL LIGATION      Home Medications: Prior to Admission medications   Medication Sig Start Date End Date Taking? Authorizing Provider  atorvastatin (LIPITOR) 80 MG tablet Take 80 mg by  mouth at bedtime.   Yes [provider]  buPROPion (WELLBUTRIN) 100 MG tablet Take 1 tablet (100 mg total) by mouth 2 (two) times daily. 02/15/22  Yes Juline Patch, MD  ezetimibe (ZETIA) 10 MG tablet Take by mouth. Take 1 tablet (10 mg total) by mouth once daily 12/26/21  Yes [provider]  magnesium oxide (MAG-OX) 400 MG tablet Take 1 tablet by mouth daily. 12/26/21 12/26/22 Yes [provider]  metoprolol succinate (TOPROL-XL) 25 MG 24 hr  tablet Take 0.5 tablets (12.5 mg total) by mouth daily. 02/13/22  Yes Wouk, Ailene Rud, MD  nitroGLYCERIN (NITROSTAT) 0.4 MG SL tablet Place 0.4 mg under the tongue every 5 (five) minutes as needed for chest pain.   Yes [provider]  omeprazole (PRILOSEC) 40 MG capsule Take 1 capsule (40 mg total) by mouth 2 (two) times daily. 02/15/22  Yes Juline Patch, MD  spironolactone (ALDACTONE) 25 MG tablet Take 25 mg by mouth at bedtime. 12/31/19  Yes [provider]  torsemide (DEMADEX) 10 MG tablet Take by mouth. Take 1 tablet (10 mg total) by mouth once daily as needed (take if weight increases by 2lbs in 1 day or 5lbs in 1 week from 150lbs.) 12/26/21  Yes [provider]    Allergies: Allergies  Allergen Reactions   Iodine Nausea Only    Betadine okay    Erythromycin Rash   Other Rash   Penicillins Rash    Has patient had a PCN reaction causing immediate rash, facial/tongue/throat swelling, SOB or lightheadedness with hypotension: No Has patient had a PCN reaction causing severe rash involving mucus membranes or skin necrosis: No Has patient had a PCN reaction that required hospitalization: No Has patient had a PCN reaction occurring within the last 10 years: No If all of the above answers are "NO", then may proceed with Cephalosporin use.    Shellfish Allergy Rash    Social History:  reports that she quit smoking about 4 years ago. Her smoking use included cigarettes. She has a 60.00 pack-year smoking history. She has never used smokeless tobacco. She reports that she does not currently use alcohol. She reports that she does not use drugs.   Family History: Family History  Problem Relation Age of Onset   Breast cancer Daughter 56   Breast cancer Daughter 6   Cirrhosis Mother    Lung cancer Father     Review of Systems: Review of Systems  Constitutional:  Positive for malaise/fatigue. Negative for chills and fever.  HENT:  Negative for hearing loss.    Respiratory:  Negative for shortness of breath.   Cardiovascular:  Negative for chest pain.  Gastrointestinal:  Negative for abdominal pain, nausea and vomiting.  Genitourinary:  Negative for dysuria.  Musculoskeletal:  Negative for myalgias.  Skin:  Negative for rash.  Neurological:  Negative for dizziness.  Psychiatric/Behavioral:  Negative for depression.     Physical Exam BP 132/70   Pulse 61   Temp 98.9 F (37.2 C) (Oral)   Ht '5\' 6"'$  (1.676 m)   Wt 164 lb 9.6 oz (74.7 kg)   SpO2 94%   BMI 26.57 kg/m  CONSTITUTIONAL: No acute distress, well-nourished HEENT:  Normocephalic, atraumatic, extraocular motion intact. NECK: Trachea is midline, and there is no jugular venous distension.  RESPIRATORY:  Lungs are clear, and breath sounds are equal bilaterally. Normal respiratory effort without pathologic use of accessory muscles. CARDIOVASCULAR: Heart is regular without murmurs, gallops, or rubs. GI: The  abdomen is soft, nondistended, nontender to palpation.  Negative Murphy sign.   MUSCULOSKELETAL:  Normal muscle strength and tone in all four extremities.  No peripheral edema or cyanosis. SKIN: Skin turgor is normal. There are no pathologic skin lesions.  NEUROLOGIC:  Motor and sensation is grossly normal.  Cranial nerves are grossly intact. PSYCH:  Affect is normal.  Laboratory Analysis: Labs from 02/08/2022: Sodium 138, potassium 4, chloride 111, CO2 22, BUN 45, creatinine 1.31.  Total bilirubin 0.6, AST 22, ALT 21, alkaline phosphatase 42, albumin 2.8.  WBC 15.6, hemoglobin 5.9, hematocrit 19.3, platelets 288.  INR > 10  Imaging: Lumbar Xray 12/21/21: IMPRESSION: Mild multilevel degenerative changes as described above. No acute abnormality seen.   Probable large gallstone.   Aortic Atherosclerosis (ICD10-I70.0).  CT abdomen/pelvis 03/04/20: IMPRESSION: 1. Cholelithiasis without evidence of acute cholecystitis. 2. Stable mild to moderate severity right-sided hydronephrosis  and hydroureter. 3. Stable bilateral low-attenuation adrenal nodules which may represent adrenal adenomas. 4. Stable 5.9 cm x 4.0 cm left adnexal cyst. 5. Aortic atherosclerosis.   Assessment and Plan: This is a 88 y.o. female with cholelithiasis.  - Patient is improving after recent episode of GI bleed with unknown specific source in the setting of supratherapeutic INR of more than 10.  She is feeling better today compared to her discharge date.  Although this referral was not for the her GI bleed episode, the patient was unsure why she was seeing me today.  I explained to her that the appointment today was for cholelithiasis.  The patient does not appear to be symptomatic from her gallstone found on prior imaging studies.  The patient's son does not recall episodes of right upper quadrant pain associated with any nausea or vomiting.  At this point, I think the finding of her large gallstone is likely incidental in nature and although has been present for many years, does not appear to be causing issues at this point. - I discussed with the patient and her son the symptoms to look out for when it comes to biliary colic such as right upper quadrant or epigastric abdominal pain that radiates to the back, nausea, vomiting.  Discussed with them that these episodes typically happen after eating and may be more exacerbated by eating greasy or fatty foods.  We will give the patient information about low-fat diet for her. - Encouraged the patient and her son to monitor any potential symptoms and to give [place] a call if they notice any issues as described above.  Otherwise she may follow-up with [place] as needed.  I spent 40 minutes dedicated to the care of this patient on the date of this encounter to include pre-visit review of records, face-to-face time with the patient discussing diagnosis and management, and any post-visit coordination of care.   Melvyn Neth, [facility]

## 2022-02-19 NOTE — Patient Instructions (Signed)
If you have any concerns or questions, please feel free to call our office.   Cholelithiasis  Cholelithiasis happens when gallstones form in the gallbladder. The gallbladder stores bile. Bile is a fluid that helps digest fats. Bile can harden and form into gallstones. If they cause a blockage, they can cause pain (gallbladder attack). What are the causes? This condition may be caused by: Some blood diseases, such as sickle cell anemia. Too much of a fat-like substance (cholesterol) in your bile. Not enough bile salts in your bile. These salts help the body absorb and digest fats. The gallbladder not emptying fully or often enough. This is common in pregnant women. What increases the risk? The following factors may make you more likely to develop this condition: Being female. Being pregnant many times. Eating a lot of fried foods, fat, and refined carbs (refined carbohydrates). Being very overweight (obese). Being older than age 86. Using medicines with female hormones in them for a long time. Losing weight fast. Having gallstones in your family. Having some health problems, such as diabetes, Crohn's disease, or liver disease. What are the signs or symptoms? Often, there may be gallstones but no symptoms. These gallstones are called silent gallstones. If a gallstone causes a blockage, you may get sudden pain. The pain: Can be in the upper right part of your belly (abdomen). Normally comes at night or after you eat. Can last an hour or more. Can spread to your right shoulder, back, or chest. Can feel like discomfort, burning, or fullness in the upper part of your belly (indigestion). If the blockage lasts more than a few hours, you can get an infection or swelling. You may: Feel like you may vomit. Vomit. Feel bloated. Have belly pain for 5 hours or more. Feel tender in your belly, often in the upper right part and under your ribs. Have fever or chills. Have skin or the white parts  of your eyes turn yellow (jaundice). Have dark pee (urine) or pale poop (stool). How is this treated? Treatment for this condition depends on how bad you feel. If you have symptoms, you may need: Home care, if symptoms are not very bad. Do not eat for 12-24 hours. Drink only water and clear liquids. Start to eat simple or clear foods after 1 or 2 days. Try broths and crackers. You may need medicines for pain or stomach upset or both. If you have an infection, you will need antibiotics. A hospital stay, if you have very bad pain or a very bad infection. Surgery to remove your gallbladder. You may need this if: Gallstones keep coming back. You have very bad symptoms. Medicines to break up gallstones. Medicines: Are best for small gallstones. May be used for up to 6-12 months. A procedure to find and take out gallstones or to break up gallstones. Follow these instructions at home: Medicines Take over-the-counter and prescription medicines only as told by your doctor. If you were prescribed an antibiotic medicine, take it as told by your doctor. Do not stop taking the antibiotic even if you start to feel better. Ask your doctor if the medicine prescribed to you requires you to avoid driving or using machinery. Eating and drinking Drink enough fluid to keep your urine pale yellow. Drink water or clear fluids. This is important when you have pain. Eat healthy foods. Choose: Fewer fatty foods, such as fried foods. Fewer refined carbs. Avoid breads and grains that are highly processed, such as white bread and white rice.  Choose whole grains, such as whole-wheat bread and brown rice. More fiber. Almonds, fresh fruit, and beans are healthy sources. General instructions Keep a healthy weight. Keep all follow-up visits as told by your doctor. This is important. Where to find more information National Institute of Diabetes and Digestive and Kidney Diseases: www.niddk.nih.gov Contact a doctor  if: You have sudden pain in the upper right part of your belly. Pain might spread to your right shoulder, back, or chest. You have been diagnosed with gallstones that have no symptoms and you get: Belly pain. Discomfort, burning, or fullness in the upper part of your abdomen. You have dark urine or pale stools. Get help right away if: You have sudden pain in the upper right part of your abdomen, and the pain lasts more than 2 hours. You have pain in your abdomen, and: It lasts more than 5 hours. It keeps getting worse. You have a fever or chills. You keep feeling like you may vomit. You keep vomiting. Your skin or the white parts of your eyes turn yellow. Summary Cholelithiasis happens when gallstones form in the gallbladder. This condition may be caused by a blood disease, too much of a fat-like substance in the bile, or not enough bile salts in bile. Treatment for this condition depends on how bad you feel. If you have symptoms, do not eat or drink. You may need medicines. You may need a hospital stay for very bad pain or a very bad infection. You may need surgery if gallstones keep coming back or if you have very bad symptoms. This information is not intended to replace advice given to you by your health care provider. Make sure you discuss any questions you have with your health care provider. Document Revised: 10/16/2019 Document Reviewed: 07/20/2019 Elsevier Patient Education  2023 [employer]  

## 2022-02-26 ENCOUNTER — Ambulatory Visit (INDEPENDENT_AMBULATORY_CARE_PROVIDER_SITE_OTHER): Payer: Medicare HMO | Admitting: Gastroenterology

## 2022-02-26 ENCOUNTER — Other Ambulatory Visit: Payer: Self-pay | Admitting: Gastroenterology

## 2022-02-26 VITALS — BP 137/77 | HR 64 | Temp 97.7°F | Ht 66.0 in | Wt 162.0 lb

## 2022-02-26 DIAGNOSIS — D649 Anemia, unspecified: Secondary | ICD-10-CM

## 2022-02-26 DIAGNOSIS — I4891 Unspecified atrial fibrillation: Secondary | ICD-10-CM | POA: Diagnosis not present

## 2022-02-26 NOTE — Progress Notes (Signed)
Primary Care Physician: Juline Patch, MD  Primary Gastroenterologist:  Dr. Lucilla Lame  Chief Complaint  Patient presents with   New Patient (Initial Visit)    HPI: Veronica Ellis is a 88 y.o. female here with a history of a recent admission for symptomatic anemia.  The patient had a colonoscopy and an upper endoscopy by Dr. Vicente Males and Dr. Marius Ditch and was reported to have a capsule endoscopy also.  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  She denies any nausea vomiting fevers chills black stools or bloody stools.  The patient had his appointment with me prior to being in the hospital so she came to establish care with me.  Past Medical History:  Diagnosis Date   Anxiety    Anxiety    Arthritis    Breast cancer ([place]) 2014   bilateral invasive mammary carcinoma. with 36 rad tx.    Breast cancer ([place])    Cancer ([place])    breast   Depression    Depression    Hard of hearing    Hyperlipemia    Hypertension    IBS (irritable bowel syndrome)    Memory loss    Osteoporosis    Personal history of radiation therapy    Wears dentures    full upper, partial lower    Current Outpatient Medications  Medication Sig Dispense Refill   atorvastatin (LIPITOR) 80 MG tablet Take 80 mg by mouth at bedtime.     buPROPion (WELLBUTRIN) 100 MG tablet Take 1 tablet (100 mg total) by mouth 2 (two) times daily. 180 tablet 1   ezetimibe (ZETIA) 10 MG tablet Take by mouth. Take 1 tablet (10 mg total) by mouth once daily     magnesium oxide (MAG-OX) 400 MG tablet Take 1 tablet by mouth daily.     metoprolol succinate (TOPROL-XL) 25 MG 24 hr tablet Take 0.5 tablets (12.5 mg total) by mouth daily.     nitroGLYCERIN (NITROSTAT) 0.4 MG SL tablet Place 0.4 mg under the tongue every 5 (five) minutes as needed for chest pain.     omeprazole (PRILOSEC) 40 MG capsule Take 1 capsule (40 mg total) by mouth 2 (two) times daily. 180 capsule 1   spironolactone  (ALDACTONE) 25 MG tablet Take 25 mg by mouth at bedtime.     torsemide (DEMADEX) 10 MG tablet Take by mouth. Take 1 tablet (10 mg total) by mouth once daily as needed (take if weight increases by 2lbs in 1 day or 5lbs in 1 week from 150lbs.)     No current facility-administered medications for this visit.    Allergies as of 02/26/2022 - Review Complete 02/19/2022  Allergen Reaction Noted   Iodine Nausea Only 02/16/2015   Erythromycin Rash 02/16/2015   Other Rash 02/16/2015   Penicillins Rash 02/16/2015   Shellfish allergy Rash 02/16/2015    ROS:  General: Negative for anorexia, weight loss, fever, chills, fatigue, weakness. ENT: Negative for hoarseness, difficulty swallowing , nasal congestion. CV: Negative for chest pain, angina, palpitations, dyspnea on exertion, peripheral edema.  Respiratory: Negative for dyspnea at rest, dyspnea on exertion, cough, sputum, wheezing.  GI: See history of present illness. GU:  Negative for dysuria, hematuria, urinary incontinence, urinary frequency, nocturnal urination.  Endo: Negative for unusual weight change.    Physical Examination:   There were no vitals taken for this visit.  General: Well-nourished, well-developed in no acute distress.  Eyes: No icterus.  Conjunctivae pink. Neuro: Alert and oriented x 3.  Grossly intact.  Except for hard of hearing. Skin: Warm and dry, no jaundice. Psych: Alert and cooperative, normal mood and affect.  Labs:    Imaging Studies: ECHOCARDIOGRAM LIMITED  Result Date: 02/12/2022    ECHOCARDIOGRAM LIMITED REPORT   Patient Name:   Veronica Ellis Date of Exam: 02/12/2022 Medical Rec #:  [id number]           Height:       66.0 in Accession #:    [id number]          Weight:       165.2 lb Date of Birth:  1934/08/12            BSA:          1.844 m Patient Age:    52 years            BP:           102/50 mmHg Patient Gender: F                   HR:           88 bpm. Exam Location:  [facility] Procedure: Limited Echo,  Color Doppler and Cardiac Doppler Indications:     Other Abnormalities of the heart 00.8  History:         Patient has prior history of Echocardiogram examinations, most                  recent 05/19/2018. Risk Factors:Hypertension and Dyslipidemia.  Sonographer:     Sherrie Sport Referring Phys:  [id number] Charlesetta Ivory GONFA Diagnosing Phys: Kathlyn Sacramento MD  Sonographer Comments: Suboptimal apical window and suboptimal subcostal window. IMPRESSIONS  1. Left ventricular ejection fraction, by estimation, is 55 to 60%. The left ventricle has normal function. The left ventricle has no regional wall motion abnormalities. There is moderate left ventricular hypertrophy. Left ventricular diastolic function  could not be evaluated.  2. Right ventricular systolic function is normal. The right ventricular size is normal. There is mildly elevated pulmonary artery systolic pressure.  3. The mitral valve is normal in structure. Trivial mitral valve regurgitation. No evidence of mitral stenosis. Moderate mitral annular calcification.  4. The aortic valve is calcified. Aortic valve regurgitation is not visualized. Aortic valve sclerosis/calcification is present, without any evidence of aortic stenosis.  5. The inferior vena cava is normal in size with greater than 50% respiratory variability, suggesting right atrial pressure of 3 mmHg. FINDINGS  Left Ventricle: Left ventricular ejection fraction, by estimation, is 55 to 60%. The left ventricle has normal function. The left ventricle has no regional wall motion abnormalities. The left ventricular internal cavity size was normal in size. There is  moderate left ventricular hypertrophy. Left ventricular diastolic function could not be evaluated. Right Ventricle: The right ventricular size is normal. No increase in right ventricular wall thickness. Right ventricular systolic function is normal. There is mildly elevated pulmonary artery systolic pressure. The tricuspid regurgitant velocity is  3.08  m/s, and with an assumed right atrial pressure of 3 mmHg, the estimated right ventricular systolic pressure is 46.5 mmHg. Left Atrium: Left atrial size was normal in size. Right Atrium: Right atrial size was normal in size. Pericardium: There is no evidence of pericardial effusion. Mitral Valve: The mitral valve is normal in structure. Moderate mitral annular calcification. Trivial mitral valve regurgitation. No evidence of mitral valve stenosis. Tricuspid Valve: The tricuspid valve is normal  in structure. Tricuspid valve regurgitation is mild . No evidence of tricuspid stenosis. Aortic Valve: The aortic valve is calcified. Aortic valve regurgitation is not visualized. Aortic valve sclerosis/calcification is present, without any evidence of aortic stenosis. Pulmonic Valve: The pulmonic valve was normal in structure. Pulmonic valve regurgitation is mild. No evidence of pulmonic stenosis. Aorta: The aortic root is normal in size and structure. Venous: The inferior vena cava is normal in size with greater than 50% respiratory variability, suggesting right atrial pressure of 3 mmHg. IAS/Shunts: No atrial level shunt detected by color flow Doppler. Additional Comments: There is a small pleural effusion in the left lateral region. LEFT VENTRICLE PLAX 2D LVIDd:         4.10 cm LVIDs:         2.90 cm LV PW:         1.40 cm LV IVS:        1.10 cm LVOT diam:     2.00 cm LVOT Area:     3.14 cm  RIGHT VENTRICLE RV Basal diam:  2.80 cm RV S prime:     13.20 cm/s TAPSE (M-mode): 2.1 cm LEFT ATRIUM         Index LA diam:    3.60 cm 1.95 cm/m                        PULMONIC VALVE AORTA                 PV Vmax:          0.83 m/s Ao Root diam: 3.20 cm PV Vmean:         52.500 cm/s                       PV VTI:           0.152 m                       PV Peak grad:     2.8 mmHg                       PV Mean grad:     1.0 mmHg                       PR End Diast Vel: 4.49 msec                       RVOT Peak grad:   4 mmHg   TRICUSPID VALVE TR Peak grad:   37.9 mmHg TR Vmax:        308.00 cm/s  SHUNTS Systemic Diam: 2.00 cm Pulmonic VTI:  0.180 m Kathlyn Sacramento MD Electronically signed by Kathlyn Sacramento MD Signature Date/Time: 02/12/2022/9:03:54 AM    Final    DG Chest 2 View  Result Date: 02/08/2022 CLINICAL DATA:  Dizziness, constipation common black stools. EXAM: CHEST - 2 VIEW COMPARISON:  Chest radiograph August 21, 2018 FINDINGS: The heart size and mediastinal contours are within normal limits. Aortic atherosclerosis. No focal airspace consolidation. No pleural effusion. No pneumothorax. No acute osseous abnormality. IMPRESSION: 1. No acute cardiopulmonary process. 2.  Aortic Atherosclerosis (ICD10-I70.0). Electronically Signed   By: Dahlia Bailiff M.D.   On: 02/08/2022 17:53    Assessment and Plan:   Gay Moncivais is a 88 y.o. y/o female who comes for establishment of care  after having EGD colonoscopy and Endoscopy for anemia without any source of bleeding found.  The recommendation at this point would be to monitor the hemoglobin and if continues to be low then provide iron supplementation.  The patient will have her hemoglobin and hematocrit checked today.  The patient and her son have been explained the plan and agree with it.     Lucilla Lame, MD. Marval Regal    Note: This dictation was prepared with Dragon dictation along with smaller phrase technology. Any transcriptional errors that result from this process are unintentional.

## 2022-02-26 NOTE — Addendum Note (Signed)
Addended by: Lurlean Nanny on: 02/26/2022 02:02 PM   Modules accepted: Orders

## 2022-02-27 LAB — CBC
Hematocrit: 35.6 % (ref 34.0–46.6)
Hemoglobin: 11.7 g/dL (ref 11.1–15.9)
MCH: 30.3 pg (ref 26.6–33.0)
MCHC: 32.9 g/dL (ref 31.5–35.7)
MCV: 92 fL (ref 79–97)
Platelets: 348 10*3/uL (ref 150–450)
RBC: 3.86 x10E6/uL (ref 3.77–5.28)
RDW: 13.8 % (ref 11.7–15.4)
WBC: 5.6 10*3/uL (ref 3.4–10.8)

## 2022-03-01 ENCOUNTER — Telehealth: Payer: Self-pay

## 2022-03-01 ENCOUNTER — Telehealth: Payer: Self-pay | Admitting: Family Medicine

## 2022-03-01 NOTE — Telephone Encounter (Signed)
Called Ivin Booty with [facility] and left a message stating if pt is having blood in stool or black stools- go to ER

## 2022-03-01 NOTE — Telephone Encounter (Signed)
Copied from [place] [id number]. Topic: General - Other >> Mar 01, 2022 12:18 PM [place] B wrote: Reason for GZF:[id number] called in says has no blood or dark stool.

## 2022-03-01 NOTE — Telephone Encounter (Signed)
Copied from [place] [id number]. Topic: General - Other >> Mar 01, 2022 11:55 AM Cyndi Bender wrote: Reason for CRM: Ivin Booty with [facility] reports that patient complained of blood in stool but neither Ivin Booty or patient's son saw the blood. Ivin Booty stated it is [facility]'s policy to report this to the pcp. Cb# [phone]

## 2022-03-06 DIAGNOSIS — I5023 Acute on chronic systolic (congestive) heart failure: Secondary | ICD-10-CM | POA: Diagnosis not present

## 2022-03-06 DIAGNOSIS — I10 Essential (primary) hypertension: Secondary | ICD-10-CM | POA: Diagnosis not present

## 2022-03-06 DIAGNOSIS — I480 Paroxysmal atrial fibrillation: Secondary | ICD-10-CM | POA: Diagnosis not present

## 2022-03-06 DIAGNOSIS — I2510 Atherosclerotic heart disease of native coronary artery without angina pectoris: Secondary | ICD-10-CM | POA: Diagnosis not present

## 2022-04-20 ENCOUNTER — Emergency Department: Payer: Medicare HMO

## 2022-04-20 ENCOUNTER — Inpatient Hospital Stay
Admission: EM | Admit: 2022-04-20 | Discharge: 2022-04-22 | DRG: 690 | Disposition: A | Discharge status: Home / Self Care | Payer: Medicare HMO | Attending: Family Medicine | Admitting: Family Medicine

## 2022-04-20 ENCOUNTER — Ambulatory Visit: Payer: Self-pay | Admitting: *Deleted

## 2022-04-20 ENCOUNTER — Other Ambulatory Visit: Payer: Self-pay

## 2022-04-20 DIAGNOSIS — T45515A Adverse effect of anticoagulants, initial encounter: Secondary | ICD-10-CM | POA: Diagnosis not present

## 2022-04-20 DIAGNOSIS — M810 Age-related osteoporosis without current pathological fracture: Secondary | ICD-10-CM | POA: Diagnosis present

## 2022-04-20 DIAGNOSIS — N1831 Chronic kidney disease, stage 3a: Secondary | ICD-10-CM | POA: Diagnosis present

## 2022-04-20 DIAGNOSIS — Z888 Allergy status to other drugs, medicaments and biological substances status: Secondary | ICD-10-CM

## 2022-04-20 DIAGNOSIS — N3001 Acute cystitis with hematuria: Secondary | ICD-10-CM | POA: Diagnosis not present

## 2022-04-20 DIAGNOSIS — I679 Cerebrovascular disease, unspecified: Secondary | ICD-10-CM | POA: Diagnosis present

## 2022-04-20 DIAGNOSIS — K922 Gastrointestinal hemorrhage, unspecified: Secondary | ICD-10-CM | POA: Diagnosis not present

## 2022-04-20 DIAGNOSIS — I10 Essential (primary) hypertension: Secondary | ICD-10-CM | POA: Diagnosis not present

## 2022-04-20 DIAGNOSIS — Z803 Family history of malignant neoplasm of breast: Secondary | ICD-10-CM | POA: Diagnosis not present

## 2022-04-20 DIAGNOSIS — N3289 Other specified disorders of bladder: Secondary | ICD-10-CM | POA: Diagnosis not present

## 2022-04-20 DIAGNOSIS — I5022 Chronic systolic (congestive) heart failure: Secondary | ICD-10-CM | POA: Diagnosis present

## 2022-04-20 DIAGNOSIS — Z79899 Other long term (current) drug therapy: Secondary | ICD-10-CM

## 2022-04-20 DIAGNOSIS — K769 Liver disease, unspecified: Secondary | ICD-10-CM | POA: Diagnosis not present

## 2022-04-20 DIAGNOSIS — F32A Depression, unspecified: Secondary | ICD-10-CM | POA: Diagnosis present

## 2022-04-20 DIAGNOSIS — Z7901 Long term (current) use of anticoagulants: Secondary | ICD-10-CM

## 2022-04-20 DIAGNOSIS — Z8673 Personal history of transient ischemic attack (TIA), and cerebral infarction without residual deficits: Secondary | ICD-10-CM

## 2022-04-20 DIAGNOSIS — I447 Left bundle-branch block, unspecified: Secondary | ICD-10-CM | POA: Diagnosis present

## 2022-04-20 DIAGNOSIS — Z881 Allergy status to other antibiotic agents status: Secondary | ICD-10-CM | POA: Diagnosis not present

## 2022-04-20 DIAGNOSIS — Z882 Allergy status to sulfonamides status: Secondary | ICD-10-CM | POA: Diagnosis not present

## 2022-04-20 DIAGNOSIS — I130 Hypertensive heart and chronic kidney disease with heart failure and stage 1 through stage 4 chronic kidney disease, or unspecified chronic kidney disease: Secondary | ICD-10-CM | POA: Diagnosis not present

## 2022-04-20 DIAGNOSIS — Z91013 Allergy to seafood: Secondary | ICD-10-CM

## 2022-04-20 DIAGNOSIS — Z853 Personal history of malignant neoplasm of breast: Secondary | ICD-10-CM

## 2022-04-20 DIAGNOSIS — D6832 Hemorrhagic disorder due to extrinsic circulating anticoagulants: Secondary | ICD-10-CM | POA: Diagnosis not present

## 2022-04-20 DIAGNOSIS — Z880 Allergy status to penicillin: Secondary | ICD-10-CM | POA: Diagnosis not present

## 2022-04-20 DIAGNOSIS — E785 Hyperlipidemia, unspecified: Secondary | ICD-10-CM | POA: Diagnosis present

## 2022-04-20 DIAGNOSIS — J439 Emphysema, unspecified: Secondary | ICD-10-CM | POA: Diagnosis present

## 2022-04-20 DIAGNOSIS — R310 Gross hematuria: Secondary | ICD-10-CM

## 2022-04-20 DIAGNOSIS — D689 Coagulation defect, unspecified: Secondary | ICD-10-CM | POA: Diagnosis not present

## 2022-04-20 DIAGNOSIS — I480 Paroxysmal atrial fibrillation: Secondary | ICD-10-CM | POA: Diagnosis present

## 2022-04-20 DIAGNOSIS — R791 Abnormal coagulation profile: Secondary | ICD-10-CM

## 2022-04-20 DIAGNOSIS — Z923 Personal history of irradiation: Secondary | ICD-10-CM

## 2022-04-20 DIAGNOSIS — F419 Anxiety disorder, unspecified: Secondary | ICD-10-CM | POA: Diagnosis present

## 2022-04-20 DIAGNOSIS — D631 Anemia in chronic kidney disease: Secondary | ICD-10-CM | POA: Diagnosis present

## 2022-04-20 DIAGNOSIS — I4891 Unspecified atrial fibrillation: Secondary | ICD-10-CM | POA: Diagnosis not present

## 2022-04-20 DIAGNOSIS — J432 Centrilobular emphysema: Secondary | ICD-10-CM | POA: Diagnosis present

## 2022-04-20 DIAGNOSIS — Z87891 Personal history of nicotine dependence: Secondary | ICD-10-CM | POA: Diagnosis not present

## 2022-04-20 DIAGNOSIS — K8020 Calculus of gallbladder without cholecystitis without obstruction: Secondary | ICD-10-CM | POA: Diagnosis not present

## 2022-04-20 LAB — CBC
HCT: 34.5 % — ABNORMAL LOW (ref 36.0–46.0)
Hemoglobin: 10.7 g/dL — ABNORMAL LOW (ref 12.0–15.0)
MCH: 28.7 pg (ref 26.0–34.0)
MCHC: 31 g/dL (ref 30.0–36.0)
MCV: 92.5 fL (ref 80.0–100.0)
Platelets: 325 10*3/uL (ref 150–400)
RBC: 3.73 MIL/uL — ABNORMAL LOW (ref 3.87–5.11)
RDW: 14.7 % (ref 11.5–15.5)
WBC: 7.3 10*3/uL (ref 4.0–10.5)
nRBC: 0 % (ref 0.0–0.2)

## 2022-04-20 LAB — COMPREHENSIVE METABOLIC PANEL
ALT: 28 U/L (ref 0–44)
AST: 21 U/L (ref 15–41)
Albumin: 3.4 g/dL — ABNORMAL LOW (ref 3.5–5.0)
Alkaline Phosphatase: 73 U/L (ref 38–126)
Anion gap: 5 (ref 5–15)
BUN: 22 mg/dL (ref 8–23)
CO2: 25 mmol/L (ref 22–32)
Calcium: 8.7 mg/dL — ABNORMAL LOW (ref 8.9–10.3)
Chloride: 109 mmol/L (ref 98–111)
Creatinine, Ser: 1.44 mg/dL — ABNORMAL HIGH (ref 0.44–1.00)
GFR, Estimated: 35 mL/min — ABNORMAL LOW (ref >60)
Glucose, Bld: 98 mg/dL (ref 70–99)
Potassium: 4.1 mmol/L (ref 3.5–5.1)
Sodium: 139 mmol/L (ref 135–145)
Total Bilirubin: 0.6 mg/dL (ref 0.3–1.2)
Total Protein: 6.6 g/dL (ref 6.5–8.1)

## 2022-04-20 LAB — URINALYSIS, ROUTINE W REFLEX MICROSCOPIC
RBC / HPF: >50 RBC/hpf — ABNORMAL HIGH (ref 0–5)
Specific Gravity, Urine: 1.01 (ref 1.005–1.030)
Squamous Epithelial / HPF: NONE SEEN (ref 0–5)
WBC, UA: >50 WBC/hpf — ABNORMAL HIGH (ref 0–5)

## 2022-04-20 LAB — PROTIME-INR
INR: 7.3 (ref 0.8–1.2)
Prothrombin Time: 61.6 seconds — ABNORMAL HIGH (ref 11.4–15.2)

## 2022-04-20 MED ORDER — PHYTONADIONE 5 MG PO TABS
2.5000 mg | ORAL_TABLET | Freq: Once | ORAL | Status: AC
  Administered 2022-04-20: 2.5 mg via ORAL
  Filled 2022-04-20: qty 1

## 2022-04-20 MED ORDER — SODIUM CHLORIDE 0.9 % IV SOLN
1.0000 g | Freq: Once | INTRAVENOUS | Status: AC
  Administered 2022-04-20: 1 g via INTRAVENOUS
  Filled 2022-04-20: qty 10

## 2022-04-20 NOTE — ED Notes (Signed)
Pt's son told this nurse that pt had black stool while in the [place]. Provider notified, no new orders

## 2022-04-20 NOTE — ED Provider Notes (Signed)
[facility] Provider Note    Event Date/Time   First MD Initiated Contact with Patient 04/20/22 2124     (approximate)   History   Hematuria   HPI  Veronica Ellis is a 88 y.o. female presents to the ER for evaluation of gross hematuria.  Reportedly also had some dark bowel movements.  Has been extensively worked up for this in the past.  Has had some burning discomfort with urination.  No fainting spells no syncope.  She is on Coumadin for history of A-fib.     Physical Exam   Triage Vital Signs: ED Triage Vitals  Enc Vitals Group     BP 04/20/22 1508 (!) 166/64     Pulse Rate 04/20/22 1508 72     Resp 04/20/22 1508 18     Temp 04/20/22 1508 98.6 F (37 C)     Temp Source 04/20/22 1508 Oral     SpO2 04/20/22 1508 99 %     Weight 04/20/22 1509 161 lb (73 kg)     Height --      Head Circumference --      Peak Flow --      Pain Score 04/20/22 1509 9     Pain Loc --      Pain Edu? --      Excl. in [place]? --     Most recent vital signs: Vitals:   04/20/22 2200 04/20/22 2226  BP: (!) 140/53 (!) 140/53  Pulse: 65 65  Resp:  18  Temp:  97.9 F (36.6 C)  SpO2: 98% 97%     Constitutional: Alert  Eyes: Conjunctivae are normal.  Head: Atraumatic. Nose: No congestion/rhinnorhea. Mouth/Throat: Mucous membranes are moist.   Neck: Painless ROM.  Cardiovascular:   Good peripheral circulation. Respiratory: Normal respiratory effort.  No retractions.  Gastrointestinal: Soft and nontender.  No evidence of active bleeding.  Brown stool on DRE.  Is guaiac positive.  No evidence of vaginal bleeding. Musculoskeletal:  no deformity Neurologic:  MAE spontaneously. No gross focal neurologic deficits are appreciated.  Skin:  Skin is warm, dry and intact. No rash noted. Psychiatric: Mood and affect are normal. Speech and behavior are normal.    ED Results / Procedures / Treatments   Labs (all labs ordered are listed, but only abnormal results are  displayed) Labs Reviewed  COMPREHENSIVE METABOLIC PANEL - Abnormal; Notable for the following components:      Result Value   Creatinine, Ser 1.44 (*)    Calcium 8.7 (*)    Albumin 3.4 (*)    GFR, Estimated 35 (*)    All other components within normal limits  CBC - Abnormal; Notable for the following components:   RBC 3.73 (*)    Hemoglobin 10.7 (*)    HCT 34.5 (*)    All other components within normal limits  URINALYSIS, ROUTINE W REFLEX MICROSCOPIC - Abnormal; Notable for the following components:   Color, Urine RED (*)    APPearance TURBID (*)    Glucose, UA   (*)    Value: TEST NOT REPORTED DUE TO COLOR INTERFERENCE OF URINE PIGMENT   Hgb urine dipstick   (*)    Value: TEST NOT REPORTED DUE TO COLOR INTERFERENCE OF URINE PIGMENT   Bilirubin Urine   (*)    Value: TEST NOT REPORTED DUE TO COLOR INTERFERENCE OF URINE PIGMENT   Ketones, ur   (*)    Value: TEST NOT REPORTED DUE TO  COLOR INTERFERENCE OF URINE PIGMENT   Protein, ur   (*)    Value: TEST NOT REPORTED DUE TO COLOR INTERFERENCE OF URINE PIGMENT   Nitrite   (*)    Value: TEST NOT REPORTED DUE TO COLOR INTERFERENCE OF URINE PIGMENT   Leukocytes,Ua   (*)    Value: TEST NOT REPORTED DUE TO COLOR INTERFERENCE OF URINE PIGMENT   RBC / HPF >50 (*)    WBC, UA >50 (*)    Bacteria, UA MANY (*)    Non Squamous Epithelial PRESENT (*)    All other components within normal limits  PROTIME-INR - Abnormal; Notable for the following components:   Prothrombin Time 61.6 (*)    INR 7.3 (*)    All other components within normal limits  POC OCCULT BLOOD, ED  TYPE AND SCREEN     EKG     RADIOLOGY Please see ED Course for my review and interpretation.  I personally reviewed all radiographic images ordered to evaluate for the above acute complaints and reviewed radiology reports and findings.  These findings were personally discussed with the patient.  Please see medical record for radiology  report.    PROCEDURES:  Critical Care performed: No  Procedures   MEDICATIONS ORDERED IN ED: Medications  cefTRIAXone (ROCEPHIN) 1 g in sodium chloride 0.9 % 100 mL IVPB (1 g Intravenous New Bag/Given 04/20/22 2319)  phytonadione (VITAMIN K) tablet 2.5 mg (has no administration in time range)     IMPRESSION / MDM / ASSESSMENT AND PLAN / ED COURSE  I reviewed the triage vital signs and the nursing notes.                              Differential diagnosis includes, but is not limited to, cystitis, stone, hemorrhoid, mass, diverticular bleed, supratherapeutic INR  Patient presented to the ER for evaluation symptoms as described above.  This presenting complaint could reflect a potentially life-threatening illness therefore the patient will be placed on continuous pulse oximetry and telemetry for monitoring.  Laboratory evaluation will be sent to evaluate for the above complaints.  Patient clinically well-appearing hemodynamically stable.  Witching will be ordered for the blood differential.   Clinical Course as of 04/20/22 2326  Fri Apr 20, 2022  2245 CT imaging on my review of interpretation shows evidence of probable ureteral stone.  Will await formal radiology report. [PR]  2325 Patient does have supratherapeutic INR.  Urinalysis consistent with acute cystitis.  Will give Rocephin.  Her hemoglobin has slightly dropped as compared to previous.  She is hemodynamically stable minimally symptomatic not having signs of active hemorrhage at this time therefore I think it is appropriate to hold off on Kcentra and give vitamin K at this time.  Do feel she will require observation in the hospital.  [facility] hospitalist for admission. [PR]    Clinical Course User Index [PR] Merlyn Lot, MD      FINAL CLINICAL IMPRESSION(S) / ED DIAGNOSES   Final diagnoses:  Acute cystitis with hematuria  Supratherapeutic INR     Rx / DC Orders   ED Discharge Orders     None         Note:  This document was prepared using Dragon voice recognition software and may include unintentional dictation errors.    Merlyn Lot, MD 04/20/22 2326

## 2022-04-20 NOTE — ED Provider Triage Note (Signed)
Emergency Medicine Provider Triage Evaluation Note  Veronica Ellis , a 89 y.o. female  was evaluated in triage.  Pt complains of hematuria. Bright red blood in toilet this morning. History of rectal bleeding requiring transfusion.   Physical Exam  BP (!) 166/64 (BP Location: Left Arm)   Pulse 72   Temp 98.6 F (37 C) (Oral)   Resp 18   Wt 73 kg   SpO2 99%   BMI 25.99 kg/m  Gen:   Awake, no distress   Resp:  Normal effort  MSK:   Moves extremities without difficulty  Other:    Medical Decision Making  Medically screening exam initiated at 3:22 PM.  Appropriate orders placed.  Veronica Ellis was informed that the remainder of the evaluation will be completed by another provider, this initial triage assessment does not replace that evaluation, and the importance of remaining in the ED until their evaluation is complete.    Victorino Dike, FNP 04/20/22 [phone]

## 2022-04-20 NOTE — ED Triage Notes (Signed)
Pt states that she was peeing blood- pt was admitted recently for rectal bleeding and was told to call Dr Verl Blalock if she had more bleeding- pt states that the toilet was full of bright red blood today and she could not get in touch with Dr Winnifred Friar office

## 2022-04-20 NOTE — Telephone Encounter (Signed)
  Chief Complaint: Bleeding Symptoms: Notes blood on tissue when wiping. Unsure if vaginal, in urine or stool. "I do them all at same time." Abdominal pain, upper right abd. Pt reluctant to triage further. On blood thinners. Frequency: last week Pertinent Negatives: Patient denies  Disposition: '[]'$ ED /'[]'$ Urgent Care (no appt availability in office) / '[]'$ Appointment(In office/virtual)/ '[]'$  [facility]/ '[]'$ Home Care/ '[x]'$ Refused Recommended Disposition /'[]'$ [facility]/ '[]'$  Follow-up with PCP Additional Notes: Pt calling for "Doctor who saw me in hospital, said to call if I start bleeding again." Does not want to continue triage, assessment. Requesting to speak to Veronica Ellis. Called practice, [name]. Per Veronica Ellis pt should be seen in ED or UC. Advised pt. Again states wants number to MD mentioned, expresses frustration. Did find MD she was referring to, Dr. Allen Norris and number provided. Did reiterate advised UC or ED. Pt stated "OK" and hung up Reason for Disposition  Taking Coumadin (warfarin) or other strong blood thinner, or known bleeding disorder (e.g., thrombocytopenia)  Answer Assessment - Initial Assessment Questions 1. AMOUNT: "Describe the bleeding that you are having." "How much bleeding is there?"    - SPOTTING: spotting, or pinkish / brownish mucous discharge; does not fill panty liner or pad    - MILD:  less than 1 pad / hour; less than patient's usual menstrual bleeding   - MODERATE: 1-2 pads / hour; 1 menstrual cup every 6 hours; small-medium blood clots (e.g., pea, grape, small coin)   - SEVERE: soaking 2 or more pads/hour for 2 or more hours; 1 menstrual cup every 2 hours; bleeding not contained by pads or continuous red blood from vagina; large blood clots (e.g., golf ball, large coin)      Not sure if from vagina or urine. On paper with wiping. Mixed in stool as well 2. ONSET: "When did the bleeding begin?" "Is it continuing now?"     Last week 3. MENOPAUSE: "When was your last  menstrual period?"       4. ABDOMEN PAIN: "Do you have any pain?" "How bad is the pain?"  (e.g., Scale 1-10; mild, moderate, or severe)   - MILD (1-3): doesn't interfere with normal activities, abdomen soft and not tender to touch    - MODERATE (4-7): interferes with normal activities or awakens from sleep, abdomen tender to touch    - SEVERE (8-10): excruciating pain, doubled over, unable to do any normal activities      Abdominal pain, right upper abd.  5. BLOOD THINNERS: "Do you take any blood thinners?" (e.g., Coumadin/warfarin, Pradaxa/dabigatran, aspirin)     *No Answer* 6. HORMONE MEDICINES: "Are you taking any hormone medicines, prescription or OTC?" (e.g., birth control pills, estrogen)     *No Answer* 7. CAUSE: "What do you think is causing the bleeding?" (e.g., recent gyn surgery, recent gyn procedure; known bleeding disorder, uterine cancer)       *No Answer* 8. HEMODYNAMIC STATUS: "Are you weak or feeling lightheaded?" If Yes, ask: "Can you stand and walk normally?"       *No Answer* 9. OTHER SYMPTOMS: "What other symptoms are you having with the bleeding?" (e.g., back pain, burning with urination, fever)     *No Answer*  Protocols used: Vaginal Bleeding - Postmenopausal-A-AH

## 2022-04-21 DIAGNOSIS — F419 Anxiety disorder, unspecified: Secondary | ICD-10-CM | POA: Diagnosis present

## 2022-04-21 DIAGNOSIS — I447 Left bundle-branch block, unspecified: Secondary | ICD-10-CM | POA: Diagnosis present

## 2022-04-21 DIAGNOSIS — D689 Coagulation defect, unspecified: Secondary | ICD-10-CM | POA: Diagnosis not present

## 2022-04-21 DIAGNOSIS — I480 Paroxysmal atrial fibrillation: Secondary | ICD-10-CM | POA: Diagnosis present

## 2022-04-21 DIAGNOSIS — M810 Age-related osteoporosis without current pathological fracture: Secondary | ICD-10-CM | POA: Diagnosis present

## 2022-04-21 DIAGNOSIS — N3001 Acute cystitis with hematuria: Secondary | ICD-10-CM | POA: Diagnosis present

## 2022-04-21 DIAGNOSIS — Z803 Family history of malignant neoplasm of breast: Secondary | ICD-10-CM | POA: Diagnosis not present

## 2022-04-21 DIAGNOSIS — N1831 Chronic kidney disease, stage 3a: Secondary | ICD-10-CM | POA: Diagnosis present

## 2022-04-21 DIAGNOSIS — R310 Gross hematuria: Secondary | ICD-10-CM | POA: Diagnosis not present

## 2022-04-21 DIAGNOSIS — Z888 Allergy status to other drugs, medicaments and biological substances status: Secondary | ICD-10-CM | POA: Diagnosis not present

## 2022-04-21 DIAGNOSIS — I10 Essential (primary) hypertension: Secondary | ICD-10-CM

## 2022-04-21 DIAGNOSIS — I4891 Unspecified atrial fibrillation: Secondary | ICD-10-CM | POA: Diagnosis not present

## 2022-04-21 DIAGNOSIS — K922 Gastrointestinal hemorrhage, unspecified: Secondary | ICD-10-CM | POA: Diagnosis present

## 2022-04-21 DIAGNOSIS — Z853 Personal history of malignant neoplasm of breast: Secondary | ICD-10-CM | POA: Diagnosis not present

## 2022-04-21 DIAGNOSIS — D6832 Hemorrhagic disorder due to extrinsic circulating anticoagulants: Secondary | ICD-10-CM | POA: Diagnosis present

## 2022-04-21 DIAGNOSIS — Z881 Allergy status to other antibiotic agents status: Secondary | ICD-10-CM | POA: Diagnosis not present

## 2022-04-21 DIAGNOSIS — Z882 Allergy status to sulfonamides status: Secondary | ICD-10-CM | POA: Diagnosis not present

## 2022-04-21 DIAGNOSIS — Z87891 Personal history of nicotine dependence: Secondary | ICD-10-CM | POA: Diagnosis not present

## 2022-04-21 DIAGNOSIS — Z923 Personal history of irradiation: Secondary | ICD-10-CM | POA: Diagnosis not present

## 2022-04-21 DIAGNOSIS — R791 Abnormal coagulation profile: Secondary | ICD-10-CM

## 2022-04-21 DIAGNOSIS — Z8673 Personal history of transient ischemic attack (TIA), and cerebral infarction without residual deficits: Secondary | ICD-10-CM | POA: Diagnosis not present

## 2022-04-21 DIAGNOSIS — J439 Emphysema, unspecified: Secondary | ICD-10-CM | POA: Diagnosis present

## 2022-04-21 DIAGNOSIS — Z91013 Allergy to seafood: Secondary | ICD-10-CM | POA: Diagnosis not present

## 2022-04-21 DIAGNOSIS — F32A Depression, unspecified: Secondary | ICD-10-CM | POA: Diagnosis present

## 2022-04-21 DIAGNOSIS — D631 Anemia in chronic kidney disease: Secondary | ICD-10-CM | POA: Diagnosis present

## 2022-04-21 DIAGNOSIS — E785 Hyperlipidemia, unspecified: Secondary | ICD-10-CM | POA: Diagnosis present

## 2022-04-21 DIAGNOSIS — Z880 Allergy status to penicillin: Secondary | ICD-10-CM | POA: Diagnosis not present

## 2022-04-21 DIAGNOSIS — I5022 Chronic systolic (congestive) heart failure: Secondary | ICD-10-CM | POA: Diagnosis present

## 2022-04-21 DIAGNOSIS — I130 Hypertensive heart and chronic kidney disease with heart failure and stage 1 through stage 4 chronic kidney disease, or unspecified chronic kidney disease: Secondary | ICD-10-CM | POA: Diagnosis present

## 2022-04-21 DIAGNOSIS — T45515A Adverse effect of anticoagulants, initial encounter: Secondary | ICD-10-CM | POA: Diagnosis present

## 2022-04-21 LAB — IRON AND TIBC
Iron: 29 ug/dL (ref 28–170)
Saturation Ratios: 11 % (ref 10.4–31.8)
TIBC: 256 ug/dL (ref 250–450)
UIBC: 227 ug/dL

## 2022-04-21 LAB — CBC
HCT: 29.8 % — ABNORMAL LOW (ref 36.0–46.0)
Hemoglobin: 9.3 g/dL — ABNORMAL LOW (ref 12.0–15.0)
MCH: 28.2 pg (ref 26.0–34.0)
MCHC: 31.2 g/dL (ref 30.0–36.0)
MCV: 90.3 fL (ref 80.0–100.0)
Platelets: 275 10*3/uL (ref 150–400)
RBC: 3.3 MIL/uL — ABNORMAL LOW (ref 3.87–5.11)
RDW: 14.5 % (ref 11.5–15.5)
WBC: 8.4 10*3/uL (ref 4.0–10.5)
nRBC: 0 % (ref 0.0–0.2)

## 2022-04-21 LAB — COMPREHENSIVE METABOLIC PANEL
ALT: 28 U/L (ref 0–44)
AST: 23 U/L (ref 15–41)
Albumin: 3 g/dL — ABNORMAL LOW (ref 3.5–5.0)
Alkaline Phosphatase: 59 U/L (ref 38–126)
Anion gap: 5 (ref 5–15)
BUN: 18 mg/dL (ref 8–23)
CO2: 25 mmol/L (ref 22–32)
Calcium: 8.3 mg/dL — ABNORMAL LOW (ref 8.9–10.3)
Chloride: 109 mmol/L (ref 98–111)
Creatinine, Ser: 1.37 mg/dL — ABNORMAL HIGH (ref 0.44–1.00)
GFR, Estimated: 38 mL/min — ABNORMAL LOW (ref >60)
Glucose, Bld: 96 mg/dL (ref 70–99)
Potassium: 3.9 mmol/L (ref 3.5–5.1)
Sodium: 139 mmol/L (ref 135–145)
Total Bilirubin: 0.5 mg/dL (ref 0.3–1.2)
Total Protein: 5.9 g/dL — ABNORMAL LOW (ref 6.5–8.1)

## 2022-04-21 LAB — TYPE AND SCREEN
ABO/RH(D): O POS
Antibody Screen: NEGATIVE

## 2022-04-21 LAB — PROTIME-INR
INR: 8.2 (ref 0.8–1.2)
Prothrombin Time: 67.8 seconds — ABNORMAL HIGH (ref 11.4–15.2)

## 2022-04-21 LAB — FOLATE: Folate: 21.5 ng/mL (ref >5.9)

## 2022-04-21 LAB — FERRITIN: Ferritin: 63 ng/mL (ref 11–307)

## 2022-04-21 LAB — HEMOGLOBIN AND HEMATOCRIT, BLOOD
HCT: 29.7 % — ABNORMAL LOW (ref 36.0–46.0)
Hemoglobin: 9.6 g/dL — ABNORMAL LOW (ref 12.0–15.0)

## 2022-04-21 MED ORDER — ATORVASTATIN CALCIUM 20 MG PO TABS
80.0000 mg | ORAL_TABLET | Freq: Every day | ORAL | Status: DC
Start: 2022-04-21 — End: 2022-04-21

## 2022-04-21 MED ORDER — HYDROCODONE-ACETAMINOPHEN 5-325 MG PO TABS
1.0000 | ORAL_TABLET | ORAL | Status: DC | PRN
  Administered 2022-04-21: 1 via ORAL
  Filled 2022-04-21: qty 1

## 2022-04-21 MED ORDER — PANTOPRAZOLE SODIUM 40 MG IV SOLR: 40.0000 mg | Freq: Two times a day (BID) | INTRAVENOUS | Status: DC

## 2022-04-21 MED ORDER — PANTOPRAZOLE SODIUM 40 MG IV SOLR
40.0000 mg | Freq: Two times a day (BID) | INTRAVENOUS | Status: DC
  Administered 2022-04-21 – 2022-04-22 (×2): 40 mg via INTRAVENOUS
  Filled 2022-04-21 (×2): qty 10

## 2022-04-21 MED ORDER — BISACODYL 5 MG PO TBEC: 5.0000 mg | DELAYED_RELEASE_TABLET | Freq: Every day | ORAL | Status: DC | PRN

## 2022-04-21 MED ORDER — ONDANSETRON HCL 4 MG PO TABS: 4.0000 mg | ORAL_TABLET | Freq: Four times a day (QID) | ORAL | Status: DC | PRN

## 2022-04-21 MED ORDER — ATORVASTATIN CALCIUM 20 MG PO TABS
80.0000 mg | ORAL_TABLET | Freq: Every day | ORAL | Status: DC
  Administered 2022-04-21: 80 mg via ORAL
  Filled 2022-04-21: qty 4

## 2022-04-21 MED ORDER — SENNOSIDES-DOCUSATE SODIUM 8.6-50 MG PO TABS: 1.0000 | ORAL_TABLET | Freq: Every evening | ORAL | Status: DC | PRN

## 2022-04-21 MED ORDER — SODIUM CHLORIDE 0.9 % IV SOLN: 1.0000 g | INTRAVENOUS | Status: DC

## 2022-04-21 MED ORDER — TRAZODONE HCL 50 MG PO TABS
25.0000 mg | ORAL_TABLET | Freq: Every evening | ORAL | Status: DC | PRN
  Administered 2022-04-21: 25 mg via ORAL
  Filled 2022-04-21: qty 1

## 2022-04-21 MED ORDER — VITAMIN K1 10 MG/ML IJ SOLN
10.0000 mg | Freq: Once | INTRAVENOUS | Status: AC
  Administered 2022-04-21: 10 mg via INTRAVENOUS
  Filled 2022-04-21: qty 1

## 2022-04-21 MED ORDER — SACUBITRIL-VALSARTAN 24-26 MG PO TABS
1.0000 | ORAL_TABLET | Freq: Two times a day (BID) | ORAL | Status: DC
  Administered 2022-04-21 – 2022-04-22 (×2): 1 via ORAL
  Filled 2022-04-21 (×4): qty 1

## 2022-04-21 MED ORDER — SODIUM CHLORIDE 0.9 % IV SOLN
1.0000 g | INTRAVENOUS | Status: DC
  Administered 2022-04-21: 1 g via INTRAVENOUS
  Filled 2022-04-21: qty 10

## 2022-04-21 MED ORDER — EZETIMIBE 10 MG PO TABS
10.0000 mg | ORAL_TABLET | Freq: Every day | ORAL | Status: DC
  Administered 2022-04-21 – 2022-04-22 (×2): 10 mg via ORAL
  Filled 2022-04-21 (×2): qty 1

## 2022-04-21 MED ORDER — PANTOPRAZOLE SODIUM 40 MG IV SOLR
40.0000 mg | Freq: Once | INTRAVENOUS | Status: AC
Start: 2022-04-21 — End: 2022-04-21
  Administered 2022-04-21: 40 mg via INTRAVENOUS
  Filled 2022-04-21: qty 10

## 2022-04-21 MED ORDER — METOPROLOL SUCCINATE ER 25 MG PO TB24
12.5000 mg | ORAL_TABLET | Freq: Every day | ORAL | Status: DC
  Administered 2022-04-21: 12.5 mg via ORAL
  Filled 2022-04-21: qty 0.5
  Filled 2022-04-21: qty 1

## 2022-04-21 MED ORDER — BUPROPION HCL 100 MG PO TABS
100.0000 mg | ORAL_TABLET | Freq: Two times a day (BID) | ORAL | Status: DC
  Administered 2022-04-21 – 2022-04-22 (×3): 100 mg via ORAL
  Filled 2022-04-21 (×3): qty 1

## 2022-04-21 MED ORDER — SODIUM CHLORIDE 0.9 % IV SOLN: INTRAVENOUS | Status: DC

## 2022-04-21 MED ORDER — ACETAMINOPHEN 325 MG PO TABS: 650.0000 mg | ORAL_TABLET | Freq: Four times a day (QID) | ORAL | Status: DC | PRN

## 2022-04-21 MED ORDER — SODIUM CHLORIDE 0.9% FLUSH
3.0000 mL | Freq: Two times a day (BID) | INTRAVENOUS | Status: DC
  Administered 2022-04-21: 3 mL via INTRAVENOUS

## 2022-04-21 MED ORDER — NITROGLYCERIN 0.4 MG SL SUBL: 0.4000 mg | SUBLINGUAL_TABLET | SUBLINGUAL | Status: DC | PRN

## 2022-04-21 MED ORDER — HYDRALAZINE HCL 20 MG/ML IJ SOLN: 5.0000 mg | Freq: Three times a day (TID) | INTRAMUSCULAR | Status: DC | PRN

## 2022-04-21 MED ORDER — ONDANSETRON HCL 4 MG/2ML IJ SOLN: 4.0000 mg | Freq: Four times a day (QID) | INTRAMUSCULAR | Status: DC | PRN

## 2022-04-21 MED ORDER — SPIRONOLACTONE 25 MG PO TABS
25.0000 mg | ORAL_TABLET | Freq: Every day | ORAL | Status: DC
  Administered 2022-04-21: 25 mg via ORAL
  Filled 2022-04-21: qty 1

## 2022-04-21 MED ORDER — ACETAMINOPHEN 650 MG RE SUPP: 650.0000 mg | Freq: Four times a day (QID) | RECTAL | Status: DC | PRN

## 2022-04-21 MED ORDER — MORPHINE SULFATE (PF) 2 MG/ML IV SOLN: 1.0000 mg | Freq: Four times a day (QID) | INTRAVENOUS | Status: DC | PRN

## 2022-04-21 NOTE — H&P (Addendum)
History and Physical   [facility] [place] @ [facility] Admission History and Physical [employer], D.O.    Patient Name: Veronica Ellis MR#: [id number] Date of Birth: [birth year] Date of Admission: 04/20/2022  Referring MD/NP/PA: Dr. Quentin Cornwall Primary Care Physician: Juline Patch, MD  Chief Complaint:  Chief Complaint  Patient presents with   Hematuria    HPI: Veronica Ellis is a 88 y.o. female with a known history of recent GI bleed, atrial fibrillation on Coumadin, anxiety depression, arthritis, breast cancer, hypertension, hyperlipidemia, osteoporosis, IBS presents to the emergency department for evaluation of hematuria.  Patient reports blood in her toilet bowl consistent with hematuria, dysuria as well as dark bowel movements.  Patient denies fevers/chills, weakness, dizziness, chest pain, shortness of breath, N/V/C/D, changes in mental status.    Otherwise there has been no change in status. Patient has been taking medication as prescribed and there has been no recent change in medication or diet.  No recent antibiotics.  There has been no recent illness, hospitalizations, travel or sick contacts.    EMS/ED Course: Patient received Rocephin, vitamin K. Medical admission has been requested for further management of her tract infection, supratherapeutic INR.  Review of Systems:  CONSTITUTIONAL: No fever/chills, fatigue, weakness, weight gain/loss, headache. EYES: No blurry or double vision. ENT: No tinnitus, postnasal drip, redness or soreness of the oropharynx. RESPIRATORY: No cough, dyspnea, wheeze.  No hemoptysis.  CARDIOVASCULAR: No chest pain, palpitations, syncope, orthopnea. No lower extremity edema.  GASTROINTESTINAL: No nausea, vomiting, abdominal pain, diarrhea, constipation.  Positive dark stools GENITOURINARY: Positive dysuria and hematuria ENDOCRINE: No polyuria or nocturia. No heat or cold intolerance. HEMATOLOGY: No anemia, bruising,  bleeding. INTEGUMENTARY: No rashes, ulcers, lesions. MUSCULOSKELETAL: No arthritis, gout. NEUROLOGIC: No numbness, tingling, ataxia, seizure-type activity, weakness. PSYCHIATRIC: No anxiety, depression, insomnia.   Past Medical History:  Diagnosis Date   Anxiety    Anxiety    Arthritis    Breast cancer ([place]) 2014   bilateral invasive mammary carcinoma. with 36 rad tx.    Breast cancer ([place])    Cancer ([place])    breast   Depression    Depression    Hard of hearing    Hyperlipemia    Hypertension    IBS (irritable bowel syndrome)    Memory loss    Osteoporosis    Personal history of radiation therapy    Wears dentures    full upper, partial lower    Past Surgical History:  Procedure Laterality Date   APPENDECTOMY     BREAST BIOPSY Bilateral 2014   BREAST LUMPECTOMY Bilateral 05/2013   BREAST SURGERY Bilateral    CATARACT EXTRACTION W/PHACO Right 12/11/2017   Procedure: CATARACT EXTRACTION PHACO AND INTRAOCULAR LENS PLACEMENT ([place]) COMPLICATED RIGHT;  Surgeon: Leandrew Koyanagi, MD;  Location: [facility];  Service: Ophthalmology;  Laterality: Right;   COLON SURGERY     12 in. removed due to polyps   COLONOSCOPY WITH PROPOFOL N/A 02/10/2022   Procedure: COLONOSCOPY WITH PROPOFOL;  Surgeon: Lin Landsman, MD;  Location: [facility] ENDOSCOPY;  Service: Gastroenterology;  Laterality: N/A;   ESOPHAGOGASTRODUODENOSCOPY (EGD) WITH PROPOFOL N/A 02/09/2022   Procedure: ESOPHAGOGASTRODUODENOSCOPY (EGD) WITH PROPOFOL;  Surgeon: Jonathon Bellows, MD;  Location: [facility] ENDOSCOPY;  Service: Gastroenterology;  Laterality: N/A;   GIVENS CAPSULE STUDY N/A 02/11/2022   Procedure: GIVENS CAPSULE STUDY;  Surgeon: Lin Landsman, MD;  Location: [facility] ENDOSCOPY;  Service: Gastroenterology;  Laterality: N/A;   LEFT HEART CATH AND CORONARY ANGIOGRAPHY N/A 05/20/2018  Procedure: LEFT HEART CATH AND CORONARY ANGIOGRAPHY;  Surgeon: Corey Skains, MD;  Location: [facility];  Service:  Cardiovascular;  Laterality: N/A;   TUBAL LIGATION       reports that she quit smoking about 4 years ago. Her smoking use included cigarettes. She has a 60.00 pack-year smoking history. She has never used smokeless tobacco. She reports that she does not currently use alcohol. She reports that she does not use drugs.  Allergies  Allergen Reactions   Iodine Nausea Only    Betadine okay    Erythromycin Rash   Other Rash   Penicillins Rash    Has patient had a PCN reaction causing immediate rash, facial/tongue/throat swelling, SOB or lightheadedness with hypotension: No Has patient had a PCN reaction causing severe rash involving mucus membranes or skin necrosis: No Has patient had a PCN reaction that required hospitalization: No Has patient had a PCN reaction occurring within the last 10 years: No If all of the above answers are "NO", then may proceed with Cephalosporin use.    Shellfish Allergy Rash    Family History  Problem Relation Age of Onset   Breast cancer Daughter 51   Breast cancer Daughter 53   Cirrhosis Mother    Lung cancer Father     Prior to Admission medications   Medication Sig Start Date End Date Taking? Authorizing Provider  atorvastatin (LIPITOR) 80 MG tablet Take 80 mg by mouth at bedtime.   Yes [provider]  buPROPion (WELLBUTRIN) 100 MG tablet Take 1 tablet (100 mg total) by mouth 2 (two) times daily. 02/15/22  Yes Juline Patch, MD  ezetimibe (ZETIA) 10 MG tablet Take by mouth. Take 1 tablet (10 mg total) by mouth once daily 12/26/21  Yes [provider]  magnesium oxide (MAG-OX) 400 MG tablet Take 1 tablet by mouth daily. 12/26/21 12/26/22 Yes [provider]  metoprolol succinate (TOPROL-XL) 25 MG 24 hr tablet Take 0.5 tablets (12.5 mg total) by mouth daily. 02/13/22  Yes Wouk, Ailene Rud, MD  omeprazole (PRILOSEC) 40 MG capsule Take 1 capsule (40 mg total) by mouth 2 (two) times daily. 02/15/22  Yes Juline Patch, MD   sacubitril-valsartan (ENTRESTO) 24-26 MG Take 1 tablet by mouth 2 (two) times daily. 03/26/22  Yes [provider]  spironolactone (ALDACTONE) 25 MG tablet Take 25 mg by mouth at bedtime. 12/31/19  Yes [provider]  warfarin (COUMADIN) 1 MG tablet Take 4 mg by mouth daily. 03/21/22  Yes [provider]  nitroGLYCERIN (NITROSTAT) 0.4 MG SL tablet Place 0.4 mg under the tongue every 5 (five) minutes as needed for chest pain.    [provider]  torsemide (DEMADEX) 10 MG tablet Take by mouth. Take 1 tablet (10 mg total) by mouth once daily as needed (take if weight increases by 2lbs in 1 day or 5lbs in 1 week from 150lbs.) 12/26/21   [provider]    Physical Exam: Vitals:   04/20/22 2300 04/20/22 2330 04/20/22 2345 04/21/22 0000  BP: (!) 144/49 (!) 123/52  (!) 156/57  Pulse: 65  65 67  Resp:      Temp:      TempSrc:      SpO2: 96%  96% 100%  Weight:        GENERAL: 88 y.o.-year-old white female patient, well-developed, well-nourished lying in the bed in no acute distress.  Pleasant and cooperative.   HEENT: Head atraumatic, normocephalic. Mucus membranes moist. NECK:  Supple. No JVD. CHEST: Normal breath sounds bilaterally. No wheezing, rales, rhonchi or crackles. No use of accessory muscles of respiration.   CARDIOVASCULAR: S1, S2 normal. No murmurs, rubs, or gallops. Cap refill <2 seconds. Pulses intact distally.  ABDOMEN: Soft, nondistended, nontender. No rebound, guarding, rigidity. Normoactive bowel sounds present in all four quadrants.  Positive guaiac per EDP EXTREMITIES: No pedal edema, cyanosis, or clubbing. No calf tenderness or Homan's sign.  NEUROLOGIC: The patient is alert and oriented x 3. Cranial nerves II through XII are grossly intact with no focal sensorimotor deficit. PSYCHIATRIC:  Normal affect, mood, thought content. SKIN: Warm, dry, and intact without obvious rash, lesion, or ulcer.    Labs on Admission:  CBC: Recent  Labs  Lab 04/20/22 1943  WBC 7.3  HGB 10.7*  HCT 34.5*  MCV 92.5  PLT 301   Basic Metabolic Panel: Recent Labs  Lab 04/20/22 1943  NA 139  K 4.1  CL 109  CO2 25  GLUCOSE 98  BUN 22  CREATININE 1.44*  CALCIUM 8.7*   GFR: Estimated Creatinine Clearance: 28.7 mL/min (A) (by C-G formula based on SCr of 1.44 mg/dL (H)). Liver Function Tests: Recent Labs  Lab 04/20/22 1943  AST 21  ALT 28  ALKPHOS 73  BILITOT 0.6  PROT 6.6  ALBUMIN 3.4*   No results for input(s): "LIPASE", "AMYLASE" in the last 168 hours. No results for input(s): "AMMONIA" in the last 168 hours. Coagulation Profile: Recent Labs  Lab 04/20/22 1511  INR 7.3*   Cardiac Enzymes: No results for input(s): "CKTOTAL", "CKMB", "CKMBINDEX", "TROPONINI" in the last 168 hours. BNP (last 3 results) No results for input(s): "PROBNP" in the last 8760 hours. HbA1C: No results for input(s): "HGBA1C" in the last 72 hours. CBG: No results for input(s): "GLUCAP" in the last 168 hours. Lipid Profile: No results for input(s): "CHOL", "HDL", "LDLCALC", "TRIG", "CHOLHDL", "LDLDIRECT" in the last 72 hours. Thyroid Function Tests: No results for input(s): "TSH", "T4TOTAL", "FREET4", "T3FREE", "THYROIDAB" in the last 72 hours. Anemia Panel: No results for input(s): "VITAMINB12", "FOLATE", "FERRITIN", "TIBC", "IRON", "RETICCTPCT" in the last 72 hours. Urine analysis:    Component Value Date/Time   COLORURINE RED (A) 04/20/2022 2152   APPEARANCEUR TURBID (A) 04/20/2022 2152   APPEARANCEUR Hazy 04/29/2014 1810   LABSPEC 1.010 04/20/2022 2152   LABSPEC 1.023 04/29/2014 1810   PHURINE  04/20/2022 2152    TEST NOT REPORTED DUE TO COLOR INTERFERENCE OF URINE PIGMENT   GLUCOSEU (A) 04/20/2022 2152    TEST NOT REPORTED DUE TO COLOR INTERFERENCE OF URINE PIGMENT   GLUCOSEU Negative 04/29/2014 1810   HGBUR (A) 04/20/2022 2152    TEST NOT REPORTED DUE TO COLOR INTERFERENCE OF URINE PIGMENT   BILIRUBINUR (A) 04/20/2022 2152     TEST NOT REPORTED DUE TO COLOR INTERFERENCE OF URINE PIGMENT   BILIRUBINUR Negative 04/29/2014 1810   KETONESUR (A) 04/20/2022 2152    TEST NOT REPORTED DUE TO COLOR INTERFERENCE OF URINE PIGMENT   PROTEINUR (A) 04/20/2022 2152    TEST NOT REPORTED DUE TO COLOR INTERFERENCE OF URINE PIGMENT   NITRITE (A) 04/20/2022 2152    TEST NOT REPORTED DUE TO COLOR INTERFERENCE OF URINE PIGMENT   LEUKOCYTESUR (A) 04/20/2022 2152    TEST NOT REPORTED DUE TO COLOR INTERFERENCE OF URINE PIGMENT   LEUKOCYTESUR Trace 04/29/2014 1810   Sepsis Labs: '@LABRCNTIP'$ (procalcitonin:4,lacticidven:4) )No results found for this or any previous visit (from the past 240 hour(s)).   Radiological Exams on Admission:  CT ABDOMEN PELVIS WO CONTRAST  Result Date: 04/20/2022 CLINICAL DATA:  Right lower quadrant pain and hematuria EXAM: CT ABDOMEN AND PELVIS WITHOUT CONTRAST TECHNIQUE: Multidetector CT imaging of the abdomen and pelvis was performed following the standard protocol without IV contrast. RADIATION DOSE REDUCTION: This exam was performed according to the departmental dose-optimization program which includes automated exposure control, adjustment of the mA and/or kV according to patient size and/or use of iterative reconstruction technique. COMPARISON:  03/04/2020 FINDINGS: Lower chest: Scarring is noted in the right middle lobe stable from the prior exam. Hepatobiliary: Liver is within normal limits. Large gallstone is noted stable from the prior study. Pancreas: Unremarkable. No pancreatic ductal dilatation or surrounding inflammatory changes. Spleen: Normal in size without focal abnormality. Adrenals/Urinary Tract: Adrenal glands again demonstrate bilateral hypodense lesions likely representing adenomas. They have been stable for several years dating back to 2014 and consistent with a benign etiology. Vascular calcifications are noted in the kidneys bilaterally. Prominent extrarenal pelvis is again noted on the right.  Bladder is partially distended. Stomach/Bowel: Postsurgical changes are noted in the mid transverse colon stable in appearance from the prior exam. No obstructive changes are seen. Small bowel and stomach are unremarkable. Vascular/Lymphatic: Diffuse aortic calcifications are noted without aneurysmal dilatation. No lymphadenopathy is seen. Reproductive: Uterus is again well visualized with diffuse parenchymal calcifications. Stable 5.6 cm left ovarian cyst is noted unchanged from 2014 Other: No abdominal wall hernia or abnormality. No abdominopelvic ascites. Musculoskeletal: Degenerative changes of lumbar spine are noted. IMPRESSION: Cholelithiasis without complicating factors. Fullness of the right renal pelvis stable from the prior exam. No obstructing lesion is seen. Stable hypodense adrenal lesions dating back to 2014 consistent with adenomas. Stable left ovarian cyst Electronically Signed   By: Inez Catalina M.D.   On: 04/20/2022 23:08     Assessment/Plan  This is a 88 y.o. female with a history of ecent GI bleed, atrial fibrillation on Coumadin, anxiety depression, arthritis, breast cancer, hypertension, hyperlipidemia, osteoporosis, IBS now being admitted with:  #. Lower GI Bleed with supratherapeutic INR -Admit inpatient -IV Protonix '40mg'$  BID -Serial CBCs -Nothing by mouth -IV fluid hydration -Hold anticoagulants - Consider GI consult, follows with Dr. Verl Blalock   #.  Urinary tract infection - Continue Rocephin - Follow-up cultures  #.  Therapeutic INR status post vitamin K in the emergency department - Check daily INR - Hold Coumadin at this time  #. History of hyperlipidemia - Continue Lipitor, Zetia  #. History of atrial fibrillation - Continue metoprolol -Hold Coumadin  #. History of hypertension -Resume home medications pending medication reconciliation  #. History of anxiety depression - Continue Wellbutrin   Admission status: Inpatient IV Fluids: Normal  saline Diet/Nutrition: N.p.o. Consults called: GI Dr. Verl Blalock DVT Px: SCDs and early ambulation. Code Status: Full Code  Disposition Plan: To home in-2 days  All the records are reviewed and case discussed with ED provider. Management plans discussed with the patient and/or family who express understanding and agree with plan of care.  Veronica Ellis D.O. on 04/21/2022 at 12:08 AM CC: Primary care physician; Juline Patch, MD   04/21/2022, 12:08 AM

## 2022-04-21 NOTE — ED Notes (Signed)
Pt assisted to bathroom to urinate. Blood seen in urine by this tech.

## 2022-04-21 NOTE — Progress Notes (Signed)
  Progress Note   Patient: Veronica Ellis QMG:[id number] DOB: 05-30-1934 DOA: 04/20/2022     0 DOS: the patient was seen and examined on 04/21/2022        Brief hospital course: Mrs. Schier is an 88 y.o. F with hx Afib on warfarin, Depression, BrCA, HTN, recent GI bleed who presented with hematuria and dark BMs.  In the ER, she had gross hematuria and an INR >7.  Hgb was 10, close to baseline.  She was given 2.5 mg oral vitamin K and admitted.     Assessment and Plan: This is a no charge note, for further details, please see H&P by Dr. Baruch Merl from earlier today.  Patient admitted with supratherapeutic INR and hematuria.  Discussed with Dr. Marius Ditch.  Given vitamin K.  Bleeding seems to be resolving.            Physical Exam: Vitals:   04/21/22 1200 04/21/22 1215 04/21/22 1230 04/21/22 1245  BP: 92/78     Pulse: 67 68 62 73  Resp: 20 (!) 28    Temp:      TempSrc:      SpO2: 96% 95% 99% 100%  Weight:          Family Communication: Daughter    Disposition: Status is: Inpatient         Author: Edwin Dada, MD 04/21/2022 5:22 PM  For on call review [email or web address].

## 2022-04-21 NOTE — ED Notes (Signed)
Floor Coverage Provider Eugenie Norrie, MD made aware of critical INR result of 8.2

## 2022-04-21 NOTE — Hospital Course (Signed)
Veronica Ellis is an 88 y.o. F with hx Afib on warfarin, Depression, BrCA, HTN, recent GI bleed who presented with hematuria and dark BMs.  In the ER, she had gross hematuria and an INR >7.  Hgb was 10, close to baseline.  She was given 2.5 mg oral vitamin K and admitted.

## 2022-04-21 NOTE — Consult Note (Signed)
Cephas Darby, MD [address]  [place]  [place], [place] [address]  Main: [phone]  Fax: [phone] Pager: [phone]   Consultation  Referring Provider:     No ref. provider found Primary Care Physician:  Juline Patch, MD Primary Gastroenterologist:  Dr. Vicente Males       Reason for Consultation: Anemia  Date of Admission:  04/20/2022 Date of Consultation:  04/21/2022         HPI:   Veronica Ellis is a 88 y.o. female history of A-fib on Coumadin, anxiety, depression, arthritis, history of breast cancer, hypertension, hyperlipidemia, osteoporosis presented to the ER secondary to hematuria in setting of supratherapeutic INR. Her INR on presentation was 7.3, hemoglobin was 10.7, dropped to 9.3 today.  It was 11.7 on 02/26/2022.  Urinalysis revealed gross hematuria.  Unclear why GI has been consulted for hematuria. Patient recently underwent GI work-up for anemia, EGD, colonoscopy and small bowel capsule endoscopy were all unremarkable in 02/2022. When I interviewed the patient, she reported that she was peeing blood.  She reported her stools were dark brown.  She denied any rectal bleeding, melena.  She reported that she had GI bleed during last hospitalization only.  She is kept n.p.o. and would like to eat.   NSAIDs: None  Antiplts/Anticoagulants/Anti thrombotics: Coming in for history of A-fib  GI Procedures: EGD and colonoscopy in 02/2022 negative Video capsule endoscopy in 02/2022 negative  Past Medical History:  Diagnosis Date   Anxiety    Anxiety    Arthritis    Breast cancer ([place]) 2014   bilateral invasive mammary carcinoma. with 36 rad tx.    Breast cancer ([place])    Cancer ([place])    breast   Depression    Depression    Hard of hearing    Hyperlipemia    Hypertension    IBS (irritable bowel syndrome)    Memory loss    Osteoporosis    Personal history of radiation therapy    Wears dentures    full upper, partial lower    Past Surgical  History:  Procedure Laterality Date   APPENDECTOMY     BREAST BIOPSY Bilateral 2014   BREAST LUMPECTOMY Bilateral 05/2013   BREAST SURGERY Bilateral    CATARACT EXTRACTION W/PHACO Right 12/11/2017   Procedure: CATARACT EXTRACTION PHACO AND INTRAOCULAR LENS PLACEMENT ([place]) COMPLICATED RIGHT;  Surgeon: Leandrew Koyanagi, MD;  Location: [facility];  Service: Ophthalmology;  Laterality: Right;   COLON SURGERY     12 in. removed due to polyps   COLONOSCOPY WITH PROPOFOL N/A 02/10/2022   Procedure: COLONOSCOPY WITH PROPOFOL;  Surgeon: Lin Landsman, MD;  Location: [facility] ENDOSCOPY;  Service: Gastroenterology;  Laterality: N/A;   ESOPHAGOGASTRODUODENOSCOPY (EGD) WITH PROPOFOL N/A 02/09/2022   Procedure: ESOPHAGOGASTRODUODENOSCOPY (EGD) WITH PROPOFOL;  Surgeon: Jonathon Bellows, MD;  Location: [facility] ENDOSCOPY;  Service: Gastroenterology;  Laterality: N/A;   GIVENS CAPSULE STUDY N/A 02/11/2022   Procedure: GIVENS CAPSULE STUDY;  Surgeon: Lin Landsman, MD;  Location: [facility] ENDOSCOPY;  Service: Gastroenterology;  Laterality: N/A;   LEFT HEART CATH AND CORONARY ANGIOGRAPHY N/A 05/20/2018   Procedure: LEFT HEART CATH AND CORONARY ANGIOGRAPHY;  Surgeon: Corey Skains, MD;  Location: [facility];  Service: Cardiovascular;  Laterality: N/A;   TUBAL LIGATION       Current Facility-Administered Medications:    0.9 %  sodium chloride infusion, , Intravenous, Continuous, Hugelmeyer, Alexis, DO, Last Rate: 75 mL/hr at 04/21/22 0327,  New Bag at 04/21/22 0327   acetaminophen (TYLENOL) tablet 650 mg, 650 mg, Oral, Q6H PRN **OR** acetaminophen (TYLENOL) suppository 650 mg, 650 mg, Rectal, Q6H PRN, Hugelmeyer, Alexis, DO   atorvastatin (LIPITOR) tablet 80 mg, 80 mg, Oral, q1800, Belue, Nathan S, RPH   bisacodyl (DULCOLAX) EC tablet 5 mg, 5 mg, Oral, Daily PRN, Hugelmeyer, Alexis, DO   buPROPion [facility]) tablet 100 mg, 100 mg, Oral, BID, Hugelmeyer, Alexis, DO, 100 mg at 04/21/22 0951    cefTRIAXone (ROCEPHIN) 1 g in sodium chloride 0.9 % 100 mL IVPB, 1 g, Intravenous, Q24H, Belue, Nathan S, RPH   ezetimibe (ZETIA) tablet 10 mg, 10 mg, Oral, Daily, Hugelmeyer, Alexis, DO, 10 mg at 04/21/22 0951   hydrALAZINE (APRESOLINE) injection 5 mg, 5 mg, Intravenous, Q8H PRN, Hugelmeyer, Alexis, DO   HYDROcodone-acetaminophen (NORCO/VICODIN) 5-325 MG per tablet 1-2 tablet, 1-2 tablet, Oral, Q4H PRN, Hugelmeyer, Alexis, DO, 1 tablet at 04/21/22 0442   metoprolol succinate (TOPROL-XL) 24 hr tablet 12.5 mg, 12.5 mg, Oral, Daily, Hugelmeyer, Alexis, DO, 12.5 mg at 04/21/22 0951   morphine (PF) 2 MG/ML injection 1 mg, 1 mg, Intravenous, Q6H PRN, Hugelmeyer, Alexis, DO   nitroGLYCERIN (NITROSTAT) SL tablet 0.4 mg, 0.4 mg, Sublingual, Q5 min PRN, Hugelmeyer, Alexis, DO   ondansetron (ZOFRAN) tablet 4 mg, 4 mg, Oral, Q6H PRN **OR** ondansetron (ZOFRAN) injection 4 mg, 4 mg, Intravenous, Q6H PRN, Hugelmeyer, Alexis, DO   pantoprazole (PROTONIX) injection 40 mg, 40 mg, Intravenous, Q12H, Belue, Nathan S, RPH   sacubitril-valsartan (ENTRESTO) 24-26 mg per tablet, 1 tablet, Oral, BID, Danford, Suann Larry, MD   senna-docusate (Senokot-S) tablet 1 tablet, 1 tablet, Oral, QHS PRN, Hugelmeyer, Alexis, DO   spironolactone (ALDACTONE) tablet 25 mg, 25 mg, Oral, Daily, Danford, Suann Larry, MD   traZODone (DESYREL) tablet 25 mg, 25 mg, Oral, QHS PRN, Hugelmeyer, Alexis, DO  Current Outpatient Medications:    atorvastatin (LIPITOR) 80 MG tablet, Take 80 mg by mouth at bedtime., Disp: , Rfl:    buPROPion (WELLBUTRIN) 100 MG tablet, Take 1 tablet (100 mg total) by mouth 2 (two) times daily., Disp: 180 tablet, Rfl: 1   ezetimibe (ZETIA) 10 MG tablet, Take by mouth. Take 1 tablet (10 mg total) by mouth once daily, Disp: , Rfl:    magnesium oxide (MAG-OX) 400 MG tablet, Take 1 tablet by mouth daily., Disp: , Rfl:    metoprolol succinate (TOPROL-XL) 25 MG 24 hr tablet, Take 0.5 tablets (12.5 mg total) by mouth  daily., Disp: , Rfl:    omeprazole (PRILOSEC) 40 MG capsule, Take 1 capsule (40 mg total) by mouth 2 (two) times daily., Disp: 180 capsule, Rfl: 1   sacubitril-valsartan (ENTRESTO) 24-26 MG, Take 1 tablet by mouth 2 (two) times daily., Disp: , Rfl:    spironolactone (ALDACTONE) 25 MG tablet, Take 25 mg by mouth at bedtime., Disp: , Rfl:    warfarin (COUMADIN) 1 MG tablet, Take 4 mg by mouth daily., Disp: , Rfl:    nitroGLYCERIN (NITROSTAT) 0.4 MG SL tablet, Place 0.4 mg under the tongue every 5 (five) minutes as needed for chest pain., Disp: , Rfl:    torsemide (DEMADEX) 10 MG tablet, Take by mouth. Take 1 tablet (10 mg total) by mouth once daily as needed (take if weight increases by 2lbs in 1 day or 5lbs in 1 week from 150lbs.), Disp: , Rfl:    Family History  Problem Relation Age of Onset   Breast cancer Daughter 96   Breast cancer Daughter  77   Cirrhosis Mother    Lung cancer Father      Social History   Tobacco Use   Smoking status: Former    Packs/day: 1.00    Years: 60.00    Total pack years: 60.00    Types: Cigarettes    Quit date: 12/2017    Years since quitting: 4.3   Smokeless tobacco: Never   Tobacco comments:    smoking cessation materials not required  Vaping Use   Vaping Use: Never used  Substance Use Topics   Alcohol use: Not Currently    Alcohol/week: 0.0 standard drinks of alcohol   Drug use: No    Allergies as of 04/20/2022 - Review Complete 04/20/2022  Allergen Reaction Noted   Iodine Nausea Only 02/16/2015   Erythromycin Rash 02/16/2015   Other Rash 02/16/2015   Penicillins Rash 02/16/2015   Shellfish allergy Rash 02/16/2015    Review of Systems:    All systems reviewed and negative except where noted in HPI.   Physical Exam:  Vital signs in last 24 hours: Temp:  [97.9 F (36.6 C)-98.6 F (37 C)] 98.3 F (36.8 C) (08/12 0744) Pulse Rate:  [56-73] 73 (08/12 1245) Resp:  [10-28] 28 (08/12 1215) BP: (92-166)/(42-78) 92/78 (08/12 1200) SpO2:   [94 %-100 %] 100 % (08/12 1245) Weight:  [73 kg] 73 kg (08/11 1509)   General:   Pleasant, cooperative in NAD Head:  Normocephalic and atraumatic. Eyes:   No icterus.   Conjunctiva pink. PERRLA. Ears:  Normal auditory acuity. Neck:  Supple; no masses or thyroidomegaly Lungs: Respirations even and unlabored. Lungs clear to auscultation bilaterally.   No wheezes, crackles, or rhonchi.  Heart:  Regular rate and rhythm;  Without murmur, clicks, rubs or gallops Abdomen:  Soft, nondistended, nontender. Normal bowel sounds. No appreciable masses or hepatomegaly.  No rebound or guarding.  Rectal:  Not performed. Msk:  Symmetrical without gross deformities.  Strength normal Extremities:  Without edema, cyanosis or clubbing. Neurologic:  Alert and oriented x3;  grossly normal neurologically. Skin:  Intact without significant lesions or rashes. Psych:  Alert and cooperative. Normal affect.  LAB RESULTS:    Latest Ref Rng & Units 04/21/2022    4:45 AM 04/20/2022    7:43 PM 02/26/2022    2:32 PM  CBC  WBC 4.0 - 10.5 K/uL 8.4  7.3  5.6   Hemoglobin 12.0 - 15.0 g/dL 9.3  10.7  11.7   Hematocrit 36.0 - 46.0 % 29.8  34.5  35.6   Platelets 150 - 400 K/uL 275  325  348     BMET    Latest Ref Rng & Units 04/21/2022    4:45 AM 04/20/2022    7:43 PM 02/13/2022    4:57 AM  BMP  Glucose 70 - 99 mg/dL 96  98  112   BUN 8 - 23 mg/dL 18  22  10    Creatinine 0.44 - 1.00 mg/dL 1.37  1.44  1.10   Sodium 135 - 145 mmol/L 139  139  140   Potassium 3.5 - 5.1 mmol/L 3.9  4.1  3.4   Chloride 98 - 111 mmol/L 109  109  115   CO2 22 - 32 mmol/L 25  25  22    Calcium 8.9 - 10.3 mg/dL 8.3  8.7  7.9     LFT    Latest Ref Rng & Units 04/21/2022    4:45 AM 04/20/2022    7:43 PM 02/12/2022  12:40 AM  Hepatic Function  Total Protein 6.5 - 8.1 g/dL 5.9  6.6  5.2   Albumin 3.5 - 5.0 g/dL 3.0  3.4  2.7   AST 15 - 41 U/L 23  21  20    ALT 0 - 44 U/L 28  28  25    Alk Phosphatase 38 - 126 U/L 59  73  61   Total  Bilirubin 0.3 - 1.2 mg/dL 0.5  0.6  0.8      STUDIES: CT ABDOMEN PELVIS WO CONTRAST  Result Date: 04/20/2022 CLINICAL DATA:  Right lower quadrant pain and hematuria EXAM: CT ABDOMEN AND PELVIS WITHOUT CONTRAST TECHNIQUE: Multidetector CT imaging of the abdomen and pelvis was performed following the standard protocol without IV contrast. RADIATION DOSE REDUCTION: This exam was performed according to the departmental dose-optimization program which includes automated exposure control, adjustment of the mA and/or kV according to patient size and/or use of iterative reconstruction technique. COMPARISON:  03/04/2020 FINDINGS: Lower chest: Scarring is noted in the right middle lobe stable from the prior exam. Hepatobiliary: Liver is within normal limits. Large gallstone is noted stable from the prior study. Pancreas: Unremarkable. No pancreatic ductal dilatation or surrounding inflammatory changes. Spleen: Normal in size without focal abnormality. Adrenals/Urinary Tract: Adrenal glands again demonstrate bilateral hypodense lesions likely representing adenomas. They have been stable for several years dating back to 2014 and consistent with a benign etiology. Vascular calcifications are noted in the kidneys bilaterally. Prominent extrarenal pelvis is again noted on the right. Bladder is partially distended. Stomach/Bowel: Postsurgical changes are noted in the mid transverse colon stable in appearance from the prior exam. No obstructive changes are seen. Small bowel and stomach are unremarkable. Vascular/Lymphatic: Diffuse aortic calcifications are noted without aneurysmal dilatation. No lymphadenopathy is seen. Reproductive: Uterus is again well visualized with diffuse parenchymal calcifications. Stable 5.6 cm left ovarian cyst is noted unchanged from 2014 Other: No abdominal wall hernia or abnormality. No abdominopelvic ascites. Musculoskeletal: Degenerative changes of lumbar spine are noted. IMPRESSION:  Cholelithiasis without complicating factors. Fullness of the right renal pelvis stable from the prior exam. No obstructing lesion is seen. Stable hypodense adrenal lesions dating back to 2014 consistent with adenomas. Stable left ovarian cyst Electronically Signed   By: Inez Catalina M.D.   On: 04/20/2022 23:08      Impression / Plan:   Mirelle Biskup is a 88 y.o. female with history of A-fib on Coumadin is admitted with gross hematuria in setting of supratherapeutic INR.  Patient does not have any signs or symptoms to suggest GI source of bleeding or anemia during this admission. She underwent upper endoscopy, colonoscopy as well as video capsule endoscopy in 02/2022 which were all unremarkable I do not recommend any GI work-up at this time Diet as tolerated GI will sign off, please call us back with questions or concerns  Thank you for involving me in the care of this patient.      LOS: 0 days   Sherri Sear, MD  04/21/2022, 1:23 PM    Note: This dictation was prepared with Dragon dictation along with smaller phrase technology. Any transcriptional errors that result from this process are unintentional.

## 2022-04-21 NOTE — Progress Notes (Signed)
Pt transferred to 115.

## 2022-04-22 DIAGNOSIS — D689 Coagulation defect, unspecified: Secondary | ICD-10-CM | POA: Diagnosis not present

## 2022-04-22 LAB — PROTIME-INR
INR: 1.4 — ABNORMAL HIGH (ref 0.8–1.2)
Prothrombin Time: 17.2 seconds — ABNORMAL HIGH (ref 11.4–15.2)

## 2022-04-22 LAB — BASIC METABOLIC PANEL
Anion gap: 3 — ABNORMAL LOW (ref 5–15)
BUN: 19 mg/dL (ref 8–23)
CO2: 25 mmol/L (ref 22–32)
Calcium: 8.2 mg/dL — ABNORMAL LOW (ref 8.9–10.3)
Chloride: 113 mmol/L — ABNORMAL HIGH (ref 98–111)
Creatinine, Ser: 1.3 mg/dL — ABNORMAL HIGH (ref 0.44–1.00)
GFR, Estimated: 40 mL/min — ABNORMAL LOW (ref >60)
Glucose, Bld: 126 mg/dL — ABNORMAL HIGH (ref 70–99)
Potassium: 4 mmol/L (ref 3.5–5.1)
Sodium: 141 mmol/L (ref 135–145)

## 2022-04-22 LAB — CBC
HCT: 28.3 % — ABNORMAL LOW (ref 36.0–46.0)
Hemoglobin: 9.2 g/dL — ABNORMAL LOW (ref 12.0–15.0)
MCH: 29 pg (ref 26.0–34.0)
MCHC: 32.5 g/dL (ref 30.0–36.0)
MCV: 89.3 fL (ref 80.0–100.0)
Platelets: 265 10*3/uL (ref 150–400)
RBC: 3.17 MIL/uL — ABNORMAL LOW (ref 3.87–5.11)
RDW: 14.6 % (ref 11.5–15.5)
WBC: 6.2 10*3/uL (ref 4.0–10.5)
nRBC: 0 % (ref 0.0–0.2)

## 2022-04-22 LAB — VITAMIN B12: Vitamin B-12: 158 pg/mL — ABNORMAL LOW (ref 180–914)

## 2022-04-22 MED ORDER — APIXABAN 5 MG PO TABS: 5.0000 mg | ORAL_TABLET | Freq: Two times a day (BID) | ORAL | 3 refills | Status: AC

## 2022-04-22 NOTE — Progress Notes (Signed)
Veronica Ellis to be D/C'd Home per MD order.  Discussed prescriptions and follow up appointments with the patient. Prescriptions given to patient, medication list explained in detail. Pt verbalized understanding.  Allergies as of 04/22/2022       Reactions   Iodine Nausea Only   Betadine okay   Erythromycin Rash   Other Rash   Penicillins Rash   Has patient had a PCN reaction causing immediate rash, facial/tongue/throat swelling, SOB or lightheadedness with hypotension: No Has patient had a PCN reaction causing severe rash involving mucus membranes or skin necrosis: No Has patient had a PCN reaction that required hospitalization: No Has patient had a PCN reaction occurring within the last 10 years: No If all of the above answers are "NO", then may proceed with Cephalosporin use.   Shellfish Allergy Rash        Medication List     STOP taking these medications    warfarin 1 MG tablet Commonly known as: COUMADIN       TAKE these medications    apixaban 5 MG Tabs tablet Commonly known as: ELIQUIS Take 1 tablet (5 mg total) by mouth 2 (two) times daily.   atorvastatin 80 MG tablet Commonly known as: LIPITOR Take 80 mg by mouth at bedtime.   buPROPion 100 MG tablet Commonly known as: WELLBUTRIN Take 1 tablet (100 mg total) by mouth 2 (two) times daily.   ezetimibe 10 MG tablet Commonly known as: ZETIA Take by mouth. Take 1 tablet (10 mg total) by mouth once daily   magnesium oxide 400 MG tablet Commonly known as: MAG-OX Take 1 tablet by mouth daily.   metoprolol succinate 25 MG 24 hr tablet Commonly known as: TOPROL-XL Take 0.5 tablets (12.5 mg total) by mouth daily.   nitroGLYCERIN 0.4 MG SL tablet Commonly known as: NITROSTAT Place 0.4 mg under the tongue every 5 (five) minutes as needed for chest pain.   omeprazole 40 MG capsule Commonly known as: PRILOSEC Take 1 capsule (40 mg total) by mouth 2 (two) times daily.   sacubitril-valsartan 24-26  MG Commonly known as: ENTRESTO Take 1 tablet by mouth 2 (two) times daily.   spironolactone 25 MG tablet Commonly known as: ALDACTONE Take 25 mg by mouth at bedtime.   torsemide 10 MG tablet Commonly known as: DEMADEX Take by mouth. Take 1 tablet (10 mg total) by mouth once daily as needed (take if weight increases by 2lbs in 1 day or 5lbs in 1 week from 150lbs.)        Vitals:   04/22/22 0604 04/22/22 0752  BP: (!) 102/41 (!) 99/50  Pulse: 61 60  Resp: 16 18  Temp: 99 F (37.2 C) 99 F (37.2 C)  SpO2: 95% 96%    Skin clean, dry and intact without evidence of skin break down, no evidence of skin tears noted. IV catheter discontinued intact. Site without signs and symptoms of complications. Dressing and pressure applied. Pt denies pain at this time. No complaints noted.  An After Visit Summary was printed and given to the patient. Patient escorted via [place], and D/C home via private auto.  Rolley Sims

## 2022-04-22 NOTE — Discharge Summary (Signed)
Physician Discharge Summary   Patient: Veronica Ellis MRN: [id number] DOB: 1934-04-24  Admit date:     04/20/2022  Discharge date: 04/22/22  Discharge Physician: Edwin Dada   PCP: Juline Patch, MD     Recommendations at discharge:  Follow up with Cardiology Dr. Jalene Mullet in 1 week Follow up with PCP Dr. Ronnald Ramp     Discharge Diagnoses: Principal Problem:   Coagulopathy/supratherapeutic INR on warfarin causing gross hematuria Active Problems:   Gross hematuria   Chronic systolic CHF (congestive heart failure) (HCC)   Chronic kidney disease stage IIIa   Paroxysmal A-fib (HCC)   GI bleed, ruled out   Cerebrovascular disease   Anxiety and depression   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HCC)   LBBB (left bundle branch block)   Dyslipidemia   Essential hypertension     Hospital Course: Veronica Ellis is an 88 y.o. F with hx Afib on warfarin, Depression, BrCA, HTN, recent GI bleed who presented with hematuria and dark BMs.  In the ER, she had gross hematuria and an INR >7.  Hgb was 10, close to baseline.  She was given 2.5 mg oral vitamin K and admitted.    Supratherapeutic INR Gross hematuria Unclear precipitant of elevated INR.    Reviewed with son/caregiver.  No recent medication misuse, but he reports she eats at night sometimes, he doesn't know what, and she takes supplements sometimes, he doesn't know what.    No recent antibiotics, no recent med changes that patient or son can remember, although they are limited historians.  No evidence of liver or kidney disease more than baseline.    She was given vitamin K, and her INR returned to 1.4, and her hematuria resolved.  Serial hemoglobins remained stable.  There was some initial concern for GI bleeding, but this was ruled out, she was evaluated by gastroenterology who recommended no further work-up.  In light of her second episode of supratherapeutic INR with clinical bleeding in the last 4 months, and in the  light of the fact that she takes warfarin for nonvalvular atrial fibrillation, she was switched to Eliquis.  Recommend close follow-up with her cardiologist.          [facility] Controlled Substances Registry was reviewed for this patient prior to discharge.   Consultants: GI   Disposition: Home Diet recommendation:  Discharge Diet Orders (From admission, onward)     Start     Ordered   04/22/22 0000  Diet - low sodium heart healthy        04/22/22 1011             DISCHARGE MEDICATION: Allergies as of 04/22/2022       Reactions   Iodine Nausea Only   Betadine okay   Erythromycin Rash   Other Rash   Penicillins Rash   Has patient had a PCN reaction causing immediate rash, facial/tongue/throat swelling, SOB or lightheadedness with hypotension: No Has patient had a PCN reaction causing severe rash involving mucus membranes or skin necrosis: No Has patient had a PCN reaction that required hospitalization: No Has patient had a PCN reaction occurring within the last 10 years: No If all of the above answers are "NO", then may proceed with Cephalosporin use.   Shellfish Allergy Rash        Medication List     STOP taking these medications    warfarin 1 MG tablet Commonly known as: COUMADIN       TAKE these medications  apixaban 5 MG Tabs tablet Commonly known as: ELIQUIS Take 1 tablet (5 mg total) by mouth 2 (two) times daily.   atorvastatin 80 MG tablet Commonly known as: LIPITOR Take 80 mg by mouth at bedtime.   buPROPion 100 MG tablet Commonly known as: WELLBUTRIN Take 1 tablet (100 mg total) by mouth 2 (two) times daily.   ezetimibe 10 MG tablet Commonly known as: ZETIA Take by mouth. Take 1 tablet (10 mg total) by mouth once daily   magnesium oxide 400 MG tablet Commonly known as: MAG-OX Take 1 tablet by mouth daily.   metoprolol succinate 25 MG 24 hr tablet Commonly known as: TOPROL-XL Take 0.5 tablets (12.5 mg total) by mouth  daily.   nitroGLYCERIN 0.4 MG SL tablet Commonly known as: NITROSTAT Place 0.4 mg under the tongue every 5 (five) minutes as needed for chest pain.   omeprazole 40 MG capsule Commonly known as: PRILOSEC Take 1 capsule (40 mg total) by mouth 2 (two) times daily.   sacubitril-valsartan 24-26 MG Commonly known as: ENTRESTO Take 1 tablet by mouth 2 (two) times daily.   spironolactone 25 MG tablet Commonly known as: ALDACTONE Take 25 mg by mouth at bedtime.   torsemide 10 MG tablet Commonly known as: DEMADEX Take by mouth. Take 1 tablet (10 mg total) by mouth once daily as needed (take if weight increases by 2lbs in 1 day or 5lbs in 1 week from 150lbs.)        Follow-up Information     Merrie Roof, MD. Call.   Specialty: Cardiology Contact information: [place] [address] [phone]         Juline Patch, MD. Call.   Specialty: Family Medicine Contact information: [address] [place] [address] [phone]                 Discharge Instructions     Diet - low sodium heart healthy   Complete by: As directed    Discharge instructions   Complete by: As directed    From Dr. Loleta Books: You were admitted with blood in your urine.  Here, we found that your INR was over 8!  You got vitamin K to reverse the INR.  Given that this is your second time being admitted for bleeding in the last 4 months because of a high INR, we recommend you change bloodthinners.  STOP warfarin/coumadin  START apixaban/Eliquis 5 mg twice daily  Go see Dr. Jalene Mullet (or one of his partners) as soon as you can  It would probably be wise to go see Dr. Ronnald Ramp as well  Return to the hospital for any new bleeding, in the urine, bowel movements or elsewhere that you notice   Increase activity slowly   Complete by: As directed        Discharge Exam: Filed Weights   04/20/22 1509  Weight: 73 kg    General: Pt is alert, awake, not in  acute distress Cardiovascular: RRR, nl S1-S2, no murmurs appreciated.   No LE edema.   Respiratory: Normal respiratory rate and rhythm.  CTAB without rales or wheezes. Abdominal: Abdomen soft and non-tender.  No distension or HSM.   Neuro/Psych: Strength symmetric in upper and lower extremities.  Judgment and insight appear normal.   Condition at discharge: good  The results of significant diagnostics from this hospitalization (including imaging, microbiology, ancillary and laboratory) are listed below for reference.   Imaging Studies: CT ABDOMEN PELVIS WO CONTRAST  Result Date: 04/20/2022 CLINICAL DATA:  Right lower quadrant pain and hematuria EXAM: CT ABDOMEN AND PELVIS WITHOUT CONTRAST TECHNIQUE: Multidetector CT imaging of the abdomen and pelvis was performed following the standard protocol without IV contrast. RADIATION DOSE REDUCTION: This exam was performed according to the departmental dose-optimization program which includes automated exposure control, adjustment of the mA and/or kV according to patient size and/or use of iterative reconstruction technique. COMPARISON:  03/04/2020 FINDINGS: Lower chest: Scarring is noted in the right middle lobe stable from the prior exam. Hepatobiliary: Liver is within normal limits. Large gallstone is noted stable from the prior study. Pancreas: Unremarkable. No pancreatic ductal dilatation or surrounding inflammatory changes. Spleen: Normal in size without focal abnormality. Adrenals/Urinary Tract: Adrenal glands again demonstrate bilateral hypodense lesions likely representing adenomas. They have been stable for several years dating back to 2014 and consistent with a benign etiology. Vascular calcifications are noted in the kidneys bilaterally. Prominent extrarenal pelvis is again noted on the right. Bladder is partially distended. Stomach/Bowel: Postsurgical changes are noted in the mid transverse colon stable in appearance from the prior exam. No  obstructive changes are seen. Small bowel and stomach are unremarkable. Vascular/Lymphatic: Diffuse aortic calcifications are noted without aneurysmal dilatation. No lymphadenopathy is seen. Reproductive: Uterus is again well visualized with diffuse parenchymal calcifications. Stable 5.6 cm left ovarian cyst is noted unchanged from 2014 Other: No abdominal wall hernia or abnormality. No abdominopelvic ascites. Musculoskeletal: Degenerative changes of lumbar spine are noted. IMPRESSION: Cholelithiasis without complicating factors. Fullness of the right renal pelvis stable from the prior exam. No obstructing lesion is seen. Stable hypodense adrenal lesions dating back to 2014 consistent with adenomas. Stable left ovarian cyst Electronically Signed   By: Inez Catalina M.D.   On: 04/20/2022 23:08    Microbiology: Results for orders placed or performed during the hospital encounter of 02/08/22  Urine Culture     Status: None   Collection Time: 02/10/22  4:14 PM   Specimen: Urine, Random  Result Value Ref Range Status   Specimen Description   Final    URINE, RANDOM Performed at [facility], [address], [place], [place] [address]    Special Requests   Final    NONE Performed at [facility], [address], [place], [place] [address]    Culture   Final    NO GROWTH Performed at [facility], [place] [address], [place], [place] [address]    Report Status 02/12/2022 FINAL  Final    Labs: CBC: Recent Labs  Lab 04/20/22 1943 04/21/22 0445 04/21/22 1927 04/22/22 0402  WBC 7.3 8.4  --  6.2  HGB 10.7* 9.3* 9.6* 9.2*  HCT 34.5* 29.8* 29.7* 28.3*  MCV 92.5 90.3  --  89.3  PLT 325 275  --  233   Basic Metabolic Panel: Recent Labs  Lab 04/20/22 1943 04/21/22 0445 04/22/22 0402  NA 139 139 141  K 4.1 3.9 4.0  CL 109 109 113*  CO2 _0 GLUCOSE 98 96 126*  BUN _1 CREATININE 1.44* 1.37* 1.30*  CALCIUM 8.7* 8.3* 8.2*   Liver Function Tests: Recent  Labs  Lab 04/20/22 1943 04/21/22 0445  AST 21 23  ALT 28 28  ALKPHOS 73 59  BILITOT 0.6 0.5  PROT 6.6 5.9*  ALBUMIN 3.4* 3.0*   CBG: No results for input(s): "GLUCAP" in the last 168 hours.  Discharge time spent: approximately 35 minutes spent on discharge counseling, evaluation  of patient on day of discharge, and coordination of discharge planning with nursing, social work, pharmacy and case management  Signed: Edwin Dada, MD [facility] 04/22/2022

## 2022-04-23 ENCOUNTER — Ambulatory Visit: Payer: Self-pay | Admitting: *Deleted

## 2022-04-23 ENCOUNTER — Other Ambulatory Visit: Payer: Self-pay | Admitting: *Deleted

## 2022-04-23 NOTE — Telephone Encounter (Signed)
Summary: Medication Question, hosp fu   Pt's son called with questions regarding Eliquis, please advise. Pt was discharged yesterday for urine in blood and black stool.  Best contact: [phone]        Chief Complaint: medication question regarding eliquis Symptoms: none Frequency: today  Pertinent Negatives: Patient denies sx Disposition: '[]'$ ED /'[]'$ Urgent Care (no appt availability in office) / '[]'$ Appointment(In office/virtual)/ '[]'$  [facility]/ '[]'$ Home Care/ '[]'$ Refused Recommended Disposition /'[]'$ [facility]/ '[x]'$  Follow-up with PCP Additional Notes:   Patient's son calling to verify if he is to give patient eliquis 5 mg 2 times daily since discharged from hospital. Patient was on coumadin and switched. Patient's son asking if INR needs to be checked. Reviewed with patient's son orders to give eliquis 5 mg 2 times daily and not to give coumadin. PCP will f/u when INR will need to be checked. Since patient not taking coumadin no INR will need to be scheduled unless PCP requesting. Patient's son denies patient has had blood in stool or urine today . Please advise. Appt already scheduled for 04/26/22.         Reason for Disposition  [1] Caller has URGENT medicine question about med that PCP or specialist prescribed AND [2] triager unable to answer question  Answer Assessment - Initial Assessment Questions 1. NAME of MEDICINE: "What medicine(s) are you calling about?"     Eliquis 5 mg  2. QUESTION: "What is your question?" (e.g., double dose of medicine, side effect)     Is the patient supposed to be taking eliquis 5 mg 2 times daily and does not need INR checked? 3. PRESCRIBER: "Who prescribed the medicine?" Reason: if prescribed by specialist, call should be referred to that group.     ED 4. SYMPTOMS: "Do you have any symptoms?" If Yes, ask: "What symptoms are you having?"  "How bad are the symptoms (e.g., mild, moderate, severe)     No  5. PREGNANCY:  "Is there any  chance that you are pregnant?" "When was your last menstrual period?"     na  Protocols used: Medication Question Call-A-AH

## 2022-04-23 NOTE — Patient Outreach (Signed)
  Care Coordination [facility] Note Transition Care Management Follow-up Telephone Call Date of discharge and from where: [facility] [phone] How have you been since you were released from the hospital? Doing ok Any questions or concerns? No  Items Reviewed: Did the pt receive and understand the discharge instructions provided? Yes  Medications obtained and verified? Yes  Other? No  Any new allergies since your discharge? No  Dietary orders reviewed? Yes Do you have support at home? Yes   Home Care and Equipment/Supplies: Were home health services ordered? no If so, what is the name of the agency? n  Has the agency set up a time to come to the patient's home? not applicable Were any new equipment or medical supplies ordered?  No What is the name of the medical supply agency? N/a Were you able to get the supplies/equipment? not applicable Do you have any questions related to the use of the equipment or supplies? N  Functional Questionnaire: (I = Independent and D = Dependent) ADLs: I  Bathing/Dressing- I  Meal Prep- D  Eating- I  Maintaining continence- I  Transferring/Ambulation- I  Managing Meds- d  Follow up appointments reviewed:  PCP Hospital f/u appt confirmed? No   Specialist Hospital f/u appt confirmed? No   Are transportation arrangements needed? No  If their condition worsens, is the pt aware to call PCP or go to the Emergency Dept.? Yes Was the patient provided with contact information for the PCP's office or ED? Yes Was to pt encouraged to call back with questions or concerns? Yes  SDOH assessments and interventions completed:   Yes  Care Coordination Interventions Activated:  Yes   Care Coordination Interventions:  PCP follow up appointment requested    Encounter Outcome:  Pt. Visit Completed    [facility] [phone]

## 2022-04-26 ENCOUNTER — Encounter: Payer: Self-pay | Admitting: Family Medicine

## 2022-04-26 ENCOUNTER — Ambulatory Visit (INDEPENDENT_AMBULATORY_CARE_PROVIDER_SITE_OTHER): Payer: Medicare HMO | Admitting: Family Medicine

## 2022-04-26 VITALS — BP 112/80 | HR 76 | Ht 64.0 in | Wt 165.0 lb

## 2022-04-26 DIAGNOSIS — I4891 Unspecified atrial fibrillation: Secondary | ICD-10-CM | POA: Diagnosis not present

## 2022-04-26 DIAGNOSIS — Z7901 Long term (current) use of anticoagulants: Secondary | ICD-10-CM

## 2022-04-26 DIAGNOSIS — Z8719 Personal history of other diseases of the digestive system: Secondary | ICD-10-CM

## 2022-04-26 NOTE — Progress Notes (Signed)
Date:  04/26/2022   Name:  Veronica Ellis   DOB:  04-Oct-1933   MRN:  [id number]   Chief Complaint: Hospitalization Follow-up  Heart Problem This is a chronic (for atrial fibrillation) problem. The current episode started more than 1 year ago. The problem occurs intermittently. The problem has been gradually improving. Pertinent negatives include no abdominal pain, change in bowel habit, chest pain, congestion, coughing or nausea.    Lab Results  Component Value Date   NA 141 04/22/2022   K 4.0 04/22/2022   CO2 25 04/22/2022   GLUCOSE 126 (H) 04/22/2022   BUN 19 04/22/2022   CREATININE 1.30 (H) 04/22/2022   CALCIUM 8.2 (L) 04/22/2022   GFRNONAA 40 (L) 04/22/2022   Lab Results  Component Value Date   CHOL 129 05/19/2020   HDL 52 05/19/2020   LDLCALC 56 05/19/2020   TRIG 120 05/19/2020   CHOLHDL 3.6 05/20/2018   Lab Results  Component Value Date   TSH 3.246 02/11/2022   Lab Results  Component Value Date   HGBA1C 5.4 05/19/2018   Lab Results  Component Value Date   WBC 6.2 04/22/2022   HGB 9.2 (L) 04/22/2022   HCT 28.3 (L) 04/22/2022   MCV 89.3 04/22/2022   PLT 265 04/22/2022   Lab Results  Component Value Date   ALT 28 04/21/2022   AST 23 04/21/2022   ALKPHOS 59 04/21/2022   BILITOT 0.5 04/21/2022   No results found for: "25OHVITD2", "25OHVITD3", "VD25OH"   Review of Systems  HENT:  Negative for congestion.   Respiratory:  Positive for wheezing. Negative for cough and shortness of breath.   Cardiovascular:  Negative for chest pain.  Gastrointestinal:  Negative for abdominal pain, blood in stool, change in bowel habit, constipation and nausea.    Patient Active Problem List   Diagnosis Date Noted   GI bleed 04/21/2022   Gross hematuria 04/21/2022   Essential hypertension 04/21/2022   Symptomatic ABLA due to GIB in the setting of supratherapeutic INR 02/09/2022   Supratherapeutic INR 02/09/2022   Goals of care, counseling/discussion 02/09/2022    Paroxysmal A-fib ([place]) 02/09/2022   Urinary tract infection 02/09/2022   Generalized weakness 02/09/2022   GI bleeding 02/08/2022   Coagulopathy/supratherapeutic INR on warfarin 02/08/2022   CKD-3A 02/08/2022   Dyslipidemia 02/08/2022   Depression 02/08/2022   Hypotension 02/08/2022   GERD without esophagitis 08/31/2019   History of IBS 09/26/2018   Long term (current) use of anticoagulants 07/17/2018   3-vessel CAD 07/08/2018   Acute on chronic combined systolic and diastolic CHF (congestive heart failure) ([place]) 07/08/2018   Ischemic cardiomyopathy 07/08/2018   AKI (acute kidney injury) ([place]) 06/06/2018   Hyperkalemia 06/05/2018   Atrial fibrillation with RVR ([place]) 05/21/2018   Symptomatic anemia 05/21/2018   NSTEMI (non-ST elevated myocardial infarction) ([place]) 68/08/7516   Chronic systolic CHF (congestive heart failure) ([place]) 07/30/2017   LBBB (left bundle branch block) 07/10/2017   Lumbar radiculopathy 05/16/2017   Hx of low back pain 05/14/2017   Ataxia 12/31/2016   Memory loss 12/31/2016   Recurrent major depressive disorder, in partial remission ([place]) 12/31/2016   Bipolar 1 disorder, mixed, moderate ([place]) 12/25/2016   Chronic bilateral low back pain with bilateral sciatica 10/18/2016   Bilateral breast cancer ([place]) 02/20/2016   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   Cerebrovascular disease 02/16/2015   Anxiety and depression 02/16/2015   Breast lump 02/16/2015   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[place]) 02/16/2015   Tobacco abuse, in remission  02/16/2015   Routine general medical examination at a health care facility 02/16/2015   Recurrent major depressive episodes ([place]) 02/16/2015   Below normal amount of sodium in the blood 02/16/2015   Pre-operative cardiovascular examination 02/16/2015    Allergies  Allergen Reactions   Iodine Nausea Only    Betadine okay    Erythromycin Rash   Other Rash   Penicillins Rash    Has patient had a PCN reaction  causing immediate rash, facial/tongue/throat swelling, SOB or lightheadedness with hypotension: No Has patient had a PCN reaction causing severe rash involving mucus membranes or skin necrosis: No Has patient had a PCN reaction that required hospitalization: No Has patient had a PCN reaction occurring within the last 10 years: No If all of the above answers are "NO", then may proceed with Cephalosporin use.    Shellfish Allergy Rash    Past Surgical History:  Procedure Laterality Date   APPENDECTOMY     BREAST BIOPSY Bilateral 2014   BREAST LUMPECTOMY Bilateral 05/2013   BREAST SURGERY Bilateral    CATARACT EXTRACTION W/PHACO Right 12/11/2017   Procedure: CATARACT EXTRACTION PHACO AND INTRAOCULAR LENS PLACEMENT ([place]) COMPLICATED RIGHT;  Surgeon: Leandrew Koyanagi, MD;  Location: [facility];  Service: Ophthalmology;  Laterality: Right;   COLON SURGERY     12 in. removed due to polyps   COLONOSCOPY WITH PROPOFOL N/A 02/10/2022   Procedure: COLONOSCOPY WITH PROPOFOL;  Surgeon: Lin Landsman, MD;  [facility] ENDOSCOPY;  Service: Gastroenterology;  Laterality: N/A;   ESOPHAGOGASTRODUODENOSCOPY (EGD) WITH PROPOFOL N/A 02/09/2022   Procedure: ESOPHAGOGASTRODUODENOSCOPY (EGD) WITH PROPOFOL;  Surgeon: Jonathon Bellows, MD;  Location: [facility] ENDOSCOPY;  Service: Gastroenterology;  Laterality: N/A;   GIVENS CAPSULE STUDY N/A 02/11/2022   Procedure: GIVENS CAPSULE STUDY;  Surgeon: Lin Landsman, MD;  Location[facility] ENDOSCOPY;  Service: Gastroenterology;  Laterality: N/A;   LEFT HEART CATH AND CORONARY ANGIOGRAPHY N/A 05/20/2018   Procedure: LEFT HEART CATH AND CORONARY ANGIOGRAPHY;  Surgeon: Corey Skains, MD;  Location: [facility];  Service: Cardiovascular;  Laterality: N/A;   TUBAL LIGATION      Social History   Tobacco Use   Smoking status: Former    Packs/day: 1.00    Years: 60.00    Total pack years: 60.00    Types: Cigarettes    Quit date: 12/2017    Years  since quitting: 4.3   Smokeless tobacco: Never   Tobacco comments:    smoking cessation materials not required  Vaping Use   Vaping Use: Never used  Substance Use Topics   Alcohol use: Not Currently    Alcohol/week: 0.0 standard drinks of alcohol   Drug use: No     Medication list has been reviewed and updated.  Current Meds  Medication Sig   apixaban (ELIQUIS) 5 MG TABS tablet Take 1 tablet (5 mg total) by mouth 2 (two) times daily.   atorvastatin (LIPITOR) 80 MG tablet Take 80 mg by mouth at bedtime.   buPROPion (WELLBUTRIN) 100 MG tablet Take 1 tablet (100 mg total) by mouth 2 (two) times daily.   ezetimibe (ZETIA) 10 MG tablet Take by mouth. Take 1 tablet (10 mg total) by mouth once daily   magnesium oxide (MAG-OX) 400 MG tablet Take 1 tablet by mouth daily.   metoprolol succinate (TOPROL-XL) 25 MG 24 hr tablet Take 0.5 tablets (12.5 mg total) by mouth daily.   nitroGLYCERIN (NITROSTAT) 0.4 MG SL tablet Place 0.4 mg under the tongue every  5 (five) minutes as needed for chest pain.   omeprazole (PRILOSEC) 40 MG capsule Take 1 capsule (40 mg total) by mouth 2 (two) times daily.   sacubitril-valsartan (ENTRESTO) 24-26 MG Take 1 tablet by mouth 2 (two) times daily.   spironolactone (ALDACTONE) 25 MG tablet Take 25 mg by mouth at bedtime.   torsemide (DEMADEX) 10 MG tablet Take by mouth. Take 1 tablet (10 mg total) by mouth once daily as needed (take if weight increases by 2lbs in 1 day or 5lbs in 1 week from 150lbs.)       04/26/2022    2:49 PM 02/15/2022    1:33 PM 12/21/2021    1:17 PM 06/22/2021    1:21 PM  GAD 7 : Generalized Anxiety Score  Nervous, Anxious, on Edge 1 0 1 1  Control/stop worrying 1 0 0 1  Worry too much - different things 1 0 0 1  Trouble relaxing 0 0 0 2  Restless 0 0 0 2  Easily annoyed or irritable 0 0 0 2  Afraid - awful might happen 0 0 0 0  Total GAD 7 Score 3 0 1 9  Anxiety Difficulty Not difficult at all Not difficult at all Not difficult at all  Not difficult at all       04/26/2022    2:49 PM 02/15/2022    1:33 PM 01/03/2022    3:40 PM  Depression screen PHQ 2/9  Decreased Interest 0 0 2  Down, Depressed, Hopeless 0 0 2  PHQ - 2 Score 0 0 4  Altered sleeping 0 1 2  Tired, decreased energy 0 0 0  Change in appetite 0 0 0  Feeling bad or failure about yourself  0 1 0  Trouble concentrating 1 0 0  Moving slowly or fidgety/restless 3 1 0  Suicidal thoughts 0 0 0  PHQ-9 Score '4 3 6  '$ Difficult doing work/chores Extremely dIfficult Somewhat difficult Somewhat difficult    BP Readings from Last 3 Encounters:  04/26/22 112/80  04/22/22 (!) 99/50  02/26/22 137/77    Physical Exam Vitals and nursing note reviewed. Exam conducted with a chaperone present.  Constitutional:      General: She is not in acute distress.    Appearance: She is not diaphoretic.  HENT:     Head: Normocephalic and atraumatic.     Right Ear: Tympanic membrane and external ear normal.     Left Ear: Tympanic membrane and external ear normal.     Nose: Nose normal. No congestion.  Eyes:     General:        Right eye: No discharge.        Left eye: No discharge.     Conjunctiva/sclera: Conjunctivae normal.     Pupils: Pupils are equal, round, and reactive to light.  Neck:     Thyroid: No thyromegaly.     Vascular: No JVD.  Cardiovascular:     Rate and Rhythm: Normal rate.     Heart sounds: Normal heart sounds. No murmur heard.    No gallop.  Pulmonary:     Effort: Pulmonary effort is normal.     Breath sounds: Normal breath sounds. No wheezing or rhonchi.  Abdominal:     General: Bowel sounds are normal.     Palpations: Abdomen is soft. There is no mass.     Tenderness: There is no abdominal tenderness. There is no guarding or rebound.  Musculoskeletal:  General: Normal range of motion.     Cervical back: Neck supple.  Lymphadenopathy:     Cervical: No cervical adenopathy.  Skin:    General: Skin is warm and dry.  Neurological:      Mental Status: She is alert.     Deep Tendon Reflexes: Reflexes are normal and symmetric.     Wt Readings from Last 3 Encounters:  04/26/22 165 lb (74.8 kg)  04/20/22 161 lb (73 kg)  02/26/22 162 lb (73.5 kg)    BP 112/80   Pulse 76   Ht '5\' 4"'$  (1.626 m)   Wt 165 lb (74.8 kg)   BMI 28.32 kg/m   Assessment and Plan:  1. Atrial fibrillation with RVR [facility]) Patient with atrial fibrillation followed by cardiology.  Patient initially was on warfarin however based was the second supra therapeutic hospitalization necessitating vitamin K in the last couple weeks.  Currently atrial fibrillation is rate controlled and we will call cardiology service she can have follow-up for this as well as her history of chronic systolic   Congestive heart failure.  2. On continuous oral anticoagulation Chronic.  Persistent.  Stable today.  As noted previous patient was on Coumadin however for what ever reason it became increasingly more more difficult to keep patient in therapeutic range because of ease of dosing or missing dosings or what ever in the meantime patient was hospitalized with her second supratherapeutic episode and was discharged on Eliquis.  This was explained to the patient and her son that it was necessary for her to be on this dosing of Eliquis 5 mg 1 twice a day and this will be followed by her cardiologist at [facility]. - CBC with Differential/Platelet  3. History of GI bleed This is a second perhaps third episode GI bleed-upper GI nature necessitating hospitalization.  We will check CBC to see if this is stabilized and will proceed accordingly. - CBC with Differential/Platelet    Otilio Miu, MD

## 2022-04-27 LAB — CBC WITH DIFFERENTIAL/PLATELET
Basophils Absolute: 0.1 10*3/uL (ref 0.0–0.2)
Basos: 1 %
EOS (ABSOLUTE): 0.4 10*3/uL (ref 0.0–0.4)
Eos: 6 %
Hematocrit: 33.1 % — ABNORMAL LOW (ref 34.0–46.6)
Hemoglobin: 10.8 g/dL — ABNORMAL LOW (ref 11.1–15.9)
Immature Grans (Abs): 0 10*3/uL (ref 0.0–0.1)
Immature Granulocytes: 0 %
Lymphocytes Absolute: 2 10*3/uL (ref 0.7–3.1)
Lymphs: 27 %
MCH: 29.3 pg (ref 26.6–33.0)
MCHC: 32.6 g/dL (ref 31.5–35.7)
MCV: 90 fL (ref 79–97)
Monocytes Absolute: 0.7 10*3/uL (ref 0.1–0.9)
Monocytes: 9 %
Neutrophils Absolute: 4.2 10*3/uL (ref 1.4–7.0)
Neutrophils: 57 %
Platelets: 360 10*3/uL (ref 150–450)
RBC: 3.68 x10E6/uL — ABNORMAL LOW (ref 3.77–5.28)
RDW: 13.8 % (ref 11.7–15.4)
WBC: 7.4 10*3/uL (ref 3.4–10.8)

## 2022-06-22 ENCOUNTER — Ambulatory Visit: Payer: Medicare HMO | Admitting: Family Medicine

## 2022-07-03 ENCOUNTER — Ambulatory Visit: Payer: Self-pay | Admitting: *Deleted

## 2022-07-03 ENCOUNTER — Other Ambulatory Visit: Payer: Self-pay | Admitting: Family Medicine

## 2022-07-03 ENCOUNTER — Ambulatory Visit (INDEPENDENT_AMBULATORY_CARE_PROVIDER_SITE_OTHER): Payer: Medicare HMO | Admitting: Family Medicine

## 2022-07-03 ENCOUNTER — Encounter: Payer: Self-pay | Admitting: Family Medicine

## 2022-07-03 ENCOUNTER — Telehealth: Payer: Self-pay | Admitting: Family Medicine

## 2022-07-03 VITALS — BP 90/60 | HR 68 | Ht 64.0 in | Wt 164.0 lb

## 2022-07-03 DIAGNOSIS — Z23 Encounter for immunization: Secondary | ICD-10-CM | POA: Diagnosis not present

## 2022-07-03 DIAGNOSIS — F3341 Major depressive disorder, recurrent, in partial remission: Secondary | ICD-10-CM

## 2022-07-03 DIAGNOSIS — K219 Gastro-esophageal reflux disease without esophagitis: Secondary | ICD-10-CM

## 2022-07-03 DIAGNOSIS — Z862 Personal history of diseases of the blood and blood-forming organs and certain disorders involving the immune mechanism: Secondary | ICD-10-CM

## 2022-07-03 MED ORDER — OMEPRAZOLE 40 MG PO CPDR: 40.0000 mg | DELAYED_RELEASE_CAPSULE | Freq: Two times a day (BID) | ORAL | 1 refills | Status: DC

## 2022-07-03 MED ORDER — BUPROPION HCL 100 MG PO TABS: 100.0000 mg | ORAL_TABLET | Freq: Two times a day (BID) | ORAL | 1 refills | Status: DC

## 2022-07-03 NOTE — Telephone Encounter (Signed)
Pt has an appt today.  KP 

## 2022-07-03 NOTE — Progress Notes (Signed)
Date:  07/03/2022   Name:  Veronica Ellis   DOB:  April 25, 1934   MRN:  [id number]   Chief Complaint: Gastroesophageal Reflux, Depression, low hemaglobin, and Flu Vaccine  Gastroesophageal Reflux She complains of dysphagia. She reports no abdominal pain, no chest pain, no coughing, no heartburn, no nausea, no sore throat or no wheezing. This is a chronic problem. The current episode started more than 1 year ago. The problem occurs frequently. The problem has been gradually improving. Pertinent negatives include no fatigue or melena. She has tried a PPI for the symptoms. The treatment provided mild relief. Past procedures do not include an abdominal ultrasound, an EGD, esophageal manometry, esophageal pH monitoring, H. pylori antibody titer or a UGI.  Depression        This is a chronic problem.  The onset quality is gradual.   The problem has been gradually improving since onset.  Associated symptoms include no decreased concentration, no fatigue, no helplessness, no hopelessness, does not have insomnia, not irritable, no restlessness, no decreased interest, no appetite change, no body aches, no myalgias, no headaches, no indigestion, not sad and no suicidal ideas.  Past treatments include SSRIs - Selective serotonin reuptake inhibitors.  Compliance with treatment is good.   Lab Results  Component Value Date   NA 141 04/22/2022   K 4.0 04/22/2022   CO2 25 04/22/2022   GLUCOSE 126 (H) 04/22/2022   BUN 19 04/22/2022   CREATININE 1.30 (H) 04/22/2022   CALCIUM 8.2 (L) 04/22/2022   GFRNONAA 40 (L) 04/22/2022   Lab Results  Component Value Date   CHOL 129 05/19/2020   HDL 52 05/19/2020   LDLCALC 56 05/19/2020   TRIG 120 05/19/2020   CHOLHDL 3.6 05/20/2018   Lab Results  Component Value Date   TSH 3.246 02/11/2022   Lab Results  Component Value Date   HGBA1C 5.4 05/19/2018   Lab Results  Component Value Date   WBC 7.4 04/26/2022   HGB 10.8 (L) 04/26/2022   HCT 33.1 (L)  04/26/2022   MCV 90 04/26/2022   PLT 360 04/26/2022   Lab Results  Component Value Date   ALT 28 04/21/2022   AST 23 04/21/2022   ALKPHOS 59 04/21/2022   BILITOT 0.5 04/21/2022   No results found for: "25OHVITD2", "25OHVITD3", "VD25OH"   Review of Systems  Constitutional:  Negative for appetite change, chills, fatigue and fever.  HENT:  Negative for drooling, ear discharge, ear pain and sore throat.   Respiratory:  Negative for cough, shortness of breath and wheezing.   Cardiovascular:  Negative for chest pain, palpitations and leg swelling.  Gastrointestinal:  Positive for dysphagia. Negative for abdominal pain, blood in stool, constipation, diarrhea, heartburn, melena and nausea.  Endocrine: Negative for polydipsia.  Genitourinary:  Negative for dysuria, frequency, hematuria and urgency.  Musculoskeletal:  Negative for back pain, myalgias and neck pain.  Skin:  Negative for rash.  Allergic/Immunologic: Negative for environmental allergies.  Neurological:  Negative for dizziness and headaches.  Hematological:  Does not bruise/bleed easily.  Psychiatric/Behavioral:  Positive for depression. Negative for decreased concentration and suicidal ideas. The patient is not nervous/anxious and does not have insomnia.     Patient Active Problem List   Diagnosis Date Noted   GI bleed 04/21/2022   Gross hematuria 04/21/2022   Essential hypertension 04/21/2022   Symptomatic ABLA due to GIB in the setting of supratherapeutic INR 02/09/2022   Supratherapeutic INR 02/09/2022   Goals of care, counseling/discussion  02/09/2022   Paroxysmal A-fib (HCC) 02/09/2022   Urinary tract infection 02/09/2022   Generalized weakness 02/09/2022   GI bleeding 02/08/2022   Coagulopathy/supratherapeutic INR on warfarin 02/08/2022   CKD-3A 02/08/2022   Dyslipidemia 02/08/2022   Depression 02/08/2022   Hypotension 02/08/2022   GERD without esophagitis 08/31/2019   History of IBS 09/26/2018   Long term  (current) use of anticoagulants 07/17/2018   3-vessel CAD 07/08/2018   Acute on chronic combined systolic and diastolic CHF (congestive heart failure) ([place]) 07/08/2018   Ischemic cardiomyopathy 07/08/2018   AKI (acute kidney injury) ([place]) 06/06/2018   Hyperkalemia 06/05/2018   Atrial fibrillation with RVR ([place]) 05/21/2018   Symptomatic anemia 05/21/2018   NSTEMI (non-ST elevated myocardial infarction) ([place]) 93/71/6967   Chronic systolic CHF (congestive heart failure) ([place]) 07/30/2017   LBBB (left bundle branch block) 07/10/2017   Lumbar radiculopathy 05/16/2017   Hx of low back pain 05/14/2017   Ataxia 12/31/2016   Memory loss 12/31/2016   Recurrent major depressive disorder, in partial remission ([place]) 12/31/2016   Bipolar 1 disorder, mixed, moderate ([place]) 12/25/2016   Chronic bilateral low back pain with bilateral sciatica 10/18/2016   Bilateral breast cancer ([place]) 02/20/2016   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   Cerebrovascular disease 02/16/2015   Anxiety and depression 02/16/2015   Breast lump 02/16/2015   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[place]) 02/16/2015   Tobacco abuse, in remission 02/16/2015   Routine general medical examination at a health care facility 02/16/2015   Recurrent major depressive episodes ([place]) 02/16/2015   Below normal amount of sodium in the blood 02/16/2015   Pre-operative cardiovascular examination 02/16/2015    Allergies  Allergen Reactions   Iodine Nausea Only    Betadine okay    Erythromycin Rash   Other Rash   Penicillins Rash    Has patient had a PCN reaction causing immediate rash, facial/tongue/throat swelling, SOB or lightheadedness with hypotension: No Has patient had a PCN reaction causing severe rash involving mucus membranes or skin necrosis: No Has patient had a PCN reaction that required hospitalization: No Has patient had a PCN reaction occurring within the last 10 years: No If all of the above answers are "NO", then  may proceed with Cephalosporin use.    Shellfish Allergy Rash    Past Surgical History:  Procedure Laterality Date   APPENDECTOMY     BREAST BIOPSY Bilateral 2014   BREAST LUMPECTOMY Bilateral 05/2013   BREAST SURGERY Bilateral    CATARACT EXTRACTION W/PHACO Right 12/11/2017   Procedure: CATARACT EXTRACTION PHACO AND INTRAOCULAR LENS PLACEMENT ([place]) COMPLICATED RIGHT;  Surgeon: Leandrew Koyanagi, MD;  Location: [facility];  Service: Ophthalmology;  Laterality: Right;   COLON SURGERY     12 in. removed due to polyps   COLONOSCOPY WITH PROPOFOL N/A 02/10/2022   Procedure: COLONOSCOPY WITH PROPOFOL;  Surgeon: Lin Landsman, MD;  Location: [facility] ENDOSCOPY;  Service: Gastroenterology;  Laterality: N/A;   ESOPHAGOGASTRODUODENOSCOPY (EGD) WITH PROPOFOL N/A 02/09/2022   Procedure: ESOPHAGOGASTRODUODENOSCOPY (EGD) WITH PROPOFOL;  Surgeon: Jonathon Bellows, MD;  Location: [facility] ENDOSCOPY;  Service: Gastroenterology;  Laterality: N/A;   GIVENS CAPSULE STUDY N/A 02/11/2022   Procedure: GIVENS CAPSULE STUDY;  Surgeon: Lin Landsman, MD;  Location: [facility] ENDOSCOPY;  Service: Gastroenterology;  Laterality: N/A;   LEFT HEART CATH AND CORONARY ANGIOGRAPHY N/A 05/20/2018   Procedure: LEFT HEART CATH AND CORONARY ANGIOGRAPHY;  Surgeon: Corey Skains, MD;  Location: [facility];  Service: Cardiovascular;  Laterality: N/A;   TUBAL LIGATION  Social History   Tobacco Use   Smoking status: Former    Packs/day: 1.00    Years: 60.00    Total pack years: 60.00    Types: Cigarettes    Quit date: 12/2017    Years since quitting: 4.5   Smokeless tobacco: Never   Tobacco comments:    smoking cessation materials not required  Vaping Use   Vaping Use: Never used  Substance Use Topics   Alcohol use: Not Currently    Alcohol/week: 0.0 standard drinks of alcohol   Drug use: No     Medication list has been reviewed and updated.  Current Meds  Medication Sig   apixaban (ELIQUIS)  5 MG TABS tablet Take 1 tablet (5 mg total) by mouth 2 (two) times daily.   atorvastatin (LIPITOR) 80 MG tablet Take 80 mg by mouth at bedtime.   buPROPion (WELLBUTRIN) 100 MG tablet Take 1 tablet (100 mg total) by mouth 2 (two) times daily.   ezetimibe (ZETIA) 10 MG tablet Take by mouth. Take 1 tablet (10 mg total) by mouth once daily   magnesium oxide (MAG-OX) 400 MG tablet Take 1 tablet by mouth daily.   metoprolol succinate (TOPROL-XL) 25 MG 24 hr tablet Take 0.5 tablets (12.5 mg total) by mouth daily.   nitroGLYCERIN (NITROSTAT) 0.4 MG SL tablet Place 0.4 mg under the tongue every 5 (five) minutes as needed for chest pain.   omeprazole (PRILOSEC) 40 MG capsule Take 1 capsule (40 mg total) by mouth 2 (two) times daily.   sacubitril-valsartan (ENTRESTO) 24-26 MG Take 1 tablet by mouth 2 (two) times daily.   spironolactone (ALDACTONE) 25 MG tablet Take 25 mg by mouth at bedtime.   torsemide (DEMADEX) 10 MG tablet Take by mouth. Take 1 tablet (10 mg total) by mouth once daily as needed (take if weight increases by 2lbs in 1 day or 5lbs in 1 week from 150lbs.)       07/03/2022    1:19 PM 04/26/2022    2:49 PM 02/15/2022    1:33 PM 12/21/2021    1:17 PM  GAD 7 : Generalized Anxiety Score  Nervous, Anxious, on Edge 0 1 0 1  Control/stop worrying 0 1 0 0  Worry too much - different things 0 1 0 0  Trouble relaxing 0 0 0 0  Restless 0 0 0 0  Easily annoyed or irritable 0 0 0 0  Afraid - awful might happen 0 0 0 0  Total GAD 7 Score 0 3 0 1  Anxiety Difficulty Not difficult at all Not difficult at all Not difficult at all Not difficult at all       07/03/2022    1:18 PM 04/26/2022    2:49 PM 02/15/2022    1:33 PM  Depression screen PHQ 2/9  Decreased Interest 0 0 0  Down, Depressed, Hopeless 0 0 0  PHQ - 2 Score 0 0 0  Altered sleeping 2 0 1  Tired, decreased energy 0 0 0  Change in appetite 0 0 0  Feeling bad or failure about yourself  0 0 1  Trouble concentrating 0 1 0  Moving  slowly or fidgety/restless 0 3 1  Suicidal thoughts 0 0 0  PHQ-9 Score '2 4 3  '$ Difficult doing work/chores Not difficult at all Extremely dIfficult Somewhat difficult    BP Readings from Last 3 Encounters:  07/03/22 90/60  04/26/22 112/80  04/22/22 (!) 99/50    Physical Exam Vitals and nursing  note reviewed. Exam conducted with a chaperone present.  Constitutional:      General: She is not irritable.She is not in acute distress.    Appearance: She is not diaphoretic.  HENT:     Head: Normocephalic and atraumatic.     Jaw: There is normal jaw occlusion.     Right Ear: Tympanic membrane and external ear normal.     Left Ear: Tympanic membrane and external ear normal.     Nose: Nose normal.     Mouth/Throat:     Lips: Pink.     Mouth: Mucous membranes are moist.     Pharynx: Oropharynx is clear.  Eyes:     General:        Right eye: No discharge.        Left eye: No discharge.     Conjunctiva/sclera: Conjunctivae normal.     Right eye: Right conjunctiva is not injected.     Left eye: Left conjunctiva is not injected.     Pupils: Pupils are equal, round, and reactive to light.     Comments: No pale conjunctiva.  Neck:     Thyroid: No thyromegaly.     Vascular: No JVD.  Cardiovascular:     Rate and Rhythm: Normal rate and regular rhythm.     Heart sounds: Normal heart sounds. No murmur heard.    No friction rub. No gallop.  Pulmonary:     Effort: Pulmonary effort is normal.     Breath sounds: Normal breath sounds. No wheezing or rhonchi.  Abdominal:     General: Bowel sounds are normal.     Palpations: Abdomen is soft. There is no mass.     Tenderness: There is no abdominal tenderness. There is no guarding.  Musculoskeletal:        General: Normal range of motion.     Cervical back: Normal range of motion and neck supple.  Lymphadenopathy:     Cervical: No cervical adenopathy.  Skin:    General: Skin is warm and dry.  Neurological:     Mental Status: She is alert.      Deep Tendon Reflexes: Reflexes are normal and symmetric.     Wt Readings from Last 3 Encounters:  07/03/22 164 lb (74.4 kg)  04/26/22 165 lb (74.8 kg)  04/20/22 161 lb (73 kg)    BP 90/60   Pulse 68   Ht '5\' 4"'$  (1.626 m)   Wt 164 lb (74.4 kg)   BMI 28.15 kg/m   Assessment and Plan:  1. Recurrent major depressive disorder, in partial remission (HCC) Chronic.  Controlled.  Stable.  PHQ is 2.  GAD score is 0.  Patient is to continue Wellbutrin 100 mg twice a day.  We will recheck patient in 6 months.  2. GERD without esophagitis Chronic.  Controlled.  Stable.  Continue omeprazole 40 mg 1 twice a day.  Patient is having some difficulty with swallowing but review of endoscopy is normal esophagus stomach and duodenum. 3.  History of anemia last hemoglobin was noted to be 10.8 previously was 9.2 and 9.6 prior patient has a history of a GI bleed and we will obtain a hemoglobin before reinitiating Eliquis 5 mg twice a day samples. Patient is having some issues obtaining Eliquis 5 mg 1 twice a day.  We will call to find out what the hold-up is and being able to obtain medication on a regular basis.  We will check a hemoglobin prior to initiation of  samples. Otilio Miu, MD

## 2022-07-03 NOTE — Telephone Encounter (Signed)
Medication Refill - Medication: sacubitril-valsartan (ENTRESTO) 24-26 MG  Has the patient contacted their pharmacy? No.  Preferred Pharmacy (with phone number or street name):  [id number] [place]:  [phone] Fax:  [phone]     Has the patient been seen for an appointment in the last year OR does the patient have an upcoming appointment? Yes.    Agent: Please be advised that RX refills may take up to 3 business days. We ask that you follow-up with your pharmacy.

## 2022-07-03 NOTE — Telephone Encounter (Signed)
Copied from [place] [id number]. Topic: General - Inquiry >> Jul 03, 2022 11:08 AM Tiffany B wrote: Reason for CRM: patient returning PCP nurse call.

## 2022-07-03 NOTE — Telephone Encounter (Signed)
Summary: discuss medication   Caller states pt has not taken eliquis in 2 wks due to insurance not covering medication cost   Caller inquiring if he can give pt warfarin   Caller is not on dpr   Please advise        Chief Complaint: patient's son Veronica Ellis not listed on DPR. Son reports he is caregiver for patient not [name]. Son reports patient has not been sent eliquis from [place] due to [facility] not covering until speaking to a Dr. Pandora Leiter reports ordered by a MD while patient in hospital.  Symptoms: none at this time  Frequency: has not taken medication in 2 weeks  Pertinent Negatives: Patient denies na Disposition: '[]'$ ED /'[]'$ Urgent Care (no appt availability in office) / '[]'$ Appointment(In office/virtual)/ '[]'$  [facility]/ '[]'$ Home Care/ '[]'$ Refused Recommended Disposition /'[]'$ [facility]/ '[x]'$  Follow-up with PCP Additional Notes:   Appt scheduled for today with PCP. Please advise adding patient's son , Veronica Ellis on [facility] list. Please advise which provider patient can get approval for eliquis to notify insurance.     Reason for Disposition  [1] Caller has URGENT medicine question about med that PCP or specialist prescribed AND [2] triager unable to answer question  Answer Assessment - Initial Assessment Questions 1. NAME of MEDICINE: "What medicine(s) are you calling about?"     eliquis 2. QUESTION: "What is your question?" (e.g., double dose of medicine, side effect)    What should patient take due to insurance [employer] not covering medication and patient has not taken in 2 weeks?  3. PRESCRIBER: "Who prescribed the medicine?" Reason: if prescribed by specialist, call should be referred to that group.     MD in hospital  4. SYMPTOMS: "Do you have any symptoms?" If Yes, ask: "What symptoms are you having?"  "How bad are the symptoms (e.g., mild, moderate, severe)     na 5. PREGNANCY:  "Is there any chance that you are pregnant?" "When was  your last menstrual period?"     na  Protocols used: Medication Question Call-A-AH

## 2022-07-04 NOTE — Telephone Encounter (Signed)
Requested medication (s) are due for refill today: historical medication  Requested medication (s) are on the active medication list: yes  Last refill:  03/26/22  Future visit scheduled: yes in 5 months  Notes to clinic:  historical medication . Do you want to order Rx?     Requested Prescriptions  Pending Prescriptions Disp Refills   sacubitril-valsartan (ENTRESTO) 24-26 MG 60 tablet     Sig: Take 1 tablet by mouth 2 (two) times daily.     Off-Protocol Failed - 07/03/2022 11:38 AM      Failed - Medication not assigned to a protocol, review manually.      Passed - Valid encounter within last 12 months    Recent Outpatient Visits           Yesterday Recurrent major depressive disorder, in partial remission ([place])   [facility] [place], [place], MD   2 months ago Atrial fibrillation with RVR [facility])   [facility] [place], [place], MD   4 months ago Anemia due to GI blood loss   [facility] [place], [place], MD   5 months ago Hypotension due to hypovolemia   [facility] [place], [place], MD   5 months ago Orthostatic dizziness   [facility] [place], [place], MD       Future Appointments             In 5 months Juline Patch, MD [facility], [facility]

## 2022-07-16 DIAGNOSIS — Z7901 Long term (current) use of anticoagulants: Secondary | ICD-10-CM | POA: Diagnosis not present

## 2022-07-16 DIAGNOSIS — R42 Dizziness and giddiness: Secondary | ICD-10-CM | POA: Diagnosis not present

## 2022-07-16 DIAGNOSIS — K219 Gastro-esophageal reflux disease without esophagitis: Secondary | ICD-10-CM | POA: Diagnosis not present

## 2022-07-16 DIAGNOSIS — I255 Ischemic cardiomyopathy: Secondary | ICD-10-CM | POA: Diagnosis not present

## 2022-07-16 DIAGNOSIS — I447 Left bundle-branch block, unspecified: Secondary | ICD-10-CM | POA: Diagnosis not present

## 2022-07-16 DIAGNOSIS — I5042 Chronic combined systolic (congestive) and diastolic (congestive) heart failure: Secondary | ICD-10-CM | POA: Diagnosis not present

## 2022-07-16 DIAGNOSIS — I480 Paroxysmal atrial fibrillation: Secondary | ICD-10-CM | POA: Diagnosis not present

## 2022-07-16 DIAGNOSIS — I2510 Atherosclerotic heart disease of native coronary artery without angina pectoris: Secondary | ICD-10-CM | POA: Diagnosis not present

## 2022-07-16 DIAGNOSIS — E7800 Pure hypercholesterolemia, unspecified: Secondary | ICD-10-CM | POA: Diagnosis not present

## 2022-07-16 DIAGNOSIS — I10 Essential (primary) hypertension: Secondary | ICD-10-CM | POA: Diagnosis not present

## 2022-07-16 DIAGNOSIS — I110 Hypertensive heart disease with heart failure: Secondary | ICD-10-CM | POA: Diagnosis not present

## 2022-07-21 DIAGNOSIS — Z7901 Long term (current) use of anticoagulants: Secondary | ICD-10-CM | POA: Diagnosis not present

## 2022-07-21 DIAGNOSIS — I110 Hypertensive heart disease with heart failure: Secondary | ICD-10-CM | POA: Diagnosis not present

## 2022-07-21 DIAGNOSIS — I951 Orthostatic hypotension: Secondary | ICD-10-CM | POA: Diagnosis not present

## 2022-07-21 DIAGNOSIS — Z79899 Other long term (current) drug therapy: Secondary | ICD-10-CM | POA: Diagnosis not present

## 2022-07-21 DIAGNOSIS — I5022 Chronic systolic (congestive) heart failure: Secondary | ICD-10-CM | POA: Diagnosis not present

## 2022-07-21 DIAGNOSIS — I2510 Atherosclerotic heart disease of native coronary artery without angina pectoris: Secondary | ICD-10-CM | POA: Diagnosis not present

## 2022-07-21 DIAGNOSIS — R3915 Urgency of urination: Secondary | ICD-10-CM | POA: Diagnosis not present

## 2022-07-21 DIAGNOSIS — N390 Urinary tract infection, site not specified: Secondary | ICD-10-CM | POA: Diagnosis not present

## 2022-07-21 DIAGNOSIS — R519 Headache, unspecified: Secondary | ICD-10-CM | POA: Diagnosis not present

## 2022-07-21 DIAGNOSIS — I480 Paroxysmal atrial fibrillation: Secondary | ICD-10-CM | POA: Diagnosis not present

## 2022-07-21 DIAGNOSIS — R42 Dizziness and giddiness: Secondary | ICD-10-CM | POA: Diagnosis not present

## 2022-07-21 DIAGNOSIS — R9431 Abnormal electrocardiogram [ECG] [EKG]: Secondary | ICD-10-CM | POA: Diagnosis not present

## 2022-10-05 ENCOUNTER — Ambulatory Visit: Payer: Self-pay | Admitting: *Deleted

## 2022-10-31 DIAGNOSIS — H903 Sensorineural hearing loss, bilateral: Secondary | ICD-10-CM | POA: Diagnosis not present

## 2022-11-08 DIAGNOSIS — H903 Sensorineural hearing loss, bilateral: Secondary | ICD-10-CM | POA: Diagnosis not present

## 2022-12-24 ENCOUNTER — Ambulatory Visit (INDEPENDENT_AMBULATORY_CARE_PROVIDER_SITE_OTHER): Payer: Medicare HMO | Admitting: Family Medicine

## 2022-12-24 ENCOUNTER — Encounter: Payer: Self-pay | Admitting: Family Medicine

## 2022-12-24 VITALS — BP 118/62 | HR 54 | Ht 64.0 in | Wt 162.0 lb

## 2022-12-24 DIAGNOSIS — K219 Gastro-esophageal reflux disease without esophagitis: Secondary | ICD-10-CM

## 2022-12-24 DIAGNOSIS — F3341 Major depressive disorder, recurrent, in partial remission: Secondary | ICD-10-CM | POA: Diagnosis not present

## 2022-12-24 MED ORDER — OMEPRAZOLE 40 MG PO CPDR: 40.0000 mg | DELAYED_RELEASE_CAPSULE | Freq: Two times a day (BID) | ORAL | 1 refills | Status: DC

## 2022-12-24 MED ORDER — BUPROPION HCL 100 MG PO TABS: 100.0000 mg | ORAL_TABLET | Freq: Two times a day (BID) | ORAL | 1 refills | Status: DC

## 2022-12-24 NOTE — Progress Notes (Signed)
Date:  12/24/2022   Name:  Veronica Ellis   DOB:  June 13, 1934   MRN:  [id number]   Chief Complaint: Gastroesophageal Reflux and Depression  Gastroesophageal Reflux She reports no chest pain, no choking, no coughing, no dysphagia, no hoarse voice, no nausea or no wheezing. This is a chronic problem. The current episode started more than 1 year ago. The problem has been gradually improving. The symptoms are aggravated by certain foods. She has tried a PPI for the symptoms.  Depression         Lab Results  Component Value Date   NA 141 04/22/2022   K 4.0 04/22/2022   CO2 25 04/22/2022   GLUCOSE 126 (H) 04/22/2022   BUN 19 04/22/2022   CREATININE 1.30 (H) 04/22/2022   CALCIUM 8.2 (L) 04/22/2022   GFRNONAA 40 (L) 04/22/2022   Lab Results  Component Value Date   CHOL 129 05/19/2020   HDL 52 05/19/2020   LDLCALC 56 05/19/2020   TRIG 120 05/19/2020   CHOLHDL 3.6 05/20/2018   Lab Results  Component Value Date   TSH 3.246 02/11/2022   Lab Results  Component Value Date   HGBA1C 5.4 05/19/2018   Lab Results  Component Value Date   WBC 7.4 04/26/2022   HGB 10.8 (L) 04/26/2022   HCT 33.1 (L) 04/26/2022   MCV 90 04/26/2022   PLT 360 04/26/2022   Lab Results  Component Value Date   ALT 28 04/21/2022   AST 23 04/21/2022   ALKPHOS 59 04/21/2022   BILITOT 0.5 04/21/2022   No results found for: "25OHVITD2", "25OHVITD3", "VD25OH"   Review of Systems  HENT:  Negative for hoarse voice.   Respiratory:  Negative for cough, choking and wheezing.   Cardiovascular:  Negative for chest pain, palpitations and leg swelling.  Gastrointestinal:  Negative for dysphagia and nausea.  Psychiatric/Behavioral:  Positive for depression.     Patient Active Problem List   Diagnosis Date Noted   GI bleed 04/21/2022   Gross hematuria 04/21/2022   Essential hypertension 04/21/2022   Symptomatic ABLA due to GIB in the setting of supratherapeutic INR 02/09/2022   Supratherapeutic INR  02/09/2022   Goals of care, counseling/discussion 02/09/2022   Paroxysmal A-fib 02/09/2022   Urinary tract infection 02/09/2022   Generalized weakness 02/09/2022   GI bleeding 02/08/2022   Coagulopathy/supratherapeutic INR on warfarin 02/08/2022   CKD-3A 02/08/2022   Dyslipidemia 02/08/2022   Depression 02/08/2022   Hypotension 02/08/2022   GERD without esophagitis 08/31/2019   History of IBS 09/26/2018   Long term (current) use of anticoagulants 07/17/2018   3-vessel CAD 07/08/2018   Acute on chronic combined systolic and diastolic CHF (congestive heart failure) 07/08/2018   Ischemic cardiomyopathy 07/08/2018   AKI (acute kidney injury) 06/06/2018   Hyperkalemia 06/05/2018   Atrial fibrillation with RVR 05/21/2018   Symptomatic anemia 05/21/2018   NSTEMI (non-ST elevated myocardial infarction) 05/19/2018   Chronic systolic CHF (congestive heart failure) 07/30/2017   LBBB (left bundle branch block) 07/10/2017   Lumbar radiculopathy 05/16/2017   Hx of low back pain 05/14/2017   Ataxia 12/31/2016   Memory loss 12/31/2016   Recurrent major depressive disorder, in partial remission 12/31/2016   Bipolar 1 disorder, mixed, moderate 12/25/2016   Chronic bilateral low back pain with bilateral sciatica 10/18/2016   Bilateral breast cancer 02/20/2016   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   Cerebrovascular disease 02/16/2015   Anxiety and depression 02/16/2015   Breast lump 02/16/2015  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   Tobacco abuse, in remission 02/16/2015   Routine general medical examination at a health care facility 02/16/2015   Recurrent major depressive episodes 02/16/2015   Below normal amount of sodium in the blood 02/16/2015   Pre-operative cardiovascular examination 02/16/2015    Allergies  Allergen Reactions   Iodine Nausea Only    Betadine okay    Erythromycin Rash   Other Rash   Penicillins Rash    Has patient had a PCN reaction  causing immediate rash, facial/tongue/throat swelling, SOB or lightheadedness with hypotension: No Has patient had a PCN reaction causing severe rash involving mucus membranes or skin necrosis: No Has patient had a PCN reaction that required hospitalization: No Has patient had a PCN reaction occurring within the last 10 years: No If all of the above answers are "NO", then may proceed with Cephalosporin use.    Shellfish Allergy Rash    Past Surgical History:  Procedure Laterality Date   APPENDECTOMY     BREAST BIOPSY Bilateral 2014   BREAST LUMPECTOMY Bilateral 05/2013   BREAST SURGERY Bilateral    CATARACT EXTRACTION W/PHACO Right 12/11/2017   Procedure: CATARACT EXTRACTION PHACO AND INTRAOCULAR LENS PLACEMENT (IOC) COMPLICATED RIGHT;  Surgeon: Lockie Mola, MD;  Location: [facility];  Service: Ophthalmology;  Laterality: Right;   COLON SURGERY     12 in. removed due to polyps   COLONOSCOPY WITH PROPOFOL N/A 02/10/2022   Procedure: COLONOSCOPY WITH PROPOFOL;  Surgeon: Toney Reil, MD;  Location: [facility] ENDOSCOPY;  Service: Gastroenterology;  Laterality: N/A;   ESOPHAGOGASTRODUODENOSCOPY (EGD) WITH PROPOFOL N/A 02/09/2022   Procedure: ESOPHAGOGASTRODUODENOSCOPY (EGD) WITH PROPOFOL;  Surgeon: Wyline Mood, MD;  Location: [facility] ENDOSCOPY;  Service: Gastroenterology;  Laterality: N/A;   GIVENS CAPSULE STUDY N/A 02/11/2022   Procedure: GIVENS CAPSULE STUDY;  Surgeon: Toney Reil, MD;  Location: [facility] ENDOSCOPY;  Service: Gastroenterology;  Laterality: N/A;   LEFT HEART CATH AND CORONARY ANGIOGRAPHY N/A 05/20/2018   Procedure: LEFT HEART CATH AND CORONARY ANGIOGRAPHY;  Surgeon: Lamar Blinks, MD;  Location: [facility] INVASIVE CV LAB;  Service: Cardiovascular;  Laterality: N/A;   TUBAL LIGATION      Social History   Tobacco Use   Smoking status: Former    Packs/day: 1.00    Years: 60.00    Additional pack years: 0.00    Total pack years: 60.00    Types: Cigarettes     Quit date: 12/2017    Years since quitting: 5.0   Smokeless tobacco: Never   Tobacco comments:    smoking cessation materials not required  Vaping Use   Vaping Use: Never used  Substance Use Topics   Alcohol use: Not Currently    Alcohol/week: 0.0 standard drinks of alcohol   Drug use: No     Medication list has been reviewed and updated.  Current Meds  Medication Sig   apixaban (ELIQUIS) 5 MG TABS tablet Take 1 tablet (5 mg total) by mouth 2 (two) times daily.   atorvastatin (LIPITOR) 80 MG tablet Take 80 mg by mouth at bedtime.   buPROPion (WELLBUTRIN) 100 MG tablet Take 1 tablet (100 mg total) by mouth 2 (two) times daily.   ezetimibe (ZETIA) 10 MG tablet Take by mouth. Take 1 tablet (10 mg total) by mouth once daily   magnesium oxide (MAG-OX) 400 MG tablet Take 1 tablet by mouth daily.   metoprolol succinate (TOPROL-XL) 25 MG 24 hr tablet Take 0.5 tablets (12.5 mg total) by mouth  daily.   nitroGLYCERIN (NITROSTAT) 0.4 MG SL tablet Place 0.4 mg under the tongue every 5 (five) minutes as needed for chest pain.   omeprazole (PRILOSEC) 40 MG capsule Take 1 capsule (40 mg total) by mouth 2 (two) times daily.   sacubitril-valsartan (ENTRESTO) 24-26 MG Take 1 tablet by mouth 2 (two) times daily.   spironolactone (ALDACTONE) 25 MG tablet Take 25 mg by mouth at bedtime.   torsemide (DEMADEX) 10 MG tablet Take by mouth. Take 1 tablet (10 mg total) by mouth once daily as needed (take if weight increases by 2lbs in 1 day or 5lbs in 1 week from 150lbs.)       12/24/2022    1:48 PM 07/03/2022    1:19 PM 04/26/2022    2:49 PM 02/15/2022    1:33 PM  GAD 7 : Generalized Anxiety Score  Nervous, Anxious, on Edge 0 0 1 0  Control/stop worrying 0 0 1 0  Worry too much - different things 0 0 1 0  Trouble relaxing 0 0 0 0  Restless 0 0 0 0  Easily annoyed or irritable 0 0 0 0  Afraid - awful might happen 0 0 0 0  Total GAD 7 Score 0 0 3 0  Anxiety Difficulty Not difficult at all Not difficult  at all Not difficult at all Not difficult at all       12/24/2022    1:47 PM 07/03/2022    1:18 PM 04/26/2022    2:49 PM  Depression screen PHQ 2/9  Decreased Interest 0 0 0  Down, Depressed, Hopeless 0 0 0  PHQ - 2 Score 0 0 0  Altered sleeping 0 2 0  Tired, decreased energy 0 0 0  Change in appetite 0 0 0  Feeling bad or failure about yourself  0 0 0  Trouble concentrating 0 0 1  Moving slowly or fidgety/restless 0 0 3  Suicidal thoughts 0 0 0  PHQ-9 Score 0 2 4  Difficult doing work/chores Not difficult at all Not difficult at all Extremely dIfficult    BP Readings from Last 3 Encounters:  12/24/22 118/62  07/03/22 90/60  04/26/22 112/80    Physical Exam Vitals and nursing note reviewed. Exam conducted with a chaperone present.  Constitutional:      General: She is not in acute distress.    Appearance: She is not diaphoretic.  HENT:     Head: Normocephalic and atraumatic.     Right Ear: Tympanic membrane and external ear normal.     Left Ear: Tympanic membrane and external ear normal.     Nose: Nose normal. No congestion or rhinorrhea.     Mouth/Throat:     Pharynx: No oropharyngeal exudate or posterior oropharyngeal erythema.  Eyes:     General:        Right eye: No discharge.        Left eye: No discharge.     Conjunctiva/sclera: Conjunctivae normal.     Pupils: Pupils are equal, round, and reactive to light.  Neck:     Thyroid: No thyromegaly.     Vascular: No JVD.  Cardiovascular:     Rate and Rhythm: Normal rate and regular rhythm.     Heart sounds: Normal heart sounds. No murmur heard.    No friction rub. No gallop.  Pulmonary:     Effort: Pulmonary effort is normal.     Breath sounds: Normal breath sounds. No wheezing, rhonchi or rales.  Abdominal:  General: Bowel sounds are normal.     Palpations: Abdomen is soft. There is no mass.     Tenderness: There is no abdominal tenderness. There is no guarding.  Musculoskeletal:        General: Normal  range of motion.     Cervical back: Neck supple.  Lymphadenopathy:     Cervical: No cervical adenopathy.  Skin:    General: Skin is warm and dry.  Neurological:     Mental Status: She is alert.     Wt Readings from Last 3 Encounters:  12/24/22 162 lb (73.5 kg)  07/03/22 164 lb (74.4 kg)  04/26/22 165 lb (74.8 kg)    BP 118/62   Pulse (!) 54   Ht  (1.626 m)   Wt 162 lb (73.5 kg)   SpO2 97%   BMI 27.81 kg/m   Assessment and Plan:  1. Recurrent major depressive disorder, in partial remission Chronic.  Controlled.  Stable.  PHQ is 0.  GAD score is 0.  Continue bupropion 100 mg twice a day will recheck in 6 months. - buPROPion (WELLBUTRIN) 100 MG tablet; Take 1 tablet (100 mg total) by mouth 2 (two) times daily.  Dispense: 180 tablet; Refill: 1  2. GERD without esophagitis Chronic.  Controlled.  Stable.  Esophageal reflux is controlled as well as gastritis on omeprazole 40 mg twice a day.  Will recheck in 6 months. - omeprazole (PRILOSEC) 40 MG capsule; Take 1 capsule (40 mg total) by mouth 2 (two) times daily.  Dispense: 180 capsule; Refill: 1    Elizabeth Sauer, MD

## 2022-12-26 ENCOUNTER — Telehealth: Payer: Self-pay | Admitting: Family Medicine

## 2022-12-26 NOTE — Telephone Encounter (Signed)
Contacted Veronica Ellis to schedule their annual wellness visit. Appointment made for 01/09/2023.  Verlee Rossetti; Care Guide Ambulatory Clinical Support [facility] Direct Dial: [phone]

## 2022-12-27 ENCOUNTER — Encounter: Payer: Self-pay | Admitting: Family Medicine

## 2023-01-09 ENCOUNTER — Ambulatory Visit (INDEPENDENT_AMBULATORY_CARE_PROVIDER_SITE_OTHER): Payer: Medicare HMO

## 2023-01-09 VITALS — Ht 64.0 in | Wt 162.0 lb

## 2023-01-09 DIAGNOSIS — Z0000 Encounter for general adult medical examination without abnormal findings: Secondary | ICD-10-CM

## 2023-01-09 NOTE — Progress Notes (Signed)
I connected with  Michiel Cowboy on 01/09/23 by a audio enabled telemedicine application and verified that I am speaking with the correct person using two identifiers.  Patient Location: Home  Provider Location: Office/Clinic  I discussed the limitations of evaluation and management by telemedicine. The patient expressed understanding and agreed to proceed.  Subjective:   Veronica Ellis is a 89 y.o. female who presents for Medicare Annual (Subsequent) preventive examination.  Review of Systems     Cardiac Risk Factors include: advanced age (>4men, >87 women);dyslipidemia;sedentary lifestyle     Objective:    There were no vitals filed for this visit. There is no height or weight on file to calculate BMI.     01/09/2023    8:54 AM 04/21/2022    7:35 PM 04/20/2022    3:10 PM 02/09/2022    3:11 AM 02/08/2022    4:53 PM 01/03/2022    3:41 PM 07/06/2020   10:34 AM  Advanced Directives  Does Patient Have a Medical Advance Directive? No No No  No No No  Would patient like information on creating a medical advance directive? No - Patient declined No - Patient declined  No - Patient declined  No - Patient declined No - Patient declined    Current Medications (verified) Outpatient Encounter Medications as of 01/09/2023  Medication Sig   apixaban (ELIQUIS) 5 MG TABS tablet Take 1 tablet (5 mg total) by mouth 2 (two) times daily.   atorvastatin (LIPITOR) 80 MG tablet Take 80 mg by mouth at bedtime.   buPROPion (WELLBUTRIN) 100 MG tablet Take 1 tablet (100 mg total) by mouth 2 (two) times daily.   ezetimibe (ZETIA) 10 MG tablet Take by mouth. Take 1 tablet (10 mg total) by mouth once daily   metoprolol succinate (TOPROL-XL) 25 MG 24 hr tablet Take 0.5 tablets (12.5 mg total) by mouth daily.   nitroGLYCERIN (NITROSTAT) 0.4 MG SL tablet Place 0.4 mg under the tongue every 5 (five) minutes as needed for chest pain.   omeprazole (PRILOSEC) 40 MG capsule Take 1 capsule (40 mg total) by  mouth 2 (two) times daily.   sacubitril-valsartan (ENTRESTO) 24-26 MG Take 1 tablet by mouth 2 (two) times daily.   spironolactone (ALDACTONE) 25 MG tablet Take 25 mg by mouth at bedtime.   torsemide (DEMADEX) 10 MG tablet Take by mouth. Take 1 tablet (10 mg total) by mouth once daily as needed (take if weight increases by 2lbs in 1 day or 5lbs in 1 week from 150lbs.)   No facility-administered encounter medications on file as of 01/09/2023.    Allergies (verified) Iodine, Erythromycin, Other, Penicillins, and Shellfish allergy   History: Past Medical History:  Diagnosis Date   Anxiety    Anxiety    Arthritis    Breast cancer (HCC) 2014   bilateral invasive mammary carcinoma. with 36 rad tx.    Breast cancer (HCC)    Cancer (HCC)    breast   Depression    Depression    Hard of hearing    Hyperlipemia    Hypertension    IBS (irritable bowel syndrome)    Memory loss    Osteoporosis    Personal history of radiation therapy    Wears dentures    full upper, partial lower   Past Surgical History:  Procedure Laterality Date   APPENDECTOMY     BREAST BIOPSY Bilateral 2014   BREAST LUMPECTOMY Bilateral 05/2013   BREAST SURGERY Bilateral    CATARACT  EXTRACTION W/PHACO Right 12/11/2017   Procedure: CATARACT EXTRACTION PHACO AND INTRAOCULAR LENS PLACEMENT (IOC) COMPLICATED RIGHT;  Surgeon: Lockie Mola, MD;  Location: [facility];  Service: Ophthalmology;  Laterality: Right;   COLON SURGERY     12 in. removed due to polyps   COLONOSCOPY WITH PROPOFOL N/A 02/10/2022   Procedure: COLONOSCOPY WITH PROPOFOL;  Surgeon: Toney Reil, MD;  Location: [facility] ENDOSCOPY;  Service: Gastroenterology;  Laterality: N/A;   ESOPHAGOGASTRODUODENOSCOPY (EGD) WITH PROPOFOL N/A 02/09/2022   Procedure: ESOPHAGOGASTRODUODENOSCOPY (EGD) WITH PROPOFOL;  Surgeon: Wyline Mood, MD;  Location: [facility] ENDOSCOPY;  Service: Gastroenterology;  Laterality: N/A;   GIVENS CAPSULE STUDY N/A 02/11/2022    Procedure: GIVENS CAPSULE STUDY;  Surgeon: Toney Reil, MD;  Location: [facility] ENDOSCOPY;  Service: Gastroenterology;  Laterality: N/A;   LEFT HEART CATH AND CORONARY ANGIOGRAPHY N/A 05/20/2018   Procedure: LEFT HEART CATH AND CORONARY ANGIOGRAPHY;  Surgeon: Lamar Blinks, MD;  Location: [facility] INVASIVE CV LAB;  Service: Cardiovascular;  Laterality: N/A;   TUBAL LIGATION     Family History  Problem Relation Age of Onset   Breast cancer Daughter 19   Breast cancer Daughter 39   Cirrhosis Mother    Lung cancer Father    Social History   Socioeconomic History   Marital status: Widowed    Spouse name: Not on file   Number of children: 5   Years of education: some college   Highest education level: 12th grade  Occupational History   Occupation: Retired  Tobacco Use   Smoking status: Former    Packs/day: 1.00    Years: 60.00    Additional pack years: 0.00    Total pack years: 60.00    Types: Cigarettes    Quit date: 12/2017    Years since quitting: 5.0   Smokeless tobacco: Never   Tobacco comments:    smoking cessation materials not required  Vaping Use   Vaping Use: Never used  Substance and Sexual Activity   Alcohol use: Not Currently    Alcohol/week: 0.0 standard drinks of alcohol   Drug use: No   Sexual activity: Yes  Other Topics Concern   Not on file  Social History Narrative   Pt's son Ed lives with her   Social Determinants of Health   Financial Resource Strain: Low Risk  (01/09/2023)   Overall Financial Resource Strain (CARDIA)    Difficulty of Paying Living Expenses: Not very hard  Food Insecurity: No Food Insecurity (01/09/2023)   Hunger Vital Sign    Worried About Running Out of Food in the Last Year: Never true    Ran Out of Food in the Last Year: Never true  Transportation Needs: No Transportation Needs (01/09/2023)   PRAPARE - Administrator, Civil Service (Medical): No    Lack of Transportation (Non-Medical): No  Physical Activity:  Inactive (01/09/2023)   Exercise Vital Sign    Days of Exercise per Week: 0 days    Minutes of Exercise per Session: 0 min  Stress: No Stress Concern Present (01/09/2023)   [employer] of Occupational Health - Occupational Stress Questionnaire    Feeling of Stress : Not at all  Social Connections: Socially Isolated (01/03/2022)   Social Connection and Isolation Panel [NHANES]    Frequency of Communication with Friends and Family: More than three times a week    Frequency of Social Gatherings with Friends and Family: Three times a week    Attends Religious Services: Never  Active Member of Clubs or Organizations: No    Attends Banker Meetings: Never    Marital Status: Widowed    Tobacco Counseling Counseling given: Not Answered Tobacco comments: smoking cessation materials not required   Clinical Intake:  Pre-visit preparation completed: Yes  Pain : No/denies pain     Nutritional Risks: None Diabetes: No  How often do you need to have someone help you when you read instructions, pamphlets, or other written materials from your doctor or pharmacy?: 1 - Never  Diabetic?  Interpreter Needed?: No  Information entered by :: Kennedy Bucker, LPN   Activities of Daily Living    01/09/2023    8:55 AM 04/21/2022    7:35 PM  In your present state of health, do you have any difficulty performing the following activities:  Hearing? 1 1  Vision? 0 0  Difficulty concentrating or making decisions? 0 0  Walking or climbing stairs? 1 0  Dressing or bathing? 0 0  Doing errands, shopping? 1 0  Preparing Food and eating ? N   Using the Toilet? N   In the past six months, have you accidently leaked urine? N   Do you have problems with loss of bowel control? N   Managing your Medications? Y   Managing your Finances? Y   Housekeeping or managing your Housekeeping? Y     Patient Care Team: Duanne Limerick, MD as PCP - General (Family Medicine)  Indicate any recent  Medical Services you may have received from other than [facility] providers in the past year (date may be approximate).     Assessment:   This is a routine wellness examination for Kasee.  Hearing/Vision screen Hearing Screening - Comments:: Wears aids, loses all the time  Vision Screening - Comments:: Wears glasses- Dr.Brasington  Dietary issues and exercise activities discussed: Current Exercise Habits: The patient does not participate in regular exercise at present   Goals Addressed             This Visit's Progress    DIET - EAT MORE FRUITS AND VEGETABLES         Depression Screen    01/09/2023    8:53 AM 12/24/2022    1:47 PM 07/03/2022    1:18 PM 04/26/2022    2:49 PM 02/15/2022    1:33 PM 01/03/2022    3:40 PM 12/21/2021    1:15 PM  PHQ 2/9 Scores  PHQ - 2 Score 0 0 0 0 0 4 4  PHQ- 9 Score 0 0 2 4 3 6 7     Fall Risk    01/09/2023    8:55 AM 12/24/2022    1:47 PM 07/03/2022    1:18 PM 04/26/2022    2:49 PM 01/03/2022    3:42 PM  Fall Risk   Falls in the past year? 1 0 0 1 1  Number falls in past yr: 0 0 0 0 1  Injury with Fall? 0 0 0 0 0  Risk for fall due to : History of fall(s) No Fall Risks History of fall(s) Impaired balance/gait Impaired balance/gait;Impaired mobility  Follow up Falls prevention discussed;Falls evaluation completed Falls evaluation completed  Falls evaluation completed Falls prevention discussed    FALL RISK PREVENTION PERTAINING TO THE HOME:  Any stairs in or around the home? Yes  If so, are there any without handrails? No  Home free of loose throw rugs in walkways, pet beds, electrical cords, etc? Yes  Adequate lighting in  your home to reduce risk of falls? Yes   ASSISTIVE DEVICES UTILIZED TO PREVENT FALLS:  Life alert? No  Use of a cane, walker or w/c? No  Grab bars in the bathroom? Yes  Shower chair or bench in shower? Yes  Elevated toilet seat or a handicapped toilet? Yes   Cognitive Function:        01/09/2023    8:58 AM  03/03/2018    2:00 PM 02/25/2017    1:46 PM  6CIT Screen  What Year? 0 points 0 points 0 points  What month? 0 points 0 points 0 points  What time? 0 points 0 points 0 points  Count back from 20 0 points 0 points 0 points  Months in reverse 0 points 0 points 0 points  Repeat phrase 0 points 6 points 0 points  Total Score 0 points 6 points 0 points    Immunizations Immunization History  Administered Date(s) Administered   Fluad Quad(high Dose 65+) 05/11/2019, 05/19/2020, 06/22/2021, 07/03/2022   Influenza, High Dose Seasonal PF 06/10/2018    TDAP status: Due, Education has been provided regarding the importance of this vaccine. Advised may receive this vaccine at local pharmacy or Health Dept. Aware to provide a copy of the vaccination record if obtained from local pharmacy or Health Dept. Verbalized acceptance and understanding.  Flu Vaccine status: Up to date  Pneumococcal vaccine status: Declined,  Education has been provided regarding the importance of this vaccine but patient still declined. Advised may receive this vaccine at local pharmacy or Health Dept. Aware to provide a copy of the vaccination record if obtained from local pharmacy or Health Dept. Verbalized acceptance and understanding.   Covid-19 vaccine status: Declined, Education has been provided regarding the importance of this vaccine but patient still declined. Advised may receive this vaccine at local pharmacy or Health Dept.or vaccine clinic. Aware to provide a copy of the vaccination record if obtained from local pharmacy or Health Dept. Verbalized acceptance and understanding.  Qualifies for Shingles Vaccine? Yes   Zostavax completed No   Shingrix Completed?: No.    Education has been provided regarding the importance of this vaccine. Patient has been advised to call insurance company to determine out of pocket expense if they have not yet received this vaccine. Advised may also receive vaccine at local pharmacy or  Health Dept. Verbalized acceptance and understanding.  Screening Tests Health Maintenance  Topic Date Due   COVID-19 Vaccine (1) 01/09/2023 (Originally 11/10/1938)   Zoster Vaccines- Shingrix (1 of 2) 03/25/2023 (Originally 11/11/1952)   Pneumonia Vaccine 68+ Years old (1 of 2 - PCV) 12/24/2023 (Originally 11/10/1939)   INFLUENZA VACCINE  04/11/2023   Medicare Annual Wellness (AWV)  01/09/2024   DEXA SCAN  Completed   HPV VACCINES  Aged Out   DTaP/Tdap/Td  Discontinued    Health Maintenance  There are no preventive care reminders to display for this patient.   Colorectal cancer screening: No longer required.   Mammogram status: No longer required due to age.    Lung Cancer Screening: (Low Dose CT Chest recommended if Age 53-80 years, 30 pack-year currently smoking OR have quit w/in 15years.) does not qualify.     Additional Screening:  Hepatitis C Screening: does not qualify; Completed no  Vision Screening: Recommended annual ophthalmology exams for early detection of glaucoma and other disorders of the eye. Is the patient up to date with their annual eye exam?  Yes  Who is the provider or what is the  name of the office in which the patient attends annual eye exams? Dr.Brasington If pt is not established with a provider, would they like to be referred to a provider to establish care? No .   Dental Screening: Recommended annual dental exams for proper oral hygiene  Community Resource Referral / Chronic Care Management: CRR required this visit?  No   CCM required this visit?  No      Plan:     I have personally reviewed and noted the following in the patient's chart:   Medical and social history Use of alcohol, tobacco or illicit drugs  Current medications and supplements including opioid prescriptions. Patient is not currently taking opioid prescriptions. Functional ability and status Nutritional status Physical activity Advanced directives List of other  physicians Hospitalizations, surgeries, and ER visits in previous 12 months Vitals Screenings to include cognitive, depression, and falls Referrals and appointments  In addition, I have reviewed and discussed with patient certain preventive protocols, quality metrics, and best practice recommendations. A written personalized care plan for preventive services as well as general preventive health recommendations were provided to patient.     Hal Hope, LPN   09/15/1094   Nurse Notes: none

## 2023-01-09 NOTE — Patient Instructions (Signed)
Veronica Ellis , Thank you for taking time to come for your Medicare Wellness Visit. I appreciate your ongoing commitment to your health goals. Please review the following plan we discussed and let me know if I can assist you in the future.   These are the goals we discussed:  Goals      DIET - EAT MORE FRUITS AND VEGETABLES     DIET - INCREASE WATER INTAKE     Recommend to drink at least 6-8 8oz glasses of water per day.     Quit smoking / using tobacco     Smoking cessation discussed        This is a list of the screening recommended for you and due dates:  Health Maintenance  Topic Date Due   COVID-19 Vaccine (1) 01/09/2023*   Zoster (Shingles) Vaccine (1 of 2) 03/25/2023*   Pneumonia Vaccine (1 of 2 - PCV) 12/24/2023*   Flu Shot  04/11/2023   Medicare Annual Wellness Visit  01/09/2024   DEXA scan (bone density measurement)  Completed   HPV Vaccine  Aged Out   DTaP/Tdap/Td vaccine  Discontinued  *Topic was postponed. The date shown is not the original due date.    Advanced directives: no  Conditions/risks identified: none  Next appointment: Follow up in one year for your annual wellness visit 01/15/24 @ 8:45 am by phone   Preventive Care 65 Years and Older, Female Preventive care refers to lifestyle choices and visits with your health care provider that can promote health and wellness. What does preventive care include? A yearly physical exam. This is also called an annual well check. Dental exams once or twice a year. Routine eye exams. Ask your health care provider how often you should have your eyes checked. Personal lifestyle choices, including: Daily care of your teeth and gums. Regular physical activity. Eating a healthy diet. Avoiding tobacco and drug use. Limiting alcohol use. Practicing safe sex. Taking low-dose aspirin every day. Taking vitamin and mineral supplements as recommended by your health care provider. What happens during an annual well  check? The services and screenings done by your health care provider during your annual well check will depend on your age, overall health, lifestyle risk factors, and family history of disease. Counseling  Your health care provider may ask you questions about your: Alcohol use. Tobacco use. Drug use. Emotional well-being. Home and relationship well-being. Sexual activity. Eating habits. History of falls. Memory and ability to understand (cognition). Work and work Astronomer. Reproductive health. Screening  You may have the following tests or measurements: Height, weight, and BMI. Blood pressure. Lipid and cholesterol levels. These may be checked every 5 years, or more frequently if you are over 59 years old. Skin check. Lung cancer screening. You may have this screening every year starting at age 55 if you have a 30-pack-year history of smoking and currently smoke or have quit within the past 15 years. Fecal occult blood test (FOBT) of the stool. You may have this test every year starting at age 29. Flexible sigmoidoscopy or colonoscopy. You may have a sigmoidoscopy every 5 years or a colonoscopy every 10 years starting at age 53. Hepatitis C blood test. Hepatitis B blood test. Sexually transmitted disease (STD) testing. Diabetes screening. This is done by checking your blood sugar (glucose) after you have not eaten for a while (fasting). You may have this done every 1-3 years. Bone density scan. This is done to screen for osteoporosis. You may have this done  starting at age 39. Mammogram. This may be done every 1-2 years. Talk to your health care provider about how often you should have regular mammograms. Talk with your health care provider about your test results, treatment options, and if necessary, the need for more tests. Vaccines  Your health care provider may recommend certain vaccines, such as: Influenza vaccine. This is recommended every year. Tetanus, diphtheria, and  acellular pertussis (Tdap, Td) vaccine. You may need a Td booster every 10 years. Zoster vaccine. You may need this after age 46. Pneumococcal 13-valent conjugate (PCV13) vaccine. One dose is recommended after age 18. Pneumococcal polysaccharide (PPSV23) vaccine. One dose is recommended after age 13. Talk to your health care provider about which screenings and vaccines you need and how often you need them. This information is not intended to replace advice given to you by your health care provider. Make sure you discuss any questions you have with your health care provider. Document Released: 09/23/2015 Document Revised: 05/16/2016 Document Reviewed: 06/28/2015 Elsevier Interactive Patient Education  2017 [place] Prevention in the Home Falls can cause injuries. They can happen to people of all ages. There are many things you can do to make your home safe and to help prevent falls. What can I do on the outside of my home? Regularly fix the edges of walkways and driveways and fix any cracks. Remove anything that might make you trip as you walk through a door, such as a raised step or threshold. Trim any bushes or trees on the path to your home. Use bright outdoor lighting. Clear any walking paths of anything that might make someone trip, such as rocks or tools. Regularly check to see if handrails are loose or broken. Make sure that both sides of any steps have handrails. Any raised decks and porches should have guardrails on the edges. Have any leaves, snow, or ice cleared regularly. Use sand or salt on walking paths during winter. Clean up any spills in your garage right away. This includes oil or grease spills. What can I do in the bathroom? Use night lights. Install grab bars by the toilet and in the tub and shower. Do not use towel bars as grab bars. Use non-skid mats or decals in the tub or shower. If you need to sit down in the shower, use a plastic, non-slip stool. Keep  the floor dry. Clean up any water that spills on the floor as soon as it happens. Remove soap buildup in the tub or shower regularly. Attach bath mats securely with double-sided non-slip rug tape. Do not have throw rugs and other things on the floor that can make you trip. What can I do in the bedroom? Use night lights. Make sure that you have a light by your bed that is easy to reach. Do not use any sheets or blankets that are too big for your bed. They should not hang down onto the floor. Have a firm chair that has side arms. You can use this for support while you get dressed. Do not have throw rugs and other things on the floor that can make you trip. What can I do in the kitchen? Clean up any spills right away. Avoid walking on wet floors. Keep items that you use a lot in easy-to-reach places. If you need to reach something above you, use a strong step stool that has a grab bar. Keep electrical cords out of the way. Do not use floor polish or wax that  makes floors slippery. If you must use wax, use non-skid floor wax. Do not have throw rugs and other things on the floor that can make you trip. What can I do with my stairs? Do not leave any items on the stairs. Make sure that there are handrails on both sides of the stairs and use them. Fix handrails that are broken or loose. Make sure that handrails are as long as the stairways. Check any carpeting to make sure that it is firmly attached to the stairs. Fix any carpet that is loose or worn. Avoid having throw rugs at the top or bottom of the stairs. If you do have throw rugs, attach them to the floor with carpet tape. Make sure that you have a light switch at the top of the stairs and the bottom of the stairs. If you do not have them, ask someone to add them for you. What else can I do to help prevent falls? Wear shoes that: Do not have high heels. Have rubber bottoms. Are comfortable and fit you well. Are closed at the toe. Do not  wear sandals. If you use a stepladder: Make sure that it is fully opened. Do not climb a closed stepladder. Make sure that both sides of the stepladder are locked into place. Ask someone to hold it for you, if possible. Clearly mark and make sure that you can see: Any grab bars or handrails. First and last steps. Where the edge of each step is. Use tools that help you move around (mobility aids) if they are needed. These include: Canes. Walkers. Scooters. Crutches. Turn on the lights when you go into a dark area. Replace any light bulbs as soon as they burn out. Set up your furniture so you have a clear path. Avoid moving your furniture around. If any of your floors are uneven, fix them. If there are any pets around you, be aware of where they are. Review your medicines with your doctor. Some medicines can make you feel dizzy. This can increase your chance of falling. Ask your doctor what other things that you can do to help prevent falls. This information is not intended to replace advice given to you by your health care provider. Make sure you discuss any questions you have with your health care provider. Document Released: 06/23/2009 Document Revised: 02/02/2016 Document Reviewed: 10/01/2014 Elsevier Interactive Patient Education  2017 [employer].

## 2023-02-19 ENCOUNTER — Other Ambulatory Visit: Payer: Self-pay | Admitting: Family Medicine

## 2023-02-19 DIAGNOSIS — F3341 Major depressive disorder, recurrent, in partial remission: Secondary | ICD-10-CM

## 2023-02-20 NOTE — Telephone Encounter (Signed)
Rx 12/24/22 #180 1RF- too soon Requested Prescriptions  Pending Prescriptions Disp Refills   buPROPion (WELLBUTRIN) 100 MG tablet [Pharmacy Med Name: BUPROPION HCL 100 MG TABLET] 180 tablet 1    Sig: TAKE 1 TABLET BY MOUTH TWICE A DAY     Psychiatry: Antidepressants - bupropion Failed - 02/19/2023  2:53 AM      Failed - Cr in normal range and within 360 days    Creatinine  Date Value Ref Range Status  10/13/2014 1.06 0.60 - 1.30 mg/dL Final   Creatinine, Ser  Date Value Ref Range Status  04/22/2022 1.30 (H) 0.44 - 1.00 mg/dL Final         Passed - AST in normal range and within 360 days    AST  Date Value Ref Range Status  04/21/2022 23 15 - 41 U/L Final   SGOT(AST)  Date Value Ref Range Status  04/29/2014 36 15 - 37 Unit/L Final         Passed - ALT in normal range and within 360 days    ALT  Date Value Ref Range Status  04/21/2022 28 0 - 44 U/L Final   SGPT (ALT)  Date Value Ref Range Status  04/29/2014 53 U/L Final    Comment:    14-63 NOTE: New Reference Range 03/30/14          Passed - Completed PHQ-2 or PHQ-9 in the last 360 days      Passed - Last BP in normal range    BP Readings from Last 1 Encounters:  12/24/22 118/62         Passed - Valid encounter within last 6 months    Recent Outpatient Visits           1 month ago Recurrent major depressive disorder, in partial remission (HCC)   [place] [facility]   7 months ago Recurrent major depressive disorder, in partial remission (HCC)   [place] [facility]   10 months ago Atrial fibrillation with RVR [facility])   [place] [facility]   1 year ago Anemia due to GI blood loss   [facility]   1 year ago Hypotension due to hypovolemia   [facility]

## 2023-04-25 ENCOUNTER — Encounter: Payer: Self-pay | Admitting: Family Medicine

## 2023-04-25 ENCOUNTER — Ambulatory Visit: Payer: Medicare HMO | Admitting: Family Medicine

## 2023-04-25 DIAGNOSIS — Z1152 Encounter for screening for COVID-19: Secondary | ICD-10-CM | POA: Diagnosis not present

## 2023-04-25 DIAGNOSIS — R9431 Abnormal electrocardiogram [ECG] [EKG]: Secondary | ICD-10-CM | POA: Diagnosis not present

## 2023-04-25 DIAGNOSIS — W06XXXA Fall from bed, initial encounter: Secondary | ICD-10-CM | POA: Diagnosis not present

## 2023-04-25 DIAGNOSIS — Y92009 Unspecified place in unspecified non-institutional (private) residence as the place of occurrence of the external cause: Secondary | ICD-10-CM | POA: Diagnosis not present

## 2023-04-25 DIAGNOSIS — Z853 Personal history of malignant neoplasm of breast: Secondary | ICD-10-CM | POA: Diagnosis not present

## 2023-04-25 DIAGNOSIS — I447 Left bundle-branch block, unspecified: Secondary | ICD-10-CM | POA: Diagnosis not present

## 2023-04-25 DIAGNOSIS — Z7901 Long term (current) use of anticoagulants: Secondary | ICD-10-CM | POA: Diagnosis not present

## 2023-04-25 DIAGNOSIS — R079 Chest pain, unspecified: Secondary | ICD-10-CM | POA: Diagnosis not present

## 2023-04-25 DIAGNOSIS — I700 Atherosclerosis of aorta: Secondary | ICD-10-CM | POA: Diagnosis not present

## 2023-04-25 DIAGNOSIS — Z8673 Personal history of transient ischemic attack (TIA), and cerebral infarction without residual deficits: Secondary | ICD-10-CM | POA: Diagnosis not present

## 2023-04-25 DIAGNOSIS — Z87891 Personal history of nicotine dependence: Secondary | ICD-10-CM | POA: Diagnosis not present

## 2023-04-25 DIAGNOSIS — S0990XA Unspecified injury of head, initial encounter: Secondary | ICD-10-CM | POA: Diagnosis not present

## 2023-04-25 DIAGNOSIS — Y33XXXA Other specified events, undetermined intent, initial encounter: Secondary | ICD-10-CM | POA: Diagnosis not present

## 2023-04-25 DIAGNOSIS — R0789 Other chest pain: Secondary | ICD-10-CM | POA: Diagnosis not present

## 2023-04-26 NOTE — Progress Notes (Signed)
Not seen

## 2023-05-31 ENCOUNTER — Other Ambulatory Visit: Payer: Self-pay | Admitting: Family Medicine

## 2023-05-31 DIAGNOSIS — F3341 Major depressive disorder, recurrent, in partial remission: Secondary | ICD-10-CM

## 2023-06-17 DIAGNOSIS — Z7901 Long term (current) use of anticoagulants: Secondary | ICD-10-CM | POA: Diagnosis not present

## 2023-06-17 DIAGNOSIS — E7800 Pure hypercholesterolemia, unspecified: Secondary | ICD-10-CM | POA: Diagnosis not present

## 2023-06-17 DIAGNOSIS — I255 Ischemic cardiomyopathy: Secondary | ICD-10-CM | POA: Diagnosis not present

## 2023-06-17 DIAGNOSIS — Z8673 Personal history of transient ischemic attack (TIA), and cerebral infarction without residual deficits: Secondary | ICD-10-CM | POA: Diagnosis not present

## 2023-06-17 DIAGNOSIS — I2510 Atherosclerotic heart disease of native coronary artery without angina pectoris: Secondary | ICD-10-CM | POA: Diagnosis not present

## 2023-06-17 DIAGNOSIS — I480 Paroxysmal atrial fibrillation: Secondary | ICD-10-CM | POA: Diagnosis not present

## 2023-06-17 DIAGNOSIS — I5042 Chronic combined systolic (congestive) and diastolic (congestive) heart failure: Secondary | ICD-10-CM | POA: Diagnosis not present

## 2023-06-19 ENCOUNTER — Telehealth: Payer: Self-pay | Admitting: Family Medicine

## 2023-06-19 NOTE — Telephone Encounter (Signed)
Copied from CRM [id number]. Topic: General - Other >> Jun 19, 2023  3:15 PM Turkey B wrote: Reason for CRM: pt called in asking to speak with Dr Yetta Barre, wouldn't say why

## 2023-06-19 NOTE — Telephone Encounter (Signed)
Called pt she was not at home. Husband answered the phone he stated he will have her to call us back. Dr. Yetta Barre is not in the office will return tomorrow morning. Please get more details on what the pts wants.   KP

## 2023-12-30 DIAGNOSIS — R001 Bradycardia, unspecified: Secondary | ICD-10-CM | POA: Diagnosis not present

## 2023-12-30 DIAGNOSIS — I440 Atrioventricular block, first degree: Secondary | ICD-10-CM | POA: Diagnosis not present

## 2023-12-30 DIAGNOSIS — I2510 Atherosclerotic heart disease of native coronary artery without angina pectoris: Secondary | ICD-10-CM | POA: Diagnosis not present

## 2023-12-30 DIAGNOSIS — I255 Ischemic cardiomyopathy: Secondary | ICD-10-CM | POA: Diagnosis not present

## 2023-12-30 DIAGNOSIS — K219 Gastro-esophageal reflux disease without esophagitis: Secondary | ICD-10-CM | POA: Diagnosis not present

## 2023-12-30 DIAGNOSIS — E7800 Pure hypercholesterolemia, unspecified: Secondary | ICD-10-CM | POA: Diagnosis not present

## 2023-12-30 DIAGNOSIS — I480 Paroxysmal atrial fibrillation: Secondary | ICD-10-CM | POA: Diagnosis not present

## 2023-12-30 DIAGNOSIS — I5042 Chronic combined systolic (congestive) and diastolic (congestive) heart failure: Secondary | ICD-10-CM | POA: Diagnosis not present

## 2023-12-30 DIAGNOSIS — R9431 Abnormal electrocardiogram [ECG] [EKG]: Secondary | ICD-10-CM | POA: Diagnosis not present

## 2023-12-30 DIAGNOSIS — I10 Essential (primary) hypertension: Secondary | ICD-10-CM | POA: Diagnosis not present

## 2023-12-30 DIAGNOSIS — I447 Left bundle-branch block, unspecified: Secondary | ICD-10-CM | POA: Diagnosis not present

## 2024-01-13 ENCOUNTER — Telehealth: Payer: Self-pay | Admitting: Family Medicine

## 2024-01-13 NOTE — Telephone Encounter (Signed)
 Copied from CRM [id number]. Topic: Medicare AWV >> Jan 13, 2024  8:57 AM Juliana Ocean wrote: Reason for CRM: Called 01/13/24 to r/s AWV due NHA in meeting. New AWV appt 03/19/2024 at 8:40am  Rosalee Collins; Care Guide Ambulatory Clinical Support [place] [facility] Direct Dial: [phone]

## 2024-01-14 ENCOUNTER — Ambulatory Visit (INDEPENDENT_AMBULATORY_CARE_PROVIDER_SITE_OTHER): Admitting: Family Medicine

## 2024-01-14 ENCOUNTER — Encounter: Payer: Self-pay | Admitting: Family Medicine

## 2024-01-14 VITALS — BP 124/68 | HR 59 | Ht 64.0 in | Wt 151.4 lb

## 2024-01-14 DIAGNOSIS — I2510 Atherosclerotic heart disease of native coronary artery without angina pectoris: Secondary | ICD-10-CM | POA: Diagnosis not present

## 2024-01-14 DIAGNOSIS — F3341 Major depressive disorder, recurrent, in partial remission: Secondary | ICD-10-CM

## 2024-01-14 DIAGNOSIS — K219 Gastro-esophageal reflux disease without esophagitis: Secondary | ICD-10-CM | POA: Diagnosis not present

## 2024-01-14 MED ORDER — OMEPRAZOLE 40 MG PO CPDR: 40.0000 mg | DELAYED_RELEASE_CAPSULE | Freq: Two times a day (BID) | ORAL | 1 refills | Status: AC

## 2024-01-14 MED ORDER — NITROGLYCERIN 0.4 MG SL SUBL: 0.4000 mg | SUBLINGUAL_TABLET | SUBLINGUAL | 2 refills | Status: AC | PRN

## 2024-01-14 MED ORDER — BUPROPION HCL 100 MG PO TABS: 100.0000 mg | ORAL_TABLET | Freq: Two times a day (BID) | ORAL | 1 refills | Status: AC

## 2024-01-14 NOTE — Progress Notes (Signed)
 Date:  01/14/2024   Name:  Veronica Ellis   DOB:  [birth year]   MRN:  [id number]   Chief Complaint: Medical Management of Chronic Issues (Patient presents today for a follow  up on her overall health. She is doing well and has no concerns for today's visit. )  Depression        This is a chronic problem.  The current episode started more than 1 year ago.   The onset quality is gradual.   The problem has been gradually improving since onset.  Associated symptoms include insomnia.  Associated symptoms include no decreased concentration, no fatigue, no helplessness, no hopelessness, not irritable, no restlessness, no appetite change, no indigestion, not sad and no suicidal ideas.( On tablet at/ night/ eating well/ )  Past treatments include other medications (bupropion ).  Compliance with treatment is good.  Previous treatment provided moderate relief.   Lab Results  Component Value Date   NA 141 04/22/2022   K 4.0 04/22/2022   CO2 25 04/22/2022   GLUCOSE 126 (H) 04/22/2022   BUN 19 04/22/2022   CREATININE 1.30 (H) 04/22/2022   CALCIUM  8.2 (L) 04/22/2022   GFRNONAA 40 (L) 04/22/2022   Lab Results  Component Value Date   CHOL 129 05/19/2020   HDL 52 05/19/2020   LDLCALC 56 05/19/2020   TRIG 120 05/19/2020   CHOLHDL 3.6 05/20/2018   Lab Results  Component Value Date   TSH 3.246 02/11/2022   Lab Results  Component Value Date   HGBA1C 5.4 05/19/2018   Lab Results  Component Value Date   WBC 7.4 04/26/2022   HGB 10.8 (L) 04/26/2022   HCT 33.1 (L) 04/26/2022   MCV 90 04/26/2022   PLT 360 04/26/2022   Lab Results  Component Value Date   ALT 28 04/21/2022   AST 23 04/21/2022   ALKPHOS 59 04/21/2022   BILITOT 0.5 04/21/2022   No results found for: "25OHVITD2", "25OHVITD3", "VD25OH"   Review of Systems  Constitutional:  Negative for appetite change and fatigue.  HENT:  Negative for facial swelling and trouble swallowing.   Eyes:  Negative for visual disturbance.   Respiratory:  Negative for chest tightness, shortness of breath and wheezing.   Cardiovascular:  Negative for chest pain, palpitations and leg swelling.  Gastrointestinal:  Negative for abdominal distention.  Endocrine: Negative for polydipsia and polyuria.  Psychiatric/Behavioral:  Positive for depression. Negative for decreased concentration and suicidal ideas. The patient has insomnia.     Patient Active Problem List   Diagnosis Date Noted   GI bleed 04/21/2022   Gross hematuria 04/21/2022   Essential hypertension 04/21/2022   Symptomatic ABLA due to GIB in the setting of supratherapeutic INR 02/09/2022   Supratherapeutic INR 02/09/2022   Goals of care, counseling/discussion 02/09/2022   Paroxysmal A-fib (HCC) 02/09/2022   Urinary tract infection 02/09/2022   Generalized weakness 02/09/2022   GI bleeding 02/08/2022   Coagulopathy/supratherapeutic INR on warfarin 02/08/2022   CKD-3A 02/08/2022   Dyslipidemia 02/08/2022   Depression 02/08/2022   Hypotension 02/08/2022   GERD without esophagitis 08/31/2019   History of IBS 09/26/2018   Long term (current) use of anticoagulants 07/17/2018   3-vessel CAD 07/08/2018   Acute on chronic combined systolic and diastolic CHF (congestive heart failure) (HCC) 07/08/2018   Ischemic cardiomyopathy 07/08/2018   AKI (acute kidney injury) (HCC) 06/06/2018   Hyperkalemia 06/05/2018   Atrial fibrillation with RVR (HCC) 05/21/2018   Symptomatic anemia 05/21/2018   NSTEMI (non-ST  elevated myocardial infarction) (HCC) 05/19/2018   Chronic systolic CHF (congestive heart failure) (HCC) 07/30/2017   LBBB (left bundle branch block) 07/10/2017   Lumbar radiculopathy 05/16/2017   Hx of low back pain 05/14/2017   Ataxia 12/31/2016   Memory loss 12/31/2016   Recurrent major depressive disorder, in partial remission (HCC) 12/31/2016   Bipolar 1 disorder, mixed, moderate (HCC) 12/25/2016   Chronic bilateral low back pain with bilateral sciatica  10/18/2016   Bilateral breast cancer (HCC) 02/20/2016   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   Cerebrovascular disease 02/16/2015   Anxiety and depression 02/16/2015   Breast lump 02/16/2015   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   Tobacco abuse, in remission 02/16/2015   Routine general medical examination at a health care facility 02/16/2015   Recurrent major depressive episodes (HCC) 02/16/2015   Below normal amount of sodium in the blood 02/16/2015   Pre-operative cardiovascular examination 02/16/2015    Allergies  Allergen Reactions   Iodine Nausea Only    Betadine okay    Erythromycin Rash   Other Rash   Penicillins Rash    Has patient had a PCN reaction causing immediate rash, facial/tongue/throat swelling, SOB or lightheadedness with hypotension: No Has patient had a PCN reaction causing severe rash involving mucus membranes or skin necrosis: No Has patient had a PCN reaction that required hospitalization: No Has patient had a PCN reaction occurring within the last 10 years: No If all of the above answers are "NO", then may proceed with Cephalosporin use.    Shellfish Allergy Rash    Past Surgical History:  Procedure Laterality Date   APPENDECTOMY     BREAST BIOPSY Bilateral 2014   BREAST LUMPECTOMY Bilateral 05/2013   BREAST SURGERY Bilateral    CATARACT EXTRACTION W/PHACO Right 12/11/2017   Procedure: CATARACT EXTRACTION PHACO AND INTRAOCULAR LENS PLACEMENT (IOC) COMPLICATED RIGHT;  Surgeon: Annell Kidney, MD;  Location: [facility];  Service: Ophthalmology;  Laterality: Right;   COLON SURGERY     12 in. removed due to polyps   COLONOSCOPY WITH PROPOFOL  N/A 02/10/2022   Procedure: COLONOSCOPY WITH PROPOFOL ;  Surgeon: Selena Daily, MD;  Location[facility] END[facility];  Service: Gastroenterology;  Laterality: N/A;   ESOPHAGOGASTRODUODENOSCOPY (EGD) WITH PROPOFOL  N/A 02/09/2022   Procedure: ESOPHAGOGASTRODUODENOSCOPY (EGD) WITH  PROPOFOL ;  Surgeon: Luke Salaam, MD;  Location: [facility] ENDOSCOPY;  Service: Gastroenterology;  Laterality: N/A;   GIVENS CAPSULE STUDY N/A 02/11/2022   Procedure: GIVENS CAPSULE STUDY;  Surgeon: Selena Daily, MD;  Location[facility] END[facility];  Service: Gastroenterology;  Laterality: N/A;   LEFT HEART CATH AND CORONARY ANGIOGRAPHY N/A 05/20/2018   Procedure: LEFT HEART CATH AND CORONARY ANGIOGRAPHY;  Surgeon: Michelle Aid, MD;  Location: [facility] INVASIVE CV LAB;  Service: Cardiovascular;  Laterality: N/A;   TUBAL LIGATION      Social History   Tobacco Use   Smoking status: Former    Current packs/day: 0.00    Average packs/day: 1 pack/day for 60.0 years (60.0 ttl pk-yrs)    Types: Cigarettes    Start date: 12/1957    Quit date: 12/2017    Years since quitting: 6.1   Smokeless tobacco: Never   Tobacco comments:    smoking cessation materials not required  Vaping Use   Vaping status: Never Used  Substance Use Topics   Alcohol use: Not Currently    Alcohol/week: 0.0 standard drinks of alcohol   Drug use: No     Medication list has been reviewed and updated.  Current Meds  Medication Sig   apixaban  (ELIQUIS ) 5 MG TABS tablet Take 1 tablet (5 mg total) by mouth 2 (two) times daily.   atorvastatin  (LIPITOR ) 80 MG tablet Take 80 mg by mouth at bedtime.   buPROPion  (WELLBUTRIN ) 100 MG tablet TAKE 1 TABLET BY MOUTH TWICE A DAY   ezetimibe  (ZETIA ) 10 MG tablet Take by mouth. Take 1 tablet (10 mg total) by mouth once daily   metoprolol  succinate (TOPROL -XL) 25 MG 24 hr tablet Take 0.5 tablets (12.5 mg total) by mouth daily. (Patient taking differently: Take 25 mg by mouth daily.)   sacubitril -valsartan  (ENTRESTO ) 24-26 MG Take 1 tablet by mouth 2 (two) times daily.   spironolactone  (ALDACTONE ) 25 MG tablet Take 25 mg by mouth at bedtime.       01/14/2024   10:50 AM 12/24/2022    1:48 PM 07/03/2022    1:19 PM 04/26/2022    2:49 PM  GAD 7 : Generalized Anxiety Score  Nervous,  Anxious, on Edge 0 0 0 1  Control/stop worrying 0 0 0 1  Worry too much - different things 0 0 0 1  Trouble relaxing 0 0 0 0  Restless 0 0 0 0  Easily annoyed or irritable 0 0 0 0  Afraid - awful might happen 0 0 0 0  Total GAD 7 Score 0 0 0 3  Anxiety Difficulty Not difficult at all Not difficult at all Not difficult at all Not difficult at all       01/14/2024   10:50 AM 04/25/2023    1:33 PM 01/09/2023    8:53 AM  Depression screen PHQ 2/9  Decreased Interest 3 0 0  Down, Depressed, Hopeless 0 0 0  PHQ - 2 Score 3 0 0  Altered sleeping 3  0  Tired, decreased energy 3  0  Change in appetite 3  0  Feeling bad or failure about yourself  0  0  Trouble concentrating 0  0  Moving slowly or fidgety/restless 0  0  Suicidal thoughts 0  0  PHQ-9 Score 12  0  Difficult doing work/chores Not difficult at all  Not difficult at all    BP Readings from Last 3 Encounters:  01/14/24 124/68  04/25/23 118/68  12/24/22 118/62    Physical Exam Vitals and nursing note reviewed.  Constitutional:      General: She is not irritable.She is not in acute distress.    Appearance: She is not diaphoretic.  HENT:     Head: Normocephalic and atraumatic.     Right Ear: Tympanic membrane, ear canal and external ear normal.     Left Ear: Tympanic membrane, ear canal and external ear normal.     Nose: Nose normal.     Mouth/Throat:     Mouth: Mucous membranes are moist.  Eyes:     General:        Right eye: No discharge.        Left eye: No discharge.     Conjunctiva/sclera: Conjunctivae normal.     Pupils: Pupils are equal, round, and reactive to light.  Neck:     Thyroid: No thyromegaly.     Vascular: No JVD.  Cardiovascular:     Rate and Rhythm: Normal rate and regular rhythm.     Heart sounds: Normal heart sounds. No murmur heard.    No friction rub. No gallop.  Pulmonary:     Effort: Pulmonary effort is normal.  Breath sounds: Normal breath sounds. No wheezing, rhonchi or rales.   Abdominal:     General: Bowel sounds are normal.     Palpations: Abdomen is soft. There is no mass.     Tenderness: There is no abdominal tenderness. There is no guarding or rebound.  Musculoskeletal:        General: Normal range of motion.     Cervical back: Normal range of motion and neck supple.  Lymphadenopathy:     Cervical: No cervical adenopathy.  Skin:    General: Skin is warm and dry.  Neurological:     Mental Status: She is alert.     Deep Tendon Reflexes: Reflexes are normal and symmetric.     Wt Readings from Last 3 Encounters:  01/14/24 151 lb 6.4 oz (68.7 kg)  04/25/23 157 lb (71.2 kg)  01/09/23 162 lb (73.5 kg)    BP 124/68   Pulse (!) 59   Ht 5\' 4"  (1.626 m)   Wt 151 lb 6.4 oz (68.7 kg)   SpO2 97%   BMI 25.99 kg/m   Assessment and Plan: 1. Recurrent major depressive disorder, in partial remission (HCC) (Primary) Recurrent.  Controlled.  Stable.  Even though PHQ is 12 the sleep is because she stays up playing on her tablet, the appetite is not depressed actually she is hungry but she is eating junk per caretaker, and fatigue during the day is probably somnolence noted because of self-imposed sleep deprivation due to being on her tablet.  Patient will continue bupropion  100 mg twice a day and we will recheck patient in 6 months. - buPROPion  (WELLBUTRIN ) 100 MG tablet; Take 1 tablet (100 mg total) by mouth 2 (two) times daily.  Dispense: 180 tablet; Refill: 1  2. GERD without esophagitis Chronic.  Controlled.  Stable.  Asymptomatic.  On current dosing of omeprazole  40 mg twice a day. - omeprazole  (PRILOSEC) 40 MG capsule; Take 1 capsule (40 mg total) by mouth 2 (two) times daily.  Dispense: 180 capsule; Refill: 1  3. 3-vessel CAD Chronic.  Controlled.  Stable.  She is followed by cardiology.  She is getting low on her Nitrostat  sublingual medication and needs a refill and that she forgot to ask her cardiologist at last visit.  We will provide. - nitroGLYCERIN   (NITROSTAT ) 0.4 MG SL tablet; Place 1 tablet (0.4 mg total) under the tongue every 5 (five) minutes as needed for chest pain.  Dispense: 30 tablet; Refill: 2     Alayne Allis, MD

## 2024-03-19 ENCOUNTER — Encounter

## 2024-04-16 ENCOUNTER — Other Ambulatory Visit: Payer: Self-pay

## 2024-04-16 DIAGNOSIS — I2510 Atherosclerotic heart disease of native coronary artery without angina pectoris: Secondary | ICD-10-CM

## 2024-07-20 ENCOUNTER — Encounter: Admitting: Family Medicine

## 2024-08-30 ENCOUNTER — Emergency Department
Admission: EM | Admit: 2024-08-30 | Discharge: 2024-08-30 | Disposition: A | Discharge status: Home / Self Care | Attending: Emergency Medicine | Admitting: Emergency Medicine

## 2024-08-30 ENCOUNTER — Other Ambulatory Visit: Payer: Self-pay

## 2024-08-30 ENCOUNTER — Emergency Department

## 2024-08-30 ENCOUNTER — Encounter: Payer: Self-pay | Admitting: Emergency Medicine

## 2024-08-30 DIAGNOSIS — N189 Chronic kidney disease, unspecified: Secondary | ICD-10-CM | POA: Insufficient documentation

## 2024-08-30 DIAGNOSIS — I482 Chronic atrial fibrillation, unspecified: Secondary | ICD-10-CM | POA: Diagnosis not present

## 2024-08-30 DIAGNOSIS — Z853 Personal history of malignant neoplasm of breast: Secondary | ICD-10-CM | POA: Diagnosis not present

## 2024-08-30 DIAGNOSIS — Z955 Presence of coronary angioplasty implant and graft: Secondary | ICD-10-CM | POA: Diagnosis not present

## 2024-08-30 DIAGNOSIS — R519 Headache, unspecified: Secondary | ICD-10-CM | POA: Insufficient documentation

## 2024-08-30 DIAGNOSIS — R42 Dizziness and giddiness: Secondary | ICD-10-CM

## 2024-08-30 DIAGNOSIS — M25512 Pain in left shoulder: Secondary | ICD-10-CM | POA: Diagnosis not present

## 2024-08-30 DIAGNOSIS — M542 Cervicalgia: Secondary | ICD-10-CM | POA: Insufficient documentation

## 2024-08-30 DIAGNOSIS — I129 Hypertensive chronic kidney disease with stage 1 through stage 4 chronic kidney disease, or unspecified chronic kidney disease: Secondary | ICD-10-CM | POA: Diagnosis not present

## 2024-08-30 LAB — CBC
HCT: 39.6 % (ref 36.0–46.0)
Hemoglobin: 12.8 g/dL (ref 12.0–15.0)
MCH: 30.3 pg (ref 26.0–34.0)
MCHC: 32.3 g/dL (ref 30.0–36.0)
MCV: 93.6 fL (ref 80.0–100.0)
Platelets: 320 K/uL (ref 150–400)
RBC: 4.23 MIL/uL (ref 3.87–5.11)
RDW: 12.9 % (ref 11.5–15.5)
WBC: 9.2 K/uL (ref 4.0–10.5)
nRBC: 0 % (ref 0.0–0.2)

## 2024-08-30 LAB — COMPREHENSIVE METABOLIC PANEL WITH GFR
ALT: 22 U/L (ref 0–44)
AST: 20 U/L (ref 15–41)
Albumin: 3.7 g/dL (ref 3.5–5.0)
Alkaline Phosphatase: 88 U/L (ref 38–126)
Anion gap: 10 (ref 5–15)
BUN: 19 mg/dL (ref 8–23)
CO2: 24 mmol/L (ref 22–32)
Calcium: 9 mg/dL (ref 8.9–10.3)
Chloride: 105 mmol/L (ref 98–111)
Creatinine, Ser: 1.2 mg/dL — ABNORMAL HIGH (ref 0.44–1.00)
GFR, Estimated: 43 mL/min — ABNORMAL LOW
Glucose, Bld: 102 mg/dL — ABNORMAL HIGH (ref 70–99)
Potassium: 4.5 mmol/L (ref 3.5–5.1)
Sodium: 139 mmol/L (ref 135–145)
Total Bilirubin: 0.6 mg/dL (ref 0.0–1.2)
Total Protein: 7 g/dL (ref 6.5–8.1)

## 2024-08-30 LAB — TYPE AND SCREEN
ABO/RH(D): O POS
Antibody Screen: NEGATIVE

## 2024-08-30 LAB — PROTIME-INR
INR: 1.1 (ref 0.8–1.2)
Prothrombin Time: 14.4 s (ref 11.4–15.2)

## 2024-08-30 MED ORDER — ACETAMINOPHEN 500 MG PO TABS
1000.0000 mg | ORAL_TABLET | Freq: Once | ORAL | Status: AC
  Administered 2024-08-30: 1000 mg via ORAL
  Filled 2024-08-30: qty 2

## 2024-08-30 MED ORDER — IOHEXOL 350 MG/ML SOLN
75.0000 mL | Freq: Once | INTRAVENOUS | Status: AC | PRN
  Administered 2024-08-30: 75 mL via INTRAVENOUS

## 2024-08-30 MED ORDER — ONDANSETRON HCL 4 MG/2ML IJ SOLN
4.0000 mg | Freq: Once | INTRAMUSCULAR | Status: AC
  Administered 2024-08-30: 4 mg via INTRAVENOUS
  Filled 2024-08-30: qty 2

## 2024-08-30 NOTE — ED Triage Notes (Signed)
 Pt endorses dark tarry stool and left shoulder pain for 4 days. Also reports intermittent sharp abd pains. Pt on eliquis .

## 2024-08-30 NOTE — ED Provider Notes (Signed)
 "  [facility] Provider Note    Event Date/Time   First MD Initiated Contact with Patient 08/30/24 1521     (approximate)   History   Chief Complaint: Blood In Stools   HPI  Veronica Ellis is a 89 y.o. female with a history of anxiety, IBS, hypertension, CKD, bipolar disorder who comes to the ED complaining of pain in the left shoulder, left neck and headache for the past few days.  Rated as severe, not a typical headache pattern for her.  No trauma or fever.  Also reports dark stool and a history of GI bleed.  No dizziness chest pain or shortness of breath.        Past Medical History:  Diagnosis Date   Anxiety    Anxiety    Arthritis    Breast cancer (HCC) 2014   bilateral invasive mammary carcinoma. with 36 rad tx.    Breast cancer (HCC)    Cancer (HCC)    breast   Depression    Depression    Hard of hearing    Hyperlipemia    Hypertension    IBS (irritable bowel syndrome)    Memory loss    Osteoporosis    Personal history of radiation therapy    Wears dentures    full upper, partial lower    Current Outpatient Rx   Order #: [id number] Class: Normal   Order #: [id number] Class: Historical Med   Order #: [id number] Class: Normal   Order #: [id number] Class: Historical Med   Order #: [id number] Class: No Print   Order #: [id number] Class: Normal   Order #: [id number] Class: Normal   Order #: [id number] Class: Historical Med   Order #: [id number] Class: Historical Med   Order #: [id number] Class: Historical Med    Past Surgical History:  Procedure Laterality Date   APPENDECTOMY     BREAST BIOPSY Bilateral 2014   BREAST LUMPECTOMY Bilateral 05/2013   BREAST SURGERY Bilateral    CATARACT EXTRACTION W/PHACO Right 12/11/2017   Procedure: CATARACT EXTRACTION PHACO AND INTRAOCULAR LENS PLACEMENT (IOC) COMPLICATED RIGHT;  Surgeon: Mittie Gaskin, MD;  Location: [facility];  Service: Ophthalmology;  Laterality: Right;   COLON SURGERY      12 in. removed due to polyps   COLONOSCOPY WITH PROPOFOL  N/A 02/10/2022   Procedure: COLONOSCOPY WITH PROPOFOL ;  Surgeon: Unk Corinn Skiff, MD;  Location: [facility] ENDOSCOPY;  Service: Gastroenterology;  Laterality: N/A;   ESOPHAGOGASTRODUODENOSCOPY (EGD) WITH PROPOFOL  N/A 02/09/2022   Procedure: ESOPHAGOGASTRODUODENOSCOPY (EGD) WITH PROPOFOL ;  Surgeon: Therisa Bi, MD;  Location: [facility] ENDOSCOPY;  Service: Gastroenterology;  Laterality: N/A;   GIVENS CAPSULE STUDY N/A 02/11/2022   Procedure: GIVENS CAPSULE STUDY;  Surgeon: Unk Corinn Skiff, MD;  Location: [facility] ENDOSCOPY;  Service: Gastroenterology;  Laterality: N/A;   LEFT HEART CATH AND CORONARY ANGIOGRAPHY N/A 05/20/2018   Procedure: LEFT HEART CATH AND CORONARY ANGIOGRAPHY;  Surgeon: Hester Wolm PARAS, MD;  Location: [facility] INVASIVE CV LAB;  Service: Cardiovascular;  Laterality: N/A;   TUBAL LIGATION      Physical Exam   Triage Vital Signs: ED Triage Vitals  Encounter Vitals Group     BP 08/30/24 1130 (!) 140/88     Girls Systolic BP Percentile --      Girls Diastolic BP Percentile --      Boys Systolic BP Percentile --      Boys Diastolic BP Percentile --      Pulse Rate 08/30/24 1130 70     Resp 08/30/24 1130 20  Temp 08/30/24 1130 97.7 F (36.5 C)     Temp Source 08/30/24 1130 Oral     SpO2 08/30/24 1130 96 %     Weight 08/30/24 1131 151 lb 7.3 oz (68.7 kg)     Height 08/30/24 1131 5' 4 (1.626 m)     Head Circumference --      Peak Flow --      Pain Score --      Pain Loc --      Pain Education --      Exclude from Growth Chart --     Most recent vital signs: Vitals:   08/30/24 1704 08/30/24 1924  BP:    Pulse:    Resp:    Temp: 97.8 F (36.6 C)   SpO2:  94%    General: Awake, no distress.  CV:  Good peripheral perfusion.  Regular rate Resp:  Normal effort.  Clear lungs Abd:  No distention.  Soft nontender.  Rectal exam performed with nurse at bedside, Hemoccult negative Other:  Cranial nerves III through XII  intact.  No focal neurodeficits   ED Results / Procedures / Treatments   Labs (all labs ordered are listed, but only abnormal results are displayed) Labs Reviewed  COMPREHENSIVE METABOLIC PANEL WITH GFR - Abnormal; Notable for the following components:      Result Value   Glucose, Bld 102 (*)    Creatinine, Ser 1.20 (*)    GFR, Estimated 43 (*)    All other components within normal limits  CBC  PROTIME-INR  POC OCCULT BLOOD, ED  TYPE AND SCREEN     EKG    RADIOLOGY X-ray left shoulder interpreted by me, unremarkable.  Radiology report reviewed   PROCEDURES:  Procedures   MEDICATIONS ORDERED IN ED: Medications  ondansetron  (ZOFRAN ) injection 4 mg (4 mg Intravenous Given 08/30/24 1620)  iohexol  (OMNIPAQUE ) 350 MG/ML injection 75 mL (75 mLs Intravenous Contrast Given 08/30/24 1625)  acetaminophen  (TYLENOL ) tablet 1,000 mg (1,000 mg Oral Given 08/30/24 1744)     IMPRESSION / MDM / ASSESSMENT AND PLAN / ED COURSE  I reviewed the triage vital signs and the nursing notes.  DDx: Intracranial hemorrhage, cerebral aneurysm, ischemic stroke, anemia, AKI, electrolyte derangement  Patient's presentation is most consistent with acute presentation with potential threat to life or bodily function.     Clinical Course as of 08/31/24 0013  Sun Aug 30, 2024  1615 Presents with black stool , Worried about GI bleed in the setting of Eliquis  use for her chronic A-fib.  CBC is normal, Hemoccult negative.  Also reports severe headaches ongoing for the past few weeks along with possible vision changes and neck pain.  Patient and family are not able to provide a clear and precise history.  Will need to obtain a CT angiogram of the head neck. [PS]  1925 CT angio and MRI brain all unremarkable, no acute findings.  No evidence of aneurysm or acute stroke.  She is feeling better after Tylenol .  Eager for discharge home and stable to do so.  She will follow-up with her doctor regarding  blood pressure monitoring. [PS]    Clinical Course User Index [PS] Viviann Pastor, MD     FINAL CLINICAL IMPRESSION(S) / ED DIAGNOSES   Final diagnoses:  Nonintractable episodic headache, unspecified headache type  Dizziness  Chronic atrial fibrillation (HCC)     Rx / DC Orders   ED Discharge Orders     None  Note:  This document was prepared using Dragon voice recognition software and may include unintentional dictation errors.   Viviann Pastor, MD 08/31/24 [phone]  "
